# Patient Record
Sex: Male | Born: 1935 | Race: White | Hispanic: No | Smoking: Former smoker
Health system: Southern US, Community
[De-identification: ages and names within clinical notes are randomized; demographics above are authoritative.]

## PROBLEM LIST (undated history)

## (undated) DIAGNOSIS — K259 Gastric ulcer, unspecified as acute or chronic, without hemorrhage or perforation: Secondary | ICD-10-CM

## (undated) DIAGNOSIS — I219 Acute myocardial infarction, unspecified: Secondary | ICD-10-CM

## (undated) DIAGNOSIS — I10 Essential (primary) hypertension: Secondary | ICD-10-CM

## (undated) DIAGNOSIS — I714 Abdominal aortic aneurysm, without rupture, unspecified: Secondary | ICD-10-CM

## (undated) DIAGNOSIS — I77810 Thoracic aortic ectasia: Secondary | ICD-10-CM

## (undated) DIAGNOSIS — Z720 Tobacco use: Secondary | ICD-10-CM

## (undated) DIAGNOSIS — J449 Chronic obstructive pulmonary disease, unspecified: Secondary | ICD-10-CM

## (undated) DIAGNOSIS — K631 Perforation of intestine (nontraumatic): Secondary | ICD-10-CM

## (undated) DIAGNOSIS — Z86718 Personal history of other venous thrombosis and embolism: Secondary | ICD-10-CM

## (undated) DIAGNOSIS — D45 Polycythemia vera: Secondary | ICD-10-CM

## (undated) DIAGNOSIS — I255 Ischemic cardiomyopathy: Secondary | ICD-10-CM

## (undated) DIAGNOSIS — K219 Gastro-esophageal reflux disease without esophagitis: Secondary | ICD-10-CM

## (undated) DIAGNOSIS — K659 Peritonitis, unspecified: Secondary | ICD-10-CM

## (undated) DIAGNOSIS — I2510 Atherosclerotic heart disease of native coronary artery without angina pectoris: Secondary | ICD-10-CM

## (undated) DIAGNOSIS — I724 Aneurysm of artery of lower extremity: Secondary | ICD-10-CM

## (undated) DIAGNOSIS — I5022 Chronic systolic (congestive) heart failure: Secondary | ICD-10-CM

## (undated) DIAGNOSIS — J189 Pneumonia, unspecified organism: Secondary | ICD-10-CM

## (undated) DIAGNOSIS — I2699 Other pulmonary embolism without acute cor pulmonale: Secondary | ICD-10-CM

## (undated) DIAGNOSIS — J9610 Chronic respiratory failure, unspecified whether with hypoxia or hypercapnia: Secondary | ICD-10-CM

## (undated) HISTORY — DX: Aneurysm of artery of lower extremity: I72.4

## (undated) HISTORY — DX: Thoracic aortic ectasia: I77.810

## (undated) HISTORY — DX: Other pulmonary embolism without acute cor pulmonale: I26.99

## (undated) HISTORY — DX: Atherosclerotic heart disease of native coronary artery without angina pectoris: I25.10

## (undated) HISTORY — PX: SHOULDER ARTHROSCOPY: SHX128

## (undated) HISTORY — PX: COLON SURGERY: SHX602

## (undated) HISTORY — DX: Perforation of intestine (nontraumatic): K63.1

## (undated) HISTORY — DX: Abdominal aortic aneurysm, without rupture: I71.4

## (undated) HISTORY — DX: Gastric ulcer, unspecified as acute or chronic, without hemorrhage or perforation: K25.9

## (undated) HISTORY — PX: CARDIAC DEFIBRILLATOR PLACEMENT: SHX171

## (undated) HISTORY — DX: Abdominal aortic aneurysm, without rupture, unspecified: I71.40

## (undated) HISTORY — DX: Peritonitis, unspecified: K65.9

## (undated) HISTORY — DX: Chronic respiratory failure, unspecified whether with hypoxia or hypercapnia: J96.10

## (undated) HISTORY — DX: Tobacco use: Z72.0

## (undated) HISTORY — DX: Pneumonia, unspecified organism: J18.9

## (undated) HISTORY — DX: Polycythemia vera: D45

## (undated) HISTORY — DX: Chronic systolic (congestive) heart failure: I50.22

---

## 1998-09-17 ENCOUNTER — Ambulatory Visit (HOSPITAL_COMMUNITY): Admission: RE | Admit: 1998-09-17 | Discharge: 1998-09-17 | Payer: Self-pay | Admitting: *Deleted

## 1999-06-29 ENCOUNTER — Encounter: Payer: Self-pay | Admitting: Critical Care Medicine

## 1999-06-29 ENCOUNTER — Ambulatory Visit (HOSPITAL_COMMUNITY): Admission: RE | Admit: 1999-06-29 | Discharge: 1999-06-29 | Payer: Self-pay | Admitting: Critical Care Medicine

## 1999-08-11 ENCOUNTER — Ambulatory Visit (HOSPITAL_COMMUNITY): Admission: RE | Admit: 1999-08-11 | Discharge: 1999-08-11 | Payer: Self-pay | Admitting: Oncology

## 2000-04-02 ENCOUNTER — Emergency Department (HOSPITAL_COMMUNITY): Admission: EM | Admit: 2000-04-02 | Discharge: 2000-04-02 | Payer: Self-pay | Admitting: Emergency Medicine

## 2000-07-08 ENCOUNTER — Encounter: Payer: Self-pay | Admitting: Vascular Surgery

## 2000-07-08 ENCOUNTER — Emergency Department (HOSPITAL_COMMUNITY): Admission: EM | Admit: 2000-07-08 | Discharge: 2000-07-08 | Payer: Self-pay | Admitting: Emergency Medicine

## 2000-07-08 ENCOUNTER — Ambulatory Visit (HOSPITAL_COMMUNITY): Admission: RE | Admit: 2000-07-08 | Discharge: 2000-07-08 | Payer: Self-pay

## 2003-10-13 ENCOUNTER — Encounter (INDEPENDENT_AMBULATORY_CARE_PROVIDER_SITE_OTHER): Payer: Self-pay | Admitting: Specialist

## 2003-10-13 ENCOUNTER — Ambulatory Visit (HOSPITAL_COMMUNITY): Admission: RE | Admit: 2003-10-13 | Discharge: 2003-10-13 | Payer: Self-pay | Admitting: Gastroenterology

## 2004-05-20 ENCOUNTER — Ambulatory Visit (HOSPITAL_COMMUNITY): Admission: RE | Admit: 2004-05-20 | Discharge: 2004-05-20 | Payer: Self-pay | Admitting: Oncology

## 2004-06-02 ENCOUNTER — Ambulatory Visit (HOSPITAL_COMMUNITY): Admission: RE | Admit: 2004-06-02 | Discharge: 2004-06-02 | Payer: Self-pay | Admitting: Oncology

## 2004-10-13 ENCOUNTER — Ambulatory Visit: Payer: Self-pay | Admitting: Oncology

## 2004-12-08 ENCOUNTER — Ambulatory Visit: Payer: Self-pay | Admitting: Oncology

## 2005-02-01 ENCOUNTER — Ambulatory Visit: Payer: Self-pay | Admitting: Oncology

## 2005-03-21 ENCOUNTER — Ambulatory Visit: Payer: Self-pay | Admitting: Oncology

## 2005-05-06 ENCOUNTER — Ambulatory Visit: Payer: Self-pay | Admitting: Oncology

## 2005-07-12 ENCOUNTER — Ambulatory Visit: Payer: Self-pay | Admitting: Oncology

## 2005-08-29 ENCOUNTER — Ambulatory Visit: Payer: Self-pay | Admitting: Oncology

## 2005-10-27 ENCOUNTER — Ambulatory Visit: Payer: Self-pay | Admitting: Oncology

## 2005-12-14 ENCOUNTER — Ambulatory Visit: Payer: Self-pay | Admitting: Oncology

## 2006-02-09 ENCOUNTER — Ambulatory Visit: Payer: Self-pay | Admitting: Oncology

## 2006-02-10 LAB — CBC WITH DIFFERENTIAL/PLATELET
Basophils Absolute: 0.1 10*3/uL (ref 0.0–0.1)
Eosinophils Absolute: 0.1 10*3/uL (ref 0.0–0.5)
HGB: 15.3 g/dL (ref 13.0–17.1)
LYMPH%: 11.8 % — ABNORMAL LOW (ref 14.0–48.0)
MCV: 71 fL — ABNORMAL LOW (ref 81.6–98.0)
MONO#: 0.8 10*3/uL (ref 0.1–0.9)
MONO%: 6.6 % (ref 0.0–13.0)
NEUT#: 10 10*3/uL — ABNORMAL HIGH (ref 1.5–6.5)
Platelets: 226 10*3/uL (ref 145–400)
RBC: 6.73 10*6/uL — ABNORMAL HIGH (ref 4.20–5.71)
RDW: 16.6 % — ABNORMAL HIGH (ref 11.2–14.6)
WBC: 12.5 10*3/uL — ABNORMAL HIGH (ref 4.0–10.0)

## 2006-02-10 LAB — PROTIME-INR
INR: 1.2 — ABNORMAL LOW (ref 2.00–3.50)
Protime: 13.4 Seconds (ref 10.6–13.4)

## 2006-02-17 LAB — CBC WITH DIFFERENTIAL/PLATELET
BASO%: 0.7 % (ref 0.0–2.0)
Basophils Absolute: 0.1 10*3/uL (ref 0.0–0.1)
EOS%: 1.3 % (ref 0.0–7.0)
HCT: 51 % — ABNORMAL HIGH (ref 38.7–49.9)
HGB: 16.5 g/dL (ref 13.0–17.1)
LYMPH%: 16.7 % (ref 14.0–48.0)
MCH: 22.8 pg — ABNORMAL LOW (ref 28.0–33.4)
MCHC: 32.3 g/dL (ref 32.0–35.9)
MONO#: 0.8 10*3/uL (ref 0.1–0.9)
NEUT%: 73.7 % (ref 40.0–75.0)
Platelets: 222 10*3/uL (ref 145–400)
lymph#: 1.7 10*3/uL (ref 0.9–3.3)

## 2006-02-24 LAB — CBC WITH DIFFERENTIAL/PLATELET
BASO%: 0.4 % (ref 0.0–2.0)
Basophils Absolute: 0.1 10*3/uL (ref 0.0–0.1)
EOS%: 1.3 % (ref 0.0–7.0)
HCT: 50.9 % — ABNORMAL HIGH (ref 38.7–49.9)
HGB: 16.4 g/dL (ref 13.0–17.1)
MCH: 22.7 pg — ABNORMAL LOW (ref 28.0–33.4)
MCHC: 32.3 g/dL (ref 32.0–35.9)
MCV: 70.2 fL — ABNORMAL LOW (ref 81.6–98.0)
MONO%: 6.8 % (ref 0.0–13.0)
NEUT%: 76.8 % — ABNORMAL HIGH (ref 40.0–75.0)
lymph#: 2 10*3/uL (ref 0.9–3.3)

## 2006-03-03 LAB — CBC WITH DIFFERENTIAL/PLATELET
Basophils Absolute: 0.1 10*3/uL (ref 0.0–0.1)
Eosinophils Absolute: 0.1 10*3/uL (ref 0.0–0.5)
HGB: 16.1 g/dL (ref 13.0–17.1)
MONO#: 0.9 10*3/uL (ref 0.1–0.9)
NEUT#: 4.9 10*3/uL (ref 1.5–6.5)
RBC: 7.03 10*6/uL — ABNORMAL HIGH (ref 4.20–5.71)
RDW: 16.7 % — ABNORMAL HIGH (ref 11.2–14.6)
WBC: 7.3 10*3/uL (ref 4.0–10.0)

## 2006-03-03 LAB — PROTIME-INR
INR: 1.4 — ABNORMAL LOW (ref 2.00–3.50)
Protime: 14.8 Seconds — ABNORMAL HIGH (ref 10.6–13.4)

## 2006-03-10 LAB — CBC WITH DIFFERENTIAL/PLATELET
Basophils Absolute: 0.3 10*3/uL — ABNORMAL HIGH (ref 0.0–0.1)
EOS%: 1.8 % (ref 0.0–7.0)
Eosinophils Absolute: 0.2 10*3/uL (ref 0.0–0.5)
HGB: 16.3 g/dL (ref 13.0–17.1)
LYMPH%: 11.8 % — ABNORMAL LOW (ref 14.0–48.0)
MCH: 22.3 pg — ABNORMAL LOW (ref 28.0–33.4)
MCV: 71.3 fL — ABNORMAL LOW (ref 81.6–98.0)
MONO%: 11 % (ref 0.0–13.0)
NEUT#: 6.8 10*3/uL — ABNORMAL HIGH (ref 1.5–6.5)
Platelets: 197 10*3/uL (ref 145–400)
RBC: 7.28 10*6/uL — ABNORMAL HIGH (ref 4.20–5.71)

## 2006-03-17 LAB — CBC WITH DIFFERENTIAL/PLATELET
Basophils Absolute: 0.1 10*3/uL (ref 0.0–0.1)
Eosinophils Absolute: 0.1 10*3/uL (ref 0.0–0.5)
HGB: 16.1 g/dL (ref 13.0–17.1)
MONO#: 0.9 10*3/uL (ref 0.1–0.9)
NEUT#: 6.8 10*3/uL — ABNORMAL HIGH (ref 1.5–6.5)
RDW: 17.3 % — ABNORMAL HIGH (ref 11.2–14.6)
lymph#: 1.8 10*3/uL (ref 0.9–3.3)

## 2006-03-17 LAB — PROTIME-INR: INR: 3.1 (ref 2.00–3.50)

## 2006-03-24 LAB — CBC WITH DIFFERENTIAL/PLATELET
BASO%: 0.7 % (ref 0.0–2.0)
MCHC: 31.1 g/dL — ABNORMAL LOW (ref 32.0–35.9)
MONO#: 0.8 10*3/uL (ref 0.1–0.9)
RBC: 7.29 10*6/uL — ABNORMAL HIGH (ref 4.20–5.71)
WBC: 9.7 10*3/uL (ref 4.0–10.0)
lymph#: 2 10*3/uL (ref 0.9–3.3)

## 2006-03-24 LAB — PROTIME-INR
INR: 3.4 (ref 2.00–3.50)
Protime: 22.3 Seconds — ABNORMAL HIGH (ref 10.6–13.4)

## 2006-03-29 ENCOUNTER — Ambulatory Visit: Payer: Self-pay | Admitting: Oncology

## 2006-03-31 LAB — PROTIME-INR: Protime: 21.3 Seconds — ABNORMAL HIGH (ref 10.6–13.4)

## 2006-03-31 LAB — CBC WITH DIFFERENTIAL/PLATELET
BASO%: 0.4 % (ref 0.0–2.0)
LYMPH%: 21.1 % (ref 14.0–48.0)
MCHC: 31.9 g/dL — ABNORMAL LOW (ref 32.0–35.9)
MONO#: 0.8 10*3/uL (ref 0.1–0.9)
RBC: 7.33 10*6/uL — ABNORMAL HIGH (ref 4.20–5.71)
WBC: 8.7 10*3/uL (ref 4.0–10.0)
lymph#: 1.8 10*3/uL (ref 0.9–3.3)

## 2006-04-05 LAB — CBC WITH DIFFERENTIAL/PLATELET
BASO%: 0.7 % (ref 0.0–2.0)
EOS%: 0.8 % (ref 0.0–7.0)
LYMPH%: 11.9 % — ABNORMAL LOW (ref 14.0–48.0)
MCHC: 31 g/dL — ABNORMAL LOW (ref 32.0–35.9)
MONO#: 0.7 10*3/uL (ref 0.1–0.9)
Platelets: 205 10*3/uL (ref 145–400)
RBC: 7.32 10*6/uL — ABNORMAL HIGH (ref 4.20–5.71)
WBC: 10.2 10*3/uL — ABNORMAL HIGH (ref 4.0–10.0)

## 2006-04-05 LAB — PROTIME-INR

## 2006-04-07 LAB — PROTIME-INR
INR: 2.5 (ref 2.00–3.50)
Protime: 19.2 Seconds — ABNORMAL HIGH (ref 10.6–13.4)

## 2006-04-07 LAB — CBC WITH DIFFERENTIAL/PLATELET
BASO%: 0.7 % (ref 0.0–2.0)
EOS%: 2.3 % (ref 0.0–7.0)
HCT: 50.9 % — ABNORMAL HIGH (ref 38.7–49.9)
LYMPH%: 21.5 % (ref 14.0–48.0)
MCH: 22.8 pg — ABNORMAL LOW (ref 28.0–33.4)
MCHC: 31.3 g/dL — ABNORMAL LOW (ref 32.0–35.9)
NEUT%: 66 % (ref 40.0–75.0)
Platelets: 204 10*3/uL (ref 145–400)

## 2006-04-12 LAB — CBC WITH DIFFERENTIAL/PLATELET
BASO%: 0.8 % (ref 0.0–2.0)
EOS%: 1.3 % (ref 0.0–7.0)
HCT: 51 % — ABNORMAL HIGH (ref 38.7–49.9)
LYMPH%: 12.5 % — ABNORMAL LOW (ref 14.0–48.0)
MCH: 22.8 pg — ABNORMAL LOW (ref 28.0–33.4)
MCHC: 31.5 g/dL — ABNORMAL LOW (ref 32.0–35.9)
MONO#: 0.7 10*3/uL (ref 0.1–0.9)
NEUT%: 78.9 % — ABNORMAL HIGH (ref 40.0–75.0)
RBC: 7.04 10*6/uL — ABNORMAL HIGH (ref 4.20–5.71)
WBC: 10 10*3/uL (ref 4.0–10.0)
lymph#: 1.2 10*3/uL (ref 0.9–3.3)

## 2006-04-12 LAB — PROTIME-INR: Protime: 19.8 Seconds — ABNORMAL HIGH (ref 10.6–13.4)

## 2006-04-19 LAB — CBC WITH DIFFERENTIAL/PLATELET
BASO%: 0.9 % (ref 0.0–2.0)
EOS%: 1.6 % (ref 0.0–7.0)
HCT: 46.6 % (ref 38.7–49.9)
HGB: 14.9 g/dL (ref 13.0–17.1)
MCH: 23.7 pg — ABNORMAL LOW (ref 28.0–33.4)
MCHC: 32 g/dL (ref 32.0–35.9)
MONO#: 0.8 10*3/uL (ref 0.1–0.9)
RDW: 18.3 % — ABNORMAL HIGH (ref 11.2–14.6)
WBC: 10.3 10*3/uL — ABNORMAL HIGH (ref 4.0–10.0)
lymph#: 1.6 10*3/uL (ref 0.9–3.3)

## 2006-04-19 LAB — PROTIME-INR
INR: 1.1 — ABNORMAL LOW (ref 2.00–3.50)
Protime: 12.9 Seconds (ref 10.6–13.4)

## 2006-04-26 LAB — CBC WITH DIFFERENTIAL/PLATELET
Basophils Absolute: 0 10*3/uL (ref 0.0–0.1)
Eosinophils Absolute: 0.1 10*3/uL (ref 0.0–0.5)
HCT: 45.9 % (ref 38.7–49.9)
HGB: 14.6 g/dL (ref 13.0–17.1)
MCH: 23.7 pg — ABNORMAL LOW (ref 28.0–33.4)
MCV: 74.5 fL — ABNORMAL LOW (ref 81.6–98.0)
MONO%: 6 % (ref 0.0–13.0)
NEUT#: 6.2 10*3/uL (ref 1.5–6.5)
NEUT%: 76.4 % — ABNORMAL HIGH (ref 40.0–75.0)
RDW: 17.1 % — ABNORMAL HIGH (ref 11.2–14.6)
WBC: 8.1 10*3/uL (ref 4.0–10.0)

## 2006-04-26 LAB — PROTIME-INR: Protime: 19.8 Seconds — ABNORMAL HIGH (ref 10.6–13.4)

## 2006-06-27 ENCOUNTER — Ambulatory Visit: Payer: Self-pay | Admitting: Oncology

## 2006-06-29 LAB — CBC WITH DIFFERENTIAL/PLATELET
BASO%: 0.6 % (ref 0.0–2.0)
EOS%: 1.3 % (ref 0.0–7.0)
HGB: 15.7 g/dL (ref 13.0–17.1)
MCH: 23 pg — ABNORMAL LOW (ref 28.0–33.4)
MCHC: 30.8 g/dL — ABNORMAL LOW (ref 32.0–35.9)
RDW: 15.8 % — ABNORMAL HIGH (ref 11.2–14.6)
lymph#: 2.2 10*3/uL (ref 0.9–3.3)

## 2006-06-29 LAB — PROTIME-INR: INR: 1.5 — ABNORMAL LOW (ref 2.00–3.50)

## 2006-08-16 ENCOUNTER — Ambulatory Visit: Payer: Self-pay | Admitting: Oncology

## 2006-09-07 ENCOUNTER — Encounter: Payer: Self-pay | Admitting: Vascular Surgery

## 2006-09-07 ENCOUNTER — Ambulatory Visit (HOSPITAL_COMMUNITY): Admission: RE | Admit: 2006-09-07 | Discharge: 2006-09-07 | Payer: Self-pay | Admitting: Internal Medicine

## 2006-09-08 LAB — PROTIME-INR: INR: 1.1 — ABNORMAL LOW (ref 2.00–3.50)

## 2006-09-08 LAB — CBC WITH DIFFERENTIAL/PLATELET
BASO%: 0.7 % (ref 0.0–2.0)
EOS%: 2.5 % (ref 0.0–7.0)
MCH: 23.6 pg — ABNORMAL LOW (ref 28.0–33.4)
MCHC: 31.7 g/dL — ABNORMAL LOW (ref 32.0–35.9)
MCV: 74.4 fL — ABNORMAL LOW (ref 81.6–98.0)
MONO%: 10 % (ref 0.0–13.0)
NEUT%: 71.7 % (ref 40.0–75.0)
RDW: 18 % — ABNORMAL HIGH (ref 11.2–14.6)
lymph#: 1.9 10*3/uL (ref 0.9–3.3)

## 2006-09-12 LAB — CBC WITH DIFFERENTIAL/PLATELET
BASO%: 0.8 % (ref 0.0–2.0)
Basophils Absolute: 0.1 10*3/uL (ref 0.0–0.1)
EOS%: 1.7 % (ref 0.0–7.0)
HCT: 54.4 % — ABNORMAL HIGH (ref 38.7–49.9)
LYMPH%: 15.8 % (ref 14.0–48.0)
MCH: 24 pg — ABNORMAL LOW (ref 28.0–33.4)
MCHC: 32 g/dL (ref 32.0–35.9)
MCV: 74.9 fL — ABNORMAL LOW (ref 81.6–98.0)
MONO%: 7.6 % (ref 0.0–13.0)
NEUT%: 74.1 % (ref 40.0–75.0)
lymph#: 1.7 10*3/uL (ref 0.9–3.3)

## 2006-09-12 LAB — PROTIME-INR

## 2006-09-14 LAB — PROTIME-INR

## 2006-09-14 LAB — CBC WITH DIFFERENTIAL/PLATELET
BASO%: 0.8 % (ref 0.0–2.0)
Basophils Absolute: 0.1 10*3/uL (ref 0.0–0.1)
EOS%: 1.5 % (ref 0.0–7.0)
HCT: 53.6 % — ABNORMAL HIGH (ref 38.7–49.9)
HGB: 17.1 g/dL (ref 13.0–17.1)
LYMPH%: 17.8 % (ref 14.0–48.0)
MCH: 23.8 pg — ABNORMAL LOW (ref 28.0–33.4)
MCHC: 31.8 g/dL — ABNORMAL LOW (ref 32.0–35.9)
MCV: 74.8 fL — ABNORMAL LOW (ref 81.6–98.0)
MONO%: 6.5 % (ref 0.0–13.0)
NEUT%: 73.4 % (ref 40.0–75.0)
Platelets: 256 10*3/uL (ref 145–400)

## 2006-09-15 LAB — PROTIME-INR

## 2006-09-22 LAB — CBC WITH DIFFERENTIAL/PLATELET
BASO%: 0.3 % (ref 0.0–2.0)
Eosinophils Absolute: 0.2 10*3/uL (ref 0.0–0.5)
HCT: 51.4 % — ABNORMAL HIGH (ref 38.7–49.9)
LYMPH%: 12 % — ABNORMAL LOW (ref 14.0–48.0)
MCHC: 31.4 g/dL — ABNORMAL LOW (ref 32.0–35.9)
MONO#: 0.8 10*3/uL (ref 0.1–0.9)
NEUT#: 11.3 10*3/uL — ABNORMAL HIGH (ref 1.5–6.5)
NEUT%: 80.6 % — ABNORMAL HIGH (ref 40.0–75.0)
Platelets: 281 10*3/uL (ref 145–400)
WBC: 14 10*3/uL — ABNORMAL HIGH (ref 4.0–10.0)
lymph#: 1.7 10*3/uL (ref 0.9–3.3)

## 2006-09-22 LAB — PROTIME-INR: Protime: 33.6 Seconds — ABNORMAL HIGH (ref 10.6–13.4)

## 2006-09-27 LAB — CBC WITH DIFFERENTIAL/PLATELET
Basophils Absolute: 0.2 10*3/uL — ABNORMAL HIGH (ref 0.0–0.1)
EOS%: 2.6 % (ref 0.0–7.0)
HCT: 48.6 % (ref 38.7–49.9)
LYMPH%: 16.1 % (ref 14.0–48.0)
MCH: 24.6 pg — ABNORMAL LOW (ref 28.0–33.4)
MONO%: 11 % (ref 0.0–13.0)
NEUT#: 6.1 10*3/uL (ref 1.5–6.5)
NEUT%: 68.4 % (ref 40.0–75.0)
RDW: 17.2 % — ABNORMAL HIGH (ref 11.2–14.6)
WBC: 9 10*3/uL (ref 4.0–10.0)

## 2006-09-27 LAB — PROTIME-INR: INR: 3.2 (ref 2.00–3.50)

## 2006-10-02 ENCOUNTER — Ambulatory Visit: Payer: Self-pay | Admitting: Oncology

## 2006-10-02 LAB — CBC WITH DIFFERENTIAL/PLATELET
Basophils Absolute: 0.1 10*3/uL (ref 0.0–0.1)
EOS%: 2.3 % (ref 0.0–7.0)
Eosinophils Absolute: 0.2 10*3/uL (ref 0.0–0.5)
HCT: 50.8 % — ABNORMAL HIGH (ref 38.7–49.9)
HGB: 15.9 g/dL (ref 13.0–17.1)
MCH: 23.9 pg — ABNORMAL LOW (ref 28.0–33.4)
MCV: 76.5 fL — ABNORMAL LOW (ref 81.6–98.0)
MONO%: 8.8 % (ref 0.0–13.0)
NEUT%: 68.3 % (ref 40.0–75.0)
Platelets: 245 10*3/uL (ref 145–400)

## 2006-10-06 LAB — PROTIME-INR: INR: 2.4 (ref 2.00–3.50)

## 2006-10-06 LAB — CBC WITH DIFFERENTIAL/PLATELET
Basophils Absolute: 0 10*3/uL (ref 0.0–0.1)
EOS%: 1.4 % (ref 0.0–7.0)
Eosinophils Absolute: 0.1 10*3/uL (ref 0.0–0.5)
HCT: 50.3 % — ABNORMAL HIGH (ref 38.7–49.9)
HGB: 16.1 g/dL (ref 13.0–17.1)
MCH: 24.6 pg — ABNORMAL LOW (ref 28.0–33.4)
MCV: 77 fL — ABNORMAL LOW (ref 81.6–98.0)
MONO%: 6 % (ref 0.0–13.0)
NEUT#: 6.8 10*3/uL — ABNORMAL HIGH (ref 1.5–6.5)
NEUT%: 74.3 % (ref 40.0–75.0)
lymph#: 1.6 10*3/uL (ref 0.9–3.3)

## 2006-10-13 LAB — CBC WITH DIFFERENTIAL/PLATELET
BASO%: 0.8 % (ref 0.0–2.0)
EOS%: 1.5 % (ref 0.0–7.0)
MCH: 24 pg — ABNORMAL LOW (ref 28.0–33.4)
MCHC: 31.2 g/dL — ABNORMAL LOW (ref 32.0–35.9)
MONO%: 6.8 % (ref 0.0–13.0)
RBC: 6.06 10*6/uL — ABNORMAL HIGH (ref 4.20–5.71)
RDW: 16.1 % — ABNORMAL HIGH (ref 11.2–14.6)
lymph#: 1.8 10*3/uL (ref 0.9–3.3)

## 2006-10-13 LAB — PROTIME-INR: INR: 1.2 — ABNORMAL LOW (ref 2.00–3.50)

## 2006-11-10 LAB — CBC WITH DIFFERENTIAL/PLATELET
BASO%: 0.6 % (ref 0.0–2.0)
Basophils Absolute: 0.1 10*3/uL (ref 0.0–0.1)
EOS%: 1.2 % (ref 0.0–7.0)
HCT: 47.9 % (ref 38.7–49.9)
HGB: 15 g/dL (ref 13.0–17.1)
LYMPH%: 20.7 % (ref 14.0–48.0)
MCH: 23.5 pg — ABNORMAL LOW (ref 28.0–33.4)
MCHC: 31.3 g/dL — ABNORMAL LOW (ref 32.0–35.9)
MCV: 75.1 fL — ABNORMAL LOW (ref 81.6–98.0)
MONO%: 8.3 % (ref 0.0–13.0)
NEUT%: 69.2 % (ref 40.0–75.0)
Platelets: 239 10*3/uL (ref 145–400)
lymph#: 1.8 10*3/uL (ref 0.9–3.3)

## 2006-11-10 LAB — PROTIME-INR

## 2006-11-17 ENCOUNTER — Ambulatory Visit: Payer: Self-pay | Admitting: Oncology

## 2006-11-17 LAB — CBC WITH DIFFERENTIAL/PLATELET
Basophils Absolute: 0.1 10*3/uL (ref 0.0–0.1)
EOS%: 1.6 % (ref 0.0–7.0)
Eosinophils Absolute: 0.2 10*3/uL (ref 0.0–0.5)
HGB: 15.1 g/dL (ref 13.0–17.1)
LYMPH%: 18.2 % (ref 14.0–48.0)
MCH: 23.5 pg — ABNORMAL LOW (ref 28.0–33.4)
MCV: 74.5 fL — ABNORMAL LOW (ref 81.6–98.0)
MONO%: 7.3 % (ref 0.0–13.0)
NEUT#: 7 10*3/uL — ABNORMAL HIGH (ref 1.5–6.5)
NEUT%: 71.9 % (ref 40.0–75.0)
Platelets: 221 10*3/uL (ref 145–400)
RDW: 14.1 % (ref 11.2–14.6)

## 2006-12-12 LAB — CBC WITH DIFFERENTIAL/PLATELET
Basophils Absolute: 0.1 10*3/uL (ref 0.0–0.1)
EOS%: 1.9 % (ref 0.0–7.0)
Eosinophils Absolute: 0.2 10*3/uL (ref 0.0–0.5)
HCT: 50.7 % — ABNORMAL HIGH (ref 38.7–49.9)
HGB: 15.7 g/dL (ref 13.0–17.1)
LYMPH%: 23.2 % (ref 14.0–48.0)
MCH: 23.3 pg — ABNORMAL LOW (ref 28.0–33.4)
MCV: 75.3 fL — ABNORMAL LOW (ref 81.6–98.0)
MONO%: 7.4 % (ref 0.0–13.0)
NEUT#: 6.5 10*3/uL (ref 1.5–6.5)
NEUT%: 66.8 % (ref 40.0–75.0)
Platelets: 253 10*3/uL (ref 145–400)
RDW: 13.9 % (ref 11.2–14.6)

## 2006-12-12 LAB — PROTIME-INR: INR: 3.2 (ref 2.00–3.50)

## 2007-01-02 ENCOUNTER — Ambulatory Visit: Payer: Self-pay | Admitting: Oncology

## 2007-03-14 ENCOUNTER — Ambulatory Visit: Payer: Self-pay | Admitting: Oncology

## 2007-03-14 LAB — CBC WITH DIFFERENTIAL/PLATELET
Basophils Absolute: 0.1 10*3/uL (ref 0.0–0.1)
Eosinophils Absolute: 0.1 10*3/uL (ref 0.0–0.5)
HCT: 56.7 % — ABNORMAL HIGH (ref 38.7–49.9)
HGB: 17.8 g/dL — ABNORMAL HIGH (ref 13.0–17.1)
MCV: 77.1 fL — ABNORMAL LOW (ref 81.6–98.0)
MONO%: 8.8 % (ref 0.0–13.0)
NEUT#: 6.1 10*3/uL (ref 1.5–6.5)
NEUT%: 73.2 % (ref 40.0–75.0)
RDW: 16.4 % — ABNORMAL HIGH (ref 11.2–14.6)
lymph#: 1.3 10*3/uL (ref 0.9–3.3)

## 2007-03-14 LAB — PROTIME-INR: INR: 1 — ABNORMAL LOW (ref 2.00–3.50)

## 2007-04-06 LAB — CBC WITH DIFFERENTIAL/PLATELET
Basophils Absolute: 0 10*3/uL (ref 0.0–0.1)
Eosinophils Absolute: 0.1 10*3/uL (ref 0.0–0.5)
HGB: 17.1 g/dL (ref 13.0–17.1)
NEUT#: 6.6 10*3/uL — ABNORMAL HIGH (ref 1.5–6.5)
RBC: 6.75 10*6/uL — ABNORMAL HIGH (ref 4.20–5.71)
RDW: 18.9 % — ABNORMAL HIGH (ref 11.2–14.6)
WBC: 8.5 10*3/uL (ref 4.0–10.0)
lymph#: 1.3 10*3/uL (ref 0.9–3.3)

## 2007-04-06 LAB — PROTIME-INR
INR: 1.7 — ABNORMAL LOW (ref 2.00–3.50)
Protime: 20.4 Seconds — ABNORMAL HIGH (ref 10.6–13.4)

## 2007-04-25 LAB — CBC WITH DIFFERENTIAL/PLATELET
Basophils Absolute: 0.1 10*3/uL (ref 0.0–0.1)
EOS%: 1 % (ref 0.0–7.0)
Eosinophils Absolute: 0.1 10*3/uL (ref 0.0–0.5)
HGB: 18.4 g/dL — ABNORMAL HIGH (ref 13.0–17.1)
LYMPH%: 13.9 % — ABNORMAL LOW (ref 14.0–48.0)
MCH: 25 pg — ABNORMAL LOW (ref 28.0–33.4)
MCV: 77 fL — ABNORMAL LOW (ref 81.6–98.0)
MONO%: 5.7 % (ref 0.0–13.0)
Platelets: 202 10*3/uL (ref 145–400)
RDW: 14.9 % — ABNORMAL HIGH (ref 11.2–14.6)

## 2007-04-25 LAB — PROTIME-INR
INR: 4.2 — ABNORMAL HIGH (ref 2.00–3.50)
Protime: 50.4 Seconds — ABNORMAL HIGH (ref 10.6–13.4)

## 2007-04-26 ENCOUNTER — Ambulatory Visit: Payer: Self-pay | Admitting: Oncology

## 2007-05-01 LAB — CBC WITH DIFFERENTIAL/PLATELET
BASO%: 0.7 % (ref 0.0–2.0)
Basophils Absolute: 0.1 10*3/uL (ref 0.0–0.1)
EOS%: 1.5 % (ref 0.0–7.0)
HCT: 50.9 % — ABNORMAL HIGH (ref 38.7–49.9)
HGB: 16.3 g/dL (ref 13.0–17.1)
LYMPH%: 15.5 % (ref 14.0–48.0)
MCH: 24.3 pg — ABNORMAL LOW (ref 28.0–33.4)
MCHC: 31.9 g/dL — ABNORMAL LOW (ref 32.0–35.9)
NEUT%: 75.7 % — ABNORMAL HIGH (ref 40.0–75.0)
Platelets: 178 10*3/uL (ref 145–400)
lymph#: 1.4 10*3/uL (ref 0.9–3.3)

## 2007-05-01 LAB — PROTIME-INR

## 2007-05-02 LAB — CBC WITH DIFFERENTIAL/PLATELET
Basophils Absolute: 0 10*3/uL (ref 0.0–0.1)
HCT: 46.9 % (ref 38.7–49.9)
HGB: 15.3 g/dL (ref 13.0–17.1)
LYMPH%: 16.7 % (ref 14.0–48.0)
MCH: 24.8 pg — ABNORMAL LOW (ref 28.0–33.4)
MCHC: 32.6 g/dL (ref 32.0–35.9)
MONO#: 0.5 10*3/uL (ref 0.1–0.9)
NEUT%: 75.4 % — ABNORMAL HIGH (ref 40.0–75.0)
Platelets: 209 10*3/uL (ref 145–400)
WBC: 8.7 10*3/uL (ref 4.0–10.0)
lymph#: 1.5 10*3/uL (ref 0.9–3.3)

## 2007-05-02 LAB — PROTIME-INR

## 2007-05-08 LAB — CBC WITH DIFFERENTIAL/PLATELET
BASO%: 0.6 % (ref 0.0–2.0)
Basophils Absolute: 0.1 10*3/uL (ref 0.0–0.1)
Eosinophils Absolute: 0.1 10*3/uL (ref 0.0–0.5)
HCT: 41.2 % (ref 38.7–49.9)
HGB: 13.9 g/dL (ref 13.0–17.1)
LYMPH%: 12.1 % — ABNORMAL LOW (ref 14.0–48.0)
MONO#: 0.6 10*3/uL (ref 0.1–0.9)
NEUT#: 6.5 10*3/uL (ref 1.5–6.5)
NEUT%: 78.8 % — ABNORMAL HIGH (ref 40.0–75.0)
Platelets: 225 10*3/uL (ref 145–400)
WBC: 8.3 10*3/uL (ref 4.0–10.0)
lymph#: 1 10*3/uL (ref 0.9–3.3)

## 2007-05-08 LAB — PROTIME-INR

## 2007-05-23 LAB — PROTIME-INR
INR: 1.8 — ABNORMAL LOW (ref 2.00–3.50)
Protime: 21.6 Seconds — ABNORMAL HIGH (ref 10.6–13.4)

## 2007-05-23 LAB — CBC WITH DIFFERENTIAL/PLATELET
BASO%: 0.5 % (ref 0.0–2.0)
MCHC: 31.5 g/dL — ABNORMAL LOW (ref 32.0–35.9)
MONO#: 0.6 10*3/uL (ref 0.1–0.9)
RBC: 5.92 10*6/uL — ABNORMAL HIGH (ref 4.20–5.71)
WBC: 9.1 10*3/uL (ref 4.0–10.0)
lymph#: 1.4 10*3/uL (ref 0.9–3.3)

## 2007-06-04 ENCOUNTER — Ambulatory Visit: Payer: Self-pay | Admitting: Oncology

## 2007-06-05 LAB — CBC WITH DIFFERENTIAL/PLATELET
BASO%: 0.7 % (ref 0.0–2.0)
Basophils Absolute: 0.1 10*3/uL (ref 0.0–0.1)
HCT: 46.4 % (ref 38.7–49.9)
HGB: 14.8 g/dL (ref 13.0–17.1)
MCHC: 32 g/dL (ref 32.0–35.9)
MONO#: 0.5 10*3/uL (ref 0.1–0.9)
NEUT%: 76.2 % — ABNORMAL HIGH (ref 40.0–75.0)
WBC: 9 10*3/uL (ref 4.0–10.0)
lymph#: 1.4 10*3/uL (ref 0.9–3.3)

## 2007-06-05 LAB — PROTIME-INR: INR: 2.8 (ref 2.00–3.50)

## 2007-06-12 LAB — CBC WITH DIFFERENTIAL/PLATELET
BASO%: 1 % (ref 0.0–2.0)
HCT: 45.7 % (ref 38.7–49.9)
MCHC: 31.3 g/dL — ABNORMAL LOW (ref 32.0–35.9)
MONO#: 0.7 10*3/uL (ref 0.1–0.9)
NEUT%: 73.2 % (ref 40.0–75.0)
RDW: 13.3 % (ref 11.2–14.6)
WBC: 10 10*3/uL (ref 4.0–10.0)
lymph#: 1.8 10*3/uL (ref 0.9–3.3)

## 2007-06-12 LAB — PROTIME-INR

## 2007-06-19 LAB — CBC WITH DIFFERENTIAL/PLATELET
Basophils Absolute: 0.1 10*3/uL (ref 0.0–0.1)
EOS%: 1.7 % (ref 0.0–7.0)
Eosinophils Absolute: 0.2 10*3/uL (ref 0.0–0.5)
HCT: 43 % (ref 38.7–49.9)
HGB: 13.6 g/dL (ref 13.0–17.1)
MCH: 23.7 pg — ABNORMAL LOW (ref 28.0–33.4)
MONO#: 0.7 10*3/uL (ref 0.1–0.9)
NEUT#: 7.2 10*3/uL — ABNORMAL HIGH (ref 1.5–6.5)
NEUT%: 75.2 % — ABNORMAL HIGH (ref 40.0–75.0)
lymph#: 1.5 10*3/uL (ref 0.9–3.3)

## 2007-06-19 LAB — PROTIME-INR: INR: 3.9 — ABNORMAL HIGH (ref 2.00–3.50)

## 2007-06-26 LAB — CBC WITH DIFFERENTIAL/PLATELET
Basophils Absolute: 0.1 10*3/uL (ref 0.0–0.1)
HCT: 43.3 % (ref 38.7–49.9)
HGB: 13.5 g/dL (ref 13.0–17.1)
MONO#: 0.7 10*3/uL (ref 0.1–0.9)
NEUT#: 6.8 10*3/uL — ABNORMAL HIGH (ref 1.5–6.5)
NEUT%: 74.8 % (ref 40.0–75.0)
RDW: 12.9 % (ref 11.2–14.6)
WBC: 9 10*3/uL (ref 4.0–10.0)
lymph#: 1.4 10*3/uL (ref 0.9–3.3)

## 2007-06-26 LAB — PROTIME-INR: INR: 2.7 (ref 2.00–3.50)

## 2007-07-24 ENCOUNTER — Ambulatory Visit: Payer: Self-pay | Admitting: Oncology

## 2007-08-31 ENCOUNTER — Ambulatory Visit: Payer: Self-pay | Admitting: Oncology

## 2007-10-12 ENCOUNTER — Ambulatory Visit: Payer: Self-pay | Admitting: Oncology

## 2011-02-06 DIAGNOSIS — J189 Pneumonia, unspecified organism: Secondary | ICD-10-CM

## 2011-02-06 HISTORY — DX: Pneumonia, unspecified organism: J18.9

## 2011-03-04 ENCOUNTER — Emergency Department (HOSPITAL_COMMUNITY): Payer: Medicare Other

## 2011-03-04 ENCOUNTER — Inpatient Hospital Stay (HOSPITAL_COMMUNITY)
Admission: EM | Admit: 2011-03-04 | Discharge: 2011-03-07 | DRG: 194 | Disposition: A | Discharge status: Home / Self Care | Payer: Medicare Other | Attending: Infectious Diseases | Admitting: Infectious Diseases

## 2011-03-04 DIAGNOSIS — J189 Pneumonia, unspecified organism: Principal | ICD-10-CM | POA: Diagnosis present

## 2011-03-04 DIAGNOSIS — R0902 Hypoxemia: Secondary | ICD-10-CM | POA: Diagnosis present

## 2011-03-04 DIAGNOSIS — J42 Unspecified chronic bronchitis: Secondary | ICD-10-CM | POA: Diagnosis present

## 2011-03-04 DIAGNOSIS — E873 Alkalosis: Secondary | ICD-10-CM | POA: Diagnosis present

## 2011-03-04 DIAGNOSIS — Z8673 Personal history of transient ischemic attack (TIA), and cerebral infarction without residual deficits: Secondary | ICD-10-CM

## 2011-03-04 DIAGNOSIS — D751 Secondary polycythemia: Secondary | ICD-10-CM | POA: Diagnosis present

## 2011-03-04 DIAGNOSIS — F17200 Nicotine dependence, unspecified, uncomplicated: Secondary | ICD-10-CM | POA: Diagnosis present

## 2011-03-04 LAB — DIFFERENTIAL
Basophils Absolute: 0 10*3/uL (ref 0.0–0.1)
Basophils Relative: 0 % (ref 0–1)
Eosinophils Absolute: 0 10*3/uL (ref 0.0–0.7)
Eosinophils Relative: 0 % (ref 0–5)
Lymphocytes Relative: 9 % — ABNORMAL LOW (ref 12–46)
Lymphs Abs: 1.5 10*3/uL (ref 0.7–4.0)
Monocytes Absolute: 1.5 10*3/uL — ABNORMAL HIGH (ref 0.1–1.0)
Monocytes Relative: 9 % (ref 3–12)
Neutro Abs: 13.9 10*3/uL — ABNORMAL HIGH (ref 1.7–7.7)
Neutrophils Relative %: 82 % — ABNORMAL HIGH (ref 43–77)

## 2011-03-04 LAB — COMPREHENSIVE METABOLIC PANEL
ALT: 32 U/L (ref 0–53)
AST: 38 U/L — ABNORMAL HIGH (ref 0–37)
CO2: 24 mEq/L (ref 19–32)
Calcium: 9.3 mg/dL (ref 8.4–10.5)
Creatinine, Ser: 1.3 mg/dL (ref 0.4–1.5)
GFR calc Af Amer: >60 mL/min (ref >60)
GFR calc non Af Amer: 54 mL/min — ABNORMAL LOW (ref >60)
Sodium: 132 mEq/L — ABNORMAL LOW (ref 135–145)
Total Protein: 6.7 g/dL (ref 6.0–8.3)

## 2011-03-04 LAB — POCT I-STAT 3, ART BLOOD GAS (G3+)
Bicarbonate: 23.5 mEq/L (ref 20.0–24.0)
TCO2: 24 mmol/L (ref 0–100)
pH, Arterial: 7.453 — ABNORMAL HIGH (ref 7.350–7.450)
pO2, Arterial: 64 mmHg — ABNORMAL LOW (ref 80.0–100.0)

## 2011-03-04 LAB — PROTIME-INR
INR: 1.13 (ref 0.00–1.49)
Prothrombin Time: 14.7 seconds (ref 11.6–15.2)

## 2011-03-04 LAB — GRAM STAIN

## 2011-03-04 LAB — CBC
MCH: 29.7 pg (ref 26.0–34.0)
MCHC: 34.1 g/dL (ref 30.0–36.0)
RDW: 14 % (ref 11.5–15.5)

## 2011-03-04 LAB — BRAIN NATRIURETIC PEPTIDE: Pro B Natriuretic peptide (BNP): 78 pg/mL (ref 0.0–100.0)

## 2011-03-05 DIAGNOSIS — J189 Pneumonia, unspecified organism: Secondary | ICD-10-CM

## 2011-03-05 LAB — CBC
HCT: 55 % — ABNORMAL HIGH (ref 39.0–52.0)
Hemoglobin: 18.9 g/dL — ABNORMAL HIGH (ref 13.0–17.0)
MCV: 88.1 fL (ref 78.0–100.0)
RBC: 6.24 MIL/uL — ABNORMAL HIGH (ref 4.22–5.81)
WBC: 20.6 10*3/uL — ABNORMAL HIGH (ref 4.0–10.5)

## 2011-03-05 LAB — BASIC METABOLIC PANEL
BUN: 29 mg/dL — ABNORMAL HIGH (ref 6–23)
CO2: 26 mEq/L (ref 19–32)
Chloride: 98 mEq/L (ref 96–112)
GFR calc non Af Amer: >60 mL/min (ref >60)
Glucose, Bld: 121 mg/dL — ABNORMAL HIGH (ref 70–99)
Potassium: 4.7 mEq/L (ref 3.5–5.1)
Sodium: 132 mEq/L — ABNORMAL LOW (ref 135–145)

## 2011-03-05 LAB — APTT: aPTT: 30 seconds (ref 24–37)

## 2011-03-06 LAB — CBC
HCT: 52.2 % — ABNORMAL HIGH (ref 39.0–52.0)
Hemoglobin: 17.9 g/dL — ABNORMAL HIGH (ref 13.0–17.0)
MCH: 30.3 pg (ref 26.0–34.0)
MCHC: 34.3 g/dL (ref 30.0–36.0)
MCV: 88.5 fL (ref 78.0–100.0)
RDW: 14.1 % (ref 11.5–15.5)

## 2011-03-06 LAB — BASIC METABOLIC PANEL
Chloride: 93 mEq/L — ABNORMAL LOW (ref 96–112)
Creatinine, Ser: 1.13 mg/dL (ref 0.4–1.5)
GFR calc non Af Amer: >60 mL/min (ref >60)
Potassium: 3.9 mEq/L (ref 3.5–5.1)

## 2011-03-07 LAB — CBC
Hemoglobin: 17 g/dL (ref 13.0–17.0)
MCH: 29.6 pg (ref 26.0–34.0)
RBC: 5.74 MIL/uL (ref 4.22–5.81)
WBC: 15.9 10*3/uL — ABNORMAL HIGH (ref 4.0–10.5)

## 2011-03-07 LAB — CULTURE, RESPIRATORY W GRAM STAIN

## 2011-03-08 HISTORY — PX: CHOLECYSTECTOMY: SHX55

## 2011-03-11 ENCOUNTER — Ambulatory Visit (INDEPENDENT_AMBULATORY_CARE_PROVIDER_SITE_OTHER): Payer: Medicare Other | Admitting: Internal Medicine

## 2011-03-11 ENCOUNTER — Encounter: Payer: Self-pay | Admitting: Internal Medicine

## 2011-03-11 DIAGNOSIS — J42 Unspecified chronic bronchitis: Secondary | ICD-10-CM

## 2011-03-11 DIAGNOSIS — J189 Pneumonia, unspecified organism: Secondary | ICD-10-CM

## 2011-03-11 DIAGNOSIS — D45 Polycythemia vera: Secondary | ICD-10-CM

## 2011-03-11 LAB — CULTURE, BLOOD (ROUTINE X 2)
Culture: NO GROWTH
Culture: NO GROWTH

## 2011-03-11 NOTE — Assessment & Plan Note (Signed)
Completed total 7 days of antibiotic treatment and excellent clinical improvement. We'll followup chest x-ray after 4 weeks for radiology resolution.

## 2011-03-11 NOTE — Patient Instructions (Signed)
Please make follow up appointment in 4-6 weeks with Dr. Allena Katz. You have to have a chest x-ray done one or 2 days before you come to the clinic here. Please use the oxygen as needed at home, if you're walking around and feel short of breath feel free to use it. Use the inhaler if you feel severely short of breath . Please get the pulmonary function test done.

## 2011-03-11 NOTE — Assessment & Plan Note (Signed)
Never diagnosed with COPD, also had no wheezing during the last hospital admission for community acquired pneumonia. But was hypoxic in hospital and  has dyspnea on exertion. Was sent home on home oxygen which he used all the time. Checked his O2 sats being off oxygen after 5 minutes which was 94% and his ambulatory O2 sats 91%. So advised him to use oxygen only if he feels needed when he walks around more than usual or gets short of breath( of using albuterol inhaler at that point). - We'll set an appointment for outpatient PFTs- spirometry before and after bronchodilators and lung volumes. - Will followup in 4 weeks with results along with the chest x-ray result for pneumonia resolution.

## 2011-03-11 NOTE — Assessment & Plan Note (Signed)
He was started on aspirin the time of recent hospital discharge in April 2012. [facility], to reevaluate his PCV and consider starting phlebotomy if needed.

## 2011-03-11 NOTE — Progress Notes (Signed)
  Subjective:    Patient ID: Jesus Sanders, male    DOB: [birth year], 75 y.o.   MRN: [id number]  HPI Mr. Tinkey is a pleasant 75 year old gentleman with a history of polycythemia vera and chronic bronchitic changes on chest x-ray to the clinic to follow up appointment after recent hospital discharge due to right lower lobe community acquired pneumonia. He feels much better and completed the course of antibiotics with the last dose of Avelox this morning. His cough has decreased significantly and he brings minimal sputum every now and then compared to copious sputum production during the hospital admission. He was sent home on home oxygen due to his O2 sats dropping down below 88% on room air at rest. He gets a little short of breath if he exerts more. But is not short of breath at rest. Denies any fever, chills, chest pain, headache, abdominal pain, urinary abnormalities.   Review of Systems    as per history of present illness Objective:   Physical Exam    Constitutional: Vital signs reviewed.  Patient is a well-developed and well-nourished  in no acute distress and cooperative with exam.  Head: Normocephalic and atraumatic Ear: TM normal bilaterally Mouth: no erythema or exudates, MMM Eyes: PERRL, EOMI, conjunctivae normal, No scleral icterus.  Neck: Supple, Trachea midline normal ROM, No JVD, mass, thyromegaly, or carotid bruit present.  Cardiovascular: RRR, S1 normal, S2 normal, no MRG, pulses symmetric and intact bilaterally Pulmonary/Chest: CTAB, no wheezes, rales, or rhonchi, decreased breath sounds bilateral bases. Abdominal: Soft. Non-tender, non-distended, bowel sounds are normal, no masses, organomegaly, or guarding present.  GU: no CVA tenderness Musculoskeletal: No joint deformities, erythema, or stiffness, ROM full and no nontender Hematology: no cervical, inginal, or axillary adenopathy.  Neurological: A&O x3, Strenght is normal and symmetric bilaterally, cranial nerve II-XII  are grossly intact, no focal motor deficit, sensory intact to light touch bilaterally.  Skin: Warm, dry and intact. No rash, cyanosis, or clubbing.  prominent capillaries on the face hands and legs.  Psychiatric: Normal mood and affect. speech and behavior is normal. Judgment and thought content normal. Cognition and memory are normal.       Assessment & Plan:

## 2011-03-14 ENCOUNTER — Encounter (HOSPITAL_BASED_OUTPATIENT_CLINIC_OR_DEPARTMENT_OTHER): Payer: Medicare Other | Admitting: Oncology

## 2011-03-14 ENCOUNTER — Other Ambulatory Visit: Payer: Self-pay | Admitting: Physician Assistant

## 2011-03-14 DIAGNOSIS — D45 Polycythemia vera: Secondary | ICD-10-CM

## 2011-03-14 DIAGNOSIS — Z86718 Personal history of other venous thrombosis and embolism: Secondary | ICD-10-CM

## 2011-03-14 DIAGNOSIS — Z7901 Long term (current) use of anticoagulants: Secondary | ICD-10-CM

## 2011-03-14 LAB — CBC WITH DIFFERENTIAL/PLATELET
Basophils Absolute: 0.1 10*3/uL (ref 0.0–0.1)
Eosinophils Absolute: 0.3 10*3/uL (ref 0.0–0.5)
HCT: 52.7 % — ABNORMAL HIGH (ref 38.4–49.9)
HGB: 17.8 g/dL — ABNORMAL HIGH (ref 13.0–17.1)
LYMPH%: 5.9 % — ABNORMAL LOW (ref 14.0–49.0)
MCHC: 33.8 g/dL (ref 32.0–36.0)
MONO#: 1.7 10*3/uL — ABNORMAL HIGH (ref 0.1–0.9)
NEUT%: 82.7 % — ABNORMAL HIGH (ref 39.0–75.0)
Platelets: 299 10*3/uL (ref 140–400)
WBC: 17.9 10*3/uL — ABNORMAL HIGH (ref 4.0–10.3)
lymph#: 1.1 10*3/uL (ref 0.9–3.3)

## 2011-03-15 ENCOUNTER — Telehealth: Payer: Self-pay | Admitting: *Deleted

## 2011-03-15 NOTE — Telephone Encounter (Signed)
Ms cox calls to ask if "lung dr" appt has been made yet, states at last appt dr patel told pt he was going to send him to a lung dr.  Bjorn Loser cox [phone]

## 2011-03-18 ENCOUNTER — Ambulatory Visit (HOSPITAL_COMMUNITY)
Admission: RE | Admit: 2011-03-18 | Discharge: 2011-03-18 | Disposition: A | Discharge status: Home / Self Care | Payer: Medicare Other | Source: Ambulatory Visit | Attending: Internal Medicine | Admitting: Internal Medicine

## 2011-03-18 DIAGNOSIS — J40 Bronchitis, not specified as acute or chronic: Secondary | ICD-10-CM | POA: Insufficient documentation

## 2011-03-21 ENCOUNTER — Other Ambulatory Visit: Payer: Self-pay | Admitting: Oncology

## 2011-03-21 ENCOUNTER — Encounter (HOSPITAL_BASED_OUTPATIENT_CLINIC_OR_DEPARTMENT_OTHER): Payer: Medicare Other | Admitting: Oncology

## 2011-03-21 DIAGNOSIS — D45 Polycythemia vera: Secondary | ICD-10-CM

## 2011-03-21 LAB — CBC WITH DIFFERENTIAL/PLATELET
Basophils Absolute: 0.1 10*3/uL (ref 0.0–0.1)
EOS%: 1.6 % (ref 0.0–7.0)
Eosinophils Absolute: 0.2 10*3/uL (ref 0.0–0.5)
HCT: 46.4 % (ref 38.4–49.9)
HGB: 15.1 g/dL (ref 13.0–17.1)
MCH: 28.5 pg (ref 27.2–33.4)
MCV: 87.7 fL (ref 79.3–98.0)
MONO%: 7.7 % (ref 0.0–14.0)
NEUT#: 9 10*3/uL — ABNORMAL HIGH (ref 1.5–6.5)
NEUT%: 77.8 % — ABNORMAL HIGH (ref 39.0–75.0)
Platelets: 254 10*3/uL (ref 140–400)

## 2011-03-22 ENCOUNTER — Other Ambulatory Visit: Payer: Self-pay | Admitting: Internal Medicine

## 2011-03-22 DIAGNOSIS — J439 Emphysema, unspecified: Secondary | ICD-10-CM

## 2011-03-22 DIAGNOSIS — J984 Other disorders of lung: Secondary | ICD-10-CM

## 2011-03-22 MED ORDER — IPRATROPIUM BROMIDE HFA 17 MCG/ACT IN AERS: 2.0000 | INHALATION_SPRAY | Freq: Four times a day (QID) | RESPIRATORY_TRACT | Status: DC

## 2011-03-22 NOTE — Assessment & Plan Note (Signed)
Reviewed the pulmonary function tests with Dr. Josem Kaufmann. He has moderate obstructive lung disease with decreased diffusion capacity and is likely emphysema. Called patient's daughter, Ms. Rhonda Cox, and discussed with her about the need for Atrovent inhaler 4 times daily 2 puffs, and that I already sent a prescription to [facility].  also explained  getting the chest x-ray done before the next visit and we'll review it together and see if he needs any further workup for his hypoxia and COPD. She was agreeable to the plan to avoid pulmonology appointment for now.

## 2011-03-22 NOTE — Progress Notes (Deleted)
  Subjective:    Patient ID: Jesus Sanders, male    DOB: Nov 01, 1936, 75 y.o.   MRN: [id number]  HPI    Review of Systems     Objective:   Physical Exam        Assessment & Plan:

## 2011-03-22 NOTE — Telephone Encounter (Signed)
Called Ms. Jesus Sanders and discussed about the previous plan of having pulmonary function tests first and then deciding about the pulmonology appointment. But Jesus Sanders seems to be the same in terms of oxygen needs and baseline shortness of breath after talking on the phone with Ms. Sanders and so will check to pulmonary function tests and if needed will do the outpatient pulmonology appointment for Jesus Sanders. They are agreeable with the plan.

## 2011-03-25 NOTE — Op Note (Signed)
NAME:  Jesus Sanders, ROULSTON                         ACCOUNT NO.:  [id number]   MEDICAL RECORD NO.:  [id number]                   PATIENT TYPE:  AMB   LOCATION:  ENDO                                 FACILITY:  [facility]   PHYSICIAN:  John C. Madilyn Fireman, M.D.                 DATE OF BIRTH:  November 06, 1936   DATE OF PROCEDURE:  10/13/2003  DATE OF DISCHARGE:                                 OPERATIVE REPORT   PROCEDURE:  Colonoscopy with polypectomy.   INDICATION FOR PROCEDURE:  Colon cancer screening in a 75 year old patient  with no prior screening.   DESCRIPTION OF PROCEDURE:  The patient was placed in the left lateral  decubitus position and placed on the pulse monitor with continuous low-flow  oxygen delivered by nasal cannula.  He was sedated with 50 mcg IV fentanyl  and 4 mg IV Versed.  The Olympus video colonoscope was inserted into the  rectum and advanced to the cecum, confirmed by transillumination at  McBurney's point and visualization of the ileocecal valve and appendiceal  orifice.  The prep was suboptimal and there was a lot of spasm throughout  the procedure, and I could not rule out small lesions less than 1-1.5 cm in  all areas.  The cecum otherwise appeared normal.  In the ascending colon  there was a 1.2 cm sessile polyp that was removed with hot snare.  It was  very difficult to line up on this polyp.  I was not completely confident  that it was 100% removed, but it appeared to be.  This was sent for  histology.  The remainder of the ascending, transverse, descending, and  sigmoid colon appeared normal with no further polyps, masses, diverticula,  or other mucosal abnormalities.  Within the rectum there was seen an 8 mm  sessile polyp that was removed by a snare and a second 8 mm polyp at about  10 cm also removed by snare.  There were two or three diminutive polyps in  the rectum that I did not remove.  The scope was then withdrawn.  The  patient returned to the recovery room in  stable condition.  He tolerated the  procedure well, and there were no immediate complications.   IMPRESSION:  Ascending and rectal colon polyps.   PLAN:  Await histology, and will probably repeat colonoscopy in possibly two  years due to suboptimal prep.                                               John C. Madilyn Fireman, M.D.    [facility]/MEDQ  D:  10/13/2003  T:  10/13/2003  Job:  [id number]   cc:   Larina Earthly, M.D.  [address]  [place]  [place] [address]  Fax: [phone]

## 2011-03-25 NOTE — Consult Note (Signed)
[facility]  Patient:    Jesus Sanders, Jesus Sanders                      MRN: [id number] Proc. Date: 07/08/00 Adm. Date:  [id number] Attending:  Molpus, John L CC:         Jesus Sanders, M.D.   Consultation Report  CHIEF COMPLAINT: Subclavian vein thrombus.  HISTORY OF PRESENT ILLNESS: Jesus Sanders is a 75 year old white male with a known history of polycythemia with chronic phlebotomy.  He apparently has been phlebotomized for many years and followed by Jesus Sanders.  He relates having had a stroke five years ago and was on Coumadin for at least that many years, recently started on Plavix.  He has had no other problems with thrombus.  He presented with swelling of the left arm and was treated with steroids and antibiotics for what appeared to be thrombophlebitis.  Over the past week he has had now swelling of his right arm.  He was seen in the clinic in [place], [place] and was treated with antibiotics as well and on July 08, 2000 was sent to the hospital for a Doppler study, which showed subclavian thrombus.  He was seen by Jesus Sanders of vascular surgery, who then called me.  PAST MEDICAL HISTORY:  1. History of sinusitis.  2. History of previous stroke, on Plavix and Lipitor.  There are no other chronic health problems.  SOCIAL HISTORY: He has been a smoker for many years.  He is married and [place]  He is currently retired from the [employer].  REVIEW OF SYSTEMS: He denies any shortness of breath, cough, nausea, vomiting, headache, blurred vision.  PHYSICAL EXAMINATION:  GENERAL: Pleasant gentleman in no obvious distress.  VITAL SIGNS: Temperature 98.6 degrees, blood pressure 149/86, heart rate 94, respiratory rate 18.  NECK: No cervical adenopathy.  HEENT: Oropharynx normal.  Trachea midline.  CHEST: Lungs clear to auscultation and percussion.  Evidence of collaterals, right anterior chest  wall.  CARDIAC: Unremarkable.  EXTREMITIES: Left arm normal, right arm swollen from axilla down.  No tenderness.  Some collaterals vessels seen.  Some ecchymotic areas on the forearm.  Pulses intact.  Range of motion within normal limits.  ABDOMEN: No organomegaly or masses.  No inguinal adenopathy.  LABORATORY DATA: CBC showed a WBC of 5.7, hemoglobin 11.5, platelet count 246,000.  Creatinine 1.  Potassium 4.1.  PT and PTT both normal.  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  Jesus Sanders does have a number of underlying risk factors for thrombus including history of polycythemia rubra vera.  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  As he is quite stable we will give him Lovenox in the outpatient setting 1.5 mg/kg subQ, given prescription for 160 mg to be taken at home.  I have asked him to follow up with Jesus Sanders early next week to see how he is doing and start him on Coumadin or prophylax Lovenox.  I have asked him to call within the next two days should he not see any improvement in the swelling of his right arm.  The plan was discussed with the patient and family and they are in agreement, and understand the issues. DD:  07/08/00 TD:  07/09/00 Job: [id number]

## 2011-03-28 ENCOUNTER — Other Ambulatory Visit: Payer: Self-pay | Admitting: Oncology

## 2011-03-28 ENCOUNTER — Encounter (HOSPITAL_BASED_OUTPATIENT_CLINIC_OR_DEPARTMENT_OTHER): Payer: Medicare Other | Admitting: Oncology

## 2011-03-28 DIAGNOSIS — D45 Polycythemia vera: Secondary | ICD-10-CM

## 2011-03-28 DIAGNOSIS — Z7901 Long term (current) use of anticoagulants: Secondary | ICD-10-CM

## 2011-03-28 DIAGNOSIS — Z86718 Personal history of other venous thrombosis and embolism: Secondary | ICD-10-CM

## 2011-03-28 LAB — CBC WITH DIFFERENTIAL/PLATELET
BASO%: 0.4 % (ref 0.0–2.0)
Eosinophils Absolute: 0.2 10*3/uL (ref 0.0–0.5)
HCT: 44.1 % (ref 38.4–49.9)
LYMPH%: 16.1 % (ref 14.0–49.0)
MCHC: 33.1 g/dL (ref 32.0–36.0)
MCV: 87.3 fL (ref 79.3–98.0)
MONO#: 0.6 10*3/uL (ref 0.1–0.9)
MONO%: 6.6 % (ref 0.0–14.0)
NEUT%: 74.8 % (ref 39.0–75.0)
Platelets: 233 10*3/uL (ref 140–400)
RBC: 5.05 10*6/uL (ref 4.20–5.82)
WBC: 8.5 10*3/uL (ref 4.0–10.3)
nRBC: 0 % (ref 0–0)

## 2011-03-30 ENCOUNTER — Telehealth: Payer: Self-pay | Admitting: *Deleted

## 2011-03-30 NOTE — Telephone Encounter (Signed)
Call from [place] stating pt has O2 to use as needed.  He has not used for 2 weeks and he wants it removed from his house. [facility] needs a doctor order to remove it from the house.  Will you give the order.

## 2011-03-31 NOTE — Telephone Encounter (Signed)
Please call them stating that I will do that during the next visit when he comes in after checking him.

## 2011-04-01 ENCOUNTER — Telehealth: Payer: Self-pay | Admitting: *Deleted

## 2011-04-01 NOTE — Telephone Encounter (Signed)
Pt informed

## 2011-04-01 NOTE — Telephone Encounter (Signed)
I talked with Dr Allena Katz and he suggested OTC cough med with expectorant.  Daughter called and informed, asked to watch closely and call clinic if any changes, or fever. She voices understanding.

## 2011-04-01 NOTE — Telephone Encounter (Signed)
Daughter called with concerns about Father.  Has appointment June 6th He is getting congested, spitting up mucous.  Returned call for clarification and no answer.  Will try again and get more info.

## 2011-04-01 NOTE — Telephone Encounter (Signed)
Daughter  returned call and states pt has productive cough, onset today. Denies fever. Denies SOB She wants to know if there is something she can give to break up mucous. No other c/o The constant cough will cause Respiratory problems. Pt has Hx of this type cough and it progressed to pneumonia  Pt # [phone]

## 2011-04-04 ENCOUNTER — Emergency Department (HOSPITAL_COMMUNITY): Payer: Medicare Other

## 2011-04-04 ENCOUNTER — Encounter: Payer: Self-pay | Admitting: Internal Medicine

## 2011-04-04 ENCOUNTER — Inpatient Hospital Stay (HOSPITAL_COMMUNITY)
Admission: EM | Admit: 2011-04-04 | Discharge: 2011-04-19 | DRG: 326 | Disposition: A | Discharge status: Home / Self Care | Payer: Medicare Other | Attending: Surgery | Admitting: Surgery

## 2011-04-04 DIAGNOSIS — K653 Choleperitonitis: Secondary | ICD-10-CM | POA: Diagnosis not present

## 2011-04-04 DIAGNOSIS — Z87891 Personal history of nicotine dependence: Secondary | ICD-10-CM

## 2011-04-04 DIAGNOSIS — D45 Polycythemia vera: Secondary | ICD-10-CM | POA: Diagnosis present

## 2011-04-04 DIAGNOSIS — K8010 Calculus of gallbladder with chronic cholecystitis without obstruction: Secondary | ICD-10-CM | POA: Diagnosis present

## 2011-04-04 DIAGNOSIS — K859 Acute pancreatitis without necrosis or infection, unspecified: Secondary | ICD-10-CM | POA: Diagnosis present

## 2011-04-04 DIAGNOSIS — K265 Chronic or unspecified duodenal ulcer with perforation: Principal | ICD-10-CM | POA: Diagnosis present

## 2011-04-04 DIAGNOSIS — Z86718 Personal history of other venous thrombosis and embolism: Secondary | ICD-10-CM

## 2011-04-04 DIAGNOSIS — Z5331 Laparoscopic surgical procedure converted to open procedure: Secondary | ICD-10-CM

## 2011-04-04 DIAGNOSIS — J449 Chronic obstructive pulmonary disease, unspecified: Secondary | ICD-10-CM | POA: Diagnosis present

## 2011-04-04 DIAGNOSIS — J4489 Other specified chronic obstructive pulmonary disease: Secondary | ICD-10-CM | POA: Diagnosis present

## 2011-04-04 DIAGNOSIS — J189 Pneumonia, unspecified organism: Secondary | ICD-10-CM | POA: Diagnosis present

## 2011-04-04 DIAGNOSIS — E46 Unspecified protein-calorie malnutrition: Secondary | ICD-10-CM | POA: Diagnosis present

## 2011-04-04 DIAGNOSIS — I200 Unstable angina: Secondary | ICD-10-CM | POA: Diagnosis not present

## 2011-04-04 DIAGNOSIS — J9600 Acute respiratory failure, unspecified whether with hypoxia or hypercapnia: Secondary | ICD-10-CM | POA: Diagnosis not present

## 2011-04-04 DIAGNOSIS — Z8673 Personal history of transient ischemic attack (TIA), and cerebral infarction without residual deficits: Secondary | ICD-10-CM

## 2011-04-04 DIAGNOSIS — D3500 Benign neoplasm of unspecified adrenal gland: Secondary | ICD-10-CM | POA: Diagnosis present

## 2011-04-04 LAB — TROPONIN I
Troponin I: <0.3 ng/mL (ref <0.30)
Troponin I: <0.3 ng/mL (ref <0.30)

## 2011-04-04 LAB — COMPREHENSIVE METABOLIC PANEL
ALT: 8 U/L (ref 0–53)
AST: 12 U/L (ref 0–37)
Albumin: 2.7 g/dL — ABNORMAL LOW (ref 3.5–5.2)
Calcium: 9.6 mg/dL (ref 8.4–10.5)
Creatinine, Ser: 1.01 mg/dL (ref 0.4–1.5)
GFR calc Af Amer: >60 mL/min (ref >60)
GFR calc non Af Amer: >60 mL/min (ref >60)
Sodium: 136 mEq/L (ref 135–145)
Total Protein: 6.3 g/dL (ref 6.0–8.3)

## 2011-04-04 LAB — URINALYSIS, ROUTINE W REFLEX MICROSCOPIC
Bilirubin Urine: NEGATIVE
Glucose, UA: NEGATIVE mg/dL
Ketones, ur: NEGATIVE mg/dL
Nitrite: NEGATIVE
Specific Gravity, Urine: 1.022 (ref 1.005–1.030)
pH: 5 (ref 5.0–8.0)

## 2011-04-04 LAB — DIFFERENTIAL
Eosinophils Absolute: 0.2 10*3/uL (ref 0.0–0.7)
Eosinophils Relative: 2 % (ref 0–5)
Lymphocytes Relative: 13 % (ref 12–46)
Lymphs Abs: 1.2 10*3/uL (ref 0.7–4.0)
Monocytes Absolute: 0.7 10*3/uL (ref 0.1–1.0)

## 2011-04-04 LAB — CBC
Hemoglobin: 13.4 g/dL (ref 13.0–17.0)
MCH: 28.8 pg (ref 26.0–34.0)
MCHC: 32.8 g/dL (ref 30.0–36.0)
MCV: 88 fL (ref 78.0–100.0)
RBC: 4.65 MIL/uL (ref 4.22–5.81)

## 2011-04-04 LAB — CK TOTAL AND CKMB (NOT AT ARMC)
Relative Index: INVALID (ref 0.0–2.5)
Relative Index: INVALID (ref 0.0–2.5)
Total CK: 31 U/L (ref 7–232)

## 2011-04-04 LAB — URINE MICROSCOPIC-ADD ON

## 2011-04-04 LAB — PROTIME-INR: Prothrombin Time: 13.6 seconds (ref 11.6–15.2)

## 2011-04-04 NOTE — Progress Notes (Unsigned)
Hospital Admission Note Date: 04/04/2011  Patient name: Jesus Sanders Medical record number: [id number] Date of birth: 1936-01-23 Age: 75 y.o. Gender: male PCP: Lyn Hollingshead, MD  Medical Service: Internal Medicine  Attending physician: Dr. Blanch Media    Pager: Resident [phone]): Dr. Baltazar Apo    Pager: [phone] Resident (R1): Dr. Dorthula Rue     Pager: [phone]  Chief Complaint: abdominal pain  History of Present Illness:74 y/o man with PMH significant for tobacco abuse , polycythemia presented to the ER with chief complaint of abdominal pain . He reports that he was having intermittent abdominal pain for 2-3 weeks that  used to last for 3-5 minutes but this morning he had an episode of abdominal pain that  lasted for 3 hours. He describes his pain as sharp, located in the epigastrium and RUQ , rated his pain 7-8/10 with no radiation, no aggravating or relieving factors. The pain was associated with cold sweats but not with SOB, nausea or vomiting. He also denies any fever, cough, palpitations, bladder or bowel complaints. Of note that he was recently discharged from the hospital in 04/12 when he was treated with avelox for CAP for 1 week.  Current Outpatient Prescriptions  Medication Sig Dispense Refill  . albuterol (VENTOLIN HFA) 108 (90 BASE) MCG/ACT inhaler Inhale 2 puffs into the lungs every 4 (four) hours as needed.        Marland Kitchen aspirin 81 MG tablet Take 81 mg by mouth daily.        Marland Kitchen ipratropium (ATROVENT HFA) 17 MCG/ACT inhaler Inhale 2 puffs into the lungs 4 (four) times daily.  1 Inhaler  2    Allergies: NKDA   Past Medical History  Diagnosis Date  . Polycythemia vera     [facility]  will restart his phlebotomies from about Mar 15 2011    PAST SURGICAL HISTORY: None  FAMILY HISTORY: Non-contributory   History   Social History  . Marital Status: Married    Spouse Name: N/A    Number of Children: N/A  .  Years of Education: N/A   Occupational History  . Not on file.   Social History Main Topics  . Smoking status: Former Smoker    Types: Cigarettes    Quit date: 03/04/2011  . Smokeless tobacco: Not on file   Comment: Quit smoking in April 2012. About 60-pack-year history of smoking  . Alcohol Use: Not on file  . Drug Use: Not on file  . Sexually Active: Not on file   Other Topics Concern  . Not on file   Social History Narrative   Lives with his daughter at home. Pretty functional and does ADLs by himself.Wife died about 4 years ago.Has Medicare.    Review of Systems: Pertinent items are noted in HPI.  Physical Exam: VITALS: T-98.1, HR-97>75 , BP-140/83, R-14, O2 sats 96% on RA  General appearance: alert, cooperative and no distress Lungs:crackles at right lung base, no wheezes or rhonchi. Heart: regular rate and rhythm, S1, S2 normal, no murmur, click, rub or gallop Abdomen: soft,tender to palpation in RUQ and epigastrium, murphy's sign posoitive, positive bowel sounds, no organomegaly. Extremities: extremities normal, atraumatic, no cyanosis or edema Pulses: 2+ and symmetric Lymph nodes: Cervical, supraclavicular, and axillary nodes normal. Neurologic: Alert and oriented X 3, normal strength and tone. Normal symmetric reflexes. Normal coordination and gait  Lab results:  WBC  9.2               4.0-10.5         K/uL  RBC                                      4.65              4.22-5.81        MIL/uL  Hemoglobin (HGB)                         13.4              13.0-17.0        g/dL  Hematocrit (HCT)                         40.9              39.0-52.0        %  MCV                                      88.0              78.0-100.0       fL  MCH -                                    28.8              26.0-34.0        pg  MCHC                                     32.8              30.0-36.0        g/dL  RDW                                       15.2              11.5-15.5        %  Platelet Count (PLT)                     195               150-400          K/uL  Neutrophils, %                           77                43-77            %  Lymphocytes, %                           13                12-46            %  Monocytes, %                             7                 3-12             %  Eosinophils, %                           2                 0-5              %  Basophils, %                             0                 0-1              %  Neutrophils, Absolute                    7.1               1.7-7.7          K/uL  Lymphocytes, Absolute                    1.2               0.7-4.0          K/uL  Monocytes, Absolute                      0.7               0.1-1.0          K/uL  Eosinophils, Absolute                    0.2               0.0-0.7          K/uL  Basophils, Absolute                      0.0               0.0-0.1          K/uL   Sodium (NA)                              136               135-145          mEq/L  Potassium (K)                            4.2               3.5-5.1          mEq/L  Chloride                                 102               96-112  mEq/L  CO2                                      28                19-32            mEq/L  Glucose                                  90                70-99            mg/dL  BUN                                      18                6-23             mg/dL  Creatinine                               1.01              0.4-1.5          mg/dL  GFR, Est Non African American            >60               >60              mL/min  GFR, Est African American                >60               >60              mL/min    Oversized comment, see footnote  1  Bilirubin, Total                         0.3               0.3-1.2          mg/dL  Alkaline Phosphatase                     69                39-117           U/L  SGOT (AST)                               12                 0-37             U/L  SGPT (ALT)                               8                 0-53             U/L  Total  Protein                           6.3               6.0-8.3          g/dL  Albumin-Blood                            2.7        l      3.5-5.2          g/dL  Calcium                                  9.6               8.4-10.5         Mg/dL   Color, Urine                             YELLOW            YELLOW  Appearance                               CLEAR             CLEAR  Specific Gravity                         1.022             1.005-1.030  pH                                       5.0               5.0-8.0  Urine Glucose                            NEGATIVE          NEG              mg/dL  Bilirubin                                NEGATIVE          NEG  Ketones                                  NEGATIVE          NEG              mg/dL  Blood                                    TRACE      a      NEG  Protein                                  NEGATIVE  NEG              mg/dL  Urobilinogen                             0.2               0.0-1.0          mg/dL  Nitrite                                  NEGATIVE          NEG  Leukocytes                               TRACE      a      NEG   Squamous Epithelial / LPF                FEW        a      RARE  Casts / HPF                              SEE NOTE.  a      NEG    HYALINE CASTS  WBC / HPF                                3-6               <3               WBC/hpf  RBC / HPF                                0-2               <3               RBC/hpf  Bacteria / HPF                           RARE              RARE  Urine-Other                              SEE NOTE.    MUCOUS PRESENT  Creatine Kinase, Total                   31                7-232            U/L  CK, MB                                   2.3               0.3-4.0          Ng/mL   Troponin I                               <  0.30             <0.30             ng/mL    Imaging results: CXR: Persistent ill-defined density at the right lung base, perhaps slightly less extensive but more dense. This could represent persistent pneumonia or a mass.  CT chest: Right lower lobe airspace density is most likely related to  inflammatory process such as pneumonia and scarring.  There is extensive adjacent pleural calcification.  Underlying neoplasm is   possible and continued follow-up is suggested.    Gallbladder wall thickening and pericholecystic edema suggestingcacute cholecystitis.    Left adrenal nodule, most likely adenoma.    Assessment & Plan by Problem:  1.Abdominal pain: Given the location of his pain i e epigastrium and RUQ, DD include acute cholecystitis vs pancreatitis vs PNA. His CT scan shows some gall bladder thickening and pericholecystic fluid suggestive of acute cholecystitis. Will admit to regular floor. Will check CBC, CMP in AM and get stat lipase levels. Will treat his pain and nausea with morphine and zofran. Will consult surgery.  2. COPD: stable. Continue on home inhalers.  3. RLL opacity : Given his smoking history and the persistent nature of this opacity with his previous imaging in 04/12 there was a concern for lung mass and CT- scan was ordered  CT scan ruled out the suspicion. Will F/u with repeat imaging in 4 weeks.( Sometimes PNA can take 6-8 weeks to resolve)  4. DVT proph: Lovenox

## 2011-04-05 ENCOUNTER — Inpatient Hospital Stay (HOSPITAL_COMMUNITY): Payer: Medicare Other

## 2011-04-05 DIAGNOSIS — R109 Unspecified abdominal pain: Secondary | ICD-10-CM

## 2011-04-05 DIAGNOSIS — J189 Pneumonia, unspecified organism: Secondary | ICD-10-CM

## 2011-04-05 LAB — URINE CULTURE
Culture  Setup Time: 201205281551
Culture: NO GROWTH

## 2011-04-05 LAB — CBC
HCT: 38.8 % — ABNORMAL LOW (ref 39.0–52.0)
Platelets: 185 10*3/uL (ref 150–400)
RBC: 4.39 MIL/uL (ref 4.22–5.81)
RDW: 15.3 % (ref 11.5–15.5)
WBC: 6.9 10*3/uL (ref 4.0–10.5)

## 2011-04-05 LAB — HEPATIC FUNCTION PANEL
ALT: 8 U/L (ref 0–53)
AST: 11 U/L (ref 0–37)
Albumin: 2.7 g/dL — ABNORMAL LOW (ref 3.5–5.2)
Total Bilirubin: 0.3 mg/dL (ref 0.3–1.2)

## 2011-04-05 LAB — BASIC METABOLIC PANEL
BUN: 16 mg/dL (ref 6–23)
CO2: 28 mEq/L (ref 19–32)
Glucose, Bld: 83 mg/dL (ref 70–99)
Potassium: 5.1 mEq/L (ref 3.5–5.1)
Sodium: 140 mEq/L (ref 135–145)

## 2011-04-05 LAB — STREP PNEUMONIAE URINARY ANTIGEN: Strep Pneumo Urinary Antigen: NEGATIVE

## 2011-04-05 LAB — LEGIONELLA ANTIGEN, URINE: Legionella Antigen, Urine: NEGATIVE

## 2011-04-06 ENCOUNTER — Other Ambulatory Visit (INDEPENDENT_AMBULATORY_CARE_PROVIDER_SITE_OTHER): Payer: Self-pay | Admitting: General Surgery

## 2011-04-06 DIAGNOSIS — R109 Unspecified abdominal pain: Secondary | ICD-10-CM

## 2011-04-06 LAB — COMPREHENSIVE METABOLIC PANEL
ALT: 124 U/L — ABNORMAL HIGH (ref 0–53)
AST: 138 U/L — ABNORMAL HIGH (ref 0–37)
CO2: 27 mEq/L (ref 19–32)
Calcium: 8.9 mg/dL (ref 8.4–10.5)
Chloride: 106 mEq/L (ref 96–112)
GFR calc Af Amer: >60 mL/min (ref >60)
GFR calc non Af Amer: >60 mL/min (ref >60)
Potassium: 4 mEq/L (ref 3.5–5.1)
Sodium: 139 mEq/L (ref 135–145)

## 2011-04-06 LAB — LIPASE, BLOOD: Lipase: 22 U/L (ref 11–59)

## 2011-04-06 LAB — CBC
Hemoglobin: 12.8 g/dL — ABNORMAL LOW (ref 13.0–17.0)
MCHC: 32.2 g/dL (ref 30.0–36.0)
RBC: 4.5 MIL/uL (ref 4.22–5.81)
WBC: 7.4 10*3/uL (ref 4.0–10.5)

## 2011-04-06 LAB — SURGICAL PCR SCREEN: MRSA, PCR: NEGATIVE

## 2011-04-07 LAB — DIFFERENTIAL
Basophils Absolute: 0 10*3/uL (ref 0.0–0.1)
Lymphocytes Relative: 7 % — ABNORMAL LOW (ref 12–46)
Lymphs Abs: 0.8 10*3/uL (ref 0.7–4.0)
Monocytes Relative: 7 % (ref 3–12)
Neutro Abs: 9.8 10*3/uL — ABNORMAL HIGH (ref 1.7–7.7)
Neutrophils Relative %: 86 % — ABNORMAL HIGH (ref 43–77)

## 2011-04-07 LAB — COMPREHENSIVE METABOLIC PANEL
Albumin: 2.3 g/dL — ABNORMAL LOW (ref 3.5–5.2)
Alkaline Phosphatase: 101 U/L (ref 39–117)
BUN: 12 mg/dL (ref 6–23)
CO2: 25 mEq/L (ref 19–32)
Chloride: 104 mEq/L (ref 96–112)
Creatinine, Ser: 0.8 mg/dL (ref 0.4–1.5)
GFR calc non Af Amer: >60 mL/min (ref >60)
Potassium: 3.8 mEq/L (ref 3.5–5.1)
Sodium: 138 mEq/L (ref 135–145)
Total Bilirubin: 3 mg/dL — ABNORMAL HIGH (ref 0.3–1.2)

## 2011-04-07 LAB — CBC
HCT: 39.4 % (ref 39.0–52.0)
Hemoglobin: 12.9 g/dL — ABNORMAL LOW (ref 13.0–17.0)
MCH: 28.7 pg (ref 26.0–34.0)
MCV: 87.6 fL (ref 78.0–100.0)
Platelets: 190 10*3/uL (ref 150–400)
RBC: 4.5 MIL/uL (ref 4.22–5.81)
WBC: 11.3 10*3/uL — ABNORMAL HIGH (ref 4.0–10.5)

## 2011-04-08 ENCOUNTER — Inpatient Hospital Stay (HOSPITAL_COMMUNITY): Payer: Medicare Other

## 2011-04-08 DIAGNOSIS — K631 Perforation of intestine (nontraumatic): Secondary | ICD-10-CM

## 2011-04-08 DIAGNOSIS — K659 Peritonitis, unspecified: Secondary | ICD-10-CM

## 2011-04-08 HISTORY — DX: Peritonitis, unspecified: K65.9

## 2011-04-08 HISTORY — DX: Perforation of intestine (nontraumatic): K63.1

## 2011-04-08 LAB — CBC
Hemoglobin: 14.2 g/dL (ref 13.0–17.0)
MCH: 29.4 pg (ref 26.0–34.0)
MCHC: 32.2 g/dL (ref 30.0–36.0)
Platelets: 204 10*3/uL (ref 150–400)
RBC: 4.66 MIL/uL (ref 4.22–5.81)
RBC: 4.9 MIL/uL (ref 4.22–5.81)

## 2011-04-08 LAB — CARDIAC PANEL(CRET KIN+CKTOT+MB+TROPI)
CK, MB: 2.7 ng/mL (ref 0.3–4.0)
CK, MB: 2.4 ng/mL (ref 0.3–4.0)
Relative Index: 1.2 (ref 0.0–2.5)
Relative Index: 1.8 (ref 0.0–2.5)
Total CK: 133 U/L (ref 7–232)
Troponin I: <0.3 ng/mL (ref <0.30)

## 2011-04-08 LAB — GLUCOSE, CAPILLARY: Glucose-Capillary: 112 mg/dL — ABNORMAL HIGH (ref 70–99)

## 2011-04-08 LAB — BASIC METABOLIC PANEL
CO2: 29 mEq/L (ref 19–32)
Chloride: 108 mEq/L (ref 96–112)
GFR calc Af Amer: >60 mL/min (ref >60)
Potassium: 4.2 mEq/L (ref 3.5–5.1)
Sodium: 140 mEq/L (ref 135–145)

## 2011-04-08 LAB — POCT I-STAT 3, ART BLOOD GAS (G3+)
O2 Saturation: 95 %
Patient temperature: 98.6
pO2, Arterial: 78 mmHg — ABNORMAL LOW (ref 80.0–100.0)

## 2011-04-08 LAB — COMPREHENSIVE METABOLIC PANEL
ALT: 102 U/L — ABNORMAL HIGH (ref 0–53)
AST: 73 U/L — ABNORMAL HIGH (ref 0–37)
Albumin: 2.4 g/dL — ABNORMAL LOW (ref 3.5–5.2)
Calcium: 9 mg/dL (ref 8.4–10.5)
Chloride: 104 mEq/L (ref 96–112)
Creatinine, Ser: 0.99 mg/dL (ref 0.4–1.5)
GFR calc Af Amer: >60 mL/min (ref >60)
Sodium: 140 mEq/L (ref 135–145)

## 2011-04-09 ENCOUNTER — Inpatient Hospital Stay (HOSPITAL_COMMUNITY): Payer: Medicare Other

## 2011-04-09 DIAGNOSIS — J9600 Acute respiratory failure, unspecified whether with hypoxia or hypercapnia: Secondary | ICD-10-CM

## 2011-04-09 DIAGNOSIS — K659 Peritonitis, unspecified: Secondary | ICD-10-CM

## 2011-04-09 LAB — CARDIAC PANEL(CRET KIN+CKTOT+MB+TROPI)
CK, MB: 2.2 ng/mL (ref 0.3–4.0)
Relative Index: INVALID (ref 0.0–2.5)
Troponin I: 0.51 ng/mL (ref <0.30)
Troponin I: <0.3 ng/mL (ref <0.30)
Troponin I: <0.3 ng/mL (ref <0.30)

## 2011-04-09 LAB — CBC
Hemoglobin: 12.2 g/dL — ABNORMAL LOW (ref 13.0–17.0)
MCH: 28.7 pg (ref 26.0–34.0)
MCHC: 31.6 g/dL (ref 30.0–36.0)

## 2011-04-09 LAB — COMPREHENSIVE METABOLIC PANEL
ALT: 59 U/L — ABNORMAL HIGH (ref 0–53)
AST: 30 U/L (ref 0–37)
Alkaline Phosphatase: 62 U/L (ref 39–117)
CO2: 29 mEq/L (ref 19–32)
Chloride: 110 mEq/L (ref 96–112)
Creatinine, Ser: 1.29 mg/dL (ref 0.4–1.5)
GFR calc Af Amer: >60 mL/min (ref >60)
GFR calc non Af Amer: 55 mL/min — ABNORMAL LOW (ref >60)
Sodium: 144 mEq/L (ref 135–145)
Total Bilirubin: 2.1 mg/dL — ABNORMAL HIGH (ref 0.3–1.2)

## 2011-04-09 LAB — CREATININE, URINE, RANDOM: Creatinine, Urine: 183.51 mg/dL

## 2011-04-09 LAB — SODIUM, URINE, RANDOM: Sodium, Ur: 50 mEq/L

## 2011-04-10 ENCOUNTER — Inpatient Hospital Stay (HOSPITAL_COMMUNITY): Payer: Medicare Other

## 2011-04-10 DIAGNOSIS — K659 Peritonitis, unspecified: Secondary | ICD-10-CM

## 2011-04-10 DIAGNOSIS — J189 Pneumonia, unspecified organism: Secondary | ICD-10-CM

## 2011-04-10 DIAGNOSIS — J9600 Acute respiratory failure, unspecified whether with hypoxia or hypercapnia: Secondary | ICD-10-CM

## 2011-04-10 LAB — BASIC METABOLIC PANEL
BUN: 24 mg/dL — ABNORMAL HIGH (ref 6–23)
Chloride: 115 mEq/L — ABNORMAL HIGH (ref 96–112)
Potassium: 4.2 mEq/L (ref 3.5–5.1)

## 2011-04-10 LAB — CBC
MCH: 29.2 pg (ref 26.0–34.0)
MCV: 91.2 fL (ref 78.0–100.0)
Platelets: 233 10*3/uL (ref 150–400)
RBC: 4.65 MIL/uL (ref 4.22–5.81)

## 2011-04-10 LAB — GLUCOSE, CAPILLARY: Glucose-Capillary: 125 mg/dL — ABNORMAL HIGH (ref 70–99)

## 2011-04-10 LAB — PHOSPHORUS: Phosphorus: 2.8 mg/dL (ref 2.3–4.6)

## 2011-04-10 LAB — CARDIAC PANEL(CRET KIN+CKTOT+MB+TROPI): CK, MB: 2.1 ng/mL (ref 0.3–4.0)

## 2011-04-10 LAB — MAGNESIUM: Magnesium: 2.3 mg/dL (ref 1.5–2.5)

## 2011-04-11 LAB — CBC
MCH: 29.3 pg (ref 26.0–34.0)
MCHC: 32.3 g/dL (ref 30.0–36.0)
MCV: 90.9 fL (ref 78.0–100.0)
Platelets: 224 10*3/uL (ref 150–400)
RDW: 16.3 % — ABNORMAL HIGH (ref 11.5–15.5)
WBC: 13.1 10*3/uL — ABNORMAL HIGH (ref 4.0–10.5)

## 2011-04-11 LAB — BASIC METABOLIC PANEL
BUN: 25 mg/dL — ABNORMAL HIGH (ref 6–23)
Calcium: 9.2 mg/dL (ref 8.4–10.5)
Creatinine, Ser: 0.94 mg/dL (ref 0.4–1.5)
GFR calc non Af Amer: >60 mL/min (ref >60)

## 2011-04-11 LAB — GLUCOSE, CAPILLARY: Glucose-Capillary: 114 mg/dL — ABNORMAL HIGH (ref 70–99)

## 2011-04-12 LAB — GLUCOSE, CAPILLARY
Glucose-Capillary: 119 mg/dL — ABNORMAL HIGH (ref 70–99)
Glucose-Capillary: 115 mg/dL — ABNORMAL HIGH (ref 70–99)

## 2011-04-12 LAB — COMPREHENSIVE METABOLIC PANEL
ALT: 24 U/L (ref 0–53)
CO2: 32 mEq/L (ref 19–32)
Calcium: 8.7 mg/dL (ref 8.4–10.5)
Chloride: 107 mEq/L (ref 96–112)
GFR calc non Af Amer: >60 mL/min (ref >60)
Glucose, Bld: 108 mg/dL — ABNORMAL HIGH (ref 70–99)
Sodium: 145 mEq/L (ref 135–145)
Total Bilirubin: 2.2 mg/dL — ABNORMAL HIGH (ref 0.3–1.2)

## 2011-04-12 LAB — DIFFERENTIAL
Basophils Absolute: 0 10*3/uL (ref 0.0–0.1)
Lymphocytes Relative: 6 % — ABNORMAL LOW (ref 12–46)
Monocytes Absolute: 0.7 10*3/uL (ref 0.1–1.0)
Monocytes Relative: 9 % (ref 3–12)
Neutro Abs: 6.6 10*3/uL (ref 1.7–7.7)

## 2011-04-12 LAB — CBC
HCT: 38 % — ABNORMAL LOW (ref 39.0–52.0)
Hemoglobin: 12 g/dL — ABNORMAL LOW (ref 13.0–17.0)
MCH: 28.3 pg (ref 26.0–34.0)
MCHC: 31.6 g/dL (ref 30.0–36.0)

## 2011-04-12 NOTE — Op Note (Signed)
NAME:  Jesus Sanders, Jesus Sanders               ACCOUNT NO.:  [id number]  MEDICAL RECORD NO.:  [id number]           PATIENT TYPE:  LOCATION:                                 FACILITY:  PHYSICIAN:  Ollen Gross. Vernell Morgans, M.D. DATE OF BIRTH:  05/21/1936  DATE OF PROCEDURE:  04/06/2011 DATE OF DISCHARGE:                              OPERATIVE REPORT   PREOPERATIVE DIAGNOSIS:  Cholecystitis with stones.  POSTOPERATIVE DIAGNOSIS:  Cholecystitis with stones with duodenal perforation.  PROCEDURE:  Aborted laparoscopy, open cholecystectomy, repair of duodenal perforation, duodenal exclusion, and gastrojejunostomy antecolic.  SURGEON:  Ollen Gross. Vernell Morgans, MD  ASSISTANT:  Lorne Skeens. Hoxworth, MD  ANESTHESIA:  General endotracheal.  PROCEDURE:  After informed consent was obtained, the patient was brought to the operating room, placed in supine position on the operating room table.  After adequate induction of general anesthesia, the patient's abdomen was prepped with ChloraPrep, allowed to dry, and draped in usual sterile manner.  The area above the umbilicus was infiltrated with 0.25% Marcaine.  The small incision was made a 15-blade knife.  This incision was carried down through the subcutaneous tissue bluntly with hemostat and [employer] retractors until the nail was identified.  The linea alba was incised with a 15-blade knife.  Each side was grasped with Kocher clamps and elevated anteriorly.  The preperitoneal space was then probed bluntly with the hemostat until the peritoneum was opened and access was gained to the abdominal cavity.  A 0 Vicryl pursestring stitch was placed in the fascia surrounding the opening.  A Hasson cannula was placed through the opening and anchored in place with previously placed Vicryl pursestring stitch.  The abdomen was then insufflated with carbon dioxide without difficulty.  The patient was placed in reverse Trendelenburg position, rotated with right side up.  Next,  his epigastric region was infiltrated with 0.25% Marcaine.  A small incision was made with 15-blade knife.  A 10-mm port was placed bluntly through this incision into the abdominal cavity under direct vision.  Sites were then chosen laterally on the right side of the abdomen and placement of 5-mm ports.  Each of these area was infiltrated with 0.25% Marcaine. Small stab incisions were made with a 15-blade knife and 5-mm ports were placed bluntly through these incisions into the abdominal cavity under direct vision.  The blunt grasper was placed through the lateralmost 5- mm port and used to grasp the dome of the gallbladder and elevated anteriorly and superiorly.  Another blunt grasper was placed through the other 5-mm port and used to retract on the body and neck of the gallbladder.  The gallbladder was inflamed.  The base of the gallbladder was unfortunately fused to the duodenum and I could not separate the two safely.  We therefore aborted the laparoscopy and made a right subcostal incision with a 15-blade knife.  The incision was carried down through the skin and subcutaneous tissue sharply with the electrocautery until the fascia of the abdominal wall was encountered.  The fascia muscles were also divided with the electrocautery until the abdominal cavity was entered.  The rest of the  incision was opened under direct vision.  The Bookwalter retractor was deployed.  As we were able to sort of separate the gallbladder and duodenum, it became apparent that what happened was that the patient may have had a perforation of his duodenum that was then walled off the gallbladder since there was no real hole in the gallbladder that would suggest a gallstone ileus type erosion of a stone into the duodenum.  We then removed the gallbladder from the top using the top-down technique, separating it from the liver by electrocautery. Once we got near the base of the gallbladder, it became very thick  and inflamed.  We basically chose a spot where we thought we were at the neck of the gallbladder and divided the gallbladder there.  There was no lumen.  We did place 2-0 silk figure-of-eight stitch across this.  We did not want to go any further because of the risk of injury to the common duct.  There was a small tear in the liver just to the right of the gallbladder and this was controlled with some Surgicel SNoW and was hemostatic by the end of the case.  The duodenum was very inflamed.  The hole in the duodenum was fairly large.  We tried to close it with some 2- 0 silk stitches, but the duodenum was so inflamed it was difficult to hold the stitches.  We close it as much as we could and then decided to do a duodenal exclusion.  At the level of the pylorus, we got into the lesser sac and opened up some of the greater omentum.  We dissected behind the stomach and then on the lesser curve we opened the gastrohepatic ligament there bluntly with a Kelly clamp until we could get around the distal stomach at the level the pylorus.  Once we were able to accomplish this, we placed a TA-60 green load stapler across the pyloric area and fired it thereby excluding the duodenum.  We then chose a loop of small bowel that easily reached up to the stomach and created a gastrojejunostomy.  We held the small bowel in close apposition to the stomach.  We made an enterotomy on each and then placed each limb of a GIA 75 stapler down the appropriate limb of stomach or small bowel and fired it creating a nice widely patent gastrojejunostomy.  The common enterotomy was closed with interrupted 2-0 silk stitches.  The staple line looked healthy.  The efferent limb was to the right.  The afferent limb was to the left.  A 2-0 silk crotch stitch was also placed.  Once this was accomplished, the anastomosis appeared to be healthy and in good position, widely patent.  We then irrigated then with copious amounts of  saline.  We used the previous 5-mm port sites for placement of two JP drains both placed around the duodenum and in the right gutter and the inferior edge of the liver.  The fascia of the anterior abdominal wall was closed with four #1 PDS sutures, so each layer was irrigated and it was closed.  The skin was then closed with staples. The ports had been removed.  The fascial defect at the supraumbilical incision was closed with previously placed Vicryl pursestring stitch and the skin was closed with staples.  Sterile dressings were applied.  The patient tolerated the procedure well.  At the end of the case, all needle, sponge, and instrument counts were correct.  The patient was then awakened and  taken to recovery in stable condition.     Ollen Gross. Vernell Morgans, M.D.     PST/MEDQ  D:  04/07/2011  T:  04/08/2011  Job:  [id number]  Electronically Signed by Chevis Pretty III M.D. on 04/12/2011 04:58:40 PM

## 2011-04-12 NOTE — Op Note (Signed)
NAME:  Jesus Sanders, SANOR NO.:  [id number]  MEDICAL RECORD NO.:  [id number]           PATIENT TYPE:  I  LOCATION:  2114                         FACILITY:  [facility]  PHYSICIAN:  Ollen Gross. Vernell Morgans, M.D. DATE OF BIRTH:  Feb 03, 1936  DATE OF PROCEDURE:  04/08/2011 DATE OF DISCHARGE:                              OPERATIVE REPORT   PREOPERATIVE DIAGNOSIS:  Perforated duodenum from 2 days ago with evidence of sepsis and possible peritonitis.  POSTOPERATIVE DIAGNOSIS:  Perforated duodenum from 2 days ago with evidence of sepsis and possible peritonitis.  PROCEDURE:  Exploratory laparoscopy and drainage of bile peritonitis.  SURGEON:  Ollen Gross. Vernell Morgans, MD  ASSISTANT:  Brayton El, PA-C  ANESTHESIA:  General endotracheal.  PROCEDURE:  After informed consent was obtained, the patient was brought to the operating room, placed in supine position on the operating room table.  After adequate induction of general anesthesia, the patient's abdomen was prepped with Betadine and draped in usual sterile manner. The patient just 2 days ago underwent exploration where he was found to have a perforation of his duodenum that was being patched by his gallbladder.  His gallbladder was removed.  Because of the amount of inflammatory process going on around the duodenal perforation, we were unable to close that effectively.  We therefore excluded them and did a gastrojejunostomy to divert the flow succus away from this area and draining very well.  He was having evidence of tachycardia and signs of sepsis with a tender abdomen so we decided to bring him back for a second look.  We removed the staples from his supraumbilical incision and removed the 0 Vicryl stitch.  We reaccessed his abdominal cavity through this, placed a Hasson cannula through this opening, anchored in place with a new 0 Vicryl pursestring stitch and then re-insufflated his abdomen.  He did have pretty significant  amount of bile leakage throughout the abdomen.  We decided to place two 5-mm ports on the left side of his abdomen so that we could explore.  Each of these areas was infiltrated with 0.25% Marcaine.  Small stab incisions were made with a 15 blade knife and 5-mm ports were placed bluntly through these incisions into the abdominal cavity under direct vision.  Using couple of Glassman graspers, we were able to examine his abdomen.  The area where the perforation of the duodenum was was very densely stuck with omentum and the medial drain was sitting right on top into this area. The lateral drain was up in the right gutter and up over the liver and it was catching the bile that was leaking.  He had a big collection of bile over his spleen on the left side.  This was all irrigated and evacuated with a sucker.  He had minimal bilious drainage down in his pelvis.  We were able to pretty thoroughly evaluate his gastrojejunostomy and it appeared to be intact with no evidence of leak or perforation from this area.  We felt like our 2 drains were in good position.  We decided to place a third drain in the left subdiaphragmatic area  and we placed this through the lateral-most 5-mm port site.  We anchored the drain to the skin with a 3-0 nylon stitch and at this point I do not think there was anything else we could do to better control the bile spillage.  The ports were then removed.  The gas was allowed to escape.  The fascial defect was closed with a previously placed Vicryl pursestring stitch and the incisions were closed with staples.  The drains were placed to bulb suction.  The patient tolerated the procedure well.  At the end of the case, all needle, sponge and instrument counts were correct.  The patient will be awakened and taken to recovery room in stable condition.     Ollen Gross. Vernell Morgans, M.D.     PST/MEDQ  D:  04/08/2011  T:  04/09/2011  Job:  [id number]  Electronically Signed by Chevis Pretty III M.D. on 04/12/2011 04:58:43 PM

## 2011-04-13 ENCOUNTER — Inpatient Hospital Stay (HOSPITAL_COMMUNITY): Payer: Medicare Other

## 2011-04-13 ENCOUNTER — Encounter: Payer: Medicare Other | Admitting: Internal Medicine

## 2011-04-13 LAB — BASIC METABOLIC PANEL
Chloride: 105 mEq/L (ref 96–112)
GFR calc Af Amer: >60 mL/min (ref >60)
GFR calc non Af Amer: >60 mL/min (ref >60)
Potassium: 3 mEq/L — ABNORMAL LOW (ref 3.5–5.1)

## 2011-04-13 LAB — CBC
Platelets: 178 10*3/uL (ref 150–400)
RBC: 4.29 MIL/uL (ref 4.22–5.81)
RDW: 15.6 % — ABNORMAL HIGH (ref 11.5–15.5)
WBC: 7.9 10*3/uL (ref 4.0–10.5)

## 2011-04-13 LAB — PHOSPHORUS: Phosphorus: 3 mg/dL (ref 2.3–4.6)

## 2011-04-13 LAB — MAGNESIUM: Magnesium: 2 mg/dL (ref 1.5–2.5)

## 2011-04-13 MED ORDER — IOHEXOL 300 MG/ML  SOLN
150.0000 mL | Freq: Once | INTRAMUSCULAR | Status: AC | PRN
  Administered 2011-04-13: 150 mL via ORAL

## 2011-04-14 LAB — CBC
Hemoglobin: 12.2 g/dL — ABNORMAL LOW (ref 13.0–17.0)
MCH: 28.3 pg (ref 26.0–34.0)
MCHC: 31.9 g/dL (ref 30.0–36.0)
Platelets: 175 10*3/uL (ref 150–400)
RBC: 4.31 MIL/uL (ref 4.22–5.81)

## 2011-04-14 LAB — BASIC METABOLIC PANEL
CO2: 29 mEq/L (ref 19–32)
Calcium: 8.6 mg/dL (ref 8.4–10.5)
Creatinine, Ser: 0.76 mg/dL (ref 0.4–1.5)
GFR calc Af Amer: >60 mL/min (ref >60)
Sodium: 140 mEq/L (ref 135–145)

## 2011-04-15 ENCOUNTER — Encounter: Payer: Self-pay | Admitting: Internal Medicine

## 2011-04-18 ENCOUNTER — Institutional Professional Consult (permissible substitution): Payer: Medicare Other | Admitting: Internal Medicine

## 2011-04-18 LAB — COMPREHENSIVE METABOLIC PANEL
ALT: 21 U/L (ref 0–53)
AST: 21 U/L (ref 0–37)
Alkaline Phosphatase: 85 U/L (ref 39–117)
CO2: 29 mEq/L (ref 19–32)
Chloride: 104 mEq/L (ref 96–112)
GFR calc Af Amer: >60 mL/min (ref >60)
GFR calc non Af Amer: >60 mL/min (ref >60)
Glucose, Bld: 119 mg/dL — ABNORMAL HIGH (ref 70–99)
Potassium: 4.5 mEq/L (ref 3.5–5.1)
Sodium: 137 mEq/L (ref 135–145)
Total Bilirubin: 0.7 mg/dL (ref 0.3–1.2)

## 2011-04-18 LAB — CBC
Hemoglobin: 12.6 g/dL — ABNORMAL LOW (ref 13.0–17.0)
MCH: 28.4 pg (ref 26.0–34.0)
Platelets: 301 10*3/uL (ref 150–400)
RBC: 4.44 MIL/uL (ref 4.22–5.81)
WBC: 8.2 10*3/uL (ref 4.0–10.5)

## 2011-04-18 LAB — AMYLASE, BODY FLUID

## 2011-04-19 LAB — CBC
MCV: 87.7 fL (ref 78.0–100.0)
Platelets: 307 10*3/uL (ref 150–400)
RBC: 4.31 MIL/uL (ref 4.22–5.81)
RDW: 15.5 % (ref 11.5–15.5)
WBC: 9.7 10*3/uL (ref 4.0–10.5)

## 2011-04-21 LAB — BODY FLUID CULTURE

## 2011-04-25 ENCOUNTER — Other Ambulatory Visit: Payer: Self-pay | Admitting: Oncology

## 2011-04-25 ENCOUNTER — Encounter (HOSPITAL_BASED_OUTPATIENT_CLINIC_OR_DEPARTMENT_OTHER): Payer: Medicare Other | Admitting: Oncology

## 2011-04-25 DIAGNOSIS — D45 Polycythemia vera: Secondary | ICD-10-CM

## 2011-04-25 LAB — CBC WITH DIFFERENTIAL/PLATELET
BASO%: 0.3 % (ref 0.0–2.0)
Basophils Absolute: 0 10*3/uL (ref 0.0–0.1)
EOS%: 1.1 % (ref 0.0–7.0)
HGB: 12.7 g/dL — ABNORMAL LOW (ref 13.0–17.1)
MCH: 28.2 pg (ref 27.2–33.4)
MONO#: 1.2 10*3/uL — ABNORMAL HIGH (ref 0.1–0.9)
RDW: 15.6 % — ABNORMAL HIGH (ref 11.0–14.6)
WBC: 12.2 10*3/uL — ABNORMAL HIGH (ref 4.0–10.3)
lymph#: 1.3 10*3/uL (ref 0.9–3.3)

## 2011-04-25 NOTE — Consult Note (Signed)
NAME:  Jesus Sanders, Jesus Sanders NO.:  [id number]  MEDICAL RECORD NO.:  [id number]           PATIENT TYPE:  I  LOCATION:  5158                         FACILITY:  [facility]  PHYSICIAN:  Lennie Muckle, MD      DATE OF BIRTH:  04/07/1936  DATE OF CONSULTATION:  04/04/2011 DATE OF DISCHARGE:                                CONSULTATION   REASON FOR CONSULT:  Question cholecystitis.  REQUESTING PHYSICIAN:  Dr. Ignacia Palma in [facility].  Jesus Sanders is a pleasant 75 year old male who was admitted for recurrent pneumonia as well as question of cholecystitis.  Apparently, he states he has had 3 weeks' duration of intermittent epigastric discomfort.  He describes it as a pain across his upper abdomen and lower chest.  He states the pain can last anywhere from 5 minutes to 3 hours.  The longest duration was this morning which was 3 hours.  He states the episodes are not related to eating.  They are very intermittent in nature.  They have awoken him from his sleep.  He does not have any related shortness of breath.  He does have what was described as cold sweats but no documented fevers or chills.  He has no nausea or vomiting and no diarrhea.  He states that it usually resolves on its own.  He has required morphine in the emergency department for the pain recently.  He recently was diagnosed with pneumonia in April and has been treated with antibiotics.  The chest x-ray today reveals continued consolidation of the right lobe.  No chest pain.  No pain in his back.  No jaundice.  No history of reflux.  His WBC is 9.2.  PAST MEDICAL HISTORY:  History of COPD, remote history of cerebrovascular accident with no compromise, pneumonia, polycythemia vera.  SURGICAL HISTORY:  Previous hernia repair.  SOCIAL HISTORY:  He quit smoking in April.  No alcohol use.  Family lives close by.  He is retired.  No drug allergies.  Medications include aspirin.  Review of systems are  negative other than the HPI.  PHYSICAL EXAMINATION:  He is pleasant, lying in a stretcher in no acute distress.  Temperature is 98.1, blood pressure 149/83, pulse 80, respiratory rate is 18.  Head is normocephalic.  The extraocular muscles are intact.  Pupils are equal and round.  Sclerae and conjunctivae are clear.  Neck is supple.  No tenderness.  Trachea is midline. Respiratory exam is normal expansion and excursion.  Diminished lung fields in the right lung base, clear on the left.  Cardiovascular exam reveals regular rate and rhythm.  No murmurs, gallops, or rubs.  Pulses palpated in the upper and lower extremities.  Abdomen is nondistended, nontender.  No organomegaly.  No masses.  Skin is warm to the touch.  No jaundice.  There are telangiectasias on the base.  Chest x-ray, two view, there is blunting in the right lung base and consolidation.  CT also revealed consolidation of the lung base.  There seems to be some thickening of perhaps fluids around the gallbladder. The serum chemistries are within normal limits.  Liver  enzymes are normal.  Albumin is low at 2.7.  ASSESSMENT AND PLAN:  Question acute cholecystitis along with persistent ammonia.  There could be persistent lung disease which confirms the picture; however, he has been documented with pneumonia in the past. Given the fact that he has lung disease, I discussed the risk of doing cholecystectomy at present time would outweigh the benefit.  I think he can be treated empirically with antibiotics until his lung issue is sorted out.  Currently, he is in no acute distress.  No significant abdominal pain.  I will go ahead and get an ultrasound to see whether or not he has some stones and to go ahead and documented perhaps wall thickening.  I will allow him to eat tonight and have the surgical team follow up in the morning and follow up.     Lennie Muckle, MD     ALA/MEDQ  D:  04/04/2011  T:  04/05/2011  Job:   [id number]  cc:   [facility].  Electronically Signed by Bertram Savin MD on 04/25/2011 12:16:59 PM

## 2011-04-26 ENCOUNTER — Other Ambulatory Visit (INDEPENDENT_AMBULATORY_CARE_PROVIDER_SITE_OTHER): Payer: Self-pay | Admitting: General Surgery

## 2011-04-26 DIAGNOSIS — K631 Perforation of intestine (nontraumatic): Secondary | ICD-10-CM

## 2011-04-28 ENCOUNTER — Ambulatory Visit
Admission: RE | Admit: 2011-04-28 | Discharge: 2011-04-28 | Disposition: A | Discharge status: Home / Self Care | Payer: Medicare Other | Source: Ambulatory Visit | Attending: General Surgery | Admitting: General Surgery

## 2011-04-28 DIAGNOSIS — K631 Perforation of intestine (nontraumatic): Secondary | ICD-10-CM

## 2011-04-28 MED ORDER — IOHEXOL 300 MG/ML  SOLN
100.0000 mL | Freq: Once | INTRAMUSCULAR | Status: AC | PRN
  Administered 2011-04-28: 100 mL via INTRAVENOUS

## 2011-05-03 ENCOUNTER — Ambulatory Visit (INDEPENDENT_AMBULATORY_CARE_PROVIDER_SITE_OTHER): Payer: Medicare Other | Admitting: General Surgery

## 2011-05-03 ENCOUNTER — Encounter (INDEPENDENT_AMBULATORY_CARE_PROVIDER_SITE_OTHER): Payer: Self-pay | Admitting: General Surgery

## 2011-05-03 VITALS — BP 134/86 | HR 96 | Temp 96.4°F | Ht 72.0 in | Wt 196.4 lb

## 2011-05-03 DIAGNOSIS — R233 Spontaneous ecchymoses: Secondary | ICD-10-CM | POA: Insufficient documentation

## 2011-05-03 DIAGNOSIS — K631 Perforation of intestine (nontraumatic): Secondary | ICD-10-CM

## 2011-05-03 DIAGNOSIS — R238 Other skin changes: Secondary | ICD-10-CM

## 2011-05-03 DIAGNOSIS — K319 Disease of stomach and duodenum, unspecified: Secondary | ICD-10-CM

## 2011-05-03 NOTE — Patient Instructions (Signed)
No heavy lifting. He may shower.

## 2011-05-03 NOTE — Progress Notes (Signed)
Subjective:     Patient ID: Jesus Sanders, male   DOB: [birth year], 75 y.o.   MRN: [id number]    BP 134/86  Pulse 96  Temp(Src) 96.4 F (35.8 C) (Temporal)  Ht 6' (1.829 m)  Wt 196 lb 6.4 oz (89.086 kg)  BMI 26.64 kg/m2    HPI Jesus Sanders is a 75 year old white male who is now a little over 3 weeks out from a open cholecystectomy. At the time of the surgery was found to have a perforated duodenum. We were unable to close it so we had to do a duodenal exclusion and gastrojejunostomy. He has had 2 drains in and they are both draining minimal amounts of fluid. His appetite is good. He is not complaining of any pain. His bowels are moving regularly.  Review of Systems     Objective:   Physical Exam On physical exam the patient'omen is soft and nontender. His drains had minimal output. We removed his drains today he tolerated this well.    Assessment:     This is a 75 year old white male who is about 3 weeks out from a open cholecystectomy and repair of a duodenal perforation.    Plan:     The plan will be to see him back in about 2-3 weeks.

## 2011-05-05 ENCOUNTER — Telehealth: Payer: Self-pay | Admitting: *Deleted

## 2011-05-05 NOTE — Telephone Encounter (Signed)
Pt daughter called in and states pt wants O2 removed from his house.  They need a MD order to do this. He asked about this 2 months ago and you wanted to see him first.  He does not want to wait until 7/11 because it cost $$ to have this in his house. Please advise. Pt # [phone]

## 2011-05-05 NOTE — Discharge Summary (Signed)
NAME:  Jesus Sanders, Jesus Sanders NO.:  [id number]  MEDICAL RECORD NO.:  [id number]  LOCATION:  5155                         FACILITY:  [facility]  PHYSICIAN:  Ardeth Sportsman, MD     DATE OF BIRTH:  Dec 03, 1935  DATE OF ADMISSION:  04/04/2011 DATE OF DISCHARGE:  04/19/2011                              DISCHARGE SUMMARY   ADMISSION DIAGNOSES: 1. Abdominal pain. 2. Chronic obstructive pulmonary disease with history of tobacco use. 3. Right lower lobe opacity. 4. Polycythemia vera. 5. History of hernia repair.  DISCHARGE DIAGNOSES: 1. Duodenal perforation. 2. Cholecystitis/cholelithiasis. 3. Questionable pneumonia with right lower lobe density, now followed     by Dr. Sherene Sires, Pulmonary. 4. Chronic obstructive pulmonary disease with history of tobacco use. 5. Polycythemia vera. 6. History of hernia repair. 7. Left adrenal nodule.  PROCEDURES: 1. Right lower lobe airspace density related to inflammatory process     such as pneumonia or scarring that is extensive adjacent pleural     calcification, underlying neoplasm was a possible differential.     Gallbladder thickening and pericholecystic edema suggesting acute     cholecystitis.  Left adrenal nodule, most likely adrenal adenoma. 2. Abdominal ultrasound, Apr 05, 2011 shows cholelithiasis, thickened     gallbladder wall but no pericholecystic fluid.  No Murphy sign. 3. Aborted laparoscopy, open cholecystectomy, repair of duodenal     perforation, duodenal exclusion, and gastrojejunostomy antecolic on     Apr 06, 2011, Dr. Carolynne Edouard. 4. Exploratory laparoscopy and drainage of bowel peritonitis, April 09, 2011, Dr. Carolynne Edouard.  BRIEF HISTORY:  The patient is a 75 year old gentleman with a history of tobacco use and polycythemia who presented to the [facility] where he was found to have abdominal pain that lasted for 3 hours, describes the pain as sharp and located in the right upper quadrant. Pain was 7-8/10 with no  radiation or aggravating or relieving factors. It was associated with cold sweats but no shortness of breath, nausea, or vomiting.  He was admitted by Teaching Service at that point.  He was seen on the evening of Apr 04, 2011 by Dr. Bertram Savin.  She noted he had been admitted for recurrent pneumonia as well as a question of cholecystitis.  Again, history of 3 weeks of intermittent epigastric discomfort.  Pain lasts anywhere from 5 minutes to 3 hours.  He noted the episodes were not related to eating.  It was her opinion after evaluation that there was a question of acute cholecystitis with a persistent pneumonia.  The patient has persistent lung disease.  He has a history of pneumonia also.  She recommended that he will have to eat tonight and will be followed in the a.m.  On Apr 05, 2011 in the a.m., he was still having pain.  He was afebrile.  Vital signs were stable. LFTs were normal.  CT of the abdomen showed some apical emphysema, right lower lobe consolidation, and a 7.4 x 6 mm density in the right lower lobe.  At that point, we recommended he be seen by Pulmonary while awaiting results of the abdominal ultrasounds.  He was seen in consultation by Pulmonary CCU,  Dr. Sherene Sires and Mr. Zenia Resides.  It was their opinion that the patient had a right lower lobe cavitary lung mass, question whether it was an abscess versus possible cancer.  He was reviewed by Dr. Sherene Sires and he recommended that he initially be treated with antibiotics for 10 days and then followup with an office visit in 4 weeks.  The patient remained hemodynamically stable and recommendation for cholecystectomy was made and we planned to proceed with surgery on Apr 06, 2011.  As noted above, I attempted to do a laparoscopy but had to open and he was found to have a perforation of his duodenum and underwent the above-noted procedure.  After the duodenum was repaired, he also underwent cholecystectomy.  He was transferred to  the Step-Down Care Unit and did well on the first postoperative day.  Second postoperative day he was found to be tachycardic.  His blood pressure was stable but he was becoming somewhat hypertensive.  We treated him with fluid but with no resolution.  He had a white count 13,000. Bilirubin was elevated but electrolytes were normal.  He had 2 drains in place, the first one was full of green bilious drainage.  The second one only had 30 mL reported.  He lost his NG tube and that was reinserted. Because of his ongoing tachycardia and mild hypotension, he was taken back to the OR for evaluation.  This was done laparoscopically.  The area where the perforation of the duodenum was, was dense and sticky with omentum and the medial drain was sitting right on top of this area. The lateral drain was in the right upper gutter up and over the liver and was catching bile that was leaking.  He had a collection of bile over his spleen on the left side.  This was irrigated and a third drain was placed in this area.  He was closed and returned to the Step-Down Unit.  He did well over the next several days and stabilized.  He had some pulmonary problems with increased congestion.  He also had some problems with abdominal distention and tenderness but overall made pretty good progress.  Chest x-ray obtained on April 13, 2011 showed bilateral atelectasis/pneumonia, pleural effusion, and again the right side was worse than the left.  He was maintained on pulmonary toilet. He also underwent upper GI with KUB.  This showed no evidence for duodenal extravasation or obstruction.  There was prompt emptying of the gastric contents into the duodenum.  The proximal jejunum was positioned to the right of the midline.  From this point, the patient was transferred to the floor, April 15, 2011.  The #2 drain was removed prior to transfer.  On April 16, 2011, he had some increased drainage.  There was significant bilious  output at that time but he remained afebrile and hemodynamically stable.  On April 17, 2011, he had good urine output.  JP #1 had 15 mL out and JP #2 had 30 mL out over in 8 hours.  At this point, he had been advanced to a regular diet and was doing fairly well.  On April 18, 2011, he had 50 mL out through JP #1 and 365 mL out through JP #2.  He also noted that he had not had any bowel movement for 3 days.  The drainage from JP #1 was cloudy and kind of a yellow-greenish color different than what he had previously. This was discussed with Dr. Michaell Cowing who ordered cultures along with  amylase of the serum and of the drainage.  The serum amylase was 77, fluid from the drain showed greater than 10,000 units per liter.  CMP showed the electrolytes to be normal.  Creatinine 0.81, BUN was 10. LFTs were normal with total bilirubin of 0.7, alk phos 85, SGOT of 21, and SGPT of 21.  CBC showed a white count 8.2, hemoglobin of 12.6, and hematocrit of 39.  He was seen on that day by Dr. Andrey Campanile, who recommended a CBC for the a.m.  Following a.m., April 18, 2011, he had 20 mL out through JP #1 and 10 mL from midnight to 8:00 a.m.  JP #3 had a total of 190 mL out, however, 24-hour.  He remained afebrile.  White count was 9.7, hemoglobin 12, hematocrit 37, and platelets 307,000.  He had a bowel movement.  The culture of Gram stain showed squamous cell with Gram-negative rods, Gram-positive cocci, and a few yeast.  He remained nontender.  Drainage was unchanged and it was Dr. Michaell Cowing' opinion that he could be discharged on Augmentin and Diflucan for 5 days.  He is instructed to keep a record of his drainage and instructed how to do that.  He can call if there is any increased drainage, any fever, increased abdominal tenderness, or malaise or fatigue.  He is scheduled to come back and see Dr. Carolynne Edouard on Tuesday, April 26, 2011 at 2:20.  He is to contact Dr. Sherene Sires for followup in 2 weeks at [phone].  DISCHARGE  MEDICATIONS: 1. Protonix 40 mg daily. 2. Diflucan 100 mg daily for 5 days. 3. Augmentin 875/125 one b.i.d. for 5 days. 4. Percocet 1-2 p.o. q.4 h. p.r.n.  CONDITION ON DISCHARGE:  Improving.     Eber Hong, P.A.   ______________________________ Ardeth Sportsman, MD    WDJ/MEDQ  D:  04/19/2011  T:  04/20/2011  Job:  [id number]  cc:   Charlaine Dalton. Sherene Sires, MD, [facility] Elyse Jarvis, MD  Electronically Signed by Sherrie George P.A. on 04/26/2011 07:48:51 PM Electronically Signed by Karie Soda MD on 05/05/2011 07:13:33 AM

## 2011-05-09 ENCOUNTER — Institutional Professional Consult (permissible substitution): Payer: Medicare Other | Admitting: Emergency Medicine

## 2011-05-09 ENCOUNTER — Other Ambulatory Visit: Payer: Self-pay | Admitting: Oncology

## 2011-05-09 ENCOUNTER — Encounter (HOSPITAL_BASED_OUTPATIENT_CLINIC_OR_DEPARTMENT_OTHER): Payer: Medicare Other | Admitting: Oncology

## 2011-05-09 DIAGNOSIS — D45 Polycythemia vera: Secondary | ICD-10-CM

## 2011-05-09 LAB — CBC WITH DIFFERENTIAL/PLATELET
Eosinophils Absolute: 0.1 10*3/uL (ref 0.0–0.5)
HGB: 13.4 g/dL (ref 13.0–17.1)
MONO#: 0.7 10*3/uL (ref 0.1–0.9)
NEUT#: 5.9 10*3/uL (ref 1.5–6.5)
Platelets: 228 10*3/uL (ref 140–400)
RBC: 4.93 10*6/uL (ref 4.20–5.82)
RDW: 15.9 % — ABNORMAL HIGH (ref 11.0–14.6)
WBC: 8.8 10*3/uL (ref 4.0–10.3)

## 2011-05-18 ENCOUNTER — Encounter: Payer: Medicare Other | Admitting: Internal Medicine

## 2011-05-19 ENCOUNTER — Encounter (INDEPENDENT_AMBULATORY_CARE_PROVIDER_SITE_OTHER): Payer: Self-pay | Admitting: General Surgery

## 2011-05-19 ENCOUNTER — Ambulatory Visit (INDEPENDENT_AMBULATORY_CARE_PROVIDER_SITE_OTHER): Payer: Medicare Other | Admitting: General Surgery

## 2011-05-19 DIAGNOSIS — K631 Perforation of intestine (nontraumatic): Secondary | ICD-10-CM

## 2011-05-19 DIAGNOSIS — K319 Disease of stomach and duodenum, unspecified: Secondary | ICD-10-CM

## 2011-05-19 NOTE — Progress Notes (Signed)
Subjective:     Patient ID: Jesus Sanders, male   DOB: 1936/06/17, 75 y.o.   MRN: [id number]    There were no vitals taken for this visit.    HPI The patient is a 75 year old white male who is now a little over a month out from a repair of a duodenal perforation with pyloric exclusion and gastrojejunostomy. We removed his last 2 drains a couple weeks ago. Since then he has done reasonably well. His only complaint is of some discomfort in his left upper quadrant. This has not gotten worse since the drains were removed. He denies any fevers. His appetite has been good. His bowels are working normally.  Review of Systems     Objective:   Physical Exam On exam his abdomen is soft. He has some minor left upper quadrant discomfort. He seems to have a fluid wave on palpation. His abdomen is completely nontender. His incisions have healed well.    Assessment:     Postop from perforated duodenum.    Plan:     Since he does not appear to be sick we will wait and see how he does for the next month. I suspect the fluid is just serous fluid which his body may take care of on its own.

## 2011-05-19 NOTE — Patient Instructions (Signed)
FU in 1 month

## 2011-05-23 NOTE — Discharge Summary (Signed)
NAME:  Jesus Sanders, Jesus Sanders NO.:  [id number]  MEDICAL RECORD NO.:  [id number]           PATIENT TYPE:  E  LOCATION:  [facility]                         FACILITY:  [facility]  PHYSICIAN:  Fransisco Hertz, M.D.  DATE OF BIRTH:  07-Jun-1936  DATE OF ADMISSION:  03/04/2011 DATE OF DISCHARGE:                              DISCHARGE SUMMARY   DISCHARGE DIAGNOSES: 1. Community-acquired pneumonia right lower lobe infiltrate on chest x-     ray with productive cough. 2. Chronic bronchitis on chest x-ray. 3. with history of cerebrovascular accident in 1996 and saphenous vein     thrombosis in 2001.  Was followed by Dr. Darnelle Catalan at the [facility] but is not being followed anymore since last 4 years.     Not on any antiplatelets or anticoagulants.  DISCHARGE MEDICATIONS: 1. Nicotine patch 40 mg daily. 2. Allegra over-the-counter daily for allergy. 3. Tylenol 650 mg p.o. q.8 h. p.r.n. for pain. 4. Avelox 400 mg tablets 1 tablet p.o. daily for total of 4 days, more     to complete a week of therapy. 5. Aspirin 81 mg p.o. daily. 6. albuterol inhaler- 1-2 puffs q4 hr PRN for SOB.  DISPOSITION AND FOLLOWUP:  Jesus Sanders was to be discharged in a relatively stable condition.  He is to be followed up with Dr. Allena Katz at [facility] on Mar 11, 2011, at 10:30 p.m. during the followup visit.  We will reassess for his cough and clinical improvement and also we will assess for oxygen saturation and oxygen requirement and he is to be sent home on home oxygen at 2 L O2.  We also arranged for outpatient pulmonary consult if felt needed at the time of followup.  PROCEDURES PERFORMED: 1. Chest x-ray two-view March 04, 2011, impression, acute right lower     lobe airspace disease, consistent with pneumonia.  Posttreatment     radiographic followup recommended to confirm resolution.  Chronic     COPD and right pleural thickening. 2. Respiratory culture showing normal  oropharyngeal flora and prelim     blood cultures at the time of discharge showing no growth to date,     final pending.  CHIEF COMPLAINT:  Cough and shortness of breath.  HISTORY OF PRESENT ILLNESS:  Jesus Sanders is a 75 year old man with past medical history of polycythemia vera, three pneumonia with history of intubation, who comes to the ED with chief complaint of cough and shortness of breath since 4-5 days.  He complains of having cough which is productive of yellowish-white sputum for above period of  time it was getting worse.  He denies any fever or chills or any chest pain.  No sick contacts or travel history.  No antibiotics used in last few years. Did try some over-the-counter allergy medications at home which did not work.  No other medications of inhalers he used at home.  Continued to smoke while sick.  No known drug allergies.  PHYSICAL EXAMINATION ON ADMISSION:  VITAL SIGNS:  On admission, blood pressure 142/75, pulse rate 103, respiratory rate 26, temperature 99, sats  94% on 4 L of O2 dropping in mid 80s without oxygen. GENERAL:  NAD  diastolic.  Short of breath at times when talking, but talks in full sentences. HEENT:  NCAT, PERRL, EOMI.  No icterus.  No pallor. NECK:  Supple.  No JVD. LUNGS:  Decreased sounds in bilateral bases but otherwise clear to auscultation bilaterally. CVS:  S1 and S2, tachycardic.  No murmurs, rubs, or gallops. ABDOMEN:  Soft, nontender, nondistended.  NG had not BS positive. EXTREMITIES:  No pedal edema,  capillary some skin and legs, face, and back. NEUROLOGIC:  Alert and oriented x3, nonfocal.  No focal motor or sensory deficits.  Strength 5/5 in bilateral lower extremities and 5/5 bilateral upper extremities.  LABORATORY DATA ON ADMISSION:  CBC, WBC 15.9, hemoglobin 18.2, hematocrit 53.3, platelets 238, ANC 13.9.  Sodium 132, potassium 4.4, chloride 97, bicarb 24, BUN 28, creatinine 1.3, glucose 107.  GFR 54. Total bilirubin 0.7,  alk phos 68, AST 38, ALT 32, total protein 6.7, albumin 2.5.  Calcium 9.3.  ABG, pH of 7.453, pCO2 33.5, pO2 64, bicarb 23.5.  Total carbon dioxide 24, oral saturation 93%.  HOSPITAL COURSE BY PROBLEM: 1. Community-acquired pneumonia.  The patient was admitted with     diagnosis of community-acquired pneumonia considering his chest x-     ray findings of new right lower lobe infiltrate along with cough     productive of yellowish to white sputum.  Although he was afebrile,     he was hypoxic on admission and he continued to be hypoxic during     the hospital stay dropping his sats to mid 80s without nasal     cannula.  On the day of discharge, he was sating 88% on room air     and got up to mid 90s with 2 L of nasal cannula, so considering     this he qualified for getting his home oxygen and so arranged for 2     L of home oxygen continuous until the clinic visit.  We will     reevaluate for the need.  For his community-acquired pneumonia, he     was started on IV azithromycin and ceftriaxone and was continued on     that for 3 days until the discharge but he was changed to p.o.     Avelox and sent home with more 4 days, completing total 7 days of     antibiotic treatment.  We will follow up with a chest x-ray in 4-6     weeks for radiographic resolution along with a clinical resolution     of pneumonia.  Of note, he remained afebrile during the whole     hospital stay and slowly got better before the time of discharge. 2. Questionable COPD.  Jesus Sanders does not have a diagnosis of COPD as     of clinical diagnosis but does have chronic COPD changes on chest x-     ray since 2001 as that is the first x-ray I can access in the     system.  He is not wheezing at all during the admission or during     the hospital course, but still was continued on DuoNebs to open up     the airways and clear up the sputum from the airways.  He was sent     home with albuterol inhaler every 4 hours as  needed for shortness     of breath as he gets short  of breath if he takes this himself more     than usual.  We will reevaluate for need for a pulmonology referral     as an outpatient for performing PFTs for diagnosis of COPD are     ruling it out.. 3. Polycythemia vera.  He has a diagnosis of polycythemia vera for     many years and was followed by Dr. Darnelle Catalan at [facility] in [place].  He is to get monthly phlebotomies until up     to 4 years before when he stopped taking the treatment.  At     present, he does not have any signs or symptoms of thrombophlebitis     or any clots anywhere in the body but explained to him the risk of     not being on any antiplatelet agent and plain baby aspirin would     even help to prevent future strokes.  He accepted and was started     on aspirin.  Also concerning for his treatment for PCV, he also     accepted to try to go back to Dr. Darnelle Catalan or other hematologist to     get phlebotomies or any other treatment for his polycythemia vera.     We will arrange for his outpatient appointment with the [facility] during the outpatient clinic visit. 4. Tobacco abuse.  He smokes cigarettes since he was 75 years old, so     has about 58-60 pack-year history.  I explained him about the risk     factors and his likely diagnosis of COPD wants to stop smoking.  He     seems motivated and wanted to get nicotine patch because it helped     him in the hospital, so we will prescribe a nicotine patch once he     goes home.  We will follow up in the clinic and reiterated the     importance of smoking cessation and trying him to quit smoking in     all the possible ways.  DISCHARGE VITALS:  Temperature 98.3, pulse 100, respirations 20, blood pressure 146/93, O2 sat 91% on room air.  DISCHARGE LABORATORY DATA:  CBC, WBC 15.9, hemoglobin 17, hematocrit 50, platelets 248.  BMET, sodium 132, potassium 3.9, chloride 93, bicarb 26, BUN  27, creatinine 1.13, glucose 102.  GFR more than 60.  Calcium 9.2 on March 06, 2011.    ______________________________ Lyn Hollingshead, MD   ______________________________ Fransisco Hertz, M.D.    RP/MEDQ  D:  03/09/2011  T:  03/10/2011  Job:  [id number]  cc:   Fransisco Hertz, M.D.  Electronically Signed by Lyn Hollingshead MD on 03/26/2011 79:89:21 PM Electronically Signed by Lina Sayre M.D. on 05/23/2011 03:24:09 PM

## 2011-06-06 ENCOUNTER — Encounter: Payer: Medicare Other | Admitting: Oncology

## 2011-06-06 ENCOUNTER — Other Ambulatory Visit: Payer: Self-pay | Admitting: Oncology

## 2011-06-06 DIAGNOSIS — D45 Polycythemia vera: Secondary | ICD-10-CM

## 2011-06-06 LAB — CBC WITH DIFFERENTIAL/PLATELET
Basophils Absolute: 0 10*3/uL (ref 0.0–0.1)
Eosinophils Absolute: 0.1 10*3/uL (ref 0.0–0.5)
HCT: 44.4 % (ref 38.4–49.9)
HGB: 14.2 g/dL (ref 13.0–17.1)
LYMPH%: 15 % (ref 14.0–49.0)
MONO#: 0.8 10*3/uL (ref 0.1–0.9)
NEUT#: 6.2 10*3/uL (ref 1.5–6.5)
NEUT%: 73.9 % (ref 39.0–75.0)
Platelets: 223 10*3/uL (ref 140–400)
WBC: 8.3 10*3/uL (ref 4.0–10.3)

## 2011-06-20 ENCOUNTER — Encounter (INDEPENDENT_AMBULATORY_CARE_PROVIDER_SITE_OTHER): Payer: Medicare Other | Admitting: General Surgery

## 2011-06-21 ENCOUNTER — Telehealth (INDEPENDENT_AMBULATORY_CARE_PROVIDER_SITE_OTHER): Payer: Self-pay | Admitting: General Surgery

## 2011-06-21 ENCOUNTER — Emergency Department (HOSPITAL_COMMUNITY)
Admission: EM | Admit: 2011-06-21 | Discharge: 2011-06-22 | Discharge status: Against Medical Advice | Payer: Medicare Other | Attending: Emergency Medicine | Admitting: Emergency Medicine

## 2011-06-21 DIAGNOSIS — R109 Unspecified abdominal pain: Secondary | ICD-10-CM | POA: Insufficient documentation

## 2011-06-21 NOTE — Telephone Encounter (Signed)
Jesus Sanders contacted the office stating he has tremendous pain in the left side where his drain was, and having swelling and tightness at the site. He admits to having to use a lot of pain medication to control the pain. Call placed to Dr Carolynne Edouard for advise. He advised pt to go to the ED for evaluation. Contacted the patient back and advised him of this and he stated he will head to the ED at [facility].

## 2011-06-22 ENCOUNTER — Emergency Department (HOSPITAL_COMMUNITY): Payer: Medicare Other

## 2011-06-22 ENCOUNTER — Telehealth (INDEPENDENT_AMBULATORY_CARE_PROVIDER_SITE_OTHER): Payer: Self-pay

## 2011-06-22 ENCOUNTER — Emergency Department (HOSPITAL_COMMUNITY)
Admission: EM | Admit: 2011-06-22 | Discharge: 2011-06-22 | Disposition: A | Discharge status: Home / Self Care | Payer: Medicare Other | Attending: Emergency Medicine | Admitting: Emergency Medicine

## 2011-06-22 DIAGNOSIS — R109 Unspecified abdominal pain: Secondary | ICD-10-CM | POA: Insufficient documentation

## 2011-06-22 DIAGNOSIS — Z8673 Personal history of transient ischemic attack (TIA), and cerebral infarction without residual deficits: Secondary | ICD-10-CM | POA: Insufficient documentation

## 2011-06-22 DIAGNOSIS — J449 Chronic obstructive pulmonary disease, unspecified: Secondary | ICD-10-CM | POA: Insufficient documentation

## 2011-06-22 DIAGNOSIS — J4489 Other specified chronic obstructive pulmonary disease: Secondary | ICD-10-CM | POA: Insufficient documentation

## 2011-06-22 LAB — URINALYSIS, ROUTINE W REFLEX MICROSCOPIC
Glucose, UA: NEGATIVE mg/dL
Ketones, ur: 15 mg/dL — AB
Leukocytes, UA: NEGATIVE
pH: 5 (ref 5.0–8.0)

## 2011-06-22 LAB — COMPREHENSIVE METABOLIC PANEL
AST: 57 U/L — ABNORMAL HIGH (ref 0–37)
BUN: 20 mg/dL (ref 6–23)
CO2: 23 mEq/L (ref 19–32)
Chloride: 101 mEq/L (ref 96–112)
Creatinine, Ser: 0.81 mg/dL (ref 0.50–1.35)
GFR calc non Af Amer: >60 mL/min (ref >60)
Total Bilirubin: 0.4 mg/dL (ref 0.3–1.2)

## 2011-06-22 LAB — CBC
HCT: 43.4 % (ref 39.0–52.0)
MCV: 80.4 fL (ref 78.0–100.0)
Platelets: 374 10*3/uL (ref 150–400)
RBC: 5.4 MIL/uL (ref 4.22–5.81)
WBC: 10.7 10*3/uL — ABNORMAL HIGH (ref 4.0–10.5)

## 2011-06-22 LAB — LACTIC ACID, PLASMA: Lactic Acid, Venous: 1.3 mmol/L (ref 0.5–2.2)

## 2011-06-22 MED ORDER — IOHEXOL 300 MG/ML  SOLN: 80.0000 mL | Freq: Once | INTRAMUSCULAR | Status: DC | PRN

## 2011-06-22 NOTE — Telephone Encounter (Signed)
Pts daughter Jesus Sanders called stating "my dad went to ER but would not wait to be seen". Pt was directed by Dr Carolynne Edouard yesterday to go to ER.  She states pt still in pain at drain site with some swelling. No fever. Eating well. Drinking well. Voiding well. Has had normal BM with in 24 hours. I paged Dr Luisa Hart  And reviewed pts concerns. Per Dr Luisa Hart pt is directed back to [facility] ER. Pts daughter will advise pt and take him back to [facility] ER.

## 2011-06-23 ENCOUNTER — Encounter (INDEPENDENT_AMBULATORY_CARE_PROVIDER_SITE_OTHER): Payer: Self-pay | Admitting: General Surgery

## 2011-06-23 ENCOUNTER — Ambulatory Visit (INDEPENDENT_AMBULATORY_CARE_PROVIDER_SITE_OTHER): Payer: Medicare Other | Admitting: General Surgery

## 2011-06-23 VITALS — BP 114/80 | HR 96 | Temp 96.6°F

## 2011-06-23 DIAGNOSIS — K319 Disease of stomach and duodenum, unspecified: Secondary | ICD-10-CM

## 2011-06-23 DIAGNOSIS — K631 Perforation of intestine (nontraumatic): Secondary | ICD-10-CM

## 2011-06-23 MED ORDER — OMEPRAZOLE 20 MG PO CPDR: 20.0000 mg | DELAYED_RELEASE_CAPSULE | Freq: Two times a day (BID) | ORAL | Status: DC

## 2011-06-23 NOTE — Patient Instructions (Addendum)
Start prilosec twice a day Refer to Gastroenterology Upper GI with small bowel follow thru F/U in 2 weeks

## 2011-06-23 NOTE — Telephone Encounter (Signed)
I agree

## 2011-06-24 ENCOUNTER — Other Ambulatory Visit: Payer: Self-pay | Admitting: Oncology

## 2011-06-24 ENCOUNTER — Encounter (HOSPITAL_BASED_OUTPATIENT_CLINIC_OR_DEPARTMENT_OTHER): Payer: Medicare Other | Admitting: Oncology

## 2011-06-24 DIAGNOSIS — Z86718 Personal history of other venous thrombosis and embolism: Secondary | ICD-10-CM

## 2011-06-24 DIAGNOSIS — D45 Polycythemia vera: Secondary | ICD-10-CM

## 2011-06-24 DIAGNOSIS — Z7901 Long term (current) use of anticoagulants: Secondary | ICD-10-CM

## 2011-06-24 LAB — CBC WITH DIFFERENTIAL/PLATELET
BASO%: 1 % (ref 0.0–2.0)
EOS%: 0.9 % (ref 0.0–7.0)
HCT: 40.3 % (ref 38.4–49.9)
LYMPH%: 14 % (ref 14.0–49.0)
MCH: 26.4 pg — ABNORMAL LOW (ref 27.2–33.4)
MCHC: 33.1 g/dL (ref 32.0–36.0)
MCV: 79.7 fL (ref 79.3–98.0)
MONO%: 5.2 % (ref 0.0–14.0)
NEUT%: 78.9 % — ABNORMAL HIGH (ref 39.0–75.0)
Platelets: ADEQUATE 10*3/uL (ref 140–400)
lymph#: 1.3 10*3/uL (ref 0.9–3.3)

## 2011-06-27 ENCOUNTER — Encounter (INDEPENDENT_AMBULATORY_CARE_PROVIDER_SITE_OTHER): Payer: Self-pay | Admitting: General Surgery

## 2011-06-27 NOTE — Progress Notes (Signed)
Subjective:     Patient ID: Jesus Sanders, male   DOB: 16-Aug-1936, 75 y.o.   MRN: [id number]  HPI The patient is a 75 yo wm who is 3 months out from a repair of a perforated duodenum, duodenal exclusion, and cholecystectomy. He had an acute increase in his LUQ pain yesterday. He denies any fever or chill. No nausea or vomiting. He had a CT done yesterday that showed an increase in the amount of inflammation around his anastamosis but no free air or extravasation of fluid.  Review of Systems     Objective:   Physical Exam His abdomen is mostly soft and nontender but he is focally very tender in epigastric area. Incision is well healed. Normal bowel sounds    Assessment:     Inflammation of gastrojejunostomy    Plan:     Start PPI twice a day Refer to GI Order gastrograffin ugi to eval anastamosis F/U in 2 weeks

## 2011-06-29 ENCOUNTER — Ambulatory Visit
Admission: RE | Admit: 2011-06-29 | Discharge: 2011-06-29 | Disposition: A | Discharge status: Home / Self Care | Payer: Medicare Other | Source: Ambulatory Visit | Attending: General Surgery | Admitting: General Surgery

## 2011-06-29 DIAGNOSIS — K631 Perforation of intestine (nontraumatic): Secondary | ICD-10-CM

## 2011-07-04 ENCOUNTER — Encounter (HOSPITAL_BASED_OUTPATIENT_CLINIC_OR_DEPARTMENT_OTHER): Payer: Medicare Other | Admitting: Oncology

## 2011-07-04 ENCOUNTER — Other Ambulatory Visit: Payer: Self-pay | Admitting: Oncology

## 2011-07-04 DIAGNOSIS — Z7901 Long term (current) use of anticoagulants: Secondary | ICD-10-CM

## 2011-07-04 DIAGNOSIS — D45 Polycythemia vera: Secondary | ICD-10-CM

## 2011-07-04 DIAGNOSIS — Z86718 Personal history of other venous thrombosis and embolism: Secondary | ICD-10-CM

## 2011-07-04 LAB — CBC WITH DIFFERENTIAL/PLATELET
BASO%: 0.2 % (ref 0.0–2.0)
Basophils Absolute: 0 10*3/uL (ref 0.0–0.1)
EOS%: 2.2 % (ref 0.0–7.0)
Eosinophils Absolute: 0.2 10*3/uL (ref 0.0–0.5)
HCT: 41.3 % (ref 38.4–49.9)
HGB: 13.1 g/dL (ref 13.0–17.1)
LYMPH%: 17.8 % (ref 14.0–49.0)
MCH: 25 pg — ABNORMAL LOW (ref 27.2–33.4)
MCHC: 31.7 g/dL — ABNORMAL LOW (ref 32.0–36.0)
MCV: 79 fL — ABNORMAL LOW (ref 79.3–98.0)
MONO#: 0.7 10*3/uL (ref 0.1–0.9)
MONO%: 7.8 % (ref 0.0–14.0)
NEUT#: 6.2 10*3/uL (ref 1.5–6.5)
NEUT%: 72 % (ref 39.0–75.0)
Platelets: 355 10*3/uL (ref 140–400)
RBC: 5.23 10*6/uL (ref 4.20–5.82)
RDW: 15.5 % — ABNORMAL HIGH (ref 11.0–14.6)
WBC: 8.6 10*3/uL (ref 4.0–10.3)
lymph#: 1.5 10*3/uL (ref 0.9–3.3)
nRBC: 0 % (ref 0–0)

## 2011-07-07 ENCOUNTER — Ambulatory Visit (INDEPENDENT_AMBULATORY_CARE_PROVIDER_SITE_OTHER): Payer: Medicare Other | Admitting: General Surgery

## 2011-07-07 ENCOUNTER — Encounter (INDEPENDENT_AMBULATORY_CARE_PROVIDER_SITE_OTHER): Payer: Self-pay | Admitting: General Surgery

## 2011-07-07 DIAGNOSIS — K253 Acute gastric ulcer without hemorrhage or perforation: Secondary | ICD-10-CM

## 2011-07-07 NOTE — Patient Instructions (Signed)
Continue the acid reducing meds twice a day Avoid ibuprofen

## 2011-07-09 HISTORY — PX: CARDIAC CATHETERIZATION: SHX172

## 2011-07-09 HISTORY — PX: US ECHOCARDIOGRAPHY: HXRAD669

## 2011-07-12 NOTE — Progress Notes (Signed)
Subjective:     Patient ID: Jesus Sanders, male   DOB: 1936/04/01, 75 y.o.   MRN: [id number]  HPI The patient is a 75 her white male who is about 3 months out from a duodenal exclusion  because of a large perforation of his duodenum. He was doing well but couple weeks ago developed worsening pain in his left upper quadrant. A CT scan showed increasing inflammatory change around his gastrojejunostomy. We started him on twice a day proton pump inhibitors and sent her for an upper endoscopy which did show a large ulcer at the anastomosis. There is been no evidence of perforation. Since his last visit he is feeling a bit better. Review of Systems     Objective:   Physical Exam On exam his abdomen is soft definitely softer. He really has no more tenderness in the left upper quadrant at this point. He has some mild right lower quadrant tenderness. Overall he looks much better.    Assessment:     3 months postop from a duodenal exclusion now complicated by a marginal ulcer    Plan:     Continue twice a day proton pump inhibitors. He will followup in several weeks with the gastroenterologist for possible repeat endoscopy.

## 2011-07-15 ENCOUNTER — Inpatient Hospital Stay (HOSPITAL_COMMUNITY)
Admission: EM | Admit: 2011-07-15 | Discharge: 2011-07-19 | DRG: 248 | Disposition: A | Discharge status: Home / Self Care | Payer: Medicare Other | Source: Ambulatory Visit | Attending: Cardiology | Admitting: Cardiology

## 2011-07-15 ENCOUNTER — Emergency Department (HOSPITAL_COMMUNITY): Payer: Medicare Other

## 2011-07-15 DIAGNOSIS — I2510 Atherosclerotic heart disease of native coronary artery without angina pectoris: Secondary | ICD-10-CM

## 2011-07-15 DIAGNOSIS — Z7902 Long term (current) use of antithrombotics/antiplatelets: Secondary | ICD-10-CM

## 2011-07-15 DIAGNOSIS — I2109 ST elevation (STEMI) myocardial infarction involving other coronary artery of anterior wall: Principal | ICD-10-CM | POA: Diagnosis present

## 2011-07-15 DIAGNOSIS — E876 Hypokalemia: Secondary | ICD-10-CM | POA: Diagnosis present

## 2011-07-15 DIAGNOSIS — D45 Polycythemia vera: Secondary | ICD-10-CM | POA: Diagnosis present

## 2011-07-15 DIAGNOSIS — I5023 Acute on chronic systolic (congestive) heart failure: Secondary | ICD-10-CM | POA: Diagnosis present

## 2011-07-15 DIAGNOSIS — I509 Heart failure, unspecified: Secondary | ICD-10-CM | POA: Diagnosis present

## 2011-07-15 DIAGNOSIS — I519 Heart disease, unspecified: Secondary | ICD-10-CM

## 2011-07-15 DIAGNOSIS — Z7982 Long term (current) use of aspirin: Secondary | ICD-10-CM

## 2011-07-15 DIAGNOSIS — E785 Hyperlipidemia, unspecified: Secondary | ICD-10-CM | POA: Diagnosis present

## 2011-07-15 LAB — POCT I-STAT, CHEM 8
Creatinine, Ser: 0.9 mg/dL (ref 0.50–1.35)
Glucose, Bld: 114 mg/dL — ABNORMAL HIGH (ref 70–99)
Hemoglobin: 14.6 g/dL (ref 13.0–17.0)
Potassium: 4.2 mEq/L (ref 3.5–5.1)
TCO2: 22 mmol/L (ref 0–100)

## 2011-07-15 LAB — BASIC METABOLIC PANEL
BUN: 18 mg/dL (ref 6–23)
Calcium: 9.6 mg/dL (ref 8.4–10.5)
Calcium: 10 mg/dL (ref 8.4–10.5)
Chloride: 106 mEq/L (ref 96–112)
Creatinine, Ser: 0.8 mg/dL (ref 0.50–1.35)
Creatinine, Ser: 0.77 mg/dL (ref 0.50–1.35)
GFR calc Af Amer: >60 mL/min (ref >60)
GFR calc Af Amer: >60 mL/min (ref >60)
GFR calc non Af Amer: >60 mL/min (ref >60)
GFR calc non Af Amer: >60 mL/min (ref >60)
Glucose, Bld: 114 mg/dL — ABNORMAL HIGH (ref 70–99)
Glucose, Bld: 78 mg/dL (ref 70–99)
Potassium: 4.2 mEq/L (ref 3.5–5.1)
Potassium: 4.5 mEq/L (ref 3.5–5.1)
Sodium: 140 mEq/L (ref 135–145)
Sodium: 140 mEq/L (ref 135–145)

## 2011-07-15 LAB — CBC
HCT: 41.8 % (ref 39.0–52.0)
Hemoglobin: 13.1 g/dL (ref 13.0–17.0)
MCHC: 31.3 g/dL (ref 30.0–36.0)
MCV: 77.8 fL — ABNORMAL LOW (ref 78.0–100.0)
WBC: 8.8 10*3/uL (ref 4.0–10.5)

## 2011-07-15 LAB — CARDIAC PANEL(CRET KIN+CKTOT+MB+TROPI)
CK, MB: 3.3 ng/mL (ref 0.3–4.0)
CK, MB: 7.2 ng/mL (ref 0.3–4.0)
Relative Index: INVALID (ref 0.0–2.5)
Total CK: 23 U/L (ref 7–232)
Total CK: 30 U/L (ref 7–232)
Total CK: 42 U/L (ref 7–232)
Troponin I: <0.3 ng/mL (ref <0.30)
Troponin I: 1.5 ng/mL (ref <0.30)

## 2011-07-15 LAB — PROTIME-INR: INR: 1.04 (ref 0.00–1.49)

## 2011-07-15 LAB — PRO B NATRIURETIC PEPTIDE: Pro B Natriuretic peptide (BNP): 7289 pg/mL — ABNORMAL HIGH (ref 0–125)

## 2011-07-15 LAB — LIPID PANEL
Cholesterol: 152 mg/dL (ref 0–200)
HDL: 39 mg/dL — ABNORMAL LOW (ref >39)
LDL Cholesterol: 99 mg/dL (ref 0–99)
Triglycerides: 69 mg/dL (ref <150)
VLDL: 14 mg/dL (ref 0–40)

## 2011-07-15 LAB — DIFFERENTIAL
Eosinophils Absolute: 0.3 10*3/uL (ref 0.0–0.7)
Lymphs Abs: 1.4 10*3/uL (ref 0.7–4.0)
Monocytes Relative: 9 % (ref 3–12)
Neutrophils Relative %: 72 % (ref 43–77)

## 2011-07-15 LAB — HEMOGLOBIN A1C
Hgb A1c MFr Bld: 5.8 % — ABNORMAL HIGH (ref <5.7)
Mean Plasma Glucose: 120 mg/dL — ABNORMAL HIGH (ref <117)

## 2011-07-15 LAB — MRSA PCR SCREENING: MRSA by PCR: NEGATIVE

## 2011-07-15 LAB — APTT: aPTT: 28 seconds (ref 24–37)

## 2011-07-16 DIAGNOSIS — I2109 ST elevation (STEMI) myocardial infarction involving other coronary artery of anterior wall: Secondary | ICD-10-CM

## 2011-07-16 LAB — CBC
Hemoglobin: 13 g/dL (ref 13.0–17.0)
MCH: 24.5 pg — ABNORMAL LOW (ref 26.0–34.0)
MCHC: 31.8 g/dL (ref 30.0–36.0)
MCV: 77 fL — ABNORMAL LOW (ref 78.0–100.0)
Platelets: 239 10*3/uL (ref 150–400)
RDW: 15.6 % — ABNORMAL HIGH (ref 11.5–15.5)

## 2011-07-16 LAB — BASIC METABOLIC PANEL
Calcium: 9.8 mg/dL (ref 8.4–10.5)
GFR calc Af Amer: >60 mL/min (ref >60)
GFR calc non Af Amer: >60 mL/min (ref >60)
Glucose, Bld: 99 mg/dL (ref 70–99)
Sodium: 140 mEq/L (ref 135–145)

## 2011-07-17 ENCOUNTER — Inpatient Hospital Stay (HOSPITAL_COMMUNITY): Payer: Medicare Other

## 2011-07-17 LAB — CBC
MCH: 24.9 pg — ABNORMAL LOW (ref 26.0–34.0)
Platelets: 207 10*3/uL (ref 150–400)
RBC: 5.14 MIL/uL (ref 4.22–5.81)

## 2011-07-17 LAB — BASIC METABOLIC PANEL
CO2: 26 mEq/L (ref 19–32)
Calcium: 9.5 mg/dL (ref 8.4–10.5)
GFR calc non Af Amer: >60 mL/min (ref >60)
Potassium: 3.7 mEq/L (ref 3.5–5.1)
Sodium: 138 mEq/L (ref 135–145)

## 2011-07-18 LAB — CBC
MCV: 78.1 fL (ref 78.0–100.0)
Platelets: 206 10*3/uL (ref 150–400)
RBC: 5.08 MIL/uL (ref 4.22–5.81)
WBC: 7.5 10*3/uL (ref 4.0–10.5)

## 2011-07-18 LAB — BASIC METABOLIC PANEL
CO2: 31 mEq/L (ref 19–32)
Chloride: 103 mEq/L (ref 96–112)
Sodium: 137 mEq/L (ref 135–145)

## 2011-07-19 LAB — PRO B NATRIURETIC PEPTIDE: Pro B Natriuretic peptide (BNP): 2531 pg/mL — ABNORMAL HIGH (ref 0–125)

## 2011-07-19 LAB — BASIC METABOLIC PANEL
CO2: 29 mEq/L (ref 19–32)
Chloride: 102 mEq/L (ref 96–112)
Sodium: 135 mEq/L (ref 135–145)

## 2011-07-22 NOTE — Cardiovascular Report (Signed)
NAME:  Jesus Sanders, Jesus Sanders NO.:  [id number]  MEDICAL RECORD NO.:  [id number]  LOCATION:  2906                         FACILITY:  [facility]  PHYSICIAN:  Quantrell Splitt M. [place], M.D.  DATE OF BIRTH:  1935-12-04  DATE OF PROCEDURE: DATE OF DISCHARGE:                           CARDIAC CATHETERIZATION   INDICATIONS FOR PROCEDURE:  A 75 year old white male who presents with acute onset of shortness of breath 2 hours prior to admission.  On arrival, he was found to be in acute pulmonary edema.  ECG showed new ST elevation in the anterior precordial leads with Q waves.  This was consistent with anterior ST elevation myocardial infarction of uncertain duration.  PROCEDURES:  Left heart catheterization, coronary and left ventricular angiography, and intracoronary stenting of the mid LAD accessed via the right femoral artery using the standard Seldinger technique.  EQUIPMENT:  A 6-French 4 cm right Judkins catheter, 6-French 4 cm left Judkins catheter, 6-French pigtail catheter, 6-French XB LAD guide, an intubation wire, 2.5 x 15-mm emerge balloon, a 2.5 x 23-mm Mini-Vision stent, a 3.0 x 12-mm Polk Quantum apex balloon, Angio-Seal closure device.  MEDICATIONS: 1. Local anesthesia with 1% Xylocaine. 2. Nitroglycerin 200 mcg intracoronary x4. 3. Angiomax bolus of 0.75 mg/kg followed by continuous infusion of     1.75 mg/kg per hour. 4. Subsequent ACT was 338. 5. Plavix 600 mg p.o. 6. Lasix 40 mg IV. 7. IV nitroglycerin 10 mcg per minute. 8. Contrast 250 mL of Omnipaque.  HEMODYNAMIC DATA:  Aortic pressure is 124/81 with a mean of 99 mmHg. Left ventricle pressure is 119 with an EDP of 19 mmHg.  ANGIOGRAPHIC DATA:  The right coronary artery arises and distributes normally.  It is a very large dominant vessel and is normal.  There are no collaterals to the LAD.  The left coronary artery arises and distributes normally.  Left main coronary artery is normal.  The left anterior  descending artery is occluded in the proximal to mid vessel.  The left circumflex coronary had a 40% lesion proximally.  At this point, we proceeded with emergent percutaneous intervention of the LAD.  We initially started with a JL-4 guide and a Prowater wire. We were unable to cross the lesion.  We switched to XB LAD guide and intubation wire and with great difficulty, we were able to finally cross the lesion.  We then predilated the lesion using the 2.5 x 15-mm emerge balloon up to 6, then 8, and then 10 atmospheres.  With reperfusion, it was noted that the mid-to-distal LAD was diffusely small in caliber and diseased with poor distal flow.  We did stent the site of the occlusion with a 2.5 x 23-mm Mini-Vision stent deploying this at 12 atmospheres. We postdilated the proximal midportion of the stent with a 3.0-mm noncompliant balloon up to 14 atmospheres in the mid stent and 16 atmospheres proximally.  This yielded an excellent stent result with 0% residual stenosis.  There was still, distal to the stent, diffusely small LAD which appeared diffusely diseased with poor flow to the apex. At best, there was only TIMI-2 flow.  The left ventricular angiography was performed in the RAO view.  This  demonstrated mild left ventricular enlargement with a large area of anterior apical dyskinesis.  Overall ejection fraction was estimated at 30%.  The right femoral artery was closed with an Angio-Seal device with excellent hemostasis.  FINAL INTERPRETATION: 1. Single-vessel occlusive atherosclerotic coronary artery disease. 2. Severe left ventricular dysfunction with anterior apical     dyskinesis. 3. Successful stenting of the mid-LAD with a bare-metal stent, but was     still poor distal flow.  PLAN:  I suspect this patient's infarct is old, and he has already had substantial myocardial damage.  He will be transferred to the intensive care unit where he will continue with therapy with  IV diuresis and IV nitroglycerin.  He will be maintained on aspirin and Plavix.  We will continue Angiomax at reduced dose for 2 hours and then he may need to be considered for anticoagulation with heparin or Lovenox given his large anterior apical infarct.          ______________________________ Blondine Hottel M. [place], M.D.     PMJ/MEDQ  D:  07/15/2011  T:  07/15/2011  Job:  [id number]  cc:   Willis Modena, MD  Electronically Signed by Allona Gondek [place] M.D. on 07/22/2011 05:12:41 PM

## 2011-07-25 ENCOUNTER — Encounter (HOSPITAL_BASED_OUTPATIENT_CLINIC_OR_DEPARTMENT_OTHER): Payer: Medicare Other | Admitting: Oncology

## 2011-07-25 ENCOUNTER — Other Ambulatory Visit: Payer: Self-pay | Admitting: Oncology

## 2011-07-25 DIAGNOSIS — D45 Polycythemia vera: Secondary | ICD-10-CM

## 2011-07-25 LAB — CBC WITH DIFFERENTIAL/PLATELET
BASO%: 0.4 % (ref 0.0–2.0)
Basophils Absolute: 0 10*3/uL (ref 0.0–0.1)
EOS%: 3.9 % (ref 0.0–7.0)
HCT: 44.6 % (ref 38.4–49.9)
HGB: 14.1 g/dL (ref 13.0–17.1)
LYMPH%: 22.1 % (ref 14.0–49.0)
MCH: 24.8 pg — ABNORMAL LOW (ref 27.2–33.4)
MCHC: 31.6 g/dL — ABNORMAL LOW (ref 32.0–36.0)
NEUT%: 65.9 % (ref 39.0–75.0)
Platelets: 208 10*3/uL (ref 140–400)
lymph#: 2 10*3/uL (ref 0.9–3.3)

## 2011-07-26 ENCOUNTER — Encounter: Payer: Self-pay | Admitting: General Surgery

## 2011-07-28 NOTE — Discharge Summary (Signed)
NAME:  Jesus Sanders, FAGIN NO.:  [id number]  MEDICAL RECORD NO.:  [id number]  LOCATION:  3738                         FACILITY:  [facility]  PHYSICIAN:  Peter M. [place], M.D.  DATE OF BIRTH:  Dec 03, 1935  DATE OF ADMISSION:  07/15/2011 DATE OF DISCHARGE:  07/19/2011                              DISCHARGE SUMMARY   PRIMARY CARDIOLOGIST:  Peter M. [place], M.D.  DISCHARGE DIAGNOSIS:  Acute anterior ST-segment elevation myocardial infarction.  SECONDARY DIAGNOSES: 1. Coronary artery disease, status post successful percutaneous     coronary intervention with bare-metal stenting of an occluded left     anterior descending artery on this admission. 2. Acute systolic congestive heart failure, requiring diuresis. 3. Ischemic cardiomyopathy with an EF of 25-30% by echocardiogram on     this admission. 4. Hypokalemia, requiring supplementation. 5. Hyperlipidemia. 6. Tobacco abuse. 7. Polycythemia vera. 8. Status post hernia repair. 9. History of cholelithiasis and cholecystitis, status post open     cholecystectomy. 10.Duodenal perforation, status post repair. 11.Peritonitis and sepsis in June 2012, requiring exploratory lap.  ALLERGIES:  No known drug allergies.  PROCEDURES:  Emergent left heart cardiac catheterization performed on July 15, 2011, revealing left main:  Normal.  LAD 1% occluded proximal and mid.  Left circumflex 4% proximal.  RCA large dominant vessel, which was normal.  EF was 30% with a large area of anterior apical dyskinesis.  The LAD was successfully stented with placement of 2.5 x 23-mm MultiLink Vision bare-metal stent.  PROCEDURE:  A 2-D echocardiogram on July 15, 2011, revealing an EF of 25-30% with akinesis of the mid-to-distal anterior and apical myocardium.  Trivial aortic insufficiency.  No evidence of apical thrombus.  HISTORY OF PRESENT ILLNESS:  This is a 75 year old male without prior cardiac history, who was in his usual state  of health till approximately 2 hours prior to admission when he awoke at 2:00 a.m. with severe shortness of breath which did not immediately improve.  Subsequently called EMS where he was found to be in sinus tachycardia with anterior segment elevation in leads V3-V5.  Code STEMI was called.  The patient was taken to the [facility] ED where he was treated with aspirin and heparin bolus and subsequently taken to the Cardiac Cath Lab for emergent diagnostic catheterization.  HOSPITAL COURSE:  The patient underwent diagnostic catheterization on September 7, revealing an occlusion of the proximal mid LAD and otherwise, nonobstructive disease.  EF was 30% with large area of anterior apical dyskinesis.  The LAD was felt to be infarct vessel. This was subsequently stented with a 2.5 23-mm Multilink Vision bare- metal stent.  The patient was noted to have pulmonary edema on chest x- ray that was performed in the ED and also had elevated LVEDP of 19 mmHg. He was transferred to the Coronary Intensive Care Unit following percutaneous intervention and was placed on IV Lasix.  The patient eventually peaked his CK at 42, MB 7.2, troponin-I at 1.50. On admission, Pro-BNP was elevated at 7289.  With diuresis, this reduced.  The patient had clinical improvement and Lasix dose was eventually converted to p.o.  Maintained on aspirin, Plavix, beta- blocker, ACE inhibitor therapy.  He did have mild hypotension on September 9 resulting in holding both his Lasix and ACE inhibitor; however, this has subsequently been resumed.  In the setting of a large anterior infarct with large area of anterior apical dyskinesis, there was concern for possibility of apical thrombus. A 2-D echocardiogram was carried out on September 7 showing an EF of 25- 30% with akinesis of the mid-to-distal anterior and apical myocardium; however, there was no evidence of apical thrombus.  The patient was seen by both Cardiac Rehab and  Tobacco Cessation and counseling was provided.  He subsequently transferred out to the floor on September 10.  He has been ambulating without recurrent symptoms or limitations.  The patient will be discharged home today in good condition.  We will plan to continue Plavix for 30 days and discontinue at that point, given his recent history of peptic ulcer disease with duodenal perforation in June of this year.  DISCHARGE LABS:  Hemoglobin 12.3, hematocrit 39.7, WBC 7.5, platelets 206.  INR 1.04.  Sodium 135, potassium 4.3, chloride 102, CO2 of 29, BUN 25, creatinine 0.91, glucose 96, calcium 9.5, hemoglobin A1c 5.8, CK 42, MB 7.2, troponin-I 1.50, pro-BNP 2005 and 31.  Total cholesterol 152, triglycerides 69, HDL 39, LDL 99.  MRSA screen was negative.  DISPOSITION:  The patient will be discharged home today in good condition.  FOLLOWUP APPOINTMENTS:  We have arranged for follow-up with Norma Fredrickson, nurse practitioner, in our [facility] Cardiology Office on September 25 at 9:30 a.m.  DISCHARGE MEDICATIONS: 1. Carvedilol 3.125 mg b.i.d. 2. Plavix 75 daily for 30 days. 3. Lasix 20 daily. 4. Lipitor 80 q.h.s. 5. Lisinopril 2.5 mg daily. 6. Nitroglycerin 2.5 sublingual, chest pain. 7. Protonix 4 mg daily. 8. Potassium chloride 10 mEq daily. 9. Aspirin 81 mg daily. 10.Carafate 2 teaspoons q.i.d.  OUTSTANDING LABORATORY DATA AND STUDIES:  The patient should have follow up basic metabolic panel when seen in September 25.  He will require follow-up lipids and LFTs in approximately 6-8 weeks.  He will require a repeat 2-D echocardiogram in approximately 12 weeks to follow up LV function and to determine candidacy for AICD.  We will also consider initiation of spirolactone in the outpatient setting.  DURATION OF DISCHARGE ENCOUNTER:  60 minutes including physician time.     Nicolasa Ducking, ANP   ______________________________ Peter M. [place], M.D.    CB/MEDQ  D:   07/19/2011  T:  07/19/2011  Job:  [id number]  Electronically Signed by Nicolasa Ducking ANP on 07/28/2011 03:49:52 PM Electronically Signed by PETER [place] M.D. on 07/28/2011 05:08:26 PM

## 2011-08-02 ENCOUNTER — Encounter: Payer: Self-pay | Admitting: Nurse Practitioner

## 2011-08-02 ENCOUNTER — Ambulatory Visit (INDEPENDENT_AMBULATORY_CARE_PROVIDER_SITE_OTHER): Payer: Medicare Other | Admitting: Nurse Practitioner

## 2011-08-02 VITALS — BP 168/108 | HR 84 | Ht 72.0 in | Wt 196.0 lb

## 2011-08-02 DIAGNOSIS — I5020 Unspecified systolic (congestive) heart failure: Secondary | ICD-10-CM

## 2011-08-02 DIAGNOSIS — I219 Acute myocardial infarction, unspecified: Secondary | ICD-10-CM

## 2011-08-02 DIAGNOSIS — I2510 Atherosclerotic heart disease of native coronary artery without angina pectoris: Secondary | ICD-10-CM | POA: Insufficient documentation

## 2011-08-02 DIAGNOSIS — I213 ST elevation (STEMI) myocardial infarction of unspecified site: Secondary | ICD-10-CM

## 2011-08-02 DIAGNOSIS — I5022 Chronic systolic (congestive) heart failure: Secondary | ICD-10-CM | POA: Insufficient documentation

## 2011-08-02 LAB — CBC WITH DIFFERENTIAL/PLATELET
Basophils Absolute: 0 10*3/uL (ref 0.0–0.1)
Basophils Relative: 0.4 % (ref 0.0–3.0)
Eosinophils Absolute: 0.3 10*3/uL (ref 0.0–0.7)
Eosinophils Relative: 3.6 % (ref 0.0–5.0)
HCT: 44.6 % (ref 39.0–52.0)
Hemoglobin: 14.1 g/dL (ref 13.0–17.0)
Lymphocytes Relative: 15.9 % (ref 12.0–46.0)
Lymphs Abs: 1.3 10*3/uL (ref 0.7–4.0)
MCHC: 31.6 g/dL (ref 30.0–36.0)
MCV: 78.8 fl (ref 78.0–100.0)
Monocytes Absolute: 0.7 10*3/uL (ref 0.1–1.0)
Monocytes Relative: 7.8 % (ref 3.0–12.0)
Neutro Abs: 6 10*3/uL (ref 1.4–7.7)
Neutrophils Relative %: 72.3 % (ref 43.0–77.0)
Platelets: 225 10*3/uL (ref 150.0–400.0)
RBC: 5.65 Mil/uL (ref 4.22–5.81)
RDW: 18 % — ABNORMAL HIGH (ref 11.5–14.6)
WBC: 8.3 10*3/uL (ref 4.5–10.5)

## 2011-08-02 LAB — BASIC METABOLIC PANEL
BUN: 19 mg/dL (ref 6–23)
CO2: 29 mEq/L (ref 19–32)
Calcium: 10 mg/dL (ref 8.4–10.5)
Chloride: 107 mEq/L (ref 96–112)
Creatinine, Ser: 1 mg/dL (ref 0.4–1.5)
GFR: 75.22 mL/min (ref >60.00)
Glucose, Bld: 103 mg/dL — ABNORMAL HIGH (ref 70–99)
Potassium: 5.9 mEq/L — ABNORMAL HIGH (ref 3.5–5.1)
Sodium: 144 mEq/L (ref 135–145)

## 2011-08-02 MED ORDER — LISINOPRIL 5 MG PO TABS: 5.0000 mg | ORAL_TABLET | Freq: Every day | ORAL | Status: DC

## 2011-08-02 MED ORDER — CARVEDILOL 6.25 MG PO TABS: 6.2500 mg | ORAL_TABLET | Freq: Two times a day (BID) | ORAL | Status: DC

## 2011-08-02 NOTE — Patient Instructions (Addendum)
You have a moderate degree of weakness of your heart muscle. Your pumping function is 25%. Normal is 55 to 60%. We need to keep increasing your medicines to help this get better.  We will repeat your ultrasound in a few months. Minimize your salt You may continue with your walking.  We are going to increase your Lisinopril to 5 mg daily and increase the Carvedilol to 6.25 mg two times a day. I have sent new prescriptions to the drug store.  We are going to check some labs today I will see you in 2 weeks.

## 2011-08-02 NOTE — Assessment & Plan Note (Signed)
No symptoms. He is on Plavix for just 30 days because of his GI issues and recent bleeding. He is to continue with his walking. Medicines are adjusted.

## 2011-08-02 NOTE — Progress Notes (Signed)
[name] Date of Birth: 08/22/1936   History of Present Illness: [name] is seen back today for a post hospital visit. He is seen for Dr. [place]. He has had a recent large anterior apical MI with BMS to the LAD. His EF was 25%. It was felt that he presented late. He did not have chest pain but had acute onset of shortness of breath. He is on low dose ACE and beta blocker. He will need up titration of his medicines and maybe add Aldactone in the future. He will need a repeat echo in a couple of months. He is only on Plavix for 30 days due to recent history of bleeding and GI issues. He is doing well. No real complaints. No dyspnea, edema, PND, orthopnea or cough. No chest pain. Groin has done fine. His grand daughter [name] fixes his medicines and he just takes them. He tries to watch his salt. He is walking twice a day without problems.   Current Outpatient Prescriptions on File Prior to Visit  Medication Sig Dispense Refill  . aspirin 81 MG tablet Take 81 mg by mouth daily.        Marland Kitchen atorvastatin (LIPITOR) 80 MG tablet Take 80 mg by mouth daily.        . clopidogrel (PLAVIX) 75 MG tablet Take 75 mg by mouth daily.        . furosemide (LASIX) 20 MG tablet Take 20 mg by mouth daily.        . pantoprazole (PROTONIX) 40 MG tablet Take 40 mg by mouth daily.        . potassium chloride (K-DUR) 10 MEQ tablet Take 10 mEq by mouth daily.        . sucralfate (CARAFATE) 1 G tablet Take 1 g by mouth 4 (four) times daily.          No Known Allergies  Past Medical History  Diagnosis Date  . Polycythemia vera     [facility]  will restart his phlebotomies from about Mar 15 2011  . Stomach ulcer   . STEMI (ST elevation myocardial infarction) Sept 2012    Larger anterior apical MI with BMS to LAD with EF of 25 to 30%  . Tobacco abuse     stopped in April of 2012  . Duodenal perforation June 2012  . Peritonitis June 2012  . S/P  cardiac cath Sept 2012    BMS to LAD  . Pneumonia April 2012    Past Surgical History  Procedure Date  . Cholecystectomy 03/2011    Dr. Carolynne Edouard  . Colon surgery   . Cardiac catheterization Sept 2012    Normal left main, occluded LAD, 40% LCX and normal RCA. EF is 30%  . US echocardiography Sept 2012    EF 25 to 30% with akinesis of the mid to distal anterior and apical myocardium, trivial AI and no apical thrombus    History  Smoking status  . Former Smoker  . Types: Cigarettes  . Quit date: 03/04/2011  Smokeless tobacco  . Not on file  Comment: Quit smoking in April 2012. About 60-pack-year history of smoking    History  Alcohol Use No    socially    Family History  Problem Relation Age of Onset  . Lung disease Father     Review of Systems: The review of systems is positive for ecchymosis. No active bleeding.  All  other systems were reviewed and are negative.  Physical Exam: BP 168/108  Pulse 84  Ht 6' (1.829 m)  Wt 196 lb (88.905 kg)  BMI 26.58 kg/m2 Patient is very pleasant and in no acute distress. Skin is warm and dry. Arms are ecchymotic. Color is normal.  HEENT is unremarkable. Normocephalic/atraumatic. PERRL. Sclera are nonicteric. Neck is supple. No masses. No JVD. Lungs are clear. Cardiac exam shows a regular rate and rhythm. S3 noted. Abdomen is soft. Extremities are without edema. Gait and ROM are intact. No gross neurologic deficits noted.   LABORATORY DATA: PENDING   Assessment / Plan:

## 2011-08-02 NOTE — Assessment & Plan Note (Addendum)
He has an EF of 25%. We have plenty of blood pressure and heart rate to work with. I have increased his Lisinopril to 5 mg daily and the Coreg to 6.25 mg BID. Hope to add aldactone in the future.  I will see him back in 2 weeks. BMET and CBC are checked today.  I explained that we need to up titrate his medicines and recheck an ultrasound in a few months. May have to consider ICD after optimal therapy.

## 2011-08-03 ENCOUNTER — Telehealth: Payer: Self-pay | Admitting: *Deleted

## 2011-08-03 DIAGNOSIS — I2510 Atherosclerotic heart disease of native coronary artery without angina pectoris: Secondary | ICD-10-CM

## 2011-08-03 NOTE — Telephone Encounter (Signed)
Notified of lab results. Will send to Dr. Patel. 

## 2011-08-03 NOTE — Telephone Encounter (Signed)
Message copied by Lorayne Bender on Wed Aug 03, 2011  2:43 PM ------      Message from: Rosalio Macadamia      Created: Tue Aug 02, 2011  4:26 PM       Please report. Needs to stop potassium. Recheck BMET on return visit.

## 2011-08-15 ENCOUNTER — Encounter (HOSPITAL_BASED_OUTPATIENT_CLINIC_OR_DEPARTMENT_OTHER): Payer: Medicare Other | Admitting: Oncology

## 2011-08-15 ENCOUNTER — Other Ambulatory Visit: Payer: Self-pay | Admitting: Oncology

## 2011-08-15 DIAGNOSIS — D45 Polycythemia vera: Secondary | ICD-10-CM

## 2011-08-15 LAB — CBC WITH DIFFERENTIAL/PLATELET
BASO%: 0.3 % (ref 0.0–2.0)
Eosinophils Absolute: 0.3 10*3/uL (ref 0.0–0.5)
LYMPH%: 24.3 % (ref 14.0–49.0)
MCHC: 31.1 g/dL — ABNORMAL LOW (ref 32.0–36.0)
MONO#: 0.6 10*3/uL (ref 0.1–0.9)
NEUT#: 4.6 10*3/uL (ref 1.5–6.5)
RBC: 5.6 10*6/uL (ref 4.20–5.82)
RDW: 17.5 % — ABNORMAL HIGH (ref 11.0–14.6)
WBC: 7.3 10*3/uL (ref 4.0–10.3)
lymph#: 1.8 10*3/uL (ref 0.9–3.3)
nRBC: 0 % (ref 0–0)

## 2011-08-16 ENCOUNTER — Encounter: Payer: Self-pay | Admitting: Nurse Practitioner

## 2011-08-16 ENCOUNTER — Ambulatory Visit (INDEPENDENT_AMBULATORY_CARE_PROVIDER_SITE_OTHER): Payer: Medicare Other | Admitting: Nurse Practitioner

## 2011-08-16 DIAGNOSIS — I2510 Atherosclerotic heart disease of native coronary artery without angina pectoris: Secondary | ICD-10-CM

## 2011-08-16 DIAGNOSIS — R58 Hemorrhage, not elsewhere classified: Secondary | ICD-10-CM

## 2011-08-16 DIAGNOSIS — T148XXA Other injury of unspecified body region, initial encounter: Secondary | ICD-10-CM

## 2011-08-16 DIAGNOSIS — I5020 Unspecified systolic (congestive) heart failure: Secondary | ICD-10-CM

## 2011-08-16 DIAGNOSIS — I998 Other disorder of circulatory system: Secondary | ICD-10-CM

## 2011-08-16 LAB — CBC WITH DIFFERENTIAL/PLATELET
Basophils Absolute: 0 10*3/uL (ref 0.0–0.1)
Basophils Relative: 0.4 % (ref 0.0–3.0)
Eosinophils Absolute: 0.4 10*3/uL (ref 0.0–0.7)
Eosinophils Relative: 5.1 % — ABNORMAL HIGH (ref 0.0–5.0)
HCT: 44.1 % (ref 39.0–52.0)
Hemoglobin: 13.8 g/dL (ref 13.0–17.0)
Lymphocytes Relative: 18.4 % (ref 12.0–46.0)
Lymphs Abs: 1.4 10*3/uL (ref 0.7–4.0)
MCHC: 31.3 g/dL (ref 30.0–36.0)
MCV: 79.1 fl (ref 78.0–100.0)
Monocytes Absolute: 0.6 10*3/uL (ref 0.1–1.0)
Monocytes Relative: 7.5 % (ref 3.0–12.0)
Neutro Abs: 5.2 10*3/uL (ref 1.4–7.7)
Neutrophils Relative %: 68.6 % (ref 43.0–77.0)
Platelets: 166 10*3/uL (ref 150.0–400.0)
RBC: 5.57 Mil/uL (ref 4.22–5.81)
RDW: 19.6 % — ABNORMAL HIGH (ref 11.5–14.6)
WBC: 7.5 10*3/uL (ref 4.5–10.5)

## 2011-08-16 LAB — BASIC METABOLIC PANEL
BUN: 17 mg/dL (ref 6–23)
CO2: 28 mEq/L (ref 19–32)
Calcium: 9.4 mg/dL (ref 8.4–10.5)
Chloride: 109 mEq/L (ref 96–112)
Creatinine, Ser: 1 mg/dL (ref 0.4–1.5)
GFR: 81.58 mL/min (ref >60.00)
Glucose, Bld: 94 mg/dL (ref 70–99)
Potassium: 4.5 mEq/L (ref 3.5–5.1)
Sodium: 143 mEq/L (ref 135–145)

## 2011-08-16 MED ORDER — LISINOPRIL 5 MG PO TABS: 5.0000 mg | ORAL_TABLET | Freq: Two times a day (BID) | ORAL | Status: DC

## 2011-08-16 MED ORDER — CARVEDILOL 12.5 MG PO TABS: 12.5000 mg | ORAL_TABLET | Freq: Two times a day (BID) | ORAL | Status: DC

## 2011-08-16 NOTE — Progress Notes (Signed)
[name] Date of Birth: [birth year]   History of Present Illness: Nox is seen back today for a 2 week check. He is seen for Dr. [place]. He had a large anterior apical MI with BMS to the LAD on September 7th. His EF is 25%.  We are titrating medicines up for his LV dysfunction. He continues to do well. He is not having chest pain. He is not short of breath. Weights are stable. His grand daughter [name] manages his pills. She works at [employer]. Overall, he is doing well. Walking 2 times a day. His only complaint is of significant bruising. Plavix was just for 30 days due to GI history. No bleeding reported. He does have a history of polycythemia vera and previously had phlebotomies.  Current Outpatient Prescriptions on File Prior to Visit  Medication Sig Dispense Refill  . aspirin 81 MG tablet Take 81 mg by mouth daily.        Marland Kitchen atorvastatin (LIPITOR) 80 MG tablet Take 80 mg by mouth daily.        . furosemide (LASIX) 20 MG tablet Take 20 mg by mouth daily.        . pantoprazole (PROTONIX) 40 MG tablet Take 40 mg by mouth daily.        . sucralfate (CARAFATE) 1 G tablet Take 1 g by mouth 4 (four) times daily.          No Known Allergies  Past Medical History  Diagnosis Date  . Polycythemia vera     [facility]  will restart his phlebotomies from about Mar 15 2011  . Stomach ulcer   . STEMI (ST elevation myocardial infarction) Sept 2012    Larger anterior apical MI with BMS to LAD with EF of 25 to 30%  . Tobacco abuse     stopped in April of 2012  . Duodenal perforation June 2012  . Peritonitis June 2012  . S/P cardiac cath Sept 2012    BMS to LAD  . Pneumonia April 2012    Past Surgical History  Procedure Date  . Cholecystectomy 03/2011    Dr. Carolynne Edouard  . Colon surgery   . Cardiac catheterization Sept 2012    Normal left main, occluded LAD, 40% LCX and normal RCA. EF is 30%  . US echocardiography Sept 2012   EF 25 to 30% with akinesis of the mid to distal anterior and apical myocardium, trivial AI and no apical thrombus    History  Smoking status  . Former Smoker  . Types: Cigarettes  . Quit date: 03/04/2011  Smokeless tobacco  . Not on file  Comment: Quit smoking in April 2012. About 60-pack-year history of smoking    History  Alcohol Use No    socially    Family History  Problem Relation Age of Onset  . Lung disease Father     Review of Systems: The review of systems is positive for excessive ecchymosis.  All other systems were reviewed and are negative.  Physical Exam: BP 162/90  Pulse 80  Ht 6' (1.829 m)  Wt 199 lb 12.8 oz (90.629 kg)  BMI 27.10 kg/m2 Patient is very pleasant and in no acute distress. Skin is warm and dry. Color is normal. His arms are quite ecchymotic.  HEENT is unremarkable. Normocephalic/atraumatic. PERRL. Sclera are nonicteric. Neck is supple. No masses. No JVD. Lungs are clear. Cardiac exam shows a regular rate and rhythm.  Soft S3 noted. Abdomen is soft. Extremities are without edema. Gait and ROM are intact. No gross neurologic deficits noted.   LABORATORY DATA: CBC is pending.    Assessment / Plan:

## 2011-08-16 NOTE — Assessment & Plan Note (Signed)
He is doing well. No symptoms. Walking 2 times a day. Will continue with titration of medicines. I will see him again in 3 weeks. Patient is agreeable to this plan and will call if any problems develop in the interim.

## 2011-08-16 NOTE — Assessment & Plan Note (Signed)
Plavix is stopped. He has completed his 30 days of therapy. He is having significant bruising. We will check a CBC today as well.

## 2011-08-16 NOTE — Patient Instructions (Signed)
We are going to increase the Coreg to 12.5 mg two times a day. He may take 2 of the 6.25 mg tablets two times a day and use those up. We are going to increase the Lisinopril to 5 mg two times a day. I have sent both prescriptions to the drug store. I think you are doing well. Continue to monitor your weight. Stay away from the salt.  I want to see you in 3 weeks.  Call for any problems.

## 2011-08-16 NOTE — Assessment & Plan Note (Signed)
Has EF of 25%. I have increased the Lisinopril to 5 mg BID and increased his Coreg to 12. 5 mg BID. I will see him back in 3 weeks. He is to continue to monitor his weights and avoid salt. He will need an echo in a few months to reassess LV function.

## 2011-08-17 ENCOUNTER — Telehealth: Payer: Self-pay | Admitting: *Deleted

## 2011-08-17 NOTE — Telephone Encounter (Signed)
Message copied by Lorayne Bender on Wed Aug 17, 2011  5:53 PM ------      Message from: Rosalio Macadamia      Created: Tue Aug 16, 2011 11:52 AM       Ok to report. Labs are satisfactory. Platelets were ok.

## 2011-08-17 NOTE — Telephone Encounter (Signed)
Notified of lab results. 

## 2011-09-01 NOTE — H&P (Signed)
NAME:  Jesus Sanders, Jesus Sanders NO.:  [id number]  MEDICAL RECORD NO.:  [id number]  LOCATION:  [facility]                         FACILITY:  [facility]  PHYSICIAN:  Therisa Doyne, MD    DATE OF BIRTH:  09/21/1936  DATE OF ADMISSION:  07/15/2011 DATE OF DISCHARGE:                             HISTORY & PHYSICAL   CHIEF COMPLAINT:  Shortness of breath, the patient presents as EMS transfer for acute anterior ST-segment elevation myocardial infarction.  HISTORY OF PRESENT ILLNESS:  A 75 year old white male with past medical history significant for tobacco abuse who presents as a code STEMI with anterior ST-segment elevation.  The patient was in his usual state of health when he went to bed last night and awoke at approximately 2 a.m. with severe shortness of breath.  He laid in bed for approximately an half and half and then called the EMS.  EMS was activated and initial EKG demonstrates sinus tachycardia with anterior ST-segment elevation in leads V3 through V5.  A code STEMI was initiated and the patient was brought to the emergency department then directly to the cardiac catheterization lab.  In the emergency department, he did receive aspirin and heparin bolus.  Upon arrival to the cath lab, he was still having significant shortness of breath.  Throughout this time, he denied any significant chest pain.  Cardiac catheterization demonstrated 100% mid LAD lesion.  This was treated with a 2.5 x 23 mm Multilink Vision stent.  Angiomax and Plavix bolus was used for procedural anticoagulation and antiplatelet therapy.  The procedure is currently ongoing.  So please see procedural note for remainder of procedural details.  Of note, the patient had a complicated GI history over the past several months.  He did undergo abdominal surgery for a duodenal perforation. This was repaired.  He also reports having an EGD performed in the past few weeks which showed "ulcers." He denies recent  heart failure symptoms, denied PND, orthopnea or lower extremity edema.  He denies any tachy palpitations or syncope.  PAST MEDICAL HISTORY: 1. Duodenal perforation in June 2012. 2. Recent EGD demonstrating gastric ulcers.  SOCIAL HISTORY:  The patient has a history of smoking but quit in April 2012.  He drinks brandy at night approximately 1 glass in the evening. He denies any drug use.  FAMILY HISTORY:  Negative for premature coronary artery disease.  MEDICATIONS:  Aspirin daily.  ALLERGIES:  No known drug allergies.  REVIEW OF SYSTEMS:  All systems are reviewed are negative except as mentioned above in history of present illness.  PHYSICAL EXAMINATION:  VITAL SIGNS:  Temperature afebrile, blood pressure is 120/80, heart rate is 90, respirations 18, oxygen saturation 100% on face mask. GENERAL:  Mild distress. HEENT:  Oropharynx pink and moist without lesions.  Normocephalic, atraumatic.  Pupils are equal, round and reactive. NECK:  Supple.  No lymphadenopathy.  No jugular venous distention. CARDIOVASCULAR:  Tachycardic, regular rhythm.  No murmurs. LUNGS:  Clear to auscultation bilaterally. ABDOMEN:  Positive bowel sounds, soft, nontender, nondistended. EXTREMITIES:  No clubbing, cyanosis or edema.  2+ femoral pulses bilaterally, 2+ dorsalis pedis bilaterally. NEUROLOGIC:  Grossly intact. PSYCH:  Normal affect.  PERTINENT DATA:  EKG from EMS demonstrates sinus tachycardia 119 beats per minute with anterior ST-segment elevation in leads V3-V5 consistent with acute anterior myocardial infarction.  PERTINENT LABORATORY DATA:  Initial labs demonstrate a potassium of 4.2, BUN 20, creatinine 0.9, hemoglobin 14.6, troponin 0.06.  IMPRESSION AND PLAN:  Jesus Sanders is a 75 year old male with a past medical history significant for tobacco use who presents as an acute anterior ST-segment elevation myocardial infarction.  He has received primary percutaneous coronary intervention  with implantation of a 2.5 x 23 mm bare-metal stent. 1. Admit the patient to [facility] under Dr. Elvis Coil care. 2. ST-segment elevation myocardial infarction.  He has undergone     successful revascularization with a bare-metal stent.  The     procedure is still ongoing, so please see procedural note for     details of his left ventriculogram and the final angiographic     results.  We will plan on monitoring him in the cardiac ICU.  He     will need to get dual antiplatelet therapy with aspirin and Plavix,     aspirin 325 mg daily and Plavix 75 mg daily.  We will also given     high-dose statin therapy with Lipitor 80 mg daily.  He is going to     get initiated on beta blocker and ACE inhibitors.  We will cycle     cardiac enzymes.  Check a risk factor profile including A1c and a     fasting lipid profile. 3. Fluids, electrolytes, and nutrition.  Normal saline at 75 mL/h.     Serum electrolytes are stable.  Low-salt, low-fat diet. 4. Deep vein thrombosis prophylaxis.  We will initiate subcutaneous     heparin for deep vein thrombosis prophylaxis after the sheath has     been removed and we had successful hemostasis.     Therisa Doyne, MD     SJT/MEDQ  D:  07/15/2011  T:  07/15/2011  Job:  [id number]  Electronically Signed by Aldona Bar MD on 09/01/2011 06:09:18 AM

## 2011-09-06 ENCOUNTER — Telehealth: Payer: Self-pay | Admitting: *Deleted

## 2011-09-06 ENCOUNTER — Ambulatory Visit (INDEPENDENT_AMBULATORY_CARE_PROVIDER_SITE_OTHER): Payer: Medicare Other | Admitting: Nurse Practitioner

## 2011-09-06 ENCOUNTER — Encounter (INDEPENDENT_AMBULATORY_CARE_PROVIDER_SITE_OTHER): Payer: Medicare Other | Admitting: General Surgery

## 2011-09-06 ENCOUNTER — Encounter: Payer: Self-pay | Admitting: Nurse Practitioner

## 2011-09-06 VITALS — BP 142/78 | HR 66 | Ht 72.0 in | Wt 201.4 lb

## 2011-09-06 DIAGNOSIS — I255 Ischemic cardiomyopathy: Secondary | ICD-10-CM

## 2011-09-06 DIAGNOSIS — I10 Essential (primary) hypertension: Secondary | ICD-10-CM

## 2011-09-06 DIAGNOSIS — R58 Hemorrhage, not elsewhere classified: Secondary | ICD-10-CM

## 2011-09-06 DIAGNOSIS — I2589 Other forms of chronic ischemic heart disease: Secondary | ICD-10-CM

## 2011-09-06 DIAGNOSIS — I2510 Atherosclerotic heart disease of native coronary artery without angina pectoris: Secondary | ICD-10-CM

## 2011-09-06 DIAGNOSIS — I998 Other disorder of circulatory system: Secondary | ICD-10-CM

## 2011-09-06 DIAGNOSIS — I5020 Unspecified systolic (congestive) heart failure: Secondary | ICD-10-CM

## 2011-09-06 LAB — BASIC METABOLIC PANEL
BUN: 18 mg/dL (ref 6–23)
CO2: 29 mEq/L (ref 19–32)
Calcium: 9.4 mg/dL (ref 8.4–10.5)
Chloride: 106 mEq/L (ref 96–112)
Creatinine, Ser: 1 mg/dL (ref 0.4–1.5)
GFR: 80.6 mL/min (ref >60.00)
Glucose, Bld: 94 mg/dL (ref 70–99)
Potassium: 4.2 mEq/L (ref 3.5–5.1)
Sodium: 141 mEq/L (ref 135–145)

## 2011-09-06 MED ORDER — FUROSEMIDE 20 MG PO TABS: 20.0000 mg | ORAL_TABLET | Freq: Every day | ORAL | Status: DC | PRN

## 2011-09-06 MED ORDER — LISINOPRIL 10 MG PO TABS: 10.0000 mg | ORAL_TABLET | Freq: Two times a day (BID) | ORAL | Status: DC

## 2011-09-06 NOTE — Assessment & Plan Note (Signed)
Doing well. No recurrent symptoms.

## 2011-09-06 NOTE — Progress Notes (Signed)
Jesus Sanders Date of Birth: 17-Jun-1936 Medical Record #[id number]  History of Present Illness: Jesus Sanders is seen back today for a 3 week check. He is seen for Dr. Swaziland. He had a large anterior apical MI with BMS to the LAD on September 7th. He was felt to have presented late. EF is 25%. We are uptitrating his medicines. Coreg was increased at his last visit. He feels good. No chest pain, shortness of breath, or dizziness. Does not like taking the diuretic. Says he is voiding too much. His weight has been stable. No swelling. His grad daughter Lyla Sanders manages his pills. He is tolerating his medicines but would like to change the Lasix to as needed.   Current Outpatient Prescriptions on File Prior to Visit  Medication Sig Dispense Refill  . aspirin 81 MG tablet Take 81 mg by mouth daily.        Marland Kitchen atorvastatin (LIPITOR) 80 MG tablet Take 80 mg by mouth daily.        . carvedilol (COREG) 12.5 MG tablet Take 1 tablet (12.5 mg total) by mouth 2 (two) times daily.  60 tablet  6  . pantoprazole (PROTONIX) 40 MG tablet Take 40 mg by mouth daily.        . sucralfate (CARAFATE) 1 G tablet Take 1 g by mouth 4 (four) times daily.        Marland Kitchen DISCONTD: furosemide (LASIX) 20 MG tablet Take 20 mg by mouth daily.        Marland Kitchen DISCONTD: lisinopril (PRINIVIL,ZESTRIL) 5 MG tablet Take 1 tablet (5 mg total) by mouth 2 (two) times daily.  60 tablet  11  . DISCONTD: carvedilol (COREG) 6.25 MG tablet Take 1 tablet (6.25 mg total) by mouth 2 (two) times daily.  60 tablet  11    No Known Allergies  Past Medical History  Diagnosis Date  . Polycythemia vera     [facility]  will restart his phlebotomies from about Mar 15 2011  . Stomach ulcer   . STEMI (ST elevation myocardial infarction) Sept 2012    Larger anterior apical MI with BMS to LAD with EF of 25 to 30%  . Tobacco abuse     stopped in April of 2012  . Duodenal perforation June 2012  .  Peritonitis June 2012  . S/P cardiac cath Sept 2012    BMS to LAD  . Pneumonia April 2012  . LV dysfunction Sept 2012    EF is 25%    Past Surgical History  Procedure Date  . Cholecystectomy 03/2011    Dr. Carolynne Edouard  . Colon surgery   . Cardiac catheterization Sept 2012    Normal left main, occluded LAD, 40% LCX and normal RCA. EF is 30%  . US echocardiography Sept 2012    EF 25 to 30% with akinesis of the mid to distal anterior and apical myocardium, trivial AI and no apical thrombus    History  Smoking status  . Former Smoker  . Types: Cigarettes  . Quit date: 03/04/2011  Smokeless tobacco  . Not on file  Comment: Quit smoking in April 2012. About 60-pack-year history of smoking    History  Alcohol Use No    socially    Family History  Problem Relation Age of Onset  . Lung disease Father     Review of Systems: The review of systems is per the HPI. He is walking daily  without problems.  His bruising has resolved with stopping Plavix. He was only on Plavix for 30 days due to GI history. All other systems were reviewed and are negative.  Physical Exam: BP 142/78  Pulse 66  Ht 6' (1.829 m)  Wt 201 lb 6.4 oz (91.354 kg)  BMI 27.31 kg/m2 Patient is very pleasant and in no acute distress. Skin is warm and dry. Color is normal.  HEENT is unremarkable. Normocephalic/atraumatic. PERRL. Sclera are nonicteric. Neck is supple. No masses. No JVD. Lungs are clear. No S3. Cardiac exam shows a regular rate and rhythm. Abdomen is soft. Extremities are without edema. Gait and ROM are intact. No gross neurologic deficits noted.   LABORATORY DATA: Pending   Assessment / Plan:

## 2011-09-06 NOTE — Assessment & Plan Note (Signed)
Improved with cessation of Plavix.

## 2011-09-06 NOTE — Patient Instructions (Signed)
You may take the Lasix (Furosemide) just as needed for swelling or for weight gain of 2 to 3 pounds overnight.  Increase the Lisinopril to 10 mg two times a day. You may take 2 of your 5 mg tablets two times a day to equal this dose. I have sent a prescription for the 10 mg tablets to the drug store.  I will see you in a month. Stay active. Keep walking and avoid salt.  We are going to check your kidney function today.  Call for any problems.

## 2011-09-06 NOTE — Assessment & Plan Note (Signed)
Blood pressure remains up. Lisinopril is increased today as well. I will see him back in a month.

## 2011-09-06 NOTE — Assessment & Plan Note (Signed)
EF is 25%. He looks compensated. I have changed his Lasix to as needed. Lisinopril is increased to 10 mg BID. I will see him back in 1 month. He is to continue to weigh daily and avoid salt. He will need an echo in a few months to reassess LV function. Patient is agreeable to this plan and will call if any problems develop in the interim.

## 2011-09-07 ENCOUNTER — Telehealth: Payer: Self-pay | Admitting: *Deleted

## 2011-09-07 NOTE — Telephone Encounter (Signed)
Notified of lab results. 

## 2011-09-07 NOTE — Telephone Encounter (Signed)
Message copied by Lorayne Bender on Wed Sep 07, 2011  1:21 PM ------      Message from: Rosalio Macadamia      Created: Tue Sep 06, 2011  2:10 PM       Ok to report. Labs are satisfactory.

## 2011-09-19 ENCOUNTER — Other Ambulatory Visit (HOSPITAL_BASED_OUTPATIENT_CLINIC_OR_DEPARTMENT_OTHER): Payer: Medicare Other | Admitting: Lab

## 2011-09-19 ENCOUNTER — Other Ambulatory Visit: Payer: Self-pay | Admitting: Oncology

## 2011-09-19 DIAGNOSIS — D45 Polycythemia vera: Secondary | ICD-10-CM

## 2011-09-19 LAB — CBC WITH DIFFERENTIAL/PLATELET
BASO%: 0.2 % (ref 0.0–2.0)
EOS%: 5.1 % (ref 0.0–7.0)
HCT: 43.8 % (ref 38.4–49.9)
LYMPH%: 23.3 % (ref 14.0–49.0)
MCH: 25 pg — ABNORMAL LOW (ref 27.2–33.4)
MCHC: 32 g/dL (ref 32.0–36.0)
MONO%: 6.5 % (ref 0.0–14.0)
NEUT%: 64.9 % (ref 39.0–75.0)
Platelets: 151 10*3/uL (ref 140–400)

## 2011-10-03 ENCOUNTER — Ambulatory Visit: Payer: Medicare Other | Admitting: Nurse Practitioner

## 2011-10-03 ENCOUNTER — Ambulatory Visit (INDEPENDENT_AMBULATORY_CARE_PROVIDER_SITE_OTHER): Payer: Medicare Other | Admitting: Nurse Practitioner

## 2011-10-03 ENCOUNTER — Telehealth: Payer: Self-pay | Admitting: *Deleted

## 2011-10-03 ENCOUNTER — Encounter: Payer: Self-pay | Admitting: Nurse Practitioner

## 2011-10-03 VITALS — BP 160/90 | HR 82 | Ht 72.0 in | Wt 202.0 lb

## 2011-10-03 DIAGNOSIS — I10 Essential (primary) hypertension: Secondary | ICD-10-CM

## 2011-10-03 DIAGNOSIS — I519 Heart disease, unspecified: Secondary | ICD-10-CM

## 2011-10-03 DIAGNOSIS — I2510 Atherosclerotic heart disease of native coronary artery without angina pectoris: Secondary | ICD-10-CM

## 2011-10-03 DIAGNOSIS — I5020 Unspecified systolic (congestive) heart failure: Secondary | ICD-10-CM

## 2011-10-03 DIAGNOSIS — I5189 Other ill-defined heart diseases: Secondary | ICD-10-CM

## 2011-10-03 LAB — BASIC METABOLIC PANEL
BUN: 19 mg/dL (ref 6–23)
CO2: 28 mEq/L (ref 19–32)
Calcium: 9.6 mg/dL (ref 8.4–10.5)
Chloride: 107 mEq/L (ref 96–112)
Creatinine, Ser: 1 mg/dL (ref 0.4–1.5)
GFR: 81.55 mL/min (ref >60.00)
Glucose, Bld: 84 mg/dL (ref 70–99)
Potassium: 4.5 mEq/L (ref 3.5–5.1)
Sodium: 143 mEq/L (ref 135–145)

## 2011-10-03 MED ORDER — LISINOPRIL 20 MG PO TABS: 20.0000 mg | ORAL_TABLET | Freq: Two times a day (BID) | ORAL | Status: DC

## 2011-10-03 NOTE — Telephone Encounter (Signed)
ADVISED OF RESULTS, AND TOLD HIM TO STAY ON HIS CURRENT MEDICATIONS

## 2011-10-03 NOTE — Progress Notes (Signed)
[name] Date of Birth: [birth year] Medical Record #[id number]  History of Present Illness: Mr. Chait is seen back today for a one month check. He is seen for Dr. Swaziland. He had a larger anterior apical MI with BMS to the LAD in September. He was felt to have presented late. EF is 25%. We are uptitrating his medicines for his low EF. Blood pressure has also been high. Still elevated at home in the 140-150/90 range. He feels ok. He has a chronic cough that is productive of white sputum. No fever or chills. Does have some itchy, watery eyes. No chest pain. Not short of breath. Not smoking. No swelling. Using Lasix just prn. Weights have been ok at home. Walking some. No dizziness. Tolerating his medicines ok but feels a little sleepy at times. His granddaughter [name] manages his pills for him.   Current Outpatient Prescriptions on File Prior to Visit  Medication Sig Dispense Refill  . aspirin 81 MG tablet Take 81 mg by mouth daily.        Marland Kitchen atorvastatin (LIPITOR) 80 MG tablet Take 80 mg by mouth daily.        . carvedilol (COREG) 12.5 MG tablet Take 1 tablet (12.5 mg total) by mouth 2 (two) times daily.  60 tablet  6  . furosemide (LASIX) 20 MG tablet Take 1 tablet (20 mg total) by mouth daily as needed. Use as needed for swelling or for weight gain of 2 to 3 pounds overnight.      . pantoprazole (PROTONIX) 40 MG tablet Take 40 mg by mouth daily.        . sucralfate (CARAFATE) 1 G tablet Take 1 g by mouth 4 (four) times daily.        Marland Kitchen DISCONTD: carvedilol (COREG) 6.25 MG tablet Take 1 tablet (6.25 mg total) by mouth 2 (two) times daily.  60 tablet  11    No Known Allergies  Past Medical History  Diagnosis Date  . Polycythemia vera     [facility]  will restart his phlebotomies from about Mar 15 2011  . Stomach ulcer   . STEMI (ST elevation myocardial infarction) Sept 2012    Larger anterior apical MI with BMS to LAD with  EF of 25 to 30%  . Tobacco abuse     stopped in April of 2012  . Duodenal perforation June 2012  . Peritonitis June 2012  . S/P cardiac cath Sept 2012    BMS to LAD  . Pneumonia April 2012  . LV dysfunction Sept 2012    EF is 25%    Past Surgical History  Procedure Date  . Cholecystectomy 03/2011    Dr. Carolynne Edouard  . Colon surgery   . Cardiac catheterization Sept 2012    Normal left main, occluded LAD, 40% LCX and normal RCA. EF is 30%  . US echocardiography Sept 2012    EF 25 to 30% with akinesis of the mid to distal anterior and apical myocardium, trivial AI and no apical thrombus    History  Smoking status  . Former Smoker  . Types: Cigarettes  . Quit date: 03/04/2011  Smokeless tobacco  . Not on file  Comment: Quit smoking in April 2012. About 60-pack-year history of smoking    History  Alcohol Use No    socially    Family History  Problem Relation Age of Onset  . Lung disease Father  Review of Systems: The review of systems is per the HPI.  All other systems were reviewed and are negative.  Physical Exam: BP 160/90  Pulse 82  Ht 6' (1.829 m)  Wt 202 lb (91.627 kg)  BMI 27.40 kg/m2 Patient is very pleasant and in no acute distress. Skin is warm and dry. Color is normal.  HEENT is unremarkable. Normocephalic/atraumatic. PERRL. Sclera are nonicteric. Neck is supple. No masses. No JVD. Lungs are coarse with some faint wheezes. Cardiac exam shows a regular rate and rhythm. Abdomen is soft. Extremities are without edema. Gait and ROM are intact. No gross neurologic deficits noted.  LABORATORY DATA:   Assessment / Plan:

## 2011-10-03 NOTE — Patient Instructions (Signed)
I want you to increase the Lisinopril to 20 mg two times a day. You may take two of your 10 mg tablets two times a day to equal this dose. I have sent the prescription for the 20 mg to the drugstore.  I will have Dr. Swaziland see you in one month.  Continue to weigh yourself each morning and record. Take extra dose of diuretic for weight gain of 3 pounds in 24 hours.  Call the office if you have any concerns. Limit sodium intake. Goal is to have less than 2000 mg (2gm) of salt per day.  Call the [facility] office at [phone] if you have any questions, problems or concerns.

## 2011-10-03 NOTE — Telephone Encounter (Signed)
Message copied by Royanne Foots on Mon Oct 03, 2011  4:36 PM ------      Message from: Rosalio Macadamia      Created: Mon Oct 03, 2011  4:34 PM       Ok to report. Labs are all normal. Continue with current medicines.

## 2011-10-03 NOTE — Assessment & Plan Note (Signed)
Doing well. No chest pain.

## 2011-10-03 NOTE — Assessment & Plan Note (Signed)
EF is 25%. He is using Lasix just prn. He continues to weigh daily. I have increased his Lisinopril to 20 mg BID. This is to also help with his blood pressure. I will have him see Dr. Swaziland in a month. He will need a repeat echo to reassess his EF. Patient is agreeable to this plan and will call if any problems develop in the interim.

## 2011-10-03 NOTE — Assessment & Plan Note (Signed)
Blood pressure remains high. I have increased the Lisinopril to 20 mg BID. He is to continue to monitor at home.

## 2011-10-17 ENCOUNTER — Other Ambulatory Visit: Payer: Self-pay | Admitting: Oncology

## 2011-10-17 ENCOUNTER — Other Ambulatory Visit (HOSPITAL_BASED_OUTPATIENT_CLINIC_OR_DEPARTMENT_OTHER): Payer: Medicare Other | Admitting: Lab

## 2011-10-17 DIAGNOSIS — D45 Polycythemia vera: Secondary | ICD-10-CM

## 2011-10-17 LAB — CBC WITH DIFFERENTIAL/PLATELET
BASO%: 0.4 % (ref 0.0–2.0)
Basophils Absolute: 0 10*3/uL (ref 0.0–0.1)
EOS%: 4.2 % (ref 0.0–7.0)
Eosinophils Absolute: 0.4 10*3/uL (ref 0.0–0.5)
HCT: 46.7 % (ref 38.4–49.9)
HGB: 14.7 g/dL (ref 13.0–17.1)
LYMPH%: 24.6 % (ref 14.0–49.0)
MCH: 25 pg — ABNORMAL LOW (ref 27.2–33.4)
MCHC: 31.5 g/dL — ABNORMAL LOW (ref 32.0–36.0)
MCV: 79.6 fL (ref 79.3–98.0)
MONO#: 0.7 10*3/uL (ref 0.1–0.9)
MONO%: 7.9 % (ref 0.0–14.0)
NEUT#: 5.4 10*3/uL (ref 1.5–6.5)
NEUT%: 62.9 % (ref 39.0–75.0)
Platelets: 168 10*3/uL (ref 140–400)
RBC: 5.87 10*6/uL — ABNORMAL HIGH (ref 4.20–5.82)
RDW: 16.7 % — ABNORMAL HIGH (ref 11.0–14.6)
WBC: 8.5 10*3/uL (ref 4.0–10.3)
lymph#: 2.1 10*3/uL (ref 0.9–3.3)
nRBC: 0 % (ref 0–0)

## 2011-11-07 ENCOUNTER — Ambulatory Visit: Payer: Medicare Other | Admitting: Cardiology

## 2011-11-14 ENCOUNTER — Ambulatory Visit (HOSPITAL_BASED_OUTPATIENT_CLINIC_OR_DEPARTMENT_OTHER): Payer: Medicare Other | Admitting: Physician Assistant

## 2011-11-14 ENCOUNTER — Other Ambulatory Visit: Payer: Self-pay | Admitting: Oncology

## 2011-11-14 ENCOUNTER — Encounter: Payer: Self-pay | Admitting: Physician Assistant

## 2011-11-14 ENCOUNTER — Other Ambulatory Visit (HOSPITAL_BASED_OUTPATIENT_CLINIC_OR_DEPARTMENT_OTHER): Payer: Medicare Other | Admitting: Lab

## 2011-11-14 VITALS — BP 164/80 | HR 76 | Temp 98.6°F | Ht 72.0 in | Wt 197.3 lb

## 2011-11-14 DIAGNOSIS — D45 Polycythemia vera: Secondary | ICD-10-CM

## 2011-11-14 DIAGNOSIS — Z87891 Personal history of nicotine dependence: Secondary | ICD-10-CM

## 2011-11-14 DIAGNOSIS — Z8701 Personal history of pneumonia (recurrent): Secondary | ICD-10-CM

## 2011-11-14 DIAGNOSIS — Z86718 Personal history of other venous thrombosis and embolism: Secondary | ICD-10-CM

## 2011-11-14 LAB — CBC WITH DIFFERENTIAL/PLATELET
BASO%: 0.3 % (ref 0.0–2.0)
Basophils Absolute: 0 10*3/uL (ref 0.0–0.1)
EOS%: 2.5 % (ref 0.0–7.0)
HGB: 15.6 g/dL (ref 13.0–17.1)
MCH: 25.5 pg — ABNORMAL LOW (ref 27.2–33.4)
RDW: 16.8 % — ABNORMAL HIGH (ref 11.0–14.6)
lymph#: 1.8 10*3/uL (ref 0.9–3.3)
nRBC: 0 % (ref 0–0)

## 2011-11-14 NOTE — Progress Notes (Signed)
Hematology and Oncology Follow Up Visit  TAKERU BOSE [id number] [birth year] 76 y.o. 11/14/2011    HPI: Mr. Eakins has a remote diagnosis of polycythemia vera and is followed [facility] the [facility] with regularly scheduled labs. He is treated with phlebotomy on an as-needed basis. The goal is to phlebotomize on a every 2 week basis if hematocrit is 50 or greater, discontinuing phlebotomy once hematocrit is 40 or less.  Patient has multiple comorbidities including history of community-acquired pneumonia, history of recurrent DVT, history of tobacco abuse, chronic bronchitis, history of CVA in 1996 in history of ST elevation myocardial infarction in September of 2012.  Interim History:   Mr. Kirn returns today for routine followup of his polycythemia vera. Since his last visit here in August, we have continue to check labs on a regular basis. His hematocrit is very slowly increasing, but is still less than 50 at 48 today. He has not needed a phlebotomy since July 2012.  Overall Mr. Detweiler is feeling well, with no new complaints. He continues to have some fatigue. He has shortness of breath and coughing, with quite a bit of phlegm production. He continues to be followed by his cardiologist regularly. Fortunately he has had no recent chest pain or pressure. He also denies any signs of abnormal bleeding. No signs of abnormal clotting.  A detailed review of systems is otherwise noncontributory as noted below.  Review of Systems: Constitutional:  Fatigue, no weight loss, fever, night sweats Eyes: negative NWG:NFAOZ congestion and nasal discharge Cardiovascular: no chest pain or dyspnea on exertion Respiratory: positive for - cough and shortness of breath negative for - hemoptysis or pleuritic pain Neurological: no TIA or stroke symptoms negative Dermatological: negative Gastrointestinal: no abdominal pain, nausea, change in bowel habits, or black or bloody stools Genito-Urinary: no  dysuria, trouble voiding, or hematuria Hematological and Lymphatic: negative Musculoskeletal: negative Remaining ROS negative.   Medications:   I have reviewed the patient's current medications.  Current Outpatient Prescriptions  Medication Sig Dispense Refill  . aspirin 81 MG tablet Take 81 mg by mouth daily.        Marland Kitchen atorvastatin (LIPITOR) 80 MG tablet Take 80 mg by mouth daily.        . carvedilol (COREG) 12.5 MG tablet Take 1 tablet (12.5 mg total) by mouth 2 (two) times daily.  60 tablet  6  . lisinopril (PRINIVIL,ZESTRIL) 20 MG tablet Take 1 tablet (20 mg total) by mouth 2 (two) times daily.  30 tablet  11  . pantoprazole (PROTONIX) 40 MG tablet Take 40 mg by mouth daily.        . sucralfate (CARAFATE) 1 G tablet Take 1 g by mouth 4 (four) times daily.        . furosemide (LASIX) 20 MG tablet Take 1 tablet (20 mg total) by mouth daily as needed. Use as needed for swelling or for weight gain of 2 to 3 pounds overnight.      Marland Kitchen DISCONTD: carvedilol (COREG) 6.25 MG tablet Take 1 tablet (6.25 mg total) by mouth 2 (two) times daily.  60 tablet  11    Allergies: No Known Allergies  Physical Exam: Filed Vitals:   11/14/11 1334  BP: 164/80  Pulse: 76  Temp: 98.6 F (37 C)   HEENT:  Sclerae anicteric, conjunctivae pink.  Oropharynx clear.  Buccal mucosa pink and moist. No mucositis or candidiasis.   Nodes:  No cervical, supraclavicular, or axillary lymphadenopathy palpated.   Lungs:  Coarse breath sounds bilaterally with diffuse expiratory wheezes Heart:  Regular rate and rhythm.   Abdomen:  Soft, nontender.  Positive bowel sounds.  No organomegaly or masses palpated.   Musculoskeletal:  No focal spinal tenderness to palpation.  Extremities:  Benign.  No peripheral edema or cyanosis.   Skin:  Benign.   Neuro:  Nonfocal.   Lab Results: Lab Results  Component Value Date   WBC 9.3 11/14/2011   HGB 15.6 11/14/2011   HCT 48.0 11/14/2011   MCV 78.4* 11/14/2011   PLT 156 11/14/2011    NEUTROABS 6.2 11/14/2011     Chemistry      Component Value Date/Time   NA 143 10/03/2011 1143   K 4.5 10/03/2011 1143   CL 107 10/03/2011 1143   CO2 28 10/03/2011 1143   BUN 19 10/03/2011 1143   CREATININE 1.0 10/03/2011 1143      Component Value Date/Time   CALCIUM 9.6 10/03/2011 1143   ALKPHOS 167* 06/22/2011 0943   AST 57* 06/22/2011 0943   ALT 104* 06/22/2011 0943   BILITOT 0.4 06/22/2011 0943        Radiological Studies:  No results found.   Assessment:  76 year old [place] male with history of polycythemia vera, treated with phlebotomy as needed on a every 2 week basis. Last phlebotomized in July 2012. The goal is to phlebotomize him a every 2 week basis once hematocrit is 50 or greater. Continue phlebotomy until hematocrit is 40 or less.  Several comorbidities including: History of community-acquired pneumonia in the right lower lobe; history of recurrent DVT; history of tobacco abuse; chronic bronchitis; CVA in 1996; and ST elevation myocardial infarction in September 2012.  Plan:  We will continue with the current regimen, repeating Mr. Duris labs on monthly basis, and seeing him every 2 months for physical exam and followup. Accordingly, we will repeat his labs in February, then Dr. Darnelle Catalan will see him in March of 2013. He'll continue to followup with his cardiologist, Dr. Swaziland, oras well.   This plan was reviewed with the patient, who voices understanding and agreement.  She knows to call with any changes or problems.    Kamila Broda, PA-C 11/14/2011

## 2011-11-30 ENCOUNTER — Encounter: Payer: Self-pay | Admitting: Cardiology

## 2011-11-30 ENCOUNTER — Ambulatory Visit (INDEPENDENT_AMBULATORY_CARE_PROVIDER_SITE_OTHER): Payer: Medicare Other | Admitting: Cardiology

## 2011-11-30 VITALS — BP 138/82 | HR 80 | Ht 72.0 in | Wt 192.2 lb

## 2011-11-30 DIAGNOSIS — I255 Ischemic cardiomyopathy: Secondary | ICD-10-CM

## 2011-11-30 DIAGNOSIS — I10 Essential (primary) hypertension: Secondary | ICD-10-CM

## 2011-11-30 DIAGNOSIS — I2510 Atherosclerotic heart disease of native coronary artery without angina pectoris: Secondary | ICD-10-CM

## 2011-11-30 DIAGNOSIS — E785 Hyperlipidemia, unspecified: Secondary | ICD-10-CM

## 2011-11-30 DIAGNOSIS — I5020 Unspecified systolic (congestive) heart failure: Secondary | ICD-10-CM

## 2011-11-30 DIAGNOSIS — I2589 Other forms of chronic ischemic heart disease: Secondary | ICD-10-CM

## 2011-11-30 DIAGNOSIS — I252 Old myocardial infarction: Secondary | ICD-10-CM

## 2011-11-30 NOTE — Patient Instructions (Addendum)
Continue your current medications.  We will schedule you for an Echocardiogram  I will see you back in 3 weeks with fasting lab work.  Your physician recommends that you return for a FASTING lipid profile: 3 weeks  Your physician recommends that you schedule a follow-up appointment in: 3 months  Your physician has requested that you have an echocardiogram. Echocardiography is a painless test that uses sound waves to create images of your heart. It provides your doctor with information about the size and shape of your heart and how well your heart's chambers and valves are working. This procedure takes approximately one hour. There are no restrictions for this procedure.

## 2011-12-01 NOTE — Progress Notes (Signed)
[name] Date of Birth: 11-23-1935 Medical Record #[id number]  History of Present Illness: Mr. Chriswell is seen back today for a followup visit.  He had a larger anterior apical MI with BMS to the LAD in September. He was felt to have presented late. EF is 25%. We are uptitrating his medicines for his low EF. He reports his recent blood pressures at home have been in the 130 systolic range with diastolics in the 90s. He has had no edema or shortness of breath. He denies any chest pain or palpitations. He is tolerating his medications well.  Current Outpatient Prescriptions on File Prior to Visit  Medication Sig Dispense Refill  . aspirin 81 MG tablet Take 81 mg by mouth daily.        Marland Kitchen atorvastatin (LIPITOR) 80 MG tablet Take 80 mg by mouth daily.        . carvedilol (COREG) 12.5 MG tablet Take 1 tablet (12.5 mg total) by mouth 2 (two) times daily.  60 tablet  6  . furosemide (LASIX) 20 MG tablet Take 1 tablet (20 mg total) by mouth daily as needed. Use as needed for swelling or for weight gain of 2 to 3 pounds overnight.      Marland Kitchen lisinopril (PRINIVIL,ZESTRIL) 20 MG tablet Take 1 tablet (20 mg total) by mouth 2 (two) times daily.  30 tablet  11  . pantoprazole (PROTONIX) 40 MG tablet Take 40 mg by mouth daily.        . sucralfate (CARAFATE) 1 G tablet Take 1 g by mouth 4 (four) times daily.        Marland Kitchen DISCONTD: carvedilol (COREG) 6.25 MG tablet Take 1 tablet (6.25 mg total) by mouth 2 (two) times daily.  60 tablet  11    No Known Allergies  Past Medical History  Diagnosis Date  . Polycythemia vera     [facility]  will restart his phlebotomies from about Mar 15 2011  . Stomach ulcer   . STEMI (ST elevation myocardial infarction) Sept 2012    Larger anterior apical MI with BMS to LAD with EF of 25 to 30%  . Tobacco abuse     stopped in April of 2012  . Duodenal perforation June 2012  . Peritonitis June 2012  . S/P cardiac  cath Sept 2012    BMS to LAD  . Pneumonia April 2012  . LV dysfunction Sept 2012    EF is 25%    Past Surgical History  Procedure Date  . Cholecystectomy 03/2011    Dr. Carolynne Edouard  . Colon surgery   . Cardiac catheterization Sept 2012    Normal left main, occluded LAD, 40% LCX and normal RCA. EF is 30%  . US echocardiography Sept 2012    EF 25 to 30% with akinesis of the mid to distal anterior and apical myocardium, trivial AI and no apical thrombus    History  Smoking status  . Former Smoker  . Types: Cigarettes  . Quit date: 03/04/2011  Smokeless tobacco  . Not on file  Comment: Quit smoking in April 2012. About 60-pack-year history of smoking    History  Alcohol Use No    socially    Family History  Problem Relation Age of Onset  . Lung disease Father     Review of Systems: The review of systems is per the HPI.  All other systems were reviewed and are negative.  Physical Exam: BP 138/82  Pulse 80  Ht 6' (1.829 m)  Wt 192 lb 3.2 oz (87.181 kg)  BMI 26.07 kg/m2 Patient is very pleasant and in no acute distress. Skin is warm and dry. Color is normal.  HEENT is unremarkable. Normocephalic/atraumatic. PERRL. Sclera are nonicteric. Neck is supple. No masses. No JVD. Lungs are coarse with some faint wheezes. Cardiac exam shows a regular rate and rhythm. Abdomen is soft. Extremities are without edema. Gait and ROM are intact. No gross neurologic deficits noted.  LABORATORY DATA:   Assessment / Plan:

## 2011-12-01 NOTE — Assessment & Plan Note (Signed)
Blood pressure is acceptable today. We will continue on his current therapy.

## 2011-12-01 NOTE — Assessment & Plan Note (Signed)
He has had no recurrent anginal symptoms. We'll continue therapy with aspirin, beta blocker, and statins.

## 2011-12-01 NOTE — Assessment & Plan Note (Signed)
He is tolerating his medications well. His blood pressure has improved significantly. We'll reassess his LV function by echocardiogram. If it is still low he will need to be considered for prophylactic ICD placement.

## 2011-12-12 ENCOUNTER — Other Ambulatory Visit (HOSPITAL_BASED_OUTPATIENT_CLINIC_OR_DEPARTMENT_OTHER): Payer: Medicare Other | Admitting: Lab

## 2011-12-12 DIAGNOSIS — D45 Polycythemia vera: Secondary | ICD-10-CM

## 2011-12-12 LAB — CBC WITH DIFFERENTIAL/PLATELET
Eosinophils Absolute: 0.3 10*3/uL (ref 0.0–0.5)
MONO#: 1 10*3/uL — ABNORMAL HIGH (ref 0.1–0.9)
NEUT#: 6 10*3/uL (ref 1.5–6.5)
Platelets: 163 10*3/uL (ref 140–400)
RBC: 6.57 10*6/uL — ABNORMAL HIGH (ref 4.20–5.82)
RDW: 16.5 % — ABNORMAL HIGH (ref 11.0–14.6)
WBC: 9.7 10*3/uL (ref 4.0–10.3)
lymph#: 2.4 10*3/uL (ref 0.9–3.3)

## 2011-12-14 ENCOUNTER — Encounter: Payer: Self-pay | Admitting: *Deleted

## 2011-12-14 ENCOUNTER — Telehealth: Payer: Self-pay | Admitting: *Deleted

## 2011-12-14 NOTE — Progress Notes (Signed)
Per MD, pt is to resume phleb. Weekly. Have notified scheduling

## 2011-12-14 NOTE — Telephone Encounter (Signed)
patient was confused about why he is needing to the phebl appointments will leave the nurse a note to have her call the patient before I make the appointments

## 2011-12-27 ENCOUNTER — Ambulatory Visit (HOSPITAL_COMMUNITY): Payer: Medicare Other | Attending: Cardiology

## 2011-12-27 ENCOUNTER — Other Ambulatory Visit (INDEPENDENT_AMBULATORY_CARE_PROVIDER_SITE_OTHER): Payer: Medicare Other

## 2011-12-27 ENCOUNTER — Other Ambulatory Visit: Payer: Self-pay

## 2011-12-27 DIAGNOSIS — I2510 Atherosclerotic heart disease of native coronary artery without angina pectoris: Secondary | ICD-10-CM | POA: Insufficient documentation

## 2011-12-27 DIAGNOSIS — I255 Ischemic cardiomyopathy: Secondary | ICD-10-CM

## 2011-12-27 DIAGNOSIS — E785 Hyperlipidemia, unspecified: Secondary | ICD-10-CM

## 2011-12-27 DIAGNOSIS — I080 Rheumatic disorders of both mitral and aortic valves: Secondary | ICD-10-CM | POA: Insufficient documentation

## 2011-12-27 DIAGNOSIS — I079 Rheumatic tricuspid valve disease, unspecified: Secondary | ICD-10-CM | POA: Insufficient documentation

## 2011-12-27 LAB — BASIC METABOLIC PANEL
CO2: 29 mEq/L (ref 19–32)
Glucose, Bld: 83 mg/dL (ref 70–99)
Potassium: 4.2 mEq/L (ref 3.5–5.1)
Sodium: 139 mEq/L (ref 135–145)

## 2011-12-27 LAB — LIPID PANEL
HDL: 35.3 mg/dL — ABNORMAL LOW (ref >39.00)
Total CHOL/HDL Ratio: 3
Triglycerides: 85 mg/dL (ref 0.0–149.0)
VLDL: 17 mg/dL (ref 0.0–40.0)

## 2011-12-29 NOTE — Op Note (Deleted)
NAME:  [name], Jesus Sanders NO.:  [id number]  MEDICAL RECORD NO.:  [id number]  LOCATION:  [facility]                         FACILITY:  [facility]  PHYSICIAN:  Marlowe Kays, M.D.  DATE OF BIRTH:  [birth year]  DATE OF PROCEDURE:  12/28/2011 DATE OF DISCHARGE:                              OPERATIVE REPORT   PREOPERATIVE DIAGNOSIS:  Painful left shoulder with: 1. Possible labral tear. 2. Chronic impingement syndrome.  POSTOPERATIVE DIAGNOSIS:  Chronic impingement syndrome with rotator cuff tendinopathy.  OPERATION:  Left shoulder arthroscopy with inspection of the glenohumeral joint (within normal limits) and arthroscopic subacromial decompression.  SURGEON:  Marlowe Kays, M.D.  ASSISTANTDruscilla Brownie. Cherlynn June.  ANESTHESIA:  General preceded by interscalene block.  PATHOLOGY AND JUSTIFICATION FOR PROCEDURE:  Because of painful left shoulder, he had MRI performed, which looked relatively normal with possible labral tear.  Certainly symptomatically, he had rotator cuff pathology with pain and tenderness about the entire rotator cuff as well as loss of function.  PROCEDURE IN DETAIL:  Satisfactory interscalene block by anesthesia, Mr. Angie Fava assistance was necessary to help hold the arm, manipulate the arm, and assist with retraction.  Beach-chair position on the sliding frame, left shoulder girdle was prepped with the DuraPrep, draped in sterile field.  Anatomy of the shoulder joint was marked out and for hemostatic purposes, site for posterior and lateral portals and subacromial space were infiltrated with Marcaine with adrenaline.  Left shoulder was draped in a sterile field.  Time-out performed.  First through a posterior portal, the glenohumeral joint was entered with findings being normal.  Representative pictures were taken.  There was no labral tear.  There was a small indentation, which was superficial over the posterior rim of the glenoid, but this  was superficial and there was no evidence for any inflammatory or pathologic changes in terms of inflammation or injury and attempted repair.  I then redirected the scope in the subacromial space.  There was marked inflammatory reaction there with abrasion of the rotator cuff.  Prominent coracoacromial ligament was also noted.  Through a lateral portal, I introduced a 4.2 shaver and began cleaning up the soft tissue in the bursa and then followed this with the ArthroCare 90-degree vaporizer removing soft tissue from the undersurface of the acromion.  We then brought in a 4- mm oval bur and began burring down the very tight impingement in the subacromial space.  I worked back and forth between the bur and the vaporizer with a 4.2 shaver until we had a nice wide decompression with a clean subacromial space.  All fluid possible was removed from the subacromial space and the 2 portals were closed with 4-0 nylon followed by Betadine, Adaptic, dry sterile dressing, and shoulder immobilizer. He tolerated the procedure well, was taken to recovery room in satisfactory condition with no known complications.          ______________________________ Marlowe Kays, M.D.     JA/MEDQ  D:  12/28/2011  T:  12/29/2011  Job:  [id number]

## 2011-12-30 ENCOUNTER — Other Ambulatory Visit: Payer: Self-pay

## 2011-12-30 DIAGNOSIS — I2589 Other forms of chronic ischemic heart disease: Secondary | ICD-10-CM

## 2012-01-04 ENCOUNTER — Ambulatory Visit (INDEPENDENT_AMBULATORY_CARE_PROVIDER_SITE_OTHER): Payer: Medicare Other | Admitting: Internal Medicine

## 2012-01-04 ENCOUNTER — Encounter: Payer: Self-pay | Admitting: Internal Medicine

## 2012-01-04 ENCOUNTER — Encounter: Payer: Self-pay | Admitting: *Deleted

## 2012-01-04 ENCOUNTER — Encounter (HOSPITAL_COMMUNITY): Payer: Self-pay | Admitting: Pharmacy Technician

## 2012-01-04 DIAGNOSIS — I255 Ischemic cardiomyopathy: Secondary | ICD-10-CM | POA: Insufficient documentation

## 2012-01-04 DIAGNOSIS — I2589 Other forms of chronic ischemic heart disease: Secondary | ICD-10-CM

## 2012-01-04 DIAGNOSIS — I428 Other cardiomyopathies: Secondary | ICD-10-CM

## 2012-01-04 DIAGNOSIS — I5022 Chronic systolic (congestive) heart failure: Secondary | ICD-10-CM

## 2012-01-04 DIAGNOSIS — I10 Essential (primary) hypertension: Secondary | ICD-10-CM

## 2012-01-04 LAB — CBC WITH DIFFERENTIAL/PLATELET
Basophils Absolute: 0 10*3/uL (ref 0.0–0.1)
Eosinophils Relative: 3.8 % (ref 0.0–5.0)
MCV: 78.3 fl (ref 78.0–100.0)
Monocytes Absolute: 0.7 10*3/uL (ref 0.1–1.0)
Monocytes Relative: 7.3 % (ref 3.0–12.0)
Neutrophils Relative %: 67.3 % (ref 43.0–77.0)
Platelets: 153 10*3/uL (ref 150.0–400.0)
RDW: 18.1 % — ABNORMAL HIGH (ref 11.5–14.6)
WBC: 9.6 10*3/uL (ref 4.5–10.5)

## 2012-01-04 LAB — BASIC METABOLIC PANEL
GFR: 84.53 mL/min (ref >60.00)
Glucose, Bld: 83 mg/dL (ref 70–99)
Potassium: 4.2 mEq/L (ref 3.5–5.1)
Sodium: 139 mEq/L (ref 135–145)

## 2012-01-04 LAB — PROTIME-INR: INR: 0.9 ratio (ref 0.8–1.0)

## 2012-01-04 MED ORDER — SPIRONOLACTONE 25 MG PO TABS: 12.5000 mg | ORAL_TABLET | Freq: Every day | ORAL | Status: DC

## 2012-01-04 NOTE — Patient Instructions (Signed)
Your physician recommends that you return for lab work in: today See instruction sheet for defibrillator implant.

## 2012-01-04 NOTE — Assessment & Plan Note (Signed)
As above.

## 2012-01-04 NOTE — Assessment & Plan Note (Signed)
As above He has persistent left ventricular dysfunction. It is appropriate to consider him for an ICD. Benefits are somewhat mitigated by his relative age but he is still young enough that I think it is a reasonable undertaking. Have reviewed the potential benefits and risks of ICD implantation including but not limited to death, perforation of heart or lung, lead dislodgement, infection,  device malfunction and inappropriate shocks.  The patient and family express understanding  and are willing to proceed.

## 2012-01-04 NOTE — Progress Notes (Signed)
 History and Physical  Patient ID: Jesus Sanders MRN: [id number], SOB: 10/16/1936 75 y.o. Date of Encounter: 01/04/2012, 12:01 PM  Primary Physician: No primary provider on file. Primary Cardiologist: PJ Primary Electrophysiologist:  SK  Chief Complaint: icd  History of Present Illness: Jesus Sanders is a 75 y.o. male seen at the request of Dr. PJ for consideration of an ICD. He is status post LAD inferoapical infarction that was treated with bare-metal stenting after relatively late presentation. His ejection fraction is and has remained 20-25% despite guidelines recommend medical therapy.  He has moderate shortness of breath; he is unable to climb a flight of stairs or walk to his daughter's house next door. His occasional peripheral edema and rare nocturnal dyspnea. He's had no syncope or palpitations.   Past Medical History  Diagnosis Date  . Polycythemia vera     [facility]  will restart his phlebotomies from about Mar 15 2011  . Stomach ulcer   . STEMI (ST elevation myocardial infarction) Sept 2012    Larger anterior apical MI with BMS to LAD with EF of 25 to 30%  . Tobacco abuse     stopped in April of 2012  . Duodenal perforation June 2012  . Peritonitis June 2012  . S/P cardiac cath Sept 2012    BMS to LAD  . Pneumonia April 2012  . LV dysfunction Sept 2012    EF is 25%     Past Surgical History  Procedure Date  . Cholecystectomy 03/2011    Dr. Toth  . Colon surgery   . Cardiac catheterization Sept 2012    Normal left main, occluded LAD, 40% LCX and normal RCA. EF is 30%  . Us echocardiography Sept 2012    EF 25 to 30% with akinesis of the mid to distal anterior and apical myocardium, trivial AI and no apical thrombus      Current Outpatient Prescriptions  Medication Sig Dispense Refill  . aspirin 81 MG tablet Take 81 mg by mouth daily.        . atorvastatin (LIPITOR) 80 MG tablet Take 80  mg by mouth daily.        . carvedilol (COREG) 12.5 MG tablet Take 1 tablet (12.5 mg total) by mouth 2 (two) times daily.  60 tablet  6  . Chlorpheniramine Maleate (ALLERGY RELIEF PO) Take by mouth daily.      . furosemide (LASIX) 20 MG tablet Take 1 tablet (20 mg total) by mouth daily as needed. Use as needed for swelling or for weight gain of 2 to 3 pounds overnight.      . lisinopril (PRINIVIL,ZESTRIL) 20 MG tablet Take 1 tablet (20 mg total) by mouth 2 (two) times daily.  30 tablet  11  . pantoprazole (PROTONIX) 40 MG tablet Take 40 mg by mouth daily.        . sucralfate (CARAFATE) 1 G tablet Take 1 g by mouth 4 (four) times daily.        . spironolactone (ALDACTONE) 25 MG tablet Take 0.5 tablets (12.5 mg total) by mouth daily.  15 tablet  6  . DISCONTD: carvedilol (COREG) 6.25 MG tablet Take 1 tablet (6.25 mg total) by mouth 2 (two) times daily.  60 tablet  11     Allergies: No Known Allergies   History  Substance Use Topics  . Smoking status: Former Smoker    Types: Cigarettes    Quit   date: 03/04/2011  . Smokeless tobacco: Not on file   Comment: Quit smoking in April 2012. About 60-pack-year history of smoking  . Alcohol Use: No     socially      Family History  Problem Relation Age of Onset  . Lung disease Father       ROS:  Please see the history of present illness.     All other systems reviewed and negative.   Vital Signs: Blood pressure 158/90, pulse 67, height 6' (1.829 m), weight 196 lb (88.905 kg).  PHYSICAL EXAM: General:  Well nourished, well developed male in no acute distress HEENT: normal Lymph: no adenopathy Neck: no JVD Endocrine:  No thryomegaly Vascular: No carotid bruits; FA pulses 2+ bilaterally without bruits Cardiac:  normal S1, S2; RRR; no murmur Back: without kyphosis/scoliosis, no CVA tenderness Lungs:  Decreased breath sounds throughout Abd: soft, nontender, no hepatomegaly Ext: trace edema Musculoskeletal:  No deformities, BUE and BLE  strength normal and equal Skin: warm and dry Neuro:  CNs 2-12 intact, no focal abnormalities noted Psych:  Normal affect   EKG:  Sinus rhythm at 67 Intervals 0.17/0.10/0.41 Axis is leftward at -58 There are Q waves in V1-V5 with deep T-wave inversions  Labs:   Lab Results  Component Value Date   WBC 9.7 12/12/2011   HGB 16.6 12/12/2011   HCT 50.8* 12/12/2011   MCV 77.3* 12/12/2011   PLT 163 12/12/2011   No results found for this basename: NA,K,CL,CO2,BUN,CREATININE,CALCIUM,LABALBU,PROT,BILITOT,ALKPHOS,ALT,AST,GLUCOSE in the last 168 hours No results found for this basename: CKTOTAL:4,CKMB:4,TROPONINI:4 in the last 72 hours Lab Results  Component Value Date   CHOL 106 12/27/2011   HDL 35.30* 12/27/2011   LDLCALC 54 12/27/2011   TRIG 85.0 12/27/2011   No results found for this basename: DDIMER   BNP Pro B Natriuretic peptide (BNP)  Date/Time Value Range Status  07/19/2011  6:15 AM 2531.0* 0-125 (pg/mL) Final  07/15/2011 11:49 AM 7289.0* 0-125 (pg/mL) Final  03/04/2011  4:41 PM 78.0  0.0-100.0 (pg/mL) Final       ASSESSMENT AND PLAN:         

## 2012-01-04 NOTE — Assessment & Plan Note (Addendum)
The patient has class 2-3 symptoms probably related in part to COPD but also has evidence of volume overload. We will begin him on Aldactone as part of guideline directed therapy and will follow up his potassium levels a couple of weeks  renal function and potassium levels were normal a couple weeks.

## 2012-01-11 ENCOUNTER — Ambulatory Visit (HOSPITAL_COMMUNITY)
Admission: RE | Admit: 2012-01-11 | Discharge: 2012-01-12 | Disposition: A | Discharge status: Home / Self Care | Payer: Medicare Other | Source: Ambulatory Visit | Attending: Internal Medicine | Admitting: Internal Medicine

## 2012-01-11 ENCOUNTER — Encounter (HOSPITAL_COMMUNITY)
Admission: RE | Disposition: A | Discharge status: Home / Self Care | Payer: Self-pay | Source: Ambulatory Visit | Attending: Internal Medicine

## 2012-01-11 DIAGNOSIS — I10 Essential (primary) hypertension: Secondary | ICD-10-CM | POA: Insufficient documentation

## 2012-01-11 DIAGNOSIS — I2589 Other forms of chronic ischemic heart disease: Secondary | ICD-10-CM

## 2012-01-11 DIAGNOSIS — I252 Old myocardial infarction: Secondary | ICD-10-CM | POA: Insufficient documentation

## 2012-01-11 DIAGNOSIS — K219 Gastro-esophageal reflux disease without esophagitis: Secondary | ICD-10-CM | POA: Insufficient documentation

## 2012-01-11 DIAGNOSIS — I255 Ischemic cardiomyopathy: Secondary | ICD-10-CM | POA: Insufficient documentation

## 2012-01-11 DIAGNOSIS — Z9861 Coronary angioplasty status: Secondary | ICD-10-CM | POA: Insufficient documentation

## 2012-01-11 DIAGNOSIS — Z87891 Personal history of nicotine dependence: Secondary | ICD-10-CM | POA: Insufficient documentation

## 2012-01-11 HISTORY — PX: IMPLANTABLE CARDIOVERTER DEFIBRILLATOR IMPLANT: SHX5473

## 2012-01-11 SURGERY — IMPLANTABLE CARDIOVERTER DEFIBRILLATOR IMPLANT
Anesthesia: Moderate Sedation

## 2012-01-11 MED ORDER — ASPIRIN EC 81 MG PO TBEC
81.0000 mg | DELAYED_RELEASE_TABLET | Freq: Every day | ORAL | Status: DC
  Administered 2012-01-12: 81 mg via ORAL
  Filled 2012-01-11: qty 1

## 2012-01-11 MED ORDER — MUPIROCIN 2 % EX OINT
TOPICAL_OINTMENT | Freq: Two times a day (BID) | CUTANEOUS | Status: DC
  Administered 2012-01-11 – 2012-01-12 (×2): via NASAL
  Filled 2012-01-11 (×2): qty 22

## 2012-01-11 MED ORDER — GUAIFENESIN ER 600 MG PO TB12
1200.0000 mg | ORAL_TABLET | Freq: Two times a day (BID) | ORAL | Status: DC
  Administered 2012-01-11 – 2012-01-12 (×3): 1200 mg via ORAL
  Filled 2012-01-11 (×5): qty 2

## 2012-01-11 MED ORDER — MIDAZOLAM HCL 5 MG/5ML IJ SOLN
INTRAMUSCULAR | Status: AC
  Filled 2012-01-11: qty 5

## 2012-01-11 MED ORDER — SODIUM CHLORIDE 0.9 % IR SOLN
80.0000 mg | Status: DC
  Filled 2012-01-11: qty 2

## 2012-01-11 MED ORDER — CEFAZOLIN SODIUM-DEXTROSE 2-3 GM-% IV SOLR: 2.0000 g | INTRAVENOUS | Status: DC

## 2012-01-11 MED ORDER — LIDOCAINE HCL (PF) 1 % IJ SOLN
INTRAMUSCULAR | Status: AC
  Filled 2012-01-11: qty 60

## 2012-01-11 MED ORDER — ATORVASTATIN CALCIUM 80 MG PO TABS
80.0000 mg | ORAL_TABLET | Freq: Every day | ORAL | Status: DC
  Administered 2012-01-11 – 2012-01-12 (×2): 80 mg via ORAL
  Filled 2012-01-11 (×2): qty 1

## 2012-01-11 MED ORDER — MUPIROCIN 2 % EX OINT
TOPICAL_OINTMENT | CUTANEOUS | Status: AC
  Filled 2012-01-11: qty 22

## 2012-01-11 MED ORDER — PANTOPRAZOLE SODIUM 40 MG PO TBEC
40.0000 mg | DELAYED_RELEASE_TABLET | Freq: Every day | ORAL | Status: DC
  Administered 2012-01-11 – 2012-01-12 (×2): 40 mg via ORAL
  Filled 2012-01-11 (×2): qty 1

## 2012-01-11 MED ORDER — SPIRONOLACTONE 12.5 MG HALF TABLET
12.5000 mg | ORAL_TABLET | Freq: Every day | ORAL | Status: DC
  Administered 2012-01-11 – 2012-01-12 (×2): 12.5 mg via ORAL
  Filled 2012-01-11 (×2): qty 1

## 2012-01-11 MED ORDER — FENTANYL CITRATE 0.05 MG/ML IJ SOLN
INTRAMUSCULAR | Status: AC
  Filled 2012-01-11: qty 2

## 2012-01-11 MED ORDER — CARVEDILOL 6.25 MG PO TABS
18.7500 mg | ORAL_TABLET | Freq: Two times a day (BID) | ORAL | Status: DC
  Administered 2012-01-11 – 2012-01-12 (×2): 18.75 mg via ORAL
  Filled 2012-01-11 (×4): qty 1

## 2012-01-11 MED ORDER — SODIUM CHLORIDE 0.9 % IV SOLN: INTRAVENOUS | Status: DC

## 2012-01-11 MED ORDER — ACETAMINOPHEN 325 MG PO TABS: 325.0000 mg | ORAL_TABLET | ORAL | Status: DC | PRN

## 2012-01-11 MED ORDER — HEPARIN (PORCINE) IN NACL 2-0.9 UNIT/ML-% IJ SOLN
INTRAMUSCULAR | Status: AC
  Filled 2012-01-11: qty 1000

## 2012-01-11 MED ORDER — CEFAZOLIN SODIUM-DEXTROSE 2-3 GM-% IV SOLR
INTRAVENOUS | Status: AC
  Filled 2012-01-11: qty 50

## 2012-01-11 MED ORDER — LISINOPRIL 20 MG PO TABS
20.0000 mg | ORAL_TABLET | Freq: Two times a day (BID) | ORAL | Status: DC
  Administered 2012-01-11 – 2012-01-12 (×2): 20 mg via ORAL
  Filled 2012-01-11 (×3): qty 1

## 2012-01-11 MED ORDER — ONDANSETRON HCL 4 MG/2ML IJ SOLN: 4.0000 mg | Freq: Four times a day (QID) | INTRAMUSCULAR | Status: DC | PRN

## 2012-01-11 MED ORDER — CHLORHEXIDINE GLUCONATE 4 % EX LIQD
60.0000 mL | Freq: Once | CUTANEOUS | Status: DC
  Filled 2012-01-11: qty 60

## 2012-01-11 MED ORDER — LIDOCAINE HCL (PF) 1 % IJ SOLN
INTRAMUSCULAR | Status: AC
  Filled 2012-01-11: qty 30

## 2012-01-11 MED ORDER — CEFAZOLIN SODIUM 1-5 GM-% IV SOLN
1.0000 g | Freq: Four times a day (QID) | INTRAVENOUS | Status: AC
  Administered 2012-01-11 – 2012-01-12 (×3): 1 g via INTRAVENOUS
  Filled 2012-01-11 (×5): qty 50

## 2012-01-11 NOTE — H&P (View-Only) (Signed)
History and Physical  Patient ID: Jesus Sanders MRN: [id number], SOB: 04/08/1936 76 y.o. Date of Encounter: 01/04/2012, 12:01 PM  Primary Physician: No primary provider on file. Primary Cardiologist: PJ Primary Electrophysiologist:  SK  Chief Complaint: icd  History of Present Illness: Jesus Sanders is a 76 y.o. male seen at the request of Dr. Garth Bigness for consideration of an ICD. He is status post LAD inferoapical infarction that was treated with bare-metal stenting after relatively late presentation. His ejection fraction is and has remained 20-25% despite guidelines recommend medical therapy.  He has moderate shortness of breath; he is unable to climb a flight of stairs or walk to his daughter's house next door. His occasional peripheral edema and rare nocturnal dyspnea. He's had no syncope or palpitations.   Past Medical History  Diagnosis Date  . Polycythemia vera     [facility]  will restart his phlebotomies from about Mar 15 2011  . Stomach ulcer   . STEMI (ST elevation myocardial infarction) Sept 2012    Larger anterior apical MI with BMS to LAD with EF of 25 to 30%  . Tobacco abuse     stopped in April of 2012  . Duodenal perforation June 2012  . Peritonitis June 2012  . S/P cardiac cath Sept 2012    BMS to LAD  . Pneumonia April 2012  . LV dysfunction Sept 2012    EF is 25%     Past Surgical History  Procedure Date  . Cholecystectomy 03/2011    Dr. Carolynne Edouard  . Colon surgery   . Cardiac catheterization Sept 2012    Normal left main, occluded LAD, 40% LCX and normal RCA. EF is 30%  . US echocardiography Sept 2012    EF 25 to 30% with akinesis of the mid to distal anterior and apical myocardium, trivial AI and no apical thrombus      Current Outpatient Prescriptions  Medication Sig Dispense Refill  . aspirin 81 MG tablet Take 81 mg by mouth daily.        Marland Kitchen atorvastatin (LIPITOR) 80 MG tablet Take 80  mg by mouth daily.        . carvedilol (COREG) 12.5 MG tablet Take 1 tablet (12.5 mg total) by mouth 2 (two) times daily.  60 tablet  6  . Chlorpheniramine Maleate (ALLERGY RELIEF PO) Take by mouth daily.      . furosemide (LASIX) 20 MG tablet Take 1 tablet (20 mg total) by mouth daily as needed. Use as needed for swelling or for weight gain of 2 to 3 pounds overnight.      Marland Kitchen lisinopril (PRINIVIL,ZESTRIL) 20 MG tablet Take 1 tablet (20 mg total) by mouth 2 (two) times daily.  30 tablet  11  . pantoprazole (PROTONIX) 40 MG tablet Take 40 mg by mouth daily.        . sucralfate (CARAFATE) 1 G tablet Take 1 g by mouth 4 (four) times daily.        Marland Kitchen spironolactone (ALDACTONE) 25 MG tablet Take 0.5 tablets (12.5 mg total) by mouth daily.  15 tablet  6  . DISCONTD: carvedilol (COREG) 6.25 MG tablet Take 1 tablet (6.25 mg total) by mouth 2 (two) times daily.  60 tablet  11     Allergies: No Known Allergies   History  Substance Use Topics  . Smoking status: Former Smoker    Types: Cigarettes    Quit  date: 03/04/2011  . Smokeless tobacco: Not on file   Comment: Quit smoking in April 2012. About 60-pack-year history of smoking  . Alcohol Use: No     socially      Family History  Problem Relation Age of Onset  . Lung disease Father       ROS:  Please see the history of present illness.     All other systems reviewed and negative.   Vital Signs: Blood pressure 158/90, pulse 67, height 6' (1.829 m), weight 196 lb (88.905 kg).  PHYSICAL EXAM: General:  Well nourished, well developed male in no acute distress HEENT: normal Lymph: no adenopathy Neck: no JVD Endocrine:  No thryomegaly Vascular: No carotid bruits; FA pulses 2+ bilaterally without bruits Cardiac:  normal S1, S2; RRR; no murmur Back: without kyphosis/scoliosis, no CVA tenderness Lungs:  Decreased breath sounds throughout Abd: soft, nontender, no hepatomegaly Ext: trace edema Musculoskeletal:  No deformities, BUE and BLE  strength normal and equal Skin: warm and dry Neuro:  CNs 2-12 intact, no focal abnormalities noted Psych:  Normal affect   EKG:  Sinus rhythm at 67 Intervals 0.17/0.10/0.41 Axis is leftward at -58 There are Q waves in V1-V5 with deep T-wave inversions  Labs:   Lab Results  Component Value Date   WBC 9.7 12/12/2011   HGB 16.6 12/12/2011   HCT 50.8* 12/12/2011   MCV 77.3* 12/12/2011   PLT 163 12/12/2011   No results found for this basename: NA,K,CL,CO2,BUN,CREATININE,CALCIUM,LABALBU,PROT,BILITOT,ALKPHOS,ALT,AST,GLUCOSE in the last 168 hours No results found for this basename: CKTOTAL:4,CKMB:4,TROPONINI:4 in the last 72 hours Lab Results  Component Value Date   CHOL 106 12/27/2011   HDL 35.30* 12/27/2011   LDLCALC 54 12/27/2011   TRIG 85.0 12/27/2011   No results found for this basename: DDIMER   BNP Pro B Natriuretic peptide (BNP)  Date/Time Value Range Status  07/19/2011  6:15 AM 2531.0* 0-125 (pg/mL) Final  07/15/2011 11:49 AM 7289.0* 0-125 (pg/mL) Final  03/04/2011  4:41 PM 78.0  0.0-100.0 (pg/mL) Final       ASSESSMENT AND PLAN:

## 2012-01-11 NOTE — Interval H&P Note (Signed)
History and Physical Interval Note:  01/11/2012 10:05 AM  Jesus Sanders  has presented today for surgery, with the diagnosis of acm  The various methods of treatment have been discussed with the patient and family. After consideration of risks, benefits and other options for treatment, the patient has consented to  Procedure(s) (LRB): IMPLANTABLE CARDIOVERTER DEFIBRILLATOR IMPLANT (N/A) as a surgical intervention .  The patients' history has been reviewed, patient examined, no change in status, stable for surgery.  I have reviewed the patients' chart and labs.  Questions were answered to the patient's satisfaction.     Sherryl Manges

## 2012-01-11 NOTE — Brief Op Note (Signed)
01/11/2012  11:38 AM  PATIENT:  Jesus Sanders  76 y.o. male  PRE-OPERATIVE DIAGNOSIS: ischemic cardiomyopathy POST-OPERATIVE DIAGNOSIS:  Same   PROCEDURE:  Procedure(s) (LRB): IMPLANTABLE CARDIOVERTER DEFIBRILLATOR IMPLANT (N/A)  SURGEON:  Surgeon(s) and Role:    * Duke Salvia, MD - Primary  PHYSICIAN ASSISTANT:   ASSISTANTS: none   ANESTHESIA:   local and IV sedation   DICTATION: .Other Dictation: Dictation Number [id number]    PATIENT DISPOSITION:  PACU - hemodynamically stable.   Delay start of Pharmacological VTE agent (>24hrs) due to surgical blood loss or risk of bleeding: no

## 2012-01-12 ENCOUNTER — Ambulatory Visit (HOSPITAL_COMMUNITY): Payer: Medicare Other

## 2012-01-12 ENCOUNTER — Other Ambulatory Visit: Payer: Self-pay

## 2012-01-12 DIAGNOSIS — K219 Gastro-esophageal reflux disease without esophagitis: Secondary | ICD-10-CM

## 2012-01-12 DIAGNOSIS — I2589 Other forms of chronic ischemic heart disease: Secondary | ICD-10-CM

## 2012-01-12 MED ORDER — CARVEDILOL 6.25 MG PO TABS: 18.7500 mg | ORAL_TABLET | Freq: Two times a day (BID) | ORAL | Status: DC

## 2012-01-12 NOTE — Discharge Instructions (Signed)
PLEASE REMEMBER TO BRING ALL OF YOUR MEDICATIONS TO EACH OF YOUR FOLLOW-UP OFFICE VISITS.   Supplemental Discharge Instructions for  Defibrillator Patients  Activity No heavy lifting or vigorous activity with your left/right arm for 4 to 6 weeks.  Do not raise your left/right arm above your head for one week.  Gradually raise your affected arm as drawn below.          01/15/12                     01/16/12                 01/17/12                 01/18/12 NO DRIVING for 6 days; you may begin driving on 16/10/96. WOUND CARE   Keep the wound area clean and dry.  Do not get this area wet for one week. No showers for six days; you may shower on       01/18/12.   The tape/steri-strips on your wound will fall off; do not pull them off.  No bandage is needed on the site.  DO  NOT apply any creams, oils, or ointments to the wound area.   If you notice any drainage or discharge from the wound, any swelling or bruising at the site, or you develop a fever > 101? F after you are discharged home, call the office at once.  Special Instructions   You are still able to use cellular telephones; use the ear opposite the side where you have your pacemaker/defibrillator.  Avoid carrying your cellular phone near your device.   When traveling through airports, show security personnel your identification card to avoid being screened in the metal detectors.  Ask the security personnel to use the hand wand.   Avoid arc welding equipment, MRI testing (magnetic resonance imaging), TENS units (transcutaneous nerve stimulators).  Call the office for questions about other devices.   Avoid electrical appliances that are in poor condition or are not properly grounded.   Microwave ovens are safe to be near or to operate.  Additional information for defibrillator patients should your device go off:   If your device goes off ONCE and you feel fine afterward, notify the device clinic nurses.   If your device goes off ONCE and  you do not feel well afterward, call 911.   If your device goes off TWICE, call 911.   If your device goes off THREE times in one day, call 911.  DO NOT DRIVE YOURSELF OR A FAMILY MEMBER WITH A DEFIBRILLATOR TO THE HOSPITAL--CALL 911.

## 2012-01-12 NOTE — Progress Notes (Signed)
  Patient Name: Jesus Sanders      SUBJECTIVE:s/p ICD implantation for 1 prevention in ischemic cardiomyopathy with hypertension  Past Medical History  Diagnosis Date  . Polycythemia vera     [facility]  will restart his phlebotomies from about Mar 15 2011  . Stomach ulcer   . STEMI (ST elevation myocardial infarction) Sept 2012    Larger anterior apical MI with BMS to LAD with EF of 25 to 30%  . Tobacco abuse     stopped in April of 2012  . Duodenal perforation June 2012  . Peritonitis June 2012  . S/P cardiac cath Sept 2012    BMS to LAD  . Pneumonia April 2012  . LV dysfunction Sept 2012    EF is 25%    PHYSICAL EXAM Filed Vitals:   01/11/12 1500 01/11/12 1944 01/11/12 2237 01/12/12 0449  BP: 117/62 132/79 128/92 149/88  Pulse: 66 76 105 76  Temp:  98.1 F (36.7 C) 98 F (36.7 C) 97 F (36.1 C)  TempSrc:  Oral Oral Oral  Resp: 18 18 18 18   Height:      Weight:      SpO2:  93% 98% 90%   Well developed and nourished in no acute distress HENT normal Neck supple with JVP-flat Pocket  Clear Regular rate and rhythm, no murmurs or gallops Abd-soft with active BS No Clubbing cyanosis edema Skin-warm and dry A & Oriented  Grossly normal sensory and motor function  TELEMETRY: Reviewed telemetry pt in [facility]:    Intake/Output Summary (Last 24 hours) at 01/12/12 0802 Last data filed at 01/12/12 0450  Gross per 24 hour  Intake     50 ml  Output    400 ml  Net   -350 ml    LABS:     ASSESSMENT AND PLAN:  Device function normal  HTN-increased  Coreg to 18.75 for discharge Wound check 10-14 days, PJ as scheduled adn SK in 3 months  Signed, Sherryl Manges MD  01/12/2012

## 2012-01-12 NOTE — Op Note (Signed)
NAME:  Jesus, Sanders NO.:  [id number]  MEDICAL RECORD NO.:  [id number]  LOCATION:  2014                         FACILITY:  [facility]  PHYSICIAN:  Duke Salvia, MD, FACCDATE OF BIRTH:  February 19, 1936  DATE OF PROCEDURE:  01/11/2012 DATE OF DISCHARGE:                              OPERATIVE REPORT   PREOPERATIVE DIAGNOSIS:  Ischemic cardiomyopathy.  POSTOPERATIVE DIAGNOSIS:  Ischemic cardiomyopathy.  PROCEDURE:  Single-chamber defibrillator implantation with intraoperative defibrillation threshold testing.  DESCRIPTION:  Following obtaining informed consent, the patient was brought to electrophysiology laboratory and placed on the fluoroscopic table in supine position.  After routine prep and drape of the left upper chest, lidocaine was infiltrated in prepectoral subclavicular region.  Incision was made and carried down to the layer of the prepectoral fascia.  Using electrocautery and sharp dissection, a pocket was formed similarly and hemostasis was obtained.  Thereafter, attention was turned to gain access to the extrathoracic left subclavian vein which was accomplished without difficulty without the aspiration or puncture of the artery.  A single venipuncture was accomplished.  A guidewire was placed and retained, and a 9-French sheath was placed through which was then passed a Medtronic 6935 single coil defibrillator lead, serial #[id number].  Under fluoroscopic guidance, it was noted that the RV apex with a bipolar R-wave was 11.7 with a pacing impedance of 1300 ohms and threshold 0.5 V at 0.5 msec. Current threshold was 0.4 mA.  There was no diaphragmatic pacing at 10 V and the current of injury was brisk.  With these acceptable parameters recorded, the lead was secured to the prepectoral fascia and then attached to a Medtronic D314VRG device.  The bipolar R-wave was 7.1 with a pacing impedance of 570 ohms, a high voltage impedance of 65 ohms, pacing  threshold was 0.5 V at 0.4 msec.  With these acceptable parameters recorded, defibrillation threshold testing was undertaken. Ventricular fibrillation was induced via the T-wave shock.  After a total duration of 7 seconds, a 15 joule shock was delivered through a measured resistance of 64 ohms terminating ventricular fibrillation and restoring sinus rhythm.  The device was implanted.  The pocket was copiously irrigated with antibiotic containing saline solution.  Hemostasis was assured.  Leads and pulse generator were placed in the pocket and secured to the prepectoral fascia, and the wound was closed in 3 layers in normal fashion.  The wound was washed, dried, and benzoin and Steri-Strips dressing was applied.  Needle counts, sponge counts, and instrument counts were correct at the end of the procedure according to the staff.  The patient tolerated the procedure without apparent complication.     Duke Salvia, MD, [facility]     SCK/MEDQ  D:  01/11/2012  T:  01/12/2012  Job:  [id number]

## 2012-01-12 NOTE — Discharge Summary (Signed)
Discharge Summary   Patient ID: Jesus Sanders,  MRN: [id number], DOB/AGE: [birth year] 76 y.o.  Admit date: 01/11/2012 Discharge date: 01/12/2012  Discharge Diagnoses Principal Problem:  *Ischemic cardiomyopathy Active Problems:  HTN (hypertension)  GERD (gastroesophageal reflux disease)   Allergies No Known Allergies  Diagnostic Studies/Procedures  Single-chamber ICD implantation- 01/11/12  OPERATIVE REPORT  PREOPERATIVE DIAGNOSIS: Ischemic cardiomyopathy.  POSTOPERATIVE DIAGNOSIS: Ischemic cardiomyopathy.  PROCEDURE: Single-chamber defibrillator implantation with  intraoperative defibrillation threshold testing.  DESCRIPTION: Following obtaining informed consent, the patient was  brought to electrophysiology laboratory and placed on the fluoroscopic  table in supine position. After routine prep and drape of the left  upper chest, lidocaine was infiltrated in prepectoral subclavicular  region. Incision was made and carried down to the layer of the  prepectoral fascia. Using electrocautery and sharp dissection, a pocket  was formed similarly and hemostasis was obtained.  Thereafter, attention was turned to gain access to the extrathoracic  left subclavian vein which was accomplished without difficulty without  the aspiration or puncture of the artery. A single venipuncture was  accomplished. A guidewire was placed and retained, and a 9-French  sheath was placed through which was then passed a Medtronic 6935 single  coil defibrillator lead, serial #[id number]. Under fluoroscopic  guidance, it was noted that the RV apex with a bipolar R-wave was 11.7  with a pacing impedance of 1300 ohms and threshold 0.5 V at 0.5 msec.  Current threshold was 0.4 mA. There was no diaphragmatic pacing at 10 V  and the current of injury was brisk. With these acceptable parameters  recorded, the lead was secured to the prepectoral fascia and then  attached to a Medtronic D314VRG device. The  bipolar R-wave was 7.1 with  a pacing impedance of 570 ohms, a high voltage impedance of 65 ohms,  pacing threshold was 0.5 V at 0.4 msec. With these acceptable  parameters recorded, defibrillation threshold testing was undertaken.  Ventricular fibrillation was induced via the T-wave shock. After a  total duration of 7 seconds, a 15 joule shock was delivered through a  measured resistance of 64 ohms terminating ventricular fibrillation and  restoring sinus rhythm. The device was implanted.  The pocket was copiously irrigated with antibiotic containing saline  solution. Hemostasis was assured. Leads and pulse generator were  placed in the pocket and secured to the prepectoral fascia, and the  wound was closed in 3  layers in normal fashion. The wound was washed, dried, and benzoin and  Steri-Strips dressing was applied. Needle counts, sponge counts, and  instrument counts were correct at the end of the procedure according to  the staff. The patient tolerated the procedure without apparent  complication.  AP/Lateral CXR- 01/12/12 Comparison: 07/17/2011. CT abdomenpelvis 06/22/2011 and CT chest  04/04/2011.  Findings: Trachea is midline. Heart size normal. Mild left hilar  prominence is unchanged. There is focal airspace opacification in  the right lower lobe, stable. Lungs are emphysematous. Blunting  of the right costophrenic angle appears chronic. Single lead AICD  tip projects over the right ventricle.  IMPRESSION:  1. Right lower lobe air space opacification and volume loss are  unchanged from prior studies and may be due to post infectious  scarring.  2. No acute findings.  History of Present Illness  [name] is a76 yo male with PMHx significant for CAD (STEMI 09/12 s/p BMS-LAD; EF 25-30%) with subsequent ICM, polycythemia vera (s/p chronic phlebotomies in the past), HTN and remote tobacco abuse (quit 04/12)  who was admitted to [facility] hospital for scheduled elective ICD  implantation.   As above, he received a bare-metal stent to his LAD in 07/2011 after experiencing STEMI. EF at that time and on repeat echo was found to be 25-30% despite medical therapy per guidelines. Since that time, he endorsed moderate SOB with inability to climb a flight of start or walk to his daughter's house next door. He reports occasional peripheral edema and trace episodes of PND. He denies syncope or palpitations.   Hospital Course   He was admitted on 03/06. He was informed, consented and agreed to the procedure outlined above. VF was induced via T-wave shock, after 7s, this was successfully terminated after a 15 joule shock was delivered with restoration to NSR. There were no immediate complications with successful placement of a Medtronic D314VRG device. He was transferred to the PACU for post-op recovery and then to telemetry. Device interrogation revealed normal functionality, CXR without evidence of pneumothorax.   Today, he is stable, at baseline and will be discharged home. He was found to be hypertensive this admission, Coreg was increased to 18.75mg  BID and this will be continued outpatient. There were no acute events. He will follow-up with a wound check in 10-14 days, with Dr. Swaziland next month and with Dr. Graciela Husbands in 3 months. These appointments have been scheduled for him. All of this information including activity restrictions has been clearly outlined for him on the AVS.   Discharge Vitals:  Blood pressure 149/88, pulse 76, temperature 97 F (36.1 C), temperature source Oral, resp. rate 18, height 6' (1.829 m), weight 87.091 kg (192 lb), SpO2 90.00%.   Labs:  None  Disposition:  Discharge Orders    Future Appointments: Provider: Department: Dept Phone: Center:   01/16/2012 10:30 AM Gwenith Spitz [facility] [phone] None   01/16/2012 11:00 AM Lowella Dell, MD [facility] [phone] None   01/23/2012 12:00 PM [facility]  [place] [phone] [facility]   02/27/2012 11:15 AM Peter Swaziland, MD [facility] [phone] None   04/17/2012 9:00 AM Duke Salvia, MD [facility] [phone] [facility]     Follow-up Information    Follow up with [facility] on 01/23/2012. (At 12:00 PM for wound check for device. )    Contact information:   [address] [place] [address]       Follow up with Peter Swaziland, MD on 02/27/2012. (At 11:15 AM for regularly scheduled follow-up.)    Contact information:   [address], [address] [place] [address] [phone]       Follow up with Sherryl Manges, MD on 04/17/2012. (At 9:00 AM for EP follow-up. )    Contact information:   [address], [address] [place] [address] [phone]         Discharge Medications:  Medication List  As of 01/12/2012 11:07 AM   CHANGE how you take these medications         carvedilol 6.25 MG tablet   Commonly known as: COREG   Take 3 tablets (18.75 mg total) by mouth 2 (two) times daily with a meal.   What changed: - medication strength - dose - how often to take the med         CONTINUE taking these medications         ALLERGY RELIEF PO      aspirin EC 81 MG tablet  atorvastatin 80 MG tablet   Commonly known as: LIPITOR      furosemide 20 MG tablet   Commonly known as: LASIX      lisinopril 20 MG tablet   Commonly known as: PRINIVIL,ZESTRIL      pantoprazole 40 MG tablet   Commonly known as: PROTONIX      spironolactone 25 MG tablet   Commonly known as: ALDACTONE          Where to get your medications    These are the prescriptions that you need to pick up. We sent them to a specific pharmacy, so you will need to go there to get them.   [id number] [place], [place] - [address]    [address] [place] [address]    Phone: [phone]        carvedilol 6.25 MG tablet             Outstanding Labs/Studies: None  Duration of Discharge Encounter: 40 minutes including physician time.  Signed, R. Hurman Horn, PA-C 01/12/2012, 11:07 AM

## 2012-01-16 ENCOUNTER — Ambulatory Visit (HOSPITAL_BASED_OUTPATIENT_CLINIC_OR_DEPARTMENT_OTHER): Payer: Medicare Other | Admitting: Oncology

## 2012-01-16 ENCOUNTER — Other Ambulatory Visit (HOSPITAL_BASED_OUTPATIENT_CLINIC_OR_DEPARTMENT_OTHER): Payer: Medicare Other | Admitting: Lab

## 2012-01-16 ENCOUNTER — Telehealth: Payer: Self-pay | Admitting: Oncology

## 2012-01-16 VITALS — BP 170/98 | HR 72 | Temp 98.2°F | Ht 72.0 in | Wt 194.3 lb

## 2012-01-16 DIAGNOSIS — D45 Polycythemia vera: Secondary | ICD-10-CM

## 2012-01-16 DIAGNOSIS — F17200 Nicotine dependence, unspecified, uncomplicated: Secondary | ICD-10-CM

## 2012-01-16 LAB — CBC WITH DIFFERENTIAL/PLATELET
BASO%: 0.3 % (ref 0.0–2.0)
HCT: 50 % — ABNORMAL HIGH (ref 38.4–49.9)
LYMPH%: 23.3 % (ref 14.0–49.0)
MCH: 25.2 pg — ABNORMAL LOW (ref 27.2–33.4)
MCHC: 32.4 g/dL (ref 32.0–36.0)
MCV: 77.6 fL — ABNORMAL LOW (ref 79.3–98.0)
MONO#: 0.9 10*3/uL (ref 0.1–0.9)
MONO%: 9.7 % (ref 0.0–14.0)
NEUT%: 63.6 % (ref 39.0–75.0)
Platelets: 140 10*3/uL (ref 140–400)
RBC: 6.44 10*6/uL — ABNORMAL HIGH (ref 4.20–5.82)
nRBC: 0 % (ref 0–0)

## 2012-01-16 NOTE — Progress Notes (Signed)
ID: Jesus Sanders   DOB: [birth year]  MR#: [id number]  OZD#:[id number]  HISTORY OF PRESENT ILLNESS:  INTERVAL HISTORY: Jesus Sanders returns today with his daughter Jesus Sanders for followup of his polycythemia vera. The interval history has been dominated by cardiac problems which are separately documented in the p.m. R. Most recently he had a defibrillator placed for his significant congestive heart failure.  REVIEW OF SYSTEMS: Unfortunately he continues to smoke. We discussed this at length today. He is go down to a half a pack a day". He knows that this is contributing to his CAD and that it also raises his hemoglobin level. He knows that he would feel considerably better if he managed to quit. This is not a prospect for him however. He just doesn't see how it could be done. He continues to have shortness of breath, some of orthopnea, difficulty walking up stairs, but no significant cough, phlegm production, or chest pain or pressure. A detailed review of systems was otherwise stable.  PAST MEDICAL HISTORY: Past Medical History  Diagnosis Date  . Polycythemia vera     [facility]  will restart his phlebotomies from about Mar 15 2011  . Stomach ulcer   . STEMI (ST elevation myocardial infarction) Sept 2012    Larger anterior apical MI with BMS to LAD with EF of 25 to 30%  . Tobacco abuse     stopped in April of 2012  . Duodenal perforation June 2012  . Peritonitis June 2012  . S/P cardiac cath Sept 2012    BMS to LAD  . Pneumonia April 2012  . LV dysfunction Sept 2012    EF is 25%    PAST SURGICAL HISTORY: Past Surgical History  Procedure Date  . Cholecystectomy 03/2011    Dr. Carolynne Edouard  . Colon surgery   . Cardiac catheterization Sept 2012    Normal left main, occluded LAD, 40% LCX and normal RCA. EF is 30%  . US echocardiography Sept 2012    EF 25 to 30% with akinesis of the mid to distal anterior and apical myocardium, trivial AI  and no apical thrombus    FAMILY HISTORY Family History  Problem Relation Age of Onset  . Lung disease Father     SOCIAL HISTORY:    ADVANCED DIRECTIVES: Living will in place  HEALTH MAINTENANCE: History  Substance Use Topics  . Smoking status: Former Smoker    Types: Cigarettes    Quit date: 03/04/2011  . Smokeless tobacco: Not on file   Comment: Quit smoking in April 2012. About 60-pack-year history of smoking  . Alcohol Use: No     socially   No Known Allergies  Current Outpatient Prescriptions  Medication Sig Dispense Refill  . aspirin EC 81 MG tablet Take 81 mg by mouth daily.      Marland Kitchen atorvastatin (LIPITOR) 80 MG tablet Take 80 mg by mouth daily.        . carvedilol (COREG) 6.25 MG tablet Take 3 tablets (18.75 mg total) by mouth 2 (two) times daily with a meal.  180 tablet  3  . Chlorpheniramine Maleate (ALLERGY RELIEF PO) Take 1 tablet by mouth daily.       . furosemide (LASIX) 20 MG tablet Take 20 mg by mouth daily as needed. Use as needed for swelling or for weight gain of 2 to 3 pounds overnight.      Marland Kitchen lisinopril (PRINIVIL,ZESTRIL) 20 MG tablet Take 20  mg by mouth 2 (two) times daily.      . pantoprazole (PROTONIX) 40 MG tablet Take 40 mg by mouth daily.        Marland Kitchen spironolactone (ALDACTONE) 25 MG tablet Take 12.5 mg by mouth daily.      Marland Kitchen DISCONTD: carvedilol (COREG) 6.25 MG tablet Take 1 tablet (6.25 mg total) by mouth 2 (two) times daily.  60 tablet  11    OBJECTIVE: Elderly white male who smells of tobacco. Filed Vitals:   01/16/12 1035  BP: 170/98  Pulse: 72  Temp: 98.2 F (36.8 Sanders)     Body mass index is 26.35 kg/(m^2).    ECOG FS: 2  Sclerae unicteric Oropharynx clear No peripheral adenopathy Lungs show bilateral wheezes, no crackles Heart regular rate and rhythm, no murmur appreciated Abd benign MSK no focal spinal tenderness, no peripheral edema Neuro: nonfocal  LAB RESULTS: Lab Results  Component Value Date   WBC 9.6 01/04/2012   NEUTROABS  6.4 01/04/2012   HGB 15.6 01/04/2012   HCT 48.8 01/04/2012   MCV 78.3 01/04/2012   PLT 153.0 01/04/2012      Chemistry      Component Value Date/Time   NA 139 01/04/2012 1213   K 4.2 01/04/2012 1213   CL 105 01/04/2012 1213   CO2 29 01/04/2012 1213   BUN 17 01/04/2012 1213   CREATININE 0.9 01/04/2012 1213      Component Value Date/Time   CALCIUM 9.4 01/04/2012 1213   ALKPHOS 167* 06/22/2011 0943   AST 57* 06/22/2011 0943   ALT 104* 06/22/2011 0943   BILITOT 0.4 06/22/2011 0943       No results found for this basename: LABCA2    No results found for this basename: INR:1;PROTIME:1 in the last 168 hours  No results found for this basename: UACOL:1,UAPR:1,USPG:1,UPH:1,UTP:1,UGL:1,UKET:1,UBIL:1,UHGB:1,UNIT:1,UROB:1,ULEU:1,UEPI:1,UWBC:1,URBC:1,UBAC:1,CAST:1,CRYS:1,UCOM:1,BILUA:1 in the last 72 hours   STUDIES: Dg Chest 2 View  01/12/2012  *RADIOLOGY REPORT*  Clinical Data: Shortness of breath.  CHEST - 2 VIEW  Comparison: 07/17/2011.  CT abdomenpelvis 06/22/2011 and CT chest 04/04/2011.  Findings: Trachea is midline.  Heart size normal.  Mild left hilar prominence is unchanged.  There is focal airspace opacification in the right lower lobe, stable.  Lungs are emphysematous.  Blunting of the right costophrenic angle appears chronic.  Single lead AICD tip projects over the right ventricle.  IMPRESSION:  1.  Right lower lobe air space opacification and volume loss are unchanged from prior studies and may be due to post infectious scarring. 2.  No acute findings.  Original Report Authenticated By: Jesus Sanders, M.D.    ASSESSMENT: 76 year old [place] man with a history of polycythemia vera treated with phlebotomy on a q.2 week basis, last phlebotomized in July 2012.  The goal is to phlebotomize on a q.2 week basis once Jesus Sanders hematocrit is 50 or greater.  At that point, we phlebotomize until the hematocrit is at 40 or less.  Several comorbidities including community-acquired pneumonia  involving the right lower lobe, history of recurrent deep vein thrombosis, history of tobacco abuse with chronic bronchitis, history of CVA and more recently history of STEMI and CHF, s/p defibrillator placement   PLAN: We will continue to follow his CBC on a monthly basis if and when the HCT goes over 50 we will proceed to phlebotomy; we would then phlebotomize him weekly to a hematocrit around 40. Otherwise he will see me again in one year  I have again discussed with him the advisability  of quitting smoking. It is unfortunate that he is not able to accomplish this. He certainly understands its importance. He does have a living will in place. Jesus Sanders    01/16/2012

## 2012-01-16 NOTE — Telephone Encounter (Signed)
S/w the pt regarding his April lab appt and to stop by the scheduling desk to pick up the rest of his appt calendars at that time

## 2012-01-23 ENCOUNTER — Ambulatory Visit (INDEPENDENT_AMBULATORY_CARE_PROVIDER_SITE_OTHER): Payer: Medicare Other | Admitting: *Deleted

## 2012-01-23 ENCOUNTER — Encounter: Payer: Self-pay | Admitting: Internal Medicine

## 2012-01-23 DIAGNOSIS — I428 Other cardiomyopathies: Secondary | ICD-10-CM

## 2012-01-23 LAB — ICD DEVICE OBSERVATION
CHARGE TIME: 2.602 s
HV IMPEDENCE: 60 Ohm
RV LEAD AMPLITUDE: 7.625 mv
RV LEAD IMPEDENCE ICD: 513 Ohm
RV LEAD THRESHOLD: 0.5 V
TOT-0001: 1
TOT-0002: 0
TZAT-0001FASTVT: 1
TZAT-0002FASTVT: NEGATIVE
TZAT-0020SLOWVT: 1.5 ms
TZON-0003SLOWVT: 360 ms
TZON-0003VSLOWVT: 360 ms
TZON-0004SLOWVT: 16
TZON-0005SLOWVT: 12
TZST-0001FASTVT: 2
TZST-0001FASTVT: 4
TZST-0001SLOWVT: 2
TZST-0001SLOWVT: 3
TZST-0002FASTVT: NEGATIVE
TZST-0002FASTVT: NEGATIVE
TZST-0002SLOWVT: NEGATIVE
TZST-0002SLOWVT: NEGATIVE
TZST-0002SLOWVT: NEGATIVE
VF: 0

## 2012-01-23 NOTE — Progress Notes (Signed)
Wound check defib in clinic  

## 2012-02-13 ENCOUNTER — Other Ambulatory Visit (HOSPITAL_BASED_OUTPATIENT_CLINIC_OR_DEPARTMENT_OTHER): Payer: Medicare Other | Admitting: Lab

## 2012-02-13 DIAGNOSIS — D45 Polycythemia vera: Secondary | ICD-10-CM

## 2012-02-13 LAB — CBC WITH DIFFERENTIAL/PLATELET
BASO%: 0.2 % (ref 0.0–2.0)
LYMPH%: 21.3 % (ref 14.0–49.0)
MCHC: 32.6 g/dL (ref 32.0–36.0)
MONO#: 0.9 10*3/uL (ref 0.1–0.9)
MONO%: 9.7 % (ref 0.0–14.0)
Platelets: 139 10*3/uL — ABNORMAL LOW (ref 140–400)
RBC: 6.46 10*6/uL — ABNORMAL HIGH (ref 4.20–5.82)
WBC: 9.2 10*3/uL (ref 4.0–10.3)
nRBC: 0 % (ref 0–0)

## 2012-02-21 ENCOUNTER — Other Ambulatory Visit: Payer: Self-pay | Admitting: Cardiovascular Disease

## 2012-02-27 ENCOUNTER — Other Ambulatory Visit: Payer: Medicare Other | Admitting: *Deleted

## 2012-02-27 ENCOUNTER — Ambulatory Visit (INDEPENDENT_AMBULATORY_CARE_PROVIDER_SITE_OTHER): Payer: Medicare Other | Admitting: Cardiology

## 2012-02-27 ENCOUNTER — Encounter: Payer: Self-pay | Admitting: Cardiology

## 2012-02-27 VITALS — BP 124/68 | HR 80 | Ht 72.0 in | Wt 191.0 lb

## 2012-02-27 DIAGNOSIS — D45 Polycythemia vera: Secondary | ICD-10-CM

## 2012-02-27 DIAGNOSIS — I5022 Chronic systolic (congestive) heart failure: Secondary | ICD-10-CM

## 2012-02-27 DIAGNOSIS — Z4502 Encounter for adjustment and management of automatic implantable cardiac defibrillator: Secondary | ICD-10-CM

## 2012-02-27 DIAGNOSIS — I2510 Atherosclerotic heart disease of native coronary artery without angina pectoris: Secondary | ICD-10-CM

## 2012-02-27 DIAGNOSIS — D751 Secondary polycythemia: Secondary | ICD-10-CM

## 2012-02-27 DIAGNOSIS — I509 Heart failure, unspecified: Secondary | ICD-10-CM

## 2012-02-27 DIAGNOSIS — E785 Hyperlipidemia, unspecified: Secondary | ICD-10-CM | POA: Insufficient documentation

## 2012-02-27 MED ORDER — CARVEDILOL 25 MG PO TABS: 25.0000 mg | ORAL_TABLET | Freq: Two times a day (BID) | ORAL | Status: DC

## 2012-02-27 NOTE — Progress Notes (Signed)
Jesus Sanders Date of Birth: 07-Mar-1936 Medical Record #[id number]  History of Present Illness: Jesus Sanders is seen back today for a followup visit.  He had a larger anterior apical MI with BMS to the LAD in September. He was felt to have presented late. EF is 25%. He underwent placement of a prophylactic ICD in early March. He denies any chest pain. He does get short of breath with exertion. He is very sedentary. He complains of he coughs a lot of phlegm up. He denies any current cigarette use. He states he quit smoking over a year ago. He does use a lot of smokeless tobacco.  Current Outpatient Prescriptions on File Prior to Visit  Medication Sig Dispense Refill  . aspirin EC 81 MG tablet Take 81 mg by mouth daily.      Marland Kitchen atorvastatin (LIPITOR) 80 MG tablet TAKE 1 TABLET AT BEDTIME  30 tablet  6  . Chlorpheniramine Maleate (ALLERGY RELIEF PO) Take 1 tablet by mouth daily.       . furosemide (LASIX) 20 MG tablet Take 20 mg by mouth daily as needed. Use as needed for swelling or for weight gain of 2 to 3 pounds overnight.      Marland Kitchen lisinopril (PRINIVIL,ZESTRIL) 20 MG tablet Take 20 mg by mouth 2 (two) times daily.      . pantoprazole (PROTONIX) 40 MG tablet Take 40 mg by mouth daily.        Marland Kitchen spironolactone (ALDACTONE) 25 MG tablet Take 12.5 mg by mouth daily.      Marland Kitchen DISCONTD: lisinopril (PRINIVIL,ZESTRIL) 10 MG tablet Take 1 tablet (10 mg total) by mouth 2 (two) times daily.  60 tablet  11    No Known Allergies  Past Medical History  Diagnosis Date  . Polycythemia vera     [facility]  will restart his phlebotomies from about Mar 15 2011  . Stomach ulcer   . STEMI (ST elevation myocardial infarction) Sept 2012    Larger anterior apical MI with BMS to LAD with EF of 25 to 30%  . Tobacco abuse     stopped in April of 2012  . Duodenal perforation June 2012  . Peritonitis June 2012  . S/P cardiac cath Sept 2012    BMS to LAD    . Pneumonia April 2012  . LV dysfunction Sept 2012    EF is 25%  . Systolic CHF, chronic     Past Surgical History  Procedure Date  . Cholecystectomy 03/2011    Dr. Carolynne Edouard  . Colon surgery   . Cardiac catheterization Sept 2012    Normal left main, occluded LAD, 40% LCX and normal RCA. EF is 30%  . US echocardiography Sept 2012    EF 25 to 30% with akinesis of the mid to distal anterior and apical myocardium, trivial AI and no apical thrombus    History  Smoking status  . Former Smoker  . Types: Cigarettes  . Quit date: 03/04/2011  Smokeless tobacco  . Not on file  Comment: Quit smoking in April 2012. About 60-pack-year history of smoking    History  Alcohol Use No    socially    Family History  Problem Relation Age of Onset  . Lung disease Father     Review of Systems: The review of systems is positive for periodic phlebotomy for polycythemia. His last hematocrit was 50.3. Goal is to keep his hematocrit a  low 50..  All other systems were reviewed and are negative.  Physical Exam: BP 124/68  Pulse 80  Ht 6' (1.829 m)  Wt 191 lb (86.637 kg)  BMI 25.90 kg/m2 Patient is  pleasant and in no acute distress. Skin is warm and dry. Color is normal.  HEENT is unremarkable. Normocephalic/atraumatic. PERRL. Sclera are nonicteric. Neck is supple. No masses. No JVD. Lungs are coarse with some faint wheezes. Cardiac exam shows a regular rate and rhythm. Abdomen is soft. Extremities are without edema. Gait and ROM are intact. No gross neurologic deficits noted.  LABORATORY DATA: Echocardiogram from February was reviewed. This demonstrated an ejection fraction of 25-30% with akinesis of the apex, mid to distal anterior septum, and mid to distal inferior wall. There was mild mitral insufficiency. Lab Results  Component Value Date   CHOL 106 12/27/2011   HDL 35.30* 12/27/2011   LDLCALC 54 12/27/2011   TRIG 85.0 12/27/2011   CHOLHDL 3 12/27/2011     Assessment / Plan:

## 2012-02-27 NOTE — Assessment & Plan Note (Signed)
He denies any recurrent anginal symptoms. We will continue with aspirin and beta blocker therapy. I've encouraged complete tobacco cessation.

## 2012-02-27 NOTE — Patient Instructions (Addendum)
We will change carvedilol (Coreg) to 25 mg twice a day.  Continue your other therapy.  I will see you again in 4 months.  You need to walk daily for exercise.

## 2012-02-27 NOTE — Assessment & Plan Note (Signed)
He appears to be euvolemic today. We will increase his carvedilol to 25 mg twice daily. He is also on lisinopril and Aldactone. He takes Lasix as needed. I recommended sodium restriction. I have encouraged him to begin a regular walking program. I do not feel that his dyspnea will improve until he becomes more active than builds up his conditioning. I will followup again in 4 months.

## 2012-02-27 NOTE — Assessment & Plan Note (Signed)
Lipid levels are at goal except for low HDL. We will continue with Lipitor.

## 2012-03-12 ENCOUNTER — Other Ambulatory Visit (HOSPITAL_BASED_OUTPATIENT_CLINIC_OR_DEPARTMENT_OTHER): Payer: Medicare Other | Admitting: Lab

## 2012-03-12 DIAGNOSIS — D45 Polycythemia vera: Secondary | ICD-10-CM

## 2012-03-12 LAB — CBC WITH DIFFERENTIAL/PLATELET
BASO%: 0.2 % (ref 0.0–2.0)
EOS%: 3 % (ref 0.0–7.0)
HGB: 16 g/dL (ref 13.0–17.1)
MCH: 25 pg — ABNORMAL LOW (ref 27.2–33.4)
MCHC: 32.9 g/dL (ref 32.0–36.0)
RDW: 18.3 % — ABNORMAL HIGH (ref 11.0–14.6)
lymph#: 1.8 10*3/uL (ref 0.9–3.3)
nRBC: 0 % (ref 0–0)

## 2012-03-26 ENCOUNTER — Encounter (HOSPITAL_COMMUNITY): Payer: Self-pay | Admitting: *Deleted

## 2012-03-26 ENCOUNTER — Emergency Department (HOSPITAL_COMMUNITY): Payer: Medicare Other

## 2012-03-26 ENCOUNTER — Inpatient Hospital Stay (HOSPITAL_COMMUNITY)
Admission: EM | Admit: 2012-03-26 | Discharge: 2012-03-29 | DRG: 193 | Disposition: A | Discharge status: Home / Self Care | Payer: Medicare Other | Source: Ambulatory Visit | Attending: Internal Medicine | Admitting: Internal Medicine

## 2012-03-26 DIAGNOSIS — E785 Hyperlipidemia, unspecified: Secondary | ICD-10-CM | POA: Diagnosis present

## 2012-03-26 DIAGNOSIS — I252 Old myocardial infarction: Secondary | ICD-10-CM

## 2012-03-26 DIAGNOSIS — J189 Pneumonia, unspecified organism: Principal | ICD-10-CM | POA: Diagnosis present

## 2012-03-26 DIAGNOSIS — I255 Ischemic cardiomyopathy: Secondary | ICD-10-CM | POA: Diagnosis present

## 2012-03-26 DIAGNOSIS — J441 Chronic obstructive pulmonary disease with (acute) exacerbation: Secondary | ICD-10-CM | POA: Diagnosis present

## 2012-03-26 DIAGNOSIS — R238 Other skin changes: Secondary | ICD-10-CM | POA: Diagnosis present

## 2012-03-26 DIAGNOSIS — I2510 Atherosclerotic heart disease of native coronary artery without angina pectoris: Secondary | ICD-10-CM | POA: Diagnosis present

## 2012-03-26 DIAGNOSIS — J9620 Acute and chronic respiratory failure, unspecified whether with hypoxia or hypercapnia: Secondary | ICD-10-CM | POA: Diagnosis present

## 2012-03-26 DIAGNOSIS — D45 Polycythemia vera: Secondary | ICD-10-CM | POA: Diagnosis present

## 2012-03-26 DIAGNOSIS — R233 Spontaneous ecchymoses: Secondary | ICD-10-CM | POA: Diagnosis present

## 2012-03-26 DIAGNOSIS — D72829 Elevated white blood cell count, unspecified: Secondary | ICD-10-CM | POA: Diagnosis present

## 2012-03-26 DIAGNOSIS — Z95810 Presence of automatic (implantable) cardiac defibrillator: Secondary | ICD-10-CM

## 2012-03-26 DIAGNOSIS — I5022 Chronic systolic (congestive) heart failure: Secondary | ICD-10-CM | POA: Diagnosis present

## 2012-03-26 DIAGNOSIS — Z9861 Coronary angioplasty status: Secondary | ICD-10-CM

## 2012-03-26 DIAGNOSIS — N179 Acute kidney failure, unspecified: Secondary | ICD-10-CM | POA: Diagnosis present

## 2012-03-26 DIAGNOSIS — F17200 Nicotine dependence, unspecified, uncomplicated: Secondary | ICD-10-CM | POA: Diagnosis present

## 2012-03-26 DIAGNOSIS — I10 Essential (primary) hypertension: Secondary | ICD-10-CM | POA: Diagnosis present

## 2012-03-26 DIAGNOSIS — R5381 Other malaise: Secondary | ICD-10-CM | POA: Diagnosis present

## 2012-03-26 DIAGNOSIS — K219 Gastro-esophageal reflux disease without esophagitis: Secondary | ICD-10-CM | POA: Diagnosis present

## 2012-03-26 DIAGNOSIS — J42 Unspecified chronic bronchitis: Secondary | ICD-10-CM | POA: Diagnosis present

## 2012-03-26 DIAGNOSIS — I509 Heart failure, unspecified: Secondary | ICD-10-CM | POA: Diagnosis present

## 2012-03-26 HISTORY — DX: Ischemic cardiomyopathy: I25.5

## 2012-03-26 HISTORY — DX: Gastro-esophageal reflux disease without esophagitis: K21.9

## 2012-03-26 HISTORY — DX: Personal history of other venous thrombosis and embolism: Z86.718

## 2012-03-26 HISTORY — DX: Essential (primary) hypertension: I10

## 2012-03-26 LAB — DIFFERENTIAL
Lymphocytes Relative: 8 % — ABNORMAL LOW (ref 12–46)
Lymphs Abs: 1 10*3/uL (ref 0.7–4.0)
Neutro Abs: 10 10*3/uL — ABNORMAL HIGH (ref 1.7–7.7)
Neutrophils Relative %: 79 % — ABNORMAL HIGH (ref 43–77)

## 2012-03-26 LAB — CBC
Hemoglobin: 15.6 g/dL (ref 13.0–17.0)
Platelets: 126 10*3/uL — ABNORMAL LOW (ref 150–400)
RBC: 6.18 MIL/uL — ABNORMAL HIGH (ref 4.22–5.81)
WBC: 12.6 10*3/uL — ABNORMAL HIGH (ref 4.0–10.5)

## 2012-03-26 LAB — POCT I-STAT TROPONIN I: Troponin i, poc: 0 ng/mL (ref 0.00–0.08)

## 2012-03-26 LAB — POCT I-STAT, CHEM 8
BUN: 24 mg/dL — ABNORMAL HIGH (ref 6–23)
Chloride: 104 mEq/L (ref 96–112)
Potassium: 4.5 mEq/L (ref 3.5–5.1)
Sodium: 140 mEq/L (ref 135–145)
TCO2: 29 mmol/L (ref 0–100)

## 2012-03-26 LAB — PRO B NATRIURETIC PEPTIDE: Pro B Natriuretic peptide (BNP): 924.4 pg/mL — ABNORMAL HIGH (ref 0–125)

## 2012-03-26 MED ORDER — DEXTROSE 5 % IV SOLN
500.0000 mg | Freq: Once | INTRAVENOUS | Status: DC
  Administered 2012-03-27: 500 mg via INTRAVENOUS
  Filled 2012-03-26: qty 500

## 2012-03-26 MED ORDER — ASPIRIN 81 MG PO CHEW
324.0000 mg | CHEWABLE_TABLET | Freq: Once | ORAL | Status: AC
  Administered 2012-03-26: 324 mg via ORAL
  Filled 2012-03-26: qty 4

## 2012-03-26 MED ORDER — DEXTROSE 5 % IV SOLN
1.0000 g | INTRAVENOUS | Status: DC
  Administered 2012-03-26 – 2012-03-28 (×3): 1 g via INTRAVENOUS
  Filled 2012-03-26 (×3): qty 10

## 2012-03-26 MED ORDER — ALBUTEROL (5 MG/ML) CONTINUOUS INHALATION SOLN: 15.0000 mg | INHALATION_SOLUTION | RESPIRATORY_TRACT | Status: DC

## 2012-03-26 MED ORDER — ALBUTEROL SULFATE (5 MG/ML) 0.5% IN NEBU
5.0000 mg | INHALATION_SOLUTION | Freq: Once | RESPIRATORY_TRACT | Status: AC
  Administered 2012-03-26: 5 mg via RESPIRATORY_TRACT
  Filled 2012-03-26: qty 1

## 2012-03-26 MED ORDER — ALBUTEROL (5 MG/ML) CONTINUOUS INHALATION SOLN
INHALATION_SOLUTION | RESPIRATORY_TRACT | Status: AC
  Filled 2012-03-26: qty 20

## 2012-03-26 NOTE — ED Provider Notes (Signed)
History     CSN: [id number]  Arrival date & time 03/26/12  2110   First MD Initiated Contact with Patient 03/26/12 2123      Chief Complaint  Patient presents with  . Shortness of Breath    (Consider location/radiation/quality/duration/timing/severity/associated sxs/prior treatment) HPI Pt has had 2-3 days of increasing SOB, cough productive of white sputum. Location is chest. Symptoms have been more persistent than typical. Denies any associated chest pain or fever. He has not had any peripheral edema or weight gain. Has history of MI and CHF with EF 25%, s/p AICD. He is a smoker (cigars), has history of pneumonia, but no documented history of COPD. Given albuterol enroute with some improvement.   Past Medical History  Diagnosis Date  . Polycythemia vera     [facility]  will restart his phlebotomies from about Mar 15 2011  . Stomach ulcer   . STEMI (ST elevation myocardial infarction) Sept 2012    Larger anterior apical MI with BMS to LAD with EF of 25 to 30%  . Tobacco abuse     stopped in April of 2012  . Duodenal perforation June 2012  . Peritonitis June 2012  . S/P cardiac cath Sept 2012    BMS to LAD  . Pneumonia April 2012  . LV dysfunction Sept 2012    EF is 25%  . Systolic CHF, chronic     Past Surgical History  Procedure Date  . Cholecystectomy 03/2011    Dr. Carolynne Edouard  . Colon surgery   . Cardiac catheterization Sept 2012    Normal left main, occluded LAD, 40% LCX and normal RCA. EF is 30%  . US echocardiography Sept 2012    EF 25 to 30% with akinesis of the mid to distal anterior and apical myocardium, trivial AI and no apical thrombus  . Cardiac defibrillator placement     Family History  Problem Relation Age of Onset  . Lung disease Father     History  Substance Use Topics  . Smoking status: Passive Smoker    Types: Cigarettes    Last Attempt to Quit: 03/04/2011  . Smokeless tobacco:  Not on file   Comment: Quit smoking in April 2012. About 60-pack-year history of smoking  . Alcohol Use: No     socially      Review of Systems All other systems reviewed and are negative except as noted in HPI.   Allergies  Review of patient's allergies indicates no known allergies.  Home Medications   Current Outpatient Rx  Name Route Sig Dispense Refill  . ASPIRIN EC 81 MG PO TBEC Oral Take 81 mg by mouth daily.    . ATORVASTATIN CALCIUM 80 MG PO TABS  TAKE 1 TABLET AT BEDTIME 30 tablet 6  . CARVEDILOL 25 MG PO TABS Oral Take 1 tablet (25 mg total) by mouth 2 (two) times daily. 60 tablet 11  . ALLERGY RELIEF PO Oral Take 1 tablet by mouth daily.     . FUROSEMIDE 20 MG PO TABS Oral Take 20 mg by mouth daily as needed. Use as needed for swelling or for weight gain of 2 to 3 pounds overnight.    Marland Kitchen LISINOPRIL 20 MG PO TABS Oral Take 20 mg by mouth 2 (two) times daily.    Marland Kitchen PANTOPRAZOLE SODIUM 40 MG PO TBEC Oral Take 40 mg by mouth daily.      Marland Kitchen SPIRONOLACTONE 25 MG PO  TABS Oral Take 12.5 mg by mouth daily.      BP 123/100  Pulse 74  Temp(Src) 98.6 F (37 C) (Oral)  Resp 28  SpO2 92%  Physical Exam  Nursing note and vitals reviewed. Constitutional: He is oriented to person, place, and time. He appears well-developed and well-nourished.  HENT:  Head: Normocephalic and atraumatic.  Eyes: EOM are normal. Pupils are equal, round, and reactive to light.  Neck: Normal range of motion. Neck supple.  Cardiovascular: Normal rate, normal heart sounds and intact distal pulses.   Pulmonary/Chest: Effort normal. He has wheezes. He has rales.       Decreased air movement bilaterally  Abdominal: Bowel sounds are normal. He exhibits no distension. There is no tenderness.  Musculoskeletal: Normal range of motion. He exhibits no edema and no tenderness.  Neurological: He is alert and oriented to person, place, and time. He has normal strength. No cranial nerve deficit or sensory deficit.    Skin: Skin is warm and dry. No rash noted.  Psychiatric: He has a normal mood and affect.    ED Course  Procedures (including critical care time)  Labs Reviewed  CBC - Abnormal; Notable for the following:    WBC 12.6 (*)    RBC 6.18 (*)    MCH 25.2 (*)    RDW 18.5 (*)    Platelets 126 (*)    All other components within normal limits  DIFFERENTIAL - Abnormal; Notable for the following:    Neutrophils Relative 79 (*)    Neutro Abs 10.0 (*)    Lymphocytes Relative 8 (*)    Monocytes Absolute 1.5 (*)    All other components within normal limits  PRO B NATRIURETIC PEPTIDE - Abnormal; Notable for the following:    Pro B Natriuretic peptide (BNP) 924.4 (*)    All other components within normal limits  POCT I-STAT, CHEM 8 - Abnormal; Notable for the following:    BUN 24 (*)    Hemoglobin 17.3 (*)    All other components within normal limits  POCT I-STAT TROPONIN I   No results found.   No diagnosis found.    MDM   Date: 03/26/2012  Rate: 78  Rhythm: normal sinus rhythm  QRS Axis: left  Intervals: normal  ST/T Wave abnormalities: nonspecific ST/T changes  Conduction Disutrbances:left anterior fascicular block  Narrative Interpretation: Abnormal ST segments in V3-4 improved since prior EKG in Mar 2013  Old EKG Reviewed: changes noted  Abnormal EKG concerning for MI, however he is not having chest pain and his symptoms have been ongoing for more than 24hrs so not a candidate for Code STEMI. Will start continuous neb for decreased movement.      10:27 PM Troponin and BNP unremarkable. Awaiting CXR.   11:15 PM CXR shows RLL infiltrate by my interpretation, awaiting official radiologist read. Will admit for CAP, ABx ordered, do not anticipate ICU admission.     Landyn Buckalew B. Bernette Mayers, MD 03/26/12 2315

## 2012-03-26 NOTE — ED Notes (Signed)
Patient has been experiencing SOB all day and today it became worse.  Patient has been coughing white phlegm.  Patient has diminished expiratory and was given 5mg  of albuterol pta.  Patient is alert and oriented x 3.  No other complaints

## 2012-03-27 ENCOUNTER — Encounter (HOSPITAL_COMMUNITY): Payer: Self-pay | Admitting: Internal Medicine

## 2012-03-27 DIAGNOSIS — J189 Pneumonia, unspecified organism: Principal | ICD-10-CM

## 2012-03-27 LAB — CBC
Hemoglobin: 14.8 g/dL (ref 13.0–17.0)
MCH: 25.2 pg — ABNORMAL LOW (ref 26.0–34.0)
MCHC: 32 g/dL (ref 30.0–36.0)
MCV: 78.9 fL (ref 78.0–100.0)

## 2012-03-27 LAB — BASIC METABOLIC PANEL
BUN: 21 mg/dL (ref 6–23)
CO2: 26 mEq/L (ref 19–32)
Calcium: 9.2 mg/dL (ref 8.4–10.5)
GFR calc non Af Amer: 85 mL/min — ABNORMAL LOW (ref >90)
Glucose, Bld: 99 mg/dL (ref 70–99)
Potassium: 4.6 mEq/L (ref 3.5–5.1)

## 2012-03-27 LAB — COMPREHENSIVE METABOLIC PANEL
CO2: 30 mEq/L (ref 19–32)
Calcium: 9.5 mg/dL (ref 8.4–10.5)
Creatinine, Ser: 0.99 mg/dL (ref 0.50–1.35)
GFR calc Af Amer: >90 mL/min (ref >90)
GFR calc non Af Amer: 79 mL/min — ABNORMAL LOW (ref >90)
Glucose, Bld: 97 mg/dL (ref 70–99)
Total Bilirubin: 0.5 mg/dL (ref 0.3–1.2)

## 2012-03-27 MED ORDER — ENOXAPARIN SODIUM 40 MG/0.4ML ~~LOC~~ SOLN
40.0000 mg | SUBCUTANEOUS | Status: DC
  Administered 2012-03-27 – 2012-03-29 (×3): 40 mg via SUBCUTANEOUS
  Filled 2012-03-27 (×3): qty 0.4

## 2012-03-27 MED ORDER — IPRATROPIUM BROMIDE 0.02 % IN SOLN
0.5000 mg | Freq: Four times a day (QID) | RESPIRATORY_TRACT | Status: DC
  Administered 2012-03-28 (×2): 0.5 mg via RESPIRATORY_TRACT
  Filled 2012-03-27 (×2): qty 2.5

## 2012-03-27 MED ORDER — DEXTROSE 5 % IV SOLN
500.0000 mg | Freq: Every day | INTRAVENOUS | Status: DC
  Administered 2012-03-27 – 2012-03-28 (×2): 500 mg via INTRAVENOUS
  Filled 2012-03-27 (×3): qty 500

## 2012-03-27 MED ORDER — SODIUM CHLORIDE 0.9 % IJ SOLN: 3.0000 mL | INTRAMUSCULAR | Status: DC | PRN

## 2012-03-27 MED ORDER — ALBUTEROL SULFATE (5 MG/ML) 0.5% IN NEBU
2.5000 mg | INHALATION_SOLUTION | RESPIRATORY_TRACT | Status: DC
  Administered 2012-03-27: 2.5 mg via RESPIRATORY_TRACT
  Filled 2012-03-27: qty 0.5

## 2012-03-27 MED ORDER — SODIUM CHLORIDE 0.9 % IJ SOLN
3.0000 mL | Freq: Two times a day (BID) | INTRAMUSCULAR | Status: DC
  Administered 2012-03-27 – 2012-03-28 (×4): 3 mL via INTRAVENOUS

## 2012-03-27 MED ORDER — ASPIRIN EC 81 MG PO TBEC
81.0000 mg | DELAYED_RELEASE_TABLET | Freq: Every day | ORAL | Status: DC
Start: 2012-03-27 — End: 2012-03-29
  Administered 2012-03-27 – 2012-03-29 (×3): 81 mg via ORAL
  Filled 2012-03-27 (×3): qty 1

## 2012-03-27 MED ORDER — PANTOPRAZOLE SODIUM 40 MG PO TBEC
40.0000 mg | DELAYED_RELEASE_TABLET | Freq: Every day | ORAL | Status: DC
  Administered 2012-03-27 – 2012-03-29 (×3): 40 mg via ORAL
  Filled 2012-03-27 (×2): qty 1

## 2012-03-27 MED ORDER — ONDANSETRON HCL 4 MG PO TABS: 4.0000 mg | ORAL_TABLET | Freq: Four times a day (QID) | ORAL | Status: DC | PRN

## 2012-03-27 MED ORDER — METHYLPREDNISOLONE SODIUM SUCC 125 MG IJ SOLR
60.0000 mg | Freq: Two times a day (BID) | INTRAMUSCULAR | Status: DC
  Administered 2012-03-27 – 2012-03-29 (×5): 60 mg via INTRAVENOUS
  Filled 2012-03-27: qty 2
  Filled 2012-03-27 (×6): qty 0.96

## 2012-03-27 MED ORDER — ATORVASTATIN CALCIUM 80 MG PO TABS
80.0000 mg | ORAL_TABLET | Freq: Every day | ORAL | Status: DC
  Administered 2012-03-27 – 2012-03-28 (×2): 80 mg via ORAL
  Filled 2012-03-27 (×3): qty 1

## 2012-03-27 MED ORDER — ALBUTEROL SULFATE (5 MG/ML) 0.5% IN NEBU: 2.5000 mg | INHALATION_SOLUTION | RESPIRATORY_TRACT | Status: DC | PRN

## 2012-03-27 MED ORDER — ASPIRIN EC 81 MG PO TBEC: 81.0000 mg | DELAYED_RELEASE_TABLET | Freq: Every day | ORAL | Status: DC

## 2012-03-27 MED ORDER — LISINOPRIL 20 MG PO TABS
20.0000 mg | ORAL_TABLET | Freq: Two times a day (BID) | ORAL | Status: DC
  Administered 2012-03-27 – 2012-03-29 (×4): 20 mg via ORAL
  Filled 2012-03-27 (×6): qty 1

## 2012-03-27 MED ORDER — SODIUM CHLORIDE 0.9 % IV SOLN: 250.0000 mL | INTRAVENOUS | Status: DC | PRN

## 2012-03-27 MED ORDER — ALBUTEROL SULFATE (5 MG/ML) 0.5% IN NEBU
2.5000 mg | INHALATION_SOLUTION | Freq: Four times a day (QID) | RESPIRATORY_TRACT | Status: DC
  Administered 2012-03-27 (×3): 2.5 mg via RESPIRATORY_TRACT
  Filled 2012-03-27 (×3): qty 0.5

## 2012-03-27 MED ORDER — ONDANSETRON HCL 4 MG/2ML IJ SOLN
4.0000 mg | Freq: Four times a day (QID) | INTRAMUSCULAR | Status: DC | PRN
  Administered 2012-03-27: 4 mg via INTRAVENOUS
  Filled 2012-03-27: qty 2

## 2012-03-27 MED ORDER — ALBUTEROL SULFATE (5 MG/ML) 0.5% IN NEBU
2.5000 mg | INHALATION_SOLUTION | Freq: Four times a day (QID) | RESPIRATORY_TRACT | Status: DC
  Administered 2012-03-28 (×2): 2.5 mg via RESPIRATORY_TRACT
  Filled 2012-03-27 (×2): qty 0.5

## 2012-03-27 MED ORDER — CARVEDILOL 25 MG PO TABS
25.0000 mg | ORAL_TABLET | Freq: Two times a day (BID) | ORAL | Status: DC
  Administered 2012-03-27 – 2012-03-29 (×5): 25 mg via ORAL
  Filled 2012-03-27 (×7): qty 1

## 2012-03-27 MED ORDER — IPRATROPIUM BROMIDE 0.02 % IN SOLN
0.5000 mg | RESPIRATORY_TRACT | Status: DC
  Administered 2012-03-27: 0.5 mg via RESPIRATORY_TRACT
  Filled 2012-03-27: qty 2.5

## 2012-03-27 MED ORDER — ACETAMINOPHEN 325 MG PO TABS: 650.0000 mg | ORAL_TABLET | Freq: Four times a day (QID) | ORAL | Status: DC | PRN

## 2012-03-27 MED ORDER — GUAIFENESIN-DM 100-10 MG/5ML PO SYRP
5.0000 mL | ORAL_SOLUTION | ORAL | Status: DC | PRN
  Administered 2012-03-28 (×3): 5 mL via ORAL
  Filled 2012-03-27 (×3): qty 5

## 2012-03-27 MED ORDER — IPRATROPIUM BROMIDE 0.02 % IN SOLN
0.5000 mg | Freq: Four times a day (QID) | RESPIRATORY_TRACT | Status: DC
  Administered 2012-03-27 (×3): 0.5 mg via RESPIRATORY_TRACT
  Filled 2012-03-27 (×3): qty 2.5

## 2012-03-27 MED ORDER — ACETAMINOPHEN 650 MG RE SUPP: 650.0000 mg | Freq: Four times a day (QID) | RECTAL | Status: DC | PRN

## 2012-03-27 NOTE — H&P (Signed)
Internal Medicine Teaching Service Attending Note Date: 03/27/2012  Patient name: Jesus Sanders  Medical record number: [id number]  Date of birth: 1936/08/05   I have seen and evaluated Jesus Sanders and discussed their care with the Residency Team. Please see Jesus Sanders H&P for full details. I agree with the formulated Assessment and Plan with the following changes:  1. Acute on Chronic respiratory failure -   Jesus Sanders has no formal documentation of chronic resp disease but he has a long h/o tobacco use, recurrent RLL PNA, and an abnl CXR. He most likely has undiagnosed and untreated COPD. PFT's were ordered last year as an outpt but were never completed. Jesus Sanders has noted DOE and rec a walking program but pt denies sig DOE and states can do all ADL's without dyspnea and able to walk 3 times weekly 0.25 miles prior to dyspnea.   Jesus Sanders presented with a couple days of dyspnea above baseline along with an increased productive cough. He was afebrile and without a leukocytosis. His exam was pertinent for an O2 of 90% of 5 L and a prolonged exp phase with scattered wheezing. He improved symptomatic with a neb and continues to feel better this AM.  Most likely dx is COPD exac 2/2 CAP. Would continue ABX, O2, steroids, and nebs. Once improved, and likely that will be in 1-2 days, he can be D/C'd and would rec a Combivent MDI and outpt PFT's. Due to the abnl in the RLL, he will need a repeat CXR in 6 weeks. Since this abnl has been seen on prior CXR's and pt is a tobacco user, low threshold for CT is CXR doesn't show resolution.  2. CAD -   Jesus Sanders has no CP, EKG was unchanged, and Trop I was negative times 1. Low likelihood that he has active ischemia. Cont home meds.  3. Polycythemia vera -   Pt sees Jesus Sanders and has recently resumed phlebotomy if crit > 50. His goal is crit of 40. He gets CBC monthly to see if he needs phlebotomy. He is on an ASA and Jesus Sanders has extensively  counseled need for complete tobacco cessation. Pt has h/o one VTE (pt recall) and is 76 yrs old but is not on hydroxyurea. This is Jesus Sanders's specialty will defer to his medical decisions.  4. Acute kidney injury -   Jesus Sanders has not used an prn lasix and is just on his stable dose of aldactone. Will watch creatinine.  5. Dispo -   Pt lives alone. His daughter lives next door and another daughter one door down. His grand kids and daughters help clean the house. He is able to do light cooking. Independent in all ADL's. No falls. He continues to drive. He states he walks three times a week. PT has eval pt and he needs nothing further. Likely D/C one or two days if hypoxia clears. Will need outpt PFT's and repeat CXR.

## 2012-03-27 NOTE — H&P (Signed)
Hospital Admission Note Date: 03/27/2012  Patient name: Jesus Sanders Medical record number: [id number] Date of birth: [birth year] Age: 76 y.o. Gender: male PCP: No primary provider on file.  Medical Service: Internal Medicine Teaching Service - [facility]  Attending physician:  Blanch Media, MD    1st Contact:   Dede Query  Pager: [phone] 2nd Contact:   Lyn Hollingshead  Pager:[phone] After 5 pm or weekends: 1st Contact:      Pager: [phone] 2nd Contact:      Pager: [phone]  Chief Complaint: SOB  History of Present Illness: Jesus Sanders is a 76 yo with PMHx significant for ischemic cardiomyopathy with EF 25-30% status post prophylactic ICD placement 01/2012, CAD with h/o anterior apical MI s/p bare metal stent to LAD (07/2011), hypertension, polycythemia vera with h/o recurrent DVT, chronic bronchitis and recurrent pneumonia (RLL) who presents to the ED with c/o worsening SOB above his baseline for the past couple of days associated with increased clear/whitish sputum.  He denies sick contacts, fever, chills hemoptysis, chest pain, sore throat or cold-like symptoms. Denies vomiting, hematuria, dysuria or change in bowel habits including blood in stools.  Has not increased usage of his Albuterol inhaler. He reports compliance with his outpt CHF regimen including Lisinopril, Aldactone, and Carvedilol but has not felt the need to use his prn Lasix. He continues to smoke 2-3 cigars daily and quit cigarettes 02/2011 (prior 60 pack years). He is followed by Dr. [place] of [facility] last evaluated or 02/27/2012 for continued shortness of breath and sputum production whereby his carvedilol was increased to 25 mg twice a day and sodium restriction was recommended. It was noted that his shortness of breath was likely not improving secondary to deconditioning and Jesus Sanders was encouraged to begin a walking program.    Of note, pt received Azithromycin and Rocephin in ED along with nebulized Albuterol after which  the pt reported feeling back at his baseline.  Meds:  (Not in a hospital admission) No current facility-administered medications on file prior to encounter.   Current Outpatient Prescriptions on File Prior to Encounter  Medication Sig Dispense Refill  . aspirin EC 81 MG tablet Take 81 mg by mouth daily.      Marland Kitchen atorvastatin (LIPITOR) 80 MG tablet TAKE 1 TABLET AT BEDTIME  30 tablet  6  . carvedilol (COREG) 25 MG tablet Take 1 tablet (25 mg total) by mouth 2 (two) times daily.  60 tablet  11  . Chlorpheniramine Maleate (ALLERGY RELIEF PO) Take 1 tablet by mouth daily.       . furosemide (LASIX) 20 MG tablet Take 20 mg by mouth daily as needed. Use as needed for swelling or for weight gain of 2 to 3 pounds overnight.      Marland Kitchen lisinopril (PRINIVIL,ZESTRIL) 20 MG tablet Take 20 mg by mouth 2 (two) times daily.      . pantoprazole (PROTONIX) 40 MG tablet Take 40 mg by mouth daily.        Marland Kitchen spironolactone (ALDACTONE) 25 MG tablet Take 12.5 mg by mouth daily.      Marland Kitchen DISCONTD: lisinopril (PRINIVIL,ZESTRIL) 10 MG tablet Take 1 tablet (10 mg total) by mouth 2 (two) times daily.  60 tablet  11    Allergies: Allergies as of 03/26/2012  . (No Known Allergies)   Past Medical History  Diagnosis Date  . Polycythemia vera     [facility]  will restart his  phlebotomies from about Mar 15 2011  . Stomach ulcer   . STEMI (ST elevation myocardial infarction) Sept 2012    Larger anterior apical MI with BMS to LAD with EF of 25 to 30%  . Tobacco abuse     stopped in April of 2012  . Duodenal perforation June 2012  . Peritonitis June 2012  . S/P cardiac cath Sept 2012    BMS to LAD  . Pneumonia April 2012  . LV dysfunction Sept 2012    EF is 25%  . Systolic CHF, chronic    Past Surgical History  Procedure Date  . Cholecystectomy 03/2011    Dr. Carolynne Edouard  . Colon surgery   . Cardiac catheterization Sept 2012    Normal left main, occluded  LAD, 40% LCX and normal RCA. EF is 30%  . US echocardiography Sept 2012    EF 25 to 30% with akinesis of the mid to distal anterior and apical myocardium, trivial AI and no apical thrombus  . Cardiac defibrillator placement    Family History  Problem Relation Age of Onset  . Lung disease Father    History   Social History  . Marital Status: Married    Spouse Name: N/A    Number of Children: N/A  . Years of Education: N/A   Occupational History  . Not on file.   Social History Main Topics  . Smoking status: Passive Smoker    Types: Cigarettes    Last Attempt to Quit: 03/04/2011  . Smokeless tobacco: Not on file   Comment: Quit smoking in April 2012. About 60-pack-year history of smoking  . Alcohol Use: No     socially  . Drug Use: No  . Sexually Active: Not on file   Other Topics Concern  . Not on file   Social History Narrative   Lives with his daughter at home. Pretty functional and does ADLs by himself.Wife died about 4 years ago.Has Medicare.     Review of Systems: Constitutional:  Denies fever, chills, diaphoresis, appetite change and fatigue.  HEENT: Denies congestion, sore throat, rhinorrhea  Respiratory: Endorses SOB, DOE, cough, and wheezing.  Cardiovascular: Denies chest pain, palpitations and leg swelling.  Gastrointestinal: Denies nausea, vomiting, abdominal pain, diarrhea, constipation, blood in stool and abdominal distention.  Genitourinary: Denies dysuria, hematuria  Musculoskeletal: Denies myalgias or joint swelling  Skin: Denies pallor, rash and wound.  Neurological: Denies dizziness, seizures, syncope, weakness, light-headedness, numbness and headaches.   Hematological: Endorses bruising  Psychiatric/ Behavioral: Denies suicidal ideation, mood changes, confusion, nervousness, sleep disturbance and agitation.     Physical Exam: Blood pressure 96/53, pulse 70, temperature 98.6 F (37 C), temperature source Oral, resp. rate 28, SpO2  90.00%. General: Well-developed, well-nourished, White male, lying flat on left side, in no acute distress; Head: Normocephalic, atraumatic, ruddy complexion Eyes: PERRLA, EOMI, No signs of anemia or jaundice. Throat: Oropharynx nonerythematous, no exudate appreciated, black tinged color to palate surface of tongue (chews tobacco).  Neck: supple, no masses, no carotid Bruits, no JVD appreciated. Lungs: Normal respiratory effort. Few scattered wheezes throughout with prolonged expiratory phase, decreased air movement bilateral bases, no crackles appreciated Heart: normal rate, regular rhythm, normal S1 and S2, no gallop, murmur, or rubs appreciated. Abdomen: BS normoactive. Soft, Nondistended, non-tender. No masses or organomegaly appreciated. Extremities: No pretibial edema, distal pulses intact Neurologic: grossly non-focal, alert and oriented x3, appropriate and cooperative throughout examination.   Lab results: Basic Metabolic Panel:  Basename 03/26/12 2147  NA  140  K 4.5  CL 104  CO2 --  GLUCOSE 87  BUN 24*  CREATININE 1.20  CALCIUM --  MG --  PHOS --    CBC:  Basename 03/26/12 2147 03/26/12 2133  WBC -- 12.6*  NEUTROABS -- 10.0*  HGB 17.3* 15.6  HCT 51.0 48.8  MCV -- 79.0  PLT -- 126*   BNP:  Basename 03/26/12 2133  PROBNP 924.4*    Imaging results:  Dg Chest Port 1 View  03/26/2012  *RADIOLOGY REPORT*  Clinical Data: Shortness of breath, cough.  PORTABLE CHEST - 1 VIEW  Comparison: 01/12/2012  Findings: Cardiomegaly.  Perihilar and bibasilar opacities. Increased interstitial markings.  Small right greater than left pleural effusions.  No pneumothorax.  Left chest wall battery pack with lead tip projecting over the right ventricle, incompletely imaged.  No acute osseous finding.  IMPRESSION: Cardiomegaly with  bilateral opacities; edema versus infiltrate.  Small right greater than left pleural effusions.  Original Report Authenticated By: Waneta Martins, M.D.     Other results: EKG: 78 bpm,  increased upward slope of ST Leads II-III from prior, LAFB, T-wave inversions antero-lateral leads unchanged from prior  Assessment & Plan by Problem: 76 year old man with past medical history of recurrent pneumonias in the right lower lobe, coronary artery disease s/p PCI with stent, ischemic cardiomyopathy s/p ICD placement, and probable COPD admitted with worsening sob, wheezing and cough with CXR findings worrisome for recurrent pneumonia.  #1: Shortness of breath: With history of similar admissions in past, prior chest CT and Xray suggestive of emphysema and diffuse wheezing, we will treat this as COPD exacerbation precipitated by community-acquired pneumonia. After discussing with the radiologist, chest x-ray findings do not support atypical or PCP pneumonia. He does not have history or physical exam findings concerning for PE although he has a risk for PE given his polycythemia vera. Geneva score is intermediate (age, prior DVT, PCV). He does have congestive heart failure but his chest x-ray does not support pulmonary edema. Cardiac causes for shortness of breath will be given strong consideration given his multiple risk factors although EKG is unchanged, troponin is negative and he denies chest pain.  He is without signs of volume overload although his BNP is elevated. PLAN - Sputum culture and sensitivity - Rocephin and Zithromax parenterally for community-acquired pneumonia in the right lower lobe - Albuterol and ipratropium nebulizers every 6 hours as needed for shortness of breath and wheezing -Solu-Medrol 60 mg IV twice a day -Physiotherapy for deconditioning and chest rehabilitation -Oxygen supplementation as needed -Consider outpt PFTs which were ordered previously in 2012 but not performed    #2: Coronary artery disease and CHF: he denies any symptoms suggestive of ACS. EKG is unchanged. Initial cardiac enzymes negative.  No firing of  ICD. PLAN -Continue aspirin, beta blocker, statin, ACE inhibitor.  -Would hold his diuretics (Lasix and Aldactone) tonight for possible volume depletion given his creatinine is up from his baseline and he does not have any physical exam findings of volume overload. May restart tomorrow    #3 Polycythemia Vera:  Hematocrit less than 50, does not require phlebotomy. Followed by Dr. Russ Halo outpatient PLAN -continue aspirin 81 mg daily -per Hematologist plan to resume phlegbotomy q2 week if hematocrit greater than or equal 50    #4 peptic ulcer disease, history of duodenal perforation: stable PLAN -continue Protonix at home dose  #5 acute kidney injury: elevation of creatinine on iStat from 0.9 2 months ago to 1.2 on  admission today.  Likely prerenal as pt is on diuretics. 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 No changes over period of time on chest x-ray. Outpatient pulmonology follow up was recommended which can be pursued at the time of discharge.  #7 Deconditioning PLAN - physical therapy eval and treat  #8 tobacco abuse: continues cigars and smokeless tobacco PLAN -smoking cessation counseling in the hospital  #9 DVT prophylaxis-Lovenox  Signed: Kristie Cowman 03/27/2012, 12:04 AM

## 2012-03-27 NOTE — Progress Notes (Signed)
Subjective: Pt looks and feels better. Had good sleep, though was little sleepy when I saw him. His SOB improved since admission. Has no N/V .  Objective: Vital signs in last 24 hours: Filed Vitals:   03/27/12 0250 03/27/12 0530 03/27/12 0835 03/27/12 0839  BP:  106/68  107/64  Pulse:  70  69  Temp:  98.9 F (37.2 C)    TempSrc:  Oral    Resp:  20  18  Height:      Weight:      SpO2: 93% 92% 92% 98%   Weight change:   Intake/Output Summary (Last 24 hours) at 03/27/12 1226 Last data filed at 03/27/12 1100  Gross per 24 hour  Intake 1070.5 ml  Output    575 ml  Net  495.5 ml   Physical Exam: General: resting in bed HEENT: PERRL, EOMI, no scleral icterus Cardiac: RRR, no rubs, murmurs or gallops Pulm: good air movement. Minimal diffuse expiratory wheezes B/L Abd: soft, nontender, nondistended, BS present Ext: warm and well perfused, no pedal edema Neuro: alert and oriented X3, cranial nerves II-XII grossly intact  Lab Results: Basic Metabolic Panel:  Lab 03/27/12 4098 03/27/12 0249  NA 138 141  K 4.6 4.8  CL 105 104  CO2 26 30  GLUCOSE 99 97  BUN 21 21  CREATININE 0.83 0.99  CALCIUM 9.2 9.5  MG -- 2.4  PHOS -- --   Liver Function Tests:  Lab 03/27/12 0249  AST 11  ALT 8  ALKPHOS 75  BILITOT 0.5  PROT 6.1  ALBUMIN 2.9*   CBC:  Lab 03/27/12 0500 03/26/12 2147 03/26/12 2133  WBC 10.1 -- 12.6*  NEUTROABS -- -- 10.0*  HGB 14.8 17.3* --  HCT 46.3 51.0 --  MCV 78.9 -- 79.0  PLT 109* -- 126*   BNP:  Lab 03/26/12 2133  PROBNP 924.4*   Studies/Results: Dg Chest Port 1 View  03/26/2012  *RADIOLOGY REPORT*  Clinical Data: Shortness of breath, cough.  PORTABLE CHEST - 1 VIEW  Comparison: 01/12/2012  Findings: Cardiomegaly.  Perihilar and bibasilar opacities. Increased interstitial markings.  Small right greater than left pleural effusions.  No pneumothorax.  Left chest wall battery pack with lead tip projecting over the right ventricle, incompletely  imaged.  No acute osseous finding.  IMPRESSION: Cardiomegaly with  bilateral opacities; edema versus infiltrate.  Small right greater than left pleural effusions.  Original Report Authenticated By: Waneta Martins, M.D.   Medications: I have reviewed the patient's current medications. Scheduled Meds:   . albuterol  2.5 mg Nebulization Q6H  . albuterol  5 mg Nebulization Once  . albuterol      . aspirin  324 mg Oral Once  . aspirin EC  81 mg Oral Daily  . atorvastatin  80 mg Oral q1800  . azithromycin (ZITHROMAX) 500 MG IVPB  500 mg Intravenous QHS  . carvedilol  25 mg Oral BID WC  . cefTRIAXone (ROCEPHIN)  IV  1 g Intravenous Q24H  . enoxaparin  40 mg Subcutaneous Q24H  . ipratropium  0.5 mg Nebulization Q6H  . lisinopril  20 mg Oral BID  . methylPREDNISolone (SOLU-MEDROL) injection  60 mg Intravenous Q12H  . pantoprazole  40 mg Oral Q1200  . sodium chloride  3 mL Intravenous Q12H  . DISCONTD: aspirin EC  81 mg Oral Daily  . DISCONTD: azithromycin (ZITHROMAX) 500 MG IVPB  500 mg Intravenous Once   Continuous Infusions:   . albuterol  PRN Meds:.sodium chloride, acetaminophen, acetaminophen, guaiFENesin-dextromethorphan, ondansetron (ZOFRAN) IV, ondansetron, sodium chloride Assessment/Plan:  #1: Acute on Chronic Respiratory Failure: Most likely COPD exacerbation precipitated by community-acquired pneumonia. Pt never had PFT's.  CXR- RLL PNA. Pt clinically getting better.  - Continue Rocephin and Zithromax IV for RLL PNA. - DuoNebs and solumedrol. Transition to po prednisone tomorrow. -Physiotherapy - no new recs for PT - Will arrange outpt PFTs which were ordered previously in 2012 but not performed   #2: Coronary artery disease and CHF: stable at present.  -Continue aspirin, beta blocker, statin, ACE inhibitor.  -continue Aldactone. No need for lasix or diuresis at present as patient clinically improved with COPD exac Marliss Czar Rx  #3 Polycythemia Vera: . Followed by Dr.  Russ Halo outpatient   -continue aspirin 81 mg daily  -will leave phlebotomy cut offs to Dr. Russ Halo as he is followed by him closely.  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 No changes over period of time on chest x-ray. Outpatient pulmonology follow up was recommended which can be pursued at the time of discharge.   #7 Deconditioning  - physical therapy eval and treat-  No recs for continued PT  #8 tobacco abuse: continues cigars and smokeless tobacco  PLAN  -smoking cessation counseling in the hospital   #9 DVT prophylaxis-Lovenox     LOS: 1 day   Takela Varden 03/27/2012, 12:26 PM

## 2012-03-27 NOTE — Evaluation (Signed)
Physical Therapy Evaluation Patient Details Name: Jesus Sanders MRN: [id number] DOB: 1936/04/25 Today's Date: 03/27/2012 Time: 0811-0828 PT Time Calculation (min): 17 min  PT Assessment / Plan / Recommendation Clinical Impression  [name] is 76 y/o male admitted with COPD exacerbation and CAP. Presents to PT today at his baseline mobility. Ambulated independently but became SOB on RA, was unable to get a good sats reading, RN notified and nursing tech to ambulate with pt later to determine his need for O2. Pt definitely with decreased activity tolerance and encouraged to ambulate while here in the hospital as well as beginning a progressive ambulation program at home. Pt was offerred information concerning ambulation program and community resources but he declined these reporting that he knows what to do he just doesn't do it. No further acute PT needs at this time. Recommend pt ambulate frequent short walks throughout the day while here in the hospital with nursing.     PT Assessment  Patent does not need any further PT services    Follow Up Recommendations  No PT follow up    Barriers to Discharge        lEquipment Recommendations  None recommended by PT    Recommendations for Other Services     Frequency      Precautions / Restrictions     Pertinent Vitals/Pain Pt was 94% on 2 liters originally in his room. Reported that he doesn't need oxygen so ambulated on RA. Pt became short of breath during ambulation but I was unable to get an adequate reading of her O2 saturation so RN notified and O2 replaced when pt back in his room. Educated pt on pursed lip breathing and pt able to demonstrate understanding. Nursing tech to ambulate with pt later today to determine need for O2 during ambulation.       Mobility  Bed Mobility Bed Mobility: Sit to Supine Sit to Supine: 7: Independent Details for Bed Mobility Assistance: pt sitting EOB on presentation Transfers Transfers: Sit to  Stand;Stand to Sit Sit to Stand: 6: Modified independent (Device/Increase time);With upper extremity assist;From bed Stand to Sit: To bed;With upper extremity assist Ambulation/Gait Ambulation/Gait Assistance: 7: Independent Ambulation Distance (Feet): 200 Feet Assistive device: None Gait Pattern: Within Functional Limits    Exercises     PT Diagnosis:    PT Problem List:   PT Treatment Interventions:     PT Goals    Visit Information  Last PT Received On: 03/27/12 Assistance Needed: +1    Subjective Data  Subjective: Im independent at home.  Patient Stated Goal: home   Prior Functioning  Home Living Lives With: Alone Available Help at Discharge: Family (daughters get meals together for him, clean and do laundry) Type of Home: House Home Access: Stairs to enter Secretary/administrator of Steps: 3 Entrance Stairs-Rails: Right Home Layout: One level Bathroom Shower/Tub: Engineer, manufacturing systems: Standard Home Adaptive Equipment: None Prior Function Level of Independence: Needs assistance Needs Assistance: Meal Prep;Light Housekeeping Meal Prep: Moderate Light Housekeeping: Moderate Able to Take Stairs?: Reciprically Driving: Yes Vocation: Retired Musician: No difficulties    Cognition  Overall Cognitive Status: Appears within functional limits for tasks assessed/performed Arousal/Alertness: Awake/alert Orientation Level: Appears intact for tasks assessed Behavior During Session: [facility] for tasks performed    Extremity/Trunk Assessment Right Upper Extremity Assessment RUE ROM/Strength/Tone: Within functional levels RUE Sensation: WFL - Light Touch;WFL - Proprioception RUE Coordination: WFL - gross/fine motor Left Upper Extremity Assessment LUE ROM/Strength/Tone: Within functional levels  LUE Sensation: WFL - Light Touch;WFL - Proprioception LUE Coordination: WFL - gross/fine motor Right Lower Extremity Assessment RLE ROM/Strength/Tone:  Within functional levels RLE Sensation: WFL - Light Touch;WFL - Proprioception RLE Coordination: WFL - gross/fine motor Left Lower Extremity Assessment LLE ROM/Strength/Tone: Within functional levels LLE Sensation: WFL - Light Touch;WFL - Proprioception LLE Coordination: WFL - gross/fine motor Trunk Assessment Trunk Assessment: Normal   Balance High Level Balance High Level Balance Comments: pt able to make sudden stops, scan the environment appropriately, avoid obstacles, and perform turns with no difficulty  End of Session PT - End of Session Equipment Utilized During Treatment: Gait belt Activity Tolerance: Patient tolerated treatment well Patient left: in bed;with call bell/phone within reach Nurse Communication: Mobility status   [facility] HELEN 03/27/2012, 9:48 AM

## 2012-03-27 NOTE — ED Notes (Signed)
RN unable to take report at this time.  Gave extension # for [phone] RN to call back

## 2012-03-28 LAB — BASIC METABOLIC PANEL
CO2: 24 mEq/L (ref 19–32)
Calcium: 9.5 mg/dL (ref 8.4–10.5)
Creatinine, Ser: 0.93 mg/dL (ref 0.50–1.35)
GFR calc non Af Amer: 81 mL/min — ABNORMAL LOW (ref >90)
Glucose, Bld: 172 mg/dL — ABNORMAL HIGH (ref 70–99)
Sodium: 136 mEq/L (ref 135–145)

## 2012-03-28 LAB — GLUCOSE, CAPILLARY
Glucose-Capillary: 169 mg/dL — ABNORMAL HIGH (ref 70–99)
Glucose-Capillary: 114 mg/dL — ABNORMAL HIGH (ref 70–99)

## 2012-03-28 LAB — EXPECTORATED SPUTUM ASSESSMENT W GRAM STAIN, RFLX TO RESP C

## 2012-03-28 MED ORDER — TIOTROPIUM BROMIDE MONOHYDRATE 18 MCG IN CAPS
18.0000 ug | ORAL_CAPSULE | Freq: Every day | RESPIRATORY_TRACT | Status: DC
Start: 2012-03-29 — End: 2012-03-29
  Administered 2012-03-29: 18 ug via RESPIRATORY_TRACT
  Filled 2012-03-28: qty 5

## 2012-03-28 MED ORDER — ALBUTEROL SULFATE HFA 108 (90 BASE) MCG/ACT IN AERS
2.0000 | INHALATION_SPRAY | Freq: Four times a day (QID) | RESPIRATORY_TRACT | Status: DC
  Administered 2012-03-28 – 2012-03-29 (×4): 2 via RESPIRATORY_TRACT
  Filled 2012-03-28: qty 6.7

## 2012-03-28 MED ORDER — ALBUTEROL SULFATE HFA 108 (90 BASE) MCG/ACT IN AERS
2.0000 | INHALATION_SPRAY | RESPIRATORY_TRACT | Status: DC | PRN
  Filled 2012-03-28: qty 6.7

## 2012-03-28 NOTE — Progress Notes (Signed)
Internal Medicine Teaching Service Attending Note Date: 03/28/2012  Patient name: Jesus Sanders  Medical record number: [id number]  Date of birth: 27-Apr-1936    This patient has been seen and discussed with the house staff. Please see their note for complete details. I concur with their findings with the following additions/corrections: [name] was walking the hallways (can walk very fast and independent) to assess O2 and desat to 81% on RA. Pt does not use O2 at home but was D/C'd prior hospital stay with temp O2 2/2 hypoxia. Pt didn't feel like he needed it after a while so sent it back. Will likely need O2 at D/C (reassess in AM) and can be reassessed as oupt for ongoing need. It is possible to he always desats to low 80's with exertion 2/2 chronic lung disease and it simply has never been checked in the clinic setting when he is at baseline.  Hyland Mollenkopf 03/28/2012, 11:16 AM

## 2012-03-28 NOTE — Progress Notes (Signed)
Pt. On R/A.  Sat. Was 91%.  Pt. Ambulated approx. 100 ft. On R/A.  Sat was 81%.  Placed back on O2. When back in room.  Pt. Denied symptoms, but appeared moderately dyspneic after ambulation with some pursed lip breathing.

## 2012-03-28 NOTE — Progress Notes (Signed)
Subjective: Patient feels much better.  He still has moderate productive cough with whitish sputum, but his shortness of breath is much better. Oxygen saturation is 92-98% on 2.5L [place] which has significantly improved since admission. No acute events overnight.   Objective: Vital signs in last 24 hours: Filed Vitals:   03/28/12 0512 03/28/12 0907 03/28/12 0915 03/28/12 0920  BP: 118/75     Pulse: 67 70    Temp: 98.7 F (37.1 C)     TempSrc: Oral     Resp: 20 18    Height:      Weight: 187 lb 8 oz (85.049 kg)     SpO2: 92% 91% 81% 94%   Weight change: 2 lb 8.5 oz (1.149 kg)  Intake/Output Summary (Last 24 hours) at 03/28/12 1445 Last data filed at 03/28/12 1000  Gross per 24 hour  Intake   1223 ml  Output   1550 ml  Net   -327 ml   General: resting in bed  HEENT: PERRL, EOMI, no scleral icterus  Cardiac: RRR, no rubs, murmurs or gallops  Pulm: Bibasilar lungs sounds diminished with a few rales on RLL. Occasional expiratory wheezing posteriorly.  No rhonchi is noted Overall better air movement then yesterday. Abd: soft, nontender, nondistended, BS present  Ext: warm and well perfused, no pedal edema  Neuro: alert and oriented X3, cranial nerves II-XII grossly intact     Lab Results: Basic Metabolic Panel:  Lab 03/28/12 1610 03/27/12 0500 03/27/12 0249  Jesus Sanders 136 138 --  K 5.1 4.6 --  CL 101 105 --  CO2 24 26 --  GLUCOSE 172* 99 --  BUN 32* 21 --  CREATININE 0.93 0.83 --  CALCIUM 9.5 9.2 --  MG -- -- 2.4  PHOS -- -- --   Liver Function Tests:  Lab 03/27/12 0249  AST 11  ALT 8  ALKPHOS 75  BILITOT 0.5  PROT 6.1  ALBUMIN 2.9*   CBC:  Lab 03/27/12 0500 03/26/12 2147 03/26/12 2133  WBC 10.1 -- 12.6*  NEUTROABS -- -- 10.0*  HGB 14.8 17.3* --  HCT 46.3 51.0 --  MCV 78.9 -- 79.0  PLT 109* -- 126*   BNP:  Lab 03/26/12 2133  PROBNP 924.4*   Micro Results: Recent Results (from the past 240 hour(s))  CULTURE, EXPECTORATED SPUTUM-ASSESSMENT     Status:  Normal   Collection Time   03/28/12 10:19 AM      Component Value Range Status Comment   Specimen Description SPUTUM   Final    Special Requests NONE   Final    Sputum evaluation     Final    Value: THIS SPECIMEN IS ACCEPTABLE. RESPIRATORY CULTURE REPORT TO FOLLOW.   Report Status 03/28/2012 FINAL   Final    Studies/Results: Dg Chest Port 1 View  03/26/2012  *RADIOLOGY REPORT*  Clinical Data: Shortness of breath, cough.  PORTABLE CHEST - 1 VIEW  Comparison: 01/12/2012  Findings: Cardiomegaly.  Perihilar and bibasilar opacities. Increased interstitial markings.  Small right greater than left pleural effusions.  No pneumothorax.  Left chest wall battery pack with lead tip projecting over the right ventricle, incompletely imaged.  No acute osseous finding.  IMPRESSION: Cardiomegaly with  bilateral opacities; edema versus infiltrate.  Small right greater than left pleural effusions.  Original Report Authenticated By: Waneta Martins, M.D.   Medications: I have reviewed the patient's current medications. Scheduled Meds:   . albuterol  2 puff Inhalation Q6H  . albuterol      .  aspirin EC  81 mg Oral Daily  . atorvastatin  80 mg Oral q1800  . azithromycin (ZITHROMAX) 500 MG IVPB  500 mg Intravenous QHS  . carvedilol  25 mg Oral BID WC  . cefTRIAXone (ROCEPHIN)  IV  1 g Intravenous Q24H  . enoxaparin  40 mg Subcutaneous Q24H  . lisinopril  20 mg Oral BID  . methylPREDNISolone (SOLU-MEDROL) injection  60 mg Intravenous Q12H  . pantoprazole  40 mg Oral Q1200  . sodium chloride  3 mL Intravenous Q12H  . tiotropium  18 mcg Inhalation Daily  . DISCONTD: albuterol  2.5 mg Nebulization Q6H  . DISCONTD: albuterol  2.5 mg Nebulization Q4H  . DISCONTD: albuterol  2.5 mg Nebulization Q6H  . DISCONTD: ipratropium  0.5 mg Nebulization Q6H  . DISCONTD: ipratropium  0.5 mg Nebulization Q4H  . DISCONTD: ipratropium  0.5 mg Nebulization Q6H   Continuous Infusions:   . DISCONTD: albuterol     PRN  Meds:.sodium chloride, acetaminophen, acetaminophen, albuterol, guaiFENesin-dextromethorphan, ondansetron (ZOFRAN) IV, ondansetron, sodium chloride, DISCONTD: albuterol Assessment/Plan:  #1: Acute COPD exacerbation 2/2 CAP: Clinically getting better.  Pt never had PFT's.   - Oxygen therapy>>> not on home O2 and not properly followed up by either pulmonologist for PCP.                              >>>able to tolerate 2.5L [place] well. However, desat to 81% on RA>>>will arrange HH O2 - Continue Rocephin and Zithromax IV for RLL PNA.  - DuoNebs and solumedrol. Transition to po prednisone tomorrow.  - Will arrange outpt PFTs which were ordered previously in 2012 but not performed  #2: Coronary artery disease and CHF: stable at present.  -Continue aspirin, beta blocker, statin, ACE inhibitor.  -continue Aldactone. No need for lasix or diuresis at present as patient clinically improved with COPD exac Jesus Sanders Rx  #3 Polycythemia Vera: . Followed by Dr. Russ Halo outpatient  -continue aspirin 81 mg daily  -will leave phlebotomy cut offs to Dr. Russ Halo as he is followed by him closely.  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 No changes over period of time on chest x-ray. Outpatient pulmonology follow up was recommended which can be pursued at the time of discharge.  #7 Deconditioning  - physical therapy eval and treat- No recs for continued PT  #8 tobacco abuse: continues cigars and smokeless tobacco  PLAN  -smoking cessation counseling in the hospital  #9 DVT prophylaxis-Lovenox   LOS: 2 days   Jesus Sanders 03/28/2012, 2:45 PM

## 2012-03-28 NOTE — Progress Notes (Signed)
Pt. Had been given Robitussin DM for cough.  States not helpful.  C & R mod. Amts. Of tan sputum.  Specimen obtained for culture as ordered.

## 2012-03-28 NOTE — Progress Notes (Signed)
Aforementioned note was reported to MD as ordered

## 2012-03-29 LAB — BASIC METABOLIC PANEL
BUN: 34 mg/dL — ABNORMAL HIGH (ref 6–23)
CO2: 28 mEq/L (ref 19–32)
Chloride: 101 mEq/L (ref 96–112)
Creatinine, Ser: 0.9 mg/dL (ref 0.50–1.35)
GFR calc Af Amer: >90 mL/min (ref >90)
Glucose, Bld: 130 mg/dL — ABNORMAL HIGH (ref 70–99)
Potassium: 4.9 mEq/L (ref 3.5–5.1)

## 2012-03-29 LAB — CBC
HCT: 47.1 % (ref 39.0–52.0)
Hemoglobin: 15.3 g/dL (ref 13.0–17.0)
MCV: 78.5 fL (ref 78.0–100.0)
RBC: 6 MIL/uL — ABNORMAL HIGH (ref 4.22–5.81)
RDW: 18.1 % — ABNORMAL HIGH (ref 11.5–15.5)
WBC: 16.1 10*3/uL — ABNORMAL HIGH (ref 4.0–10.5)

## 2012-03-29 LAB — GLUCOSE, CAPILLARY: Glucose-Capillary: 154 mg/dL — ABNORMAL HIGH (ref 70–99)

## 2012-03-29 MED ORDER — FLUTICASONE-SALMETEROL 250-50 MCG/DOSE IN AEPB: 1.0000 | INHALATION_SPRAY | Freq: Two times a day (BID) | RESPIRATORY_TRACT | Status: DC

## 2012-03-29 MED ORDER — GUAIFENESIN-DM 100-10 MG/5ML PO SYRP: 5.0000 mL | ORAL_SOLUTION | ORAL | Status: AC | PRN

## 2012-03-29 MED ORDER — PREDNISONE 20 MG PO TABS: ORAL_TABLET | ORAL | Status: AC

## 2012-03-29 MED ORDER — ALBUTEROL SULFATE HFA 108 (90 BASE) MCG/ACT IN AERS: 2.0000 | INHALATION_SPRAY | RESPIRATORY_TRACT | Status: DC | PRN

## 2012-03-29 MED ORDER — DOXYCYCLINE HYCLATE 100 MG PO TABS: 100.0000 mg | ORAL_TABLET | Freq: Two times a day (BID) | ORAL | Status: AC

## 2012-03-29 MED ORDER — TIOTROPIUM BROMIDE MONOHYDRATE 18 MCG IN CAPS: 18.0000 ug | ORAL_CAPSULE | Freq: Every day | RESPIRATORY_TRACT | Status: DC

## 2012-03-29 NOTE — Discharge Instructions (Signed)
1. You will nee home Oxygen 2 L [place], will set up for you. 2. Continue your home medications with the following new changes     Doxycycline 100 mg po twice daily     Prednisone tapering dose     Albuterol inhaler 2 puffs every 4 hours as needed for SOB     Advair 1 puff twice daily starting after you finish prednisone tapering dose.    Robitussin DM as needed for your cough. 3. Follow up with your choice of Primary care physician in 2 weeks.     You will need to repeat chest X ray in 4- 6 weeks.    You will also need to have outpatient pulmonary function test done as an outpatient.

## 2012-03-29 NOTE — Progress Notes (Signed)
0900 PULSE  OX  2L/M  RESTING =96%           Pulse ox on ambulation  2b l/m =92%           puse ox recovery  On 2l/m       =93%   Dr. Nedra Hai notified

## 2012-03-29 NOTE — Progress Notes (Addendum)
Internal Medicine Teaching Service Attending Note Date: 03/29/2012  Patient name: Jesus Sanders  Medical record number: [id number]  Date of birth: 10/22/1936    This patient has been seen and discussed with the house staff. Please see their note for complete details. I concur with their findings with the following additions/corrections: [name] continues to look and feel great. He has breath sounds throughout entire lung fields but with poor air movement L>R. He was able to ambulate in hallways without desat - but that was on O2. He liked the MDI's & DPI. He is willing to F/U with primary care located within Dr [facility] practice. Will need continued assessment of oxygen status, 6 week CXR, PFT's, and assessment to MDI / DPI.  Kenneth Cuaresma 03/29/2012, 10:36 AM

## 2012-03-29 NOTE — Care Management Note (Signed)
    Page 1 of 1   03/29/2012     10:31:12 AM   CARE MANAGEMENT NOTE 03/29/2012  Patient:  Jesus Sanders, Jesus Sanders   Account Number:  [id number]  Date Initiated:  03/29/2012  Documentation initiated by:  Tera Mater  Subjective/Objective Assessment:   75yo male admitted Acute Respiratory Failure.     Action/Plan:   Discharge planning for home oxygen   Anticipated DC Date:  03/29/2012   Anticipated DC Plan:  HOME/SELF CARE      DC Planning Services  CM consult      Choice offered to / List presented to:     DME arranged  [facility]      DME agency  [facility]        Status of service:  Completed, signed off Medicare Important Message given?   (If response is "NO", the following Medicare IM given date fields will be blank) Date Medicare IM given:   Date Additional Medicare IM given:    Discharge Disposition:  HOME/SELF CARE  Per UR Regulation:  Reviewed for med. necessity/level of care/duration of stay  If discussed at Long Length of Stay Meetings, dates discussed:    Comments:  03/29/12 1030 TC To [place] with [facility] to give referral for DME home oxygen. Tera Mater, RN, BSN NCM [phone]

## 2012-03-29 NOTE — Progress Notes (Signed)
1400 d/cd home with faMILY MEMBER WITH O2 IN PROGRESS

## 2012-03-29 NOTE — Progress Notes (Signed)
SATURATION QUALIFICATIONS:  Patient Saturations on Room Air at Rest = 90%  Patient Saturations on Room Air while Ambulating = 86%  Patient Saturations on 2.5 Liters of oxygen while Ambulating = 95%

## 2012-03-29 NOTE — Progress Notes (Signed)
S: Patient states that he feels much better. Less productive cough. No c/o SOB.      No acute event overnight. O2 Sat 95% at rest and 92% with ambulation on 2 L oxygen. O: General: NAD      Lungs: better air movement. Right LLL some rales noted. Occasional wheezing posteriorly B/L. No rhonchi noted.      Heart: RRR      ABD: Soft, BS x 4       EXT: no edema A/P - patient is stable, remain afebrile since admission. Ready to go home today. - will set up home Oxygen 2 L [place]. - respiratory to teach on use of spacer. - will continue home medications with the following new changes.     Doxycycline 100 mg po twice daily     Prednisone tapering dose     Albuterol inhaler 2 puffs every 4 hours as needed for SOB     Advair 1 puff twice daily starting after you finish prednisone tapering dose.     Robitussin DM as needed for your cough. 3. Follow up with his choice of Primary care physician in 2 weeks.     He will need to repeat chest X ray in 4- 6 weeks.    He will also need to have outpatient pulmonary function test done as an outpatient.

## 2012-03-31 LAB — CULTURE, RESPIRATORY W GRAM STAIN

## 2012-03-31 LAB — CULTURE, RESPIRATORY

## 2012-04-01 NOTE — Discharge Summary (Signed)
Internal Medicine Teaching [facility] Discharge Note  Name: Jesus Sanders MRN: [id number] DOB: 02-01-1936 76 y.o.  Date of Admission: 03/26/2012  9:10 PM Date of Discharge: 03/29/2012 Attending Physician: Dr. Blanch Media  Discharge Diagnosis:   1. Community acquired pneumonia  2. Acute COPD exacerbation  3. 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 Polycythemia vera  5. Acute kidney injury  6. CAD (coronary artery disease)  7. Chronic systolic heart failure  8. GERD (gastroesophageal reflux disease  9. Tobacco abuse  Discharge Medications: Medication List  As of 04/01/2012  6:56 PM   TAKE these medications         albuterol 108 (90 BASE) MCG/ACT inhaler   Commonly known as: PROVENTIL HFA;VENTOLIN HFA   Inhale 2 puffs into the lungs every 4 (four) hours as needed for wheezing or shortness of breath.      ALLERGY RELIEF PO   Take 1 tablet by mouth daily.      aspirin EC 81 MG tablet   Take 81 mg by mouth daily.      atorvastatin 80 MG tablet   Commonly known as: LIPITOR   TAKE 1 TABLET AT BEDTIME      carvedilol 25 MG tablet   Commonly known as: COREG   Take 1 tablet (25 mg total) by mouth 2 (two) times daily.      doxycycline 100 MG tablet   Commonly known as: VIBRA-TABS   Take 1 tablet (100 mg total) by mouth 2 (two) times daily.      Fluticasone-Salmeterol 250-50 MCG/DOSE Aepb   Commonly known as: ADVAIR   Inhale 1 puff into the lungs 2 (two) times daily.      furosemide 20 MG tablet   Commonly known as: LASIX   Take 20 mg by mouth daily as needed. Use as needed for swelling or for weight gain of 2 to 3 pounds overnight.      guaiFENesin-dextromethorphan 100-10 MG/5ML syrup   Commonly known as: ROBITUSSIN DM   Take 5 mLs by mouth every 4 (four) hours as needed for cough.      lisinopril 20 MG tablet   Commonly known as: PRINIVIL,ZESTRIL   Take 20 mg by mouth 2 (two) times daily.      pantoprazole 40 MG tablet   Commonly known as: PROTONIX   Take 40 mg by mouth daily.      predniSONE 20 MG tablet   Commonly known as: DELTASONE   3 Tabs PO Days 1-3, then 2 tabs PO Days 4-6, then 1 tab PO Day 7-9, then Half Tab PO Day 10-12      spironolactone 25 MG tablet   Commonly known as: ALDACTONE   Take 12.5 mg by mouth daily.            Disposition and follow-up:   Jesus Sanders was discharged from [facility] in Stable condition.   Follow-up Appointments:  Follow up with [facility] primary care team. Will call [facility] primary care team for an appointment. Patient will be notified once the appt is set up. 1. Please repeat chest Xray in 6 weeks, low threshold for chest CT if his RLL CAP is not completely resolved. 2. Please check his CBC and BMP 3. Please obtain outpatient PFT 4. Please continue reinforcement of Tobacco cessation.  Discharge Orders    Future Appointments: Provider: Department: Dept Phone: Center:   04/09/2012 1:00 PM Krista Blue [facility] [phone] None  04/17/2012 9:00 AM Duke Salvia, MD [facility] [phone] [facility]   05/07/2012 1:00 PM Krista Blue [facility] [phone] None   06/04/2012 1:00 PM Radene Gunning [facility] [phone] None   06/28/2012 1:30 PM Peter M [place], MD [facility] [phone] None   07/02/2012 1:00 PM Delcie Roch [facility] [phone] None   08/06/2012 1:00 PM Delcie Roch [facility] [phone] None   09/03/2012 1:00 PM Delcie Roch [facility] [phone] None   10/01/2012 1:00 PM Delcie Roch [facility] [phone] None   11/05/2012 1:00 PM Delcie Roch [facility] [phone] None   12/03/2012 1:00 PM Delcie Roch [facility] [phone] None   12/31/2012 1:00 PM Delcie Roch [facility] [phone] None   01/17/2013 11:30 AM Lowella Dell, MD [facility] [phone] None   02/04/2013 1:00 PM Delcie Roch [facility] [phone] None    03/04/2013 1:00 PM Delcie Roch [facility] [phone] None      Consultations:  none  Procedures Performed:  Dg Chest Port 1 View  03/26/2012  *RADIOLOGY REPORT*  Clinical Data: Shortness of breath, cough.  PORTABLE CHEST - 1 VIEW  Comparison: 01/12/2012  Findings: Cardiomegaly.  Perihilar and bibasilar opacities. Increased interstitial markings.  Small right greater than left pleural effusions.  No pneumothorax.  Left chest wall battery pack with lead tip projecting over the right ventricle, incompletely imaged.  No acute osseous finding.  IMPRESSION: Cardiomegaly with  bilateral opacities; edema versus infiltrate.  Small right greater than left pleural effusions.  Original Report Authenticated By: Waneta Martins, M.D.    Admission HPI:  Jesus Sanders is a 76 yo with PMHx significant for ischemic cardiomyopathy with EF 25-30% status post prophylactic ICD placement 01/2012, CAD with h/o anterior apical MI s/p bare metal stent to LAD (07/2011), hypertension, polycythemia vera with h/o recurrent DVT, chronic bronchitis and recurrent pneumonia (RLL) who presents to the ED with c/o worsening SOB above his baseline for the past couple of days associated with increased clear/whitish sputum. He denies sick contacts, fever, chills hemoptysis, chest pain, sore throat or cold-like symptoms. Denies vomiting, hematuria, dysuria or change in bowel habits including blood in stools. Has not increased usage of his Albuterol inhaler. He reports compliance with his outpt CHF regimen including Lisinopril, Aldactone, and Carvedilol but has not felt the need to use his prn Lasix. He continues to smoke 2-3 cigars daily and quit cigarettes 02/2011 (prior 60 pack years). He is followed by Dr. [place] of [facility] last evaluated or 02/27/2012 for continued shortness of breath and sputum production whereby his carvedilol was increased to 25 mg twice a day and sodium restriction was recommended. It was noted that  his shortness of breath was likely not improving secondary to deconditioning and Jesus Sanders was encouraged to begin a walking program.  Of note, pt received Azithromycin and Rocephin in ED along with nebulized Albuterol after which the pt reported feeling back at his baseline.   Hospital Course by problem list:  # Acute COPD exacerbation     Patient most likely has undiagnosed and untreated COPD given his long history of Tobacco abuse, recurrent RLL PNA and abnormal chest X ray  though it is not documented. PFT was ordered last year but it was never completed.   His clinical manifestation was consistent with COPD exacerbation secondary to CAP on admission. He was treated with Oxygen therapy, bronchodilators Nebs, steroids and antibiotics, and his symptoms  were largely improved upon discharge. He is stable and will be discharged home with home O2, prednisone 12-day tapering dose, Doxycycline 10-day course. He is also given albuterol inhaler ( with a spacer) as needed and instructed to start Advair inhaler once his prednisone is finished.    He will need a repeat chest X ray in 6 weeks to evaluated complete resolution of his CAP. He will have a low threshold for chest CT if his CXR does not show resolution given his history of recurrent RLL PNA and long term Tobacco abuse. He will also need outpatient PFT.  I will contact [facility] primary care team to see if he can establish care with them since he has no PCP and would like to go to [facility] for his medical needs.   Of note,  He was noted to desat to 80's on RA and 90's on 2L [place] with ambulation. We have consulted case manager who setup home O2 for him.    # Community acquired pneumonia and Acute COPD exacerbation     Patient presented with two-day course of productive cough and worsening of SOB above his baseline on admission. He was afebrile and without a leukocytosis. He was noted to have an O2sat of 90% on 5L O2 and scattered  wheezing with RLL rales on exam.  Though his chest X ray suggested bilateral opacities, R>L, it supported the diagnosis of RLL recurrent CAP rather than pulmonary edema or atypical pneumonia after thorough reviewing his old chest x rays/chest CT and discussing with the Radiologist by admitting residents. See above treatment discussion.  # leukocytosis    Patient was without leukocytosis on admission and was noted to have WBC of 16.1 upon discharge. The etiology is likely de margination secondary to steroid treatment. He is clinically improved and will follow up as an outpatient for a repeat CBC.   # Acute kidney injury, baseline 0.9  he was noted to have mild elevated Cr level of 1.20 on admission. The etiology is likely prerenal. His diuretic was on hold during this admission,  and his AKI quickly resolved with Cr back to 0.9.    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 Darnelle Catalan as an outpatient with monthly CBC count. His HH is 15/47 upon discharge. Will defer the management to Dr. Jaynie Bream.  (patient has recently resumed phlebotomy if crit > 50. His goal is crit of 40).    # Tobacco abuse    Patient quit cigarettes on 02/17/12 but continues to smoke 2-3 cigars daily. He was educated extensively on smoking cessation in the past and again during this admission.    #CAD (coronary artery disease), stable  #Chronic systolic heart failure, stable  #GERD (gastroesophageal reflux disease, stable       Discharge Vitals:  BP 133/70  Pulse 66  Temp(Src) 97.6 F (36.4 C) (Oral)  Resp 18  Ht 6' (1.829 m)  Wt 187 lb (84.823 kg)  BMI 25.36 kg/m2  SpO2 96%  Discharge Labs: No results found for this or any previous visit (from the past 24 hour(s)).  Signed: Dominique Ressel 04/01/2012, 6:56 PM   Time Spent on Discharge: 45 minutes

## 2012-04-03 ENCOUNTER — Telehealth: Payer: Self-pay | Admitting: *Deleted

## 2012-04-03 ENCOUNTER — Other Ambulatory Visit: Payer: Self-pay | Admitting: Oncology

## 2012-04-03 NOTE — Telephone Encounter (Signed)
patient confirmed over the phone on 04-03-2012 arrival time 12:45pm for lab and then Jesus Sanders

## 2012-04-03 NOTE — Telephone Encounter (Signed)
per orders from 04-03-2012 added on amy berry after lab on 05-07-2012

## 2012-04-05 ENCOUNTER — Telehealth: Payer: Self-pay | Admitting: *Deleted

## 2012-04-05 ENCOUNTER — Telehealth: Payer: Self-pay | Admitting: Internal Medicine

## 2012-04-05 NOTE — Telephone Encounter (Signed)
Message copied by Deatra James on Thu Apr 05, 2012 12:02 PM ------      Message from: COUSIN, SHARON T      Created: Thu Apr 05, 2012 10:54 AM      Regarding: PT WAS DISCHARGED FROM HOSP 03/29/12       Dr Felicity Coyer,  Dr Dede Query is requesting Heywood Bene to be seen by you as a new pt before your next availability.  Pt was discharged from hosp  03/29/12---Can I work pt in?   Thank you for your reply.

## 2012-04-05 NOTE — Telephone Encounter (Signed)
I have called [facility] for an new patient referral.  The only physician who takes pure medicare is Dr. Precious Haws her first available appointment will in December. I left my pager to the [facility] office staff for them to call me if Dr. Felicity Coyer could see him sooner. I called our [facility] clinic and setup an hospital follow up at 215 pm on 04/13/12 I have notified patient of his appointment.

## 2012-04-05 NOTE — Telephone Encounter (Signed)
Per EMR, pt has a hosp OV set up with [facility] 04/13/12 at 215 - i cannot see him sooner than that scheduled visit because i am not here next week -   However if pt wishes to est here, you can work him in sometime in late June or July - THIS work in is in addition to the [facility] hosp visit on 04/13/12 - pt sould keep OV there as scheduled!

## 2012-04-09 ENCOUNTER — Other Ambulatory Visit: Payer: Medicare Other | Admitting: Lab

## 2012-04-11 ENCOUNTER — Encounter: Payer: Self-pay | Admitting: *Deleted

## 2012-04-11 DIAGNOSIS — Z95810 Presence of automatic (implantable) cardiac defibrillator: Secondary | ICD-10-CM | POA: Insufficient documentation

## 2012-04-13 ENCOUNTER — Ambulatory Visit (INDEPENDENT_AMBULATORY_CARE_PROVIDER_SITE_OTHER): Payer: Medicare Other | Admitting: Internal Medicine

## 2012-04-13 ENCOUNTER — Encounter: Payer: Self-pay | Admitting: Internal Medicine

## 2012-04-13 VITALS — BP 145/84 | HR 77 | Temp 97.6°F | Ht 72.0 in | Wt 188.0 lb

## 2012-04-13 DIAGNOSIS — J189 Pneumonia, unspecified organism: Secondary | ICD-10-CM

## 2012-04-13 DIAGNOSIS — J441 Chronic obstructive pulmonary disease with (acute) exacerbation: Secondary | ICD-10-CM

## 2012-04-13 DIAGNOSIS — J449 Chronic obstructive pulmonary disease, unspecified: Secondary | ICD-10-CM

## 2012-04-13 DIAGNOSIS — D45 Polycythemia vera: Secondary | ICD-10-CM

## 2012-04-13 DIAGNOSIS — Z9109 Other allergy status, other than to drugs and biological substances: Secondary | ICD-10-CM

## 2012-04-13 DIAGNOSIS — J309 Allergic rhinitis, unspecified: Secondary | ICD-10-CM

## 2012-04-13 NOTE — Assessment & Plan Note (Addendum)
Recently hospitalized with presumed COPD exacerbation, treated with steroids, antibiotics, and nebulizers. Has been on home O2, but in ambulatory sat here in clinic is 95%. Does complain of some sputum production that is clear. I think this is likely post nasal drip and allergies given his description of rhinorrhea, pruritus. Will need PFTs to formally diagnosed COPD, and will need chest x-ray followup pneumonia.  - Chest x-ray at next visit - PFTs scheduled today  - continue Advair and albuterol

## 2012-04-13 NOTE — Assessment & Plan Note (Signed)
Recently discharged for CAD and COPD exacerbation. Doing well overall, still with some sputum production that I think is related to postnasal drip and allergies. Ambulatory O2 sat is 95%, thus can stop home O2. Will need a followup chest x-ray in 4 weeks' time. Because patient will be establishing care at [facility], can wait until his appointment on 7/29. - Followup chest x-ray - DC home O2 - Continue COPD treatment

## 2012-04-13 NOTE — Assessment & Plan Note (Signed)
Followed by the cancer [facility] and Dr.Magrinat. Has followup on July 1 - Followup as scheduled

## 2012-04-13 NOTE — Progress Notes (Signed)
Subjective:     Patient ID: Jesus Sanders, male   DOB: [birth year], 76 y.o.   MRN: [id number]  HPI Patient is a very pleasant 76 year old in with history of polycythemia vera, ischemic cardiomyopathy, COPD, and recent hospitalization for CAD with a COPD exacerbation.  Since his discharge 2 weeks ago the patient reports feeling well overall. He has finished his course of antibiotics and prednisone. He continues to take Advair twice daily. He takes albuterol only as needed, and has only needed it once or twice. He does continue to have clear sputum production throughout the day. He also has rhinorrhea and pruritus of the eyes and nose. He did report one episode of hemoptysis, that has since cleared up. He continues to use oxygen at home, particularly at night.   Review of Systems No chest pain, shortness of breath, palpitations     Objective:   Physical Exam Filed Vitals:   04/13/12 1416  BP: 145/84  Pulse: 77  Temp: 97.6 F (36.4 C)  O2 sat at rest: 96% O2 sat with ambulation: 95% GEN: NAD.  Alert and oriented x 3.  Pleasant, conversant, and cooperative to exam. RESP:  CTAB, poor air movement CARDIOVASCULAR: RRR EXT: warm and dry. Trace pitting edema in b/l LE but R>L     Assessment:         Plan:

## 2012-04-13 NOTE — Assessment & Plan Note (Signed)
Patient complains of rhinorrhea, pruritus in the eyes and nose, and continued clear sputum production. Despite his recent CAP and COPD exacerbation, I think his sputum is likely related to postnasal drip and allergies. We discussed starting Flonase and Claritin. He is currently taking an allergy pill on a when necessary basis, but he is not sure what it is. We discussed the importance of avoiding triggers for possible COPD exacerbations, and this includes properly controlling his allergies. This should be addressed by his new primary doctor at his appointment on 7/29. - Flonase - Claritin - Continue to follow

## 2012-04-16 MED ORDER — FLUTICASONE PROPIONATE 50 MCG/ACT NA SUSP: 2.0000 | Freq: Every day | NASAL | Status: DC

## 2012-04-16 NOTE — Progress Notes (Signed)
Addended by: Daryel Gerald on: 04/16/2012 03:53 PM   Modules accepted: Orders

## 2012-04-17 ENCOUNTER — Encounter: Payer: Self-pay | Admitting: Internal Medicine

## 2012-04-17 ENCOUNTER — Ambulatory Visit (INDEPENDENT_AMBULATORY_CARE_PROVIDER_SITE_OTHER): Payer: Medicare Other | Admitting: Internal Medicine

## 2012-04-17 ENCOUNTER — Ambulatory Visit (HOSPITAL_COMMUNITY)
Admission: RE | Admit: 2012-04-17 | Discharge: 2012-04-17 | Disposition: A | Discharge status: Home / Self Care | Payer: Medicare Other | Source: Ambulatory Visit | Attending: Internal Medicine | Admitting: Internal Medicine

## 2012-04-17 VITALS — BP 152/89 | HR 68 | Ht 72.0 in | Wt 187.8 lb

## 2012-04-17 DIAGNOSIS — I10 Essential (primary) hypertension: Secondary | ICD-10-CM

## 2012-04-17 DIAGNOSIS — I2589 Other forms of chronic ischemic heart disease: Secondary | ICD-10-CM

## 2012-04-17 DIAGNOSIS — J449 Chronic obstructive pulmonary disease, unspecified: Secondary | ICD-10-CM

## 2012-04-17 DIAGNOSIS — Z95810 Presence of automatic (implantable) cardiac defibrillator: Secondary | ICD-10-CM

## 2012-04-17 DIAGNOSIS — I5022 Chronic systolic (congestive) heart failure: Secondary | ICD-10-CM

## 2012-04-17 DIAGNOSIS — J4489 Other specified chronic obstructive pulmonary disease: Secondary | ICD-10-CM | POA: Insufficient documentation

## 2012-04-17 DIAGNOSIS — I255 Ischemic cardiomyopathy: Secondary | ICD-10-CM

## 2012-04-17 LAB — ICD DEVICE OBSERVATION
BATTERY VOLTAGE: 3.2007 V
BRDY-0002RV: 40 {beats}/min
CHARGE TIME: 2.602 s
DEV-0020ICD: NEGATIVE
PACEART VT: 0
RV LEAD AMPLITUDE: 7.75 mv
TOT-0001: 1
TOT-0006: 20130306000000
TZAT-0002SLOWVT: NEGATIVE
TZAT-0012FASTVT: 200 ms
TZAT-0012SLOWVT: 200 ms
TZAT-0018SLOWVT: NEGATIVE
TZAT-0019FASTVT: 8 V
TZAT-0019SLOWVT: 8 V
TZAT-0020FASTVT: 1.5 ms
TZAT-0020SLOWVT: 1.5 ms
TZON-0003SLOWVT: 360 ms
TZST-0001FASTVT: 2
TZST-0001FASTVT: 5
TZST-0001SLOWVT: 5
TZST-0001SLOWVT: 6
TZST-0002FASTVT: NEGATIVE
TZST-0002FASTVT: NEGATIVE
TZST-0002SLOWVT: NEGATIVE
TZST-0002SLOWVT: NEGATIVE
TZST-0002SLOWVT: NEGATIVE
VENTRICULAR PACING ICD: 0.01 pct

## 2012-04-17 MED ORDER — ALBUTEROL SULFATE (5 MG/ML) 0.5% IN NEBU
2.5000 mg | INHALATION_SOLUTION | Freq: Once | RESPIRATORY_TRACT | Status: AC
  Administered 2012-04-17: 2.5 mg via RESPIRATORY_TRACT

## 2012-04-17 NOTE — Progress Notes (Signed)
HPI  Jesus Sanders is a 76 y.o. male . He is seen in followup for an ICD implanted spring 2013 for ischemic cardiac myopathy prior MI and congestive failure.  The patient denies chest pain, shortness of breath, nocturnal dyspnea, orthopnea or peripheral edema.  There have been no palpitations, lightheadedness or syncope.   no breast tenderness  Past Medical History  Diagnosis Date  . Polycythemia vera     [facility]  will restart his phlebotomies from about Mar 15 2011  . Stomach ulcer   . STEMI (ST elevation myocardial infarction) Sept 2012    Larger anterior apical MI with BMS to LAD with EF of 25 to 30%  . Tobacco abuse     stopped in April of 2012  . Duodenal perforation June 2012  . Peritonitis June 2012  . S/P cardiac cath Sept 2012    BMS to LAD  . Pneumonia April 2012  . LV dysfunction Sept 2012    EF is 25%  . Systolic CHF, chronic   . GERD (gastroesophageal reflux disease)   . Ischemic cardiomyopathy   . Hypertension   . History of DVT (deep vein thrombosis)   . Chronic bronchitis     Past Surgical History  Procedure Date  . Cholecystectomy 03/2011    Dr. Carolynne Edouard  . Colon surgery   . Cardiac catheterization Sept 2012    Normal left main, occluded LAD, 40% LCX and normal RCA. EF is 30%  . US echocardiography Sept 2012    EF 25 to 30% with akinesis of the mid to distal anterior and apical myocardium, trivial AI and no apical thrombus  . Cardiac defibrillator placement     single chamber  . Shoulder arthroscopy     left, rotatotor cuff tendinopathy    Current Outpatient Prescriptions  Medication Sig Dispense Refill  . albuterol (PROVENTIL HFA;VENTOLIN HFA) 108 (90 BASE) MCG/ACT inhaler Inhale 2 puffs into the lungs every 4 (four) hours as needed for wheezing or shortness of breath.  1 Inhaler  4  . aspirin EC 81 MG tablet Take 81 mg by mouth daily.      Marland Kitchen atorvastatin (LIPITOR) 80 MG tablet TAKE  1 TABLET AT BEDTIME  30 tablet  6  . carvedilol (COREG) 25 MG tablet Take 1 tablet (25 mg total) by mouth 2 (two) times daily.  60 tablet  11  . Chlorpheniramine Maleate (ALLERGY RELIEF PO) Take 1 tablet by mouth daily.       . fluticasone (FLONASE) 50 MCG/ACT nasal spray Place 2 sprays into the nose daily. 2 sprays into each nostril daily.  16 g  2  . Fluticasone-Salmeterol (ADVAIR DISKUS) 250-50 MCG/DOSE AEPB Inhale 1 puff into the lungs 2 (two) times daily.  60 each  1  . furosemide (LASIX) 20 MG tablet Take 20 mg by mouth daily as needed. Use as needed for swelling or for weight gain of 2 to 3 pounds overnight.      Marland Kitchen lisinopril (PRINIVIL,ZESTRIL) 20 MG tablet Take 20 mg by mouth 2 (two) times daily.      Marland Kitchen loratadine (CLARITIN) 10 MG tablet Take 10 mg by mouth daily.      . pantoprazole (PROTONIX) 40 MG tablet Take 40 mg by mouth daily.        Marland Kitchen spironolactone (ALDACTONE) 25 MG tablet Take 12.5 mg by mouth daily.      Marland Kitchen DISCONTD: lisinopril (PRINIVIL,ZESTRIL) 10 MG  tablet Take 1 tablet (10 mg total) by mouth 2 (two) times daily.  60 tablet  11    No Known Allergies  Review of Systems negative except from HPI and PMH  Physical Exam BP 152/89  Pulse 68  Ht 6' (1.829 m)  Wt 187 lb 12.8 oz (85.186 kg)  BMI 25.47 kg/m2 Well developed and well nourished in no acute distress HENT normal E scleral and icterus clear Neck Supple JVP flat; carotids brisk and full Clear to ausculation poocekt well healed regular rate and rhythm, no murmurs gallops or rub Soft with active bowel sounds No clubbing cyanosis none Edema Alert and oriented, grossly normal motor and sensory function Skin Warm and Dry    Assessment and  Plan

## 2012-04-17 NOTE — Assessment & Plan Note (Signed)
Stable  optovol flat  euvolemic

## 2012-04-17 NOTE — Assessment & Plan Note (Signed)
Blood pressure is elevated. We will increase his Aldactone today from 12.5-25 check a metabolic profile in 2 weeks

## 2012-04-17 NOTE — Patient Instructions (Addendum)
Remote monitoring is used to monitor your Pacemaker of ICD from home. This monitoring reduces the number of office visits required to check your device to one time per year. It allows [name] to keep an eye on the functioning of your device to ensure it is working properly. You are scheduled for a device check from home on 07/23/12. You may send your transmission at any time that day. If you have a wireless device, the transmission will be sent automatically. After your physician reviews your transmission, you will receive a postcard with your next transmission date.  Your physician wants you to follow-up in: 9 months with Dr. Graciela Husbands (March 2014). You will receive a reminder letter in the mail two months in advance. If you don't receive a letter, please call our office to schedule the follow-up appointment.  Your physician has recommended you make the following change in your medication:  1) Increase aldactone (spironolactone) to 25 mg one whole tablet by mouth once daily.  Your physician recommends that you return for lab work in: 2 weeks- bmp

## 2012-04-17 NOTE — Assessment & Plan Note (Signed)
contniue current meds  

## 2012-04-17 NOTE — Assessment & Plan Note (Signed)
The patient's device was interrogated.  The information was reviewed. No changes were made in the programming.    

## 2012-05-01 ENCOUNTER — Other Ambulatory Visit (INDEPENDENT_AMBULATORY_CARE_PROVIDER_SITE_OTHER): Payer: Medicare Other

## 2012-05-01 DIAGNOSIS — I5022 Chronic systolic (congestive) heart failure: Secondary | ICD-10-CM

## 2012-05-01 DIAGNOSIS — I255 Ischemic cardiomyopathy: Secondary | ICD-10-CM

## 2012-05-01 DIAGNOSIS — I10 Essential (primary) hypertension: Secondary | ICD-10-CM

## 2012-05-01 DIAGNOSIS — I2589 Other forms of chronic ischemic heart disease: Secondary | ICD-10-CM

## 2012-05-01 DIAGNOSIS — Z95810 Presence of automatic (implantable) cardiac defibrillator: Secondary | ICD-10-CM

## 2012-05-01 LAB — BASIC METABOLIC PANEL
BUN: 17 mg/dL (ref 6–23)
CO2: 29 mEq/L (ref 19–32)
Chloride: 104 mEq/L (ref 96–112)
Creatinine, Ser: 1.1 mg/dL (ref 0.4–1.5)
Glucose, Bld: 87 mg/dL (ref 70–99)

## 2012-05-07 ENCOUNTER — Ambulatory Visit (HOSPITAL_BASED_OUTPATIENT_CLINIC_OR_DEPARTMENT_OTHER): Payer: Medicare Other | Admitting: Physician Assistant

## 2012-05-07 ENCOUNTER — Other Ambulatory Visit: Payer: Self-pay | Admitting: Physician Assistant

## 2012-05-07 ENCOUNTER — Telehealth: Payer: Self-pay | Admitting: *Deleted

## 2012-05-07 ENCOUNTER — Encounter: Payer: Self-pay | Admitting: Physician Assistant

## 2012-05-07 ENCOUNTER — Ambulatory Visit (HOSPITAL_BASED_OUTPATIENT_CLINIC_OR_DEPARTMENT_OTHER): Payer: Medicare Other

## 2012-05-07 ENCOUNTER — Other Ambulatory Visit: Payer: Medicare Other | Admitting: Lab

## 2012-05-07 VITALS — BP 158/89 | HR 73 | Temp 96.8°F

## 2012-05-07 VITALS — BP 163/83 | HR 75 | Temp 98.7°F | Ht 72.0 in | Wt 187.6 lb

## 2012-05-07 DIAGNOSIS — Z86718 Personal history of other venous thrombosis and embolism: Secondary | ICD-10-CM

## 2012-05-07 DIAGNOSIS — D45 Polycythemia vera: Secondary | ICD-10-CM

## 2012-05-07 DIAGNOSIS — Z8673 Personal history of transient ischemic attack (TIA), and cerebral infarction without residual deficits: Secondary | ICD-10-CM

## 2012-05-07 DIAGNOSIS — F17200 Nicotine dependence, unspecified, uncomplicated: Secondary | ICD-10-CM

## 2012-05-07 LAB — CBC WITH DIFFERENTIAL/PLATELET
EOS%: 1.3 % (ref 0.0–7.0)
Eosinophils Absolute: 0.1 10*3/uL (ref 0.0–0.5)
LYMPH%: 26.5 % (ref 14.0–49.0)
MCH: 26.2 pg — ABNORMAL LOW (ref 27.2–33.4)
MCV: 77.9 fL — ABNORMAL LOW (ref 79.3–98.0)
MONO%: 8.7 % (ref 0.0–14.0)
NEUT#: 5.8 10*3/uL (ref 1.5–6.5)
Platelets: 130 10*3/uL — ABNORMAL LOW (ref 140–400)
RBC: 6.64 10*6/uL — ABNORMAL HIGH (ref 4.20–5.82)
nRBC: 0 % (ref 0–0)

## 2012-05-07 NOTE — Progress Notes (Signed)
One unit phlebotomy taken from right antecubital area without any difficulty.  End bag weight was 550 grams. Patient tolerated well and was given cheese and crackers.  Observed for 30 minutes post procedure, then discharged.

## 2012-05-07 NOTE — Telephone Encounter (Signed)
Per orders sent patient to the treatment area for phelb started to the patient I will call him with his weekly labs and phelb apppointments emailed michelle to set up patient's treatment

## 2012-05-07 NOTE — Progress Notes (Signed)
ID: [name]   DOB: Jan 14, 1936  MR#: [id number]  VHQ#:[id number]  HISTORY OF PRESENT ILLNESS:    INTERVAL HISTORY: Mr. 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 The interval history is remarkable for recent hospitalization in May of this year for pneumonia. He continues to be followed closely for COPD, as well as multiple cardiac issues.   REVIEW OF SYSTEMS: Unfortunately he continues to smoke, but is no longer smoking cigarettes. He is smoking, instead, 8-10 cigars a day. Of course he understands that this is still smoking, and at that cigars are harmful as well.  He denies any recent fevers or chills. No nausea or change in bowel habits. No abnormal bleeding. He has shortness of breath, especially with exertion, but denies any significant cough or phlegm production. No chest pain or pressure. No abnormal headaches or pain elsewhere. No peripheral swelling.  A detailed review of systems is otherwise stable.  PAST MEDICAL HISTORY: Past Medical History  Diagnosis Date  . Polycythemia vera     [facility]  will restart his phlebotomies from about Mar 15 2011  . Stomach ulcer   . STEMI (ST elevation myocardial infarction) Sept 2012    Larger anterior apical MI with BMS to LAD with EF of 25 to 30%  . Tobacco abuse     stopped in April of 2012  . Duodenal perforation June 2012  . Peritonitis June 2012  . S/P cardiac cath Sept 2012    BMS to LAD  . Pneumonia April 2012  . LV dysfunction Sept 2012    EF is 25%  . Systolic CHF, chronic   . GERD (gastroesophageal reflux disease)   . Ischemic cardiomyopathy   . Hypertension   . History of DVT (deep vein thrombosis)   . Chronic bronchitis     PAST SURGICAL HISTORY: Past Surgical History  Procedure Date  . Cholecystectomy 03/2011    Dr. Carolynne Edouard  . Colon surgery   . Cardiac catheterization Sept 2012    Normal left main, occluded LAD, 40% LCX and normal  RCA. EF is 30%  . US echocardiography Sept 2012    EF 25 to 30% with akinesis of the mid to distal anterior and apical myocardium, trivial AI and no apical thrombus  . Cardiac defibrillator placement     single chamber  . Shoulder arthroscopy     left, rotatotor cuff tendinopathy    FAMILY HISTORY Family History  Problem Relation Age of Onset  . Lung disease Father     SOCIAL HISTORY:    ADVANCED DIRECTIVES: Living will in place  HEALTH MAINTENANCE: History  Substance Use Topics  . Smoking status: Current Everyday Smoker    Types: Cigars    Last Attempt to Quit: 03/04/2011  . Smokeless tobacco: Not on file   Comment: Quit smoking in April 2012. About 60-pack-year history of smoking  . Alcohol Use: No     socially   No Known Allergies  Current Outpatient Prescriptions  Medication Sig Dispense Refill  . albuterol (PROVENTIL HFA;VENTOLIN HFA) 108 (90 BASE) MCG/ACT inhaler Inhale 2 puffs into the lungs every 4 (four) hours as needed for wheezing or shortness of breath.  1 Inhaler  4  . aspirin EC 81 MG tablet Take 81 mg by mouth daily.      Marland Kitchen atorvastatin (LIPITOR) 80 MG tablet TAKE 1 TABLET AT BEDTIME  30 tablet  6  . carvedilol (COREG)  25 MG tablet Take 1 tablet (25 mg total) by mouth 2 (two) times daily.  60 tablet  11  . Chlorpheniramine Maleate (ALLERGY RELIEF PO) Take 1 tablet by mouth daily.       . fluticasone (FLONASE) 50 MCG/ACT nasal spray Place 2 sprays into the nose daily. 2 sprays into each nostril daily.  16 g  2  . Fluticasone-Salmeterol (ADVAIR DISKUS) 250-50 MCG/DOSE AEPB Inhale 1 puff into the lungs 2 (two) times daily.  60 each  1  . furosemide (LASIX) 20 MG tablet Take 20 mg by mouth daily as needed. Use as needed for swelling or for weight gain of 2 to 3 pounds overnight.      Marland Kitchen lisinopril (PRINIVIL,ZESTRIL) 20 MG tablet Take 20 mg by mouth 2 (two) times daily.      Marland Kitchen loratadine (CLARITIN) 10 MG tablet Take 10 mg by mouth daily.      . pantoprazole  (PROTONIX) 40 MG tablet Take 40 mg by mouth daily.        Marland Kitchen spironolactone (ALDACTONE) 25 MG tablet Take one tablet by mouth once daily      . DISCONTD: lisinopril (PRINIVIL,ZESTRIL) 10 MG tablet Take 1 tablet (10 mg total) by mouth 2 (two) times daily.  60 tablet  11    OBJECTIVE: Elderly white male who appears comfortable and in no acute distress. Filed Vitals:   05/07/12 1329  BP: 163/83  Pulse: 75  Temp: 98.7 F (37.1 C)     Body mass index is 25.44 kg/(m^2).    ECOG FS: 2  Filed Weights   05/07/12 1329  Weight: 187 lb 9.6 oz (85.095 kg)   Physical Exam: HEENT:  Sclerae anicteric.   Nodes:  No cervical, supraclavicular, or axillary lymphadenopathy palpated.  Lungs: Diffuse expiratory wheezes bilaterally, greater on the left than the right. No Frank crackles or rhonchi.  Heart:  Regular rate and rhythm.   Abdomen:  Soft, nontender.  Positive bowel sounds.  No organomegaly or masses palpated.   Musculoskeletal:  No focal spinal tenderness to palpation.  Extremities:  Benign.  No peripheral edema or cyanosis.   Skin:  Benign.  Scattered ecchymoses noted on the upper extremities Neuro:  Nonfocal. Alert and oriented x3.    LAB RESULTS: Lab Results  Component Value Date   WBC 9.2 05/07/2012   NEUTROABS 5.8 05/07/2012   HGB 17.4* 05/07/2012   HCT 51.7* 05/07/2012   MCV 77.9* 05/07/2012   PLT 130* 05/07/2012      Chemistry      Component Value Date/Time   NA 139 05/01/2012 1016   K 5.0 05/01/2012 1016   CL 104 05/01/2012 1016   CO2 29 05/01/2012 1016   BUN 17 05/01/2012 1016   CREATININE 1.1 05/01/2012 1016      Component Value Date/Time   CALCIUM 9.5 05/01/2012 1016   ALKPHOS 75 03/27/2012 0249   AST 11 03/27/2012 0249   ALT 8 03/27/2012 0249   BILITOT 0.5 03/27/2012 0249        STUDIES: No results found.   ASSESSMENT: 76 year old [place] man with a history of polycythemia vera treated with phlebotomy, last phlebotomized in July 2012.  The goal is to phlebotomize once Mr.  Klemann hematocrit is 50 or greater.  At that point, we phlebotomize until the hematocrit is at 40 or less.  Several comorbidities including community-acquired pneumonia involving the right lower lobe, history of recurrent deep vein thrombosis, history of tobacco abuse with chronic bronchitis, history of  CVA and more recently history of STEMI and CHF, s/p defibrillator placement   PLAN: This case was reviewed with Dr. Darnelle Catalan, and per our previous plans, Mr Delcid will now initiate weekly phlebotomy treatments. Her goal will be to phlebotomize weekly until the hematocrit is 40 or less. I will plan on seeing him for brief followup in 3-4 weeks. Otherwise, he knows to call with any changes or problems.   Dick Hark    05/07/2012

## 2012-05-08 ENCOUNTER — Telehealth: Payer: Self-pay | Admitting: *Deleted

## 2012-05-08 NOTE — Telephone Encounter (Signed)
Per staff message I have scheduled appts. JMW  

## 2012-05-14 ENCOUNTER — Other Ambulatory Visit (HOSPITAL_BASED_OUTPATIENT_CLINIC_OR_DEPARTMENT_OTHER): Payer: Medicare Other | Admitting: Lab

## 2012-05-14 ENCOUNTER — Encounter: Payer: Self-pay | Admitting: *Deleted

## 2012-05-14 DIAGNOSIS — D45 Polycythemia vera: Secondary | ICD-10-CM

## 2012-05-14 LAB — CBC WITH DIFFERENTIAL/PLATELET
BASO%: 0.2 % (ref 0.0–2.0)
EOS%: 3 % (ref 0.0–7.0)
HCT: 48.1 % (ref 38.4–49.9)
LYMPH%: 21.2 % (ref 14.0–49.0)
MCH: 25.8 pg — ABNORMAL LOW (ref 27.2–33.4)
MCHC: 33.3 g/dL (ref 32.0–36.0)
NEUT%: 67.1 % (ref 39.0–75.0)
Platelets: 133 10*3/uL — ABNORMAL LOW (ref 140–400)
lymph#: 2 10*3/uL (ref 0.9–3.3)

## 2012-05-21 ENCOUNTER — Other Ambulatory Visit (HOSPITAL_BASED_OUTPATIENT_CLINIC_OR_DEPARTMENT_OTHER): Payer: Medicare Other | Admitting: Lab

## 2012-05-21 ENCOUNTER — Ambulatory Visit (HOSPITAL_BASED_OUTPATIENT_CLINIC_OR_DEPARTMENT_OTHER): Payer: Medicare Other

## 2012-05-21 ENCOUNTER — Other Ambulatory Visit: Payer: Self-pay | Admitting: *Deleted

## 2012-05-21 DIAGNOSIS — D45 Polycythemia vera: Secondary | ICD-10-CM

## 2012-05-21 LAB — CBC WITH DIFFERENTIAL/PLATELET
EOS%: 4.9 % (ref 0.0–7.0)
MCH: 26.1 pg — ABNORMAL LOW (ref 27.2–33.4)
MCHC: 32.7 g/dL (ref 32.0–36.0)
MCV: 79.7 fL (ref 79.3–98.0)
MONO%: 8.6 % (ref 0.0–14.0)
RBC: 6 10*6/uL — ABNORMAL HIGH (ref 4.20–5.82)
RDW: 19.4 % — ABNORMAL HIGH (ref 11.0–14.6)

## 2012-05-21 NOTE — Progress Notes (Signed)
1453- Therapeutic phlebotomy completed.  539 grams blood removed.  Pt tolerated procedure well.  Nourishments provided afterwards-dhp, rn

## 2012-05-21 NOTE — Patient Instructions (Addendum)
Therapeutic Phlebotomy Therapeutic phlebotomy is the controlled removal of blood from your body for the purpose of treating a medical condition. It is similar to donating blood. Usually, about a pint (470 mL) of blood is removed. The average adult has 9 to 12 pints (4.3 to 5.7 L) of blood. Therapeutic phlebotomy may be used to treat the following medical conditions:  Hemochromatosis. This is a condition in which there is too much iron in the blood.   Polycythemia vera. This is a condition in which there are too many red cells in the blood.   Porphyria cutanea tarda. This is a disease usually passed from one generation to the next (inherited). It is a condition in which an important part of hemoglobin is not made properly. This results in the build up of abnormal amounts of porphyrins in the body.   Sickle cell disease. This is an inherited disease. It is a condition in which the red blood cells form an abnormal crescent shape rather than a round shape.  LET YOUR CAREGIVER KNOW ABOUT:  Allergies.   Medicines taken including herbs, eyedrops, over-the-counter medicines, and creams.   Use of steroids (by mouth or creams).   Previous problems with anesthetics or numbing medicine.   History of blood clots.   History of bleeding or blood problems.   Previous surgery.   Possibility of pregnancy, if this applies.  RISKS AND COMPLICATIONS This is a simple and safe procedure. Problems are unlikely. However, problems can occur and may include:  Nausea or lightheadedness.   Low blood pressure.   Soreness, bleeding, swelling, or bruising at the needle insertion site.   Infection.  BEFORE THE PROCEDURE  This is a procedure that can be done as an outpatient. Confirm the time that you need to arrive for your procedure. Confirm whether there is a need to fast or withhold any medications. It is helpful to wear clothing with sleeves that can be raised above the elbow. A blood sample may be done  to determine the amount of red blood cells or iron in your blood. Plan ahead of time to have someone drive you home after the procedure. PROCEDURE The entire procedure from preparation through recovery takes about 1 hour. The actual collection takes about 10 to 15 minutes.  A needle will be inserted into your vein.   Tubing and a collection bag will be attached to that needle.   Blood will flow through the needle and tubing into the collection bag.   You may be asked to open and close your hand slowly and continuously during the entire collection.   Once the specified amount of blood has been removed from your body, the collection bag and tubing will be clamped.   The needle will be removed.   Pressure will be held on the site of the needle insertion to stop the bleeding. Then a bandage will be placed over the needle insertion site.  AFTER THE PROCEDURE  Your recovery will be assessed and monitored. If there are no problems, as an outpatient, you should be able to go home shortly after the procedure.  Document Released: 03/28/2011 Document Revised: 10/13/2011 Document Reviewed: 03/28/2011 [employer] Patient Information 2012 [employer]. 

## 2012-05-28 ENCOUNTER — Other Ambulatory Visit (HOSPITAL_BASED_OUTPATIENT_CLINIC_OR_DEPARTMENT_OTHER): Payer: Medicare Other | Admitting: Lab

## 2012-05-28 ENCOUNTER — Ambulatory Visit (HOSPITAL_BASED_OUTPATIENT_CLINIC_OR_DEPARTMENT_OTHER): Payer: Medicare Other

## 2012-05-28 ENCOUNTER — Telehealth: Payer: Self-pay | Admitting: *Deleted

## 2012-05-28 ENCOUNTER — Other Ambulatory Visit: Payer: Self-pay | Admitting: *Deleted

## 2012-05-28 ENCOUNTER — Encounter: Payer: Self-pay | Admitting: Physician Assistant

## 2012-05-28 ENCOUNTER — Telehealth: Payer: Self-pay | Admitting: Oncology

## 2012-05-28 ENCOUNTER — Other Ambulatory Visit: Payer: Self-pay | Admitting: Emergency Medicine

## 2012-05-28 ENCOUNTER — Ambulatory Visit (HOSPITAL_BASED_OUTPATIENT_CLINIC_OR_DEPARTMENT_OTHER): Payer: Medicare Other | Admitting: Physician Assistant

## 2012-05-28 VITALS — BP 137/82 | HR 72 | Temp 97.8°F | Ht 72.0 in | Wt 193.1 lb

## 2012-05-28 VITALS — BP 135/78 | HR 72

## 2012-05-28 DIAGNOSIS — D45 Polycythemia vera: Secondary | ICD-10-CM

## 2012-05-28 LAB — CBC WITH DIFFERENTIAL/PLATELET
Eosinophils Absolute: 0.5 10*3/uL (ref 0.0–0.5)
MCV: 78.4 fL — ABNORMAL LOW (ref 79.3–98.0)
MONO#: 0.7 10*3/uL (ref 0.1–0.9)
MONO%: 8.9 % (ref 0.0–14.0)
NEUT#: 4.9 10*3/uL (ref 1.5–6.5)
RBC: 5.65 10*6/uL (ref 4.20–5.82)
RDW: 18.7 % — ABNORMAL HIGH (ref 11.0–14.6)
WBC: 8.4 10*3/uL (ref 4.0–10.3)
nRBC: 0 % (ref 0–0)

## 2012-05-28 NOTE — Patient Instructions (Signed)
Therapeutic Phlebotomy Care After Refer to this sheet in the next few weeks. These instructions provide you with information on caring for yourself after your procedure. Your caregiver may also give you more specific instructions. Your treatment has been planned according to current medical practices, but problems sometimes occur. Call your caregiver if you have any problems or questions after your procedure. HOME CARE INSTRUCTIONS Most people can go back to their normal activities right away. Before you leave, be sure to ask if there is anything you should or should not do. In general, it would be wise to:  Keep the bandage dry. You can remove the bandage after about 5 hours.   Eat well-balanced meals for the next 24 hours.   Drink enough fluids to keep your urine clear or pale yellow.   Avoid drinking alcohol minimally until after eating.   Avoid smoking for at least 30 minutes after the procedure.   Avoid strenous physical activity or heavy lifting or pulling for about 5 hours after the procedure.   Athletes should avoid strenous exercise for 12 hours or more.   Change positions slowly for the remainder of the day to prevent lightheadedness or fainting.   If you feel lightheaded, lie down until the feeling subsides.   If you have bleeding from the needle insertion site, elevate your arm and press firmly on the site until the bleeding stops.   If bruising or bleeding appears under the skin, apply ice to the area for 15 to 20 minutes, 3 to 4 times per day. Put the ice in a plastic bag and place a towel between the bag of ice and your skin. Do this while you are awake for the first 24 hours. The ice packs can be stopped before 24 hours if the swelling goes away. If swelling persists after 24 hours, a warm, moist washcloth can be applied to the area for 15 to 20 minutes, 3 to 4 times per day. The warm, moist treatments can be stopped when the swelling goes away.   It is important to  continue further therapeutic phlebotomy as directed by your caregiver.  SEEK MEDICAL CARE IF:  There is bleeding or fluid leaking from the needle insertion site.   The needle insertion site becomes swollen, red, or sore.   You feel lightheaded, dizzy or nauseated, and the feeling does not go away.   You notice new bruising at the needle insertion site.   You feel more weak or tired than normal.   You develop a fever.  SEEK IMMEDIATE MEDICAL CARE IF:   There is increased bleeding, pain, or swelling from the needle insertion site.   You have severe nausea or vomiting.   You have chest pain.   You have trouble breathing.  MAKE SURE YOU:  Understand these instructions.   Will watch your condition.   Will get help right away if you are not doing well or get worse.  Document Released: 03/28/2011 Document Revised: 10/13/2011 Document Reviewed: 03/28/2011 [facility] Patient Information 2012 [place], [place].  Call [facility] [phone] with further questions or call 911 and go to the nearest emergency facility if you would require immediate assistance.

## 2012-05-28 NOTE — Telephone Encounter (Signed)
Per staff message I have scheduled appts. JMW  

## 2012-05-28 NOTE — Telephone Encounter (Signed)
The pt is aware to pick up a calendar or he will get one from [facility]

## 2012-05-28 NOTE — Progress Notes (Signed)
ID: Jesus Sanders   DOB: 1936-10-21  MR#: [id number]  NWG#:[id number]  HISTORY OF PRESENT ILLNESS:   Patient has a long-standing history of polycythemia vera, periodically requiring weekly therapeutic phlebotomy. This was diagnosed greater than 6 years ago. He also has multiple comorbidities which include COPD, multiple cardiac problems, history of DVT, and history of community-acquired pneumonia.   INTERVAL HISTORY: Jesus Sanders returns today  for followup of his polycythemia vera. The interval history is remarkable for [name] having initiated weekly phlebotomy treatments in early July. He is tolerating the phlebotomy well, with no associated side effects. His energy level is fairly good.  REVIEW OF SYSTEMS:  Unfortunately he continues to smoke, but is no longer smoking cigarettes. He is smoking, instead, 8-10 cigars a day. Of course he understands that this is still smoking, and at that cigars are harmful as well.  Tells me today that his biggest complaint is being "impatient". Mr Bougie He denies any recent fevers or chills. No nausea or change in bowel habits. No skin changes or rashes. No abnormal bleeding. He has shortness of breath, especially with exertion, but denies any significant cough or phlegm production. No chest pain or pressure. No abnormal headaches or pain elsewhere. No peripheral swelling.  A detailed review of systems is otherwise stable.  PAST MEDICAL HISTORY: Past Medical History  Diagnosis Date  . Polycythemia vera     [facility]  will restart his phlebotomies from about Mar 15 2011  . Stomach ulcer   . STEMI (ST elevation myocardial infarction) Sept 2012    Larger anterior apical MI with BMS to LAD with EF of 25 to 30%  . Tobacco abuse     stopped in April of 2012  . Duodenal perforation June 2012  . Peritonitis June 2012  . S/P cardiac cath Sept 2012    BMS to LAD  . Pneumonia April 2012  . LV dysfunction  Sept 2012    EF is 25%  . Systolic CHF, chronic   . GERD (gastroesophageal reflux disease)   . Ischemic cardiomyopathy   . Hypertension   . History of DVT (deep vein thrombosis)   . Chronic bronchitis     PAST SURGICAL HISTORY: Past Surgical History  Procedure Date  . Cholecystectomy 03/2011    Dr. Carolynne Edouard  . Colon surgery   . Cardiac catheterization Sept 2012    Normal left main, occluded LAD, 40% LCX and normal RCA. EF is 30%  . US echocardiography Sept 2012    EF 25 to 30% with akinesis of the mid to distal anterior and apical myocardium, trivial AI and no apical thrombus  . Cardiac defibrillator placement     single chamber  . Shoulder arthroscopy     left, rotatotor cuff tendinopathy    FAMILY HISTORY Family History  Problem Relation Age of Onset  . Lung disease Father     SOCIAL HISTORY:    ADVANCED DIRECTIVES: Living will in place  HEALTH MAINTENANCE: History  Substance Use Topics  . Smoking status: Current Everyday Smoker    Types: Cigars    Last Attempt to Quit: 03/04/2011  . Smokeless tobacco: Not on file   Comment: Quit smoking in April 2012. About 60-pack-year history of smoking  . Alcohol Use: No     socially   No Known Allergies  Current Outpatient Prescriptions  Medication Sig Dispense Refill  . albuterol (PROVENTIL HFA;VENTOLIN HFA) 108 (90 BASE) MCG/ACT inhaler  Inhale 2 puffs into the lungs every 4 (four) hours as needed for wheezing or shortness of breath.  1 Inhaler  4  . aspirin EC 81 MG tablet Take 81 mg by mouth daily.      Marland Kitchen atorvastatin (LIPITOR) 80 MG tablet TAKE 1 TABLET AT BEDTIME  30 tablet  6  . carvedilol (COREG) 25 MG tablet Take 1 tablet (25 mg total) by mouth 2 (two) times daily.  60 tablet  11  . Chlorpheniramine Maleate (ALLERGY RELIEF PO) Take 1 tablet by mouth daily.       . fluticasone (FLONASE) 50 MCG/ACT nasal spray Place 2 sprays into the nose daily. 2 sprays into each nostril daily.  16 g  2  . Fluticasone-Salmeterol  (ADVAIR DISKUS) 250-50 MCG/DOSE AEPB Inhale 1 puff into the lungs 2 (two) times daily.  60 each  1  . furosemide (LASIX) 20 MG tablet Take 20 mg by mouth daily as needed. Use as needed for swelling or for weight gain of 2 to 3 pounds overnight.      Marland Kitchen lisinopril (PRINIVIL,ZESTRIL) 20 MG tablet Take 20 mg by mouth 2 (two) times daily.      Marland Kitchen loratadine (CLARITIN) 10 MG tablet Take 10 mg by mouth daily.      . pantoprazole (PROTONIX) 40 MG tablet Take 40 mg by mouth daily.        Marland Kitchen spironolactone (ALDACTONE) 25 MG tablet Take one tablet by mouth once daily      . DISCONTD: lisinopril (PRINIVIL,ZESTRIL) 10 MG tablet Take 1 tablet (10 mg total) by mouth 2 (two) times daily.  60 tablet  11    OBJECTIVE: Elderly white male who appears comfortable and in no acute distress. Filed Vitals:   05/28/12 1423  BP: 137/82  Pulse: 72  Temp: 97.8 F (36.6 C)     Body mass index is 26.19 kg/(m^2).    ECOG FS: 1  Filed Weights   05/28/12 1423  Weight: 193 lb 1.6 oz (87.59 kg)   Physical Exam: HEENT:  Sclerae anicteric.  Conjunctiva pink. Lungs: Diffuse expiratory wheezes bilaterally, greater on the left than the right. No Frank crackles or rhonchi.  Heart:  Regular rate and rhythm.   Abdomen:  Soft, nontender.  Positive bowel sounds.  No organomegaly or masses palpated.   Musculoskeletal:  No focal spinal tenderness to palpation.  Extremities:  Benign.  No peripheral edema or cyanosis.   Skin:  Benign.  Scattered ecchymoses noted on the upper extremities Neuro:  Nonfocal. Alert and oriented x3.    LAB RESULTS: Lab Results  Component Value Date   WBC 8.4 05/28/2012   NEUTROABS 4.9 05/28/2012   HGB 14.6 05/28/2012   HCT 44.3 05/28/2012   MCV 78.4* 05/28/2012   PLT 139* 05/28/2012      Chemistry      Component Value Date/Time   NA 139 05/01/2012 1016   K 5.0 05/01/2012 1016   CL 104 05/01/2012 1016   CO2 29 05/01/2012 1016   BUN 17 05/01/2012 1016   CREATININE 1.1 05/01/2012 1016      Component  Value Date/Time   CALCIUM 9.5 05/01/2012 1016   ALKPHOS 75 03/27/2012 0249   AST 11 03/27/2012 0249   ALT 8 03/27/2012 0249   BILITOT 0.5 03/27/2012 0249        STUDIES: No results found.   ASSESSMENT: 76 year old [place] man with   (1)  a history of polycythemia vera treated with phlebotomy, last phlebotomized in  July 2012.  The goal is to phlebotomize once Mr. Baer hematocrit is 50 or greater.  At that point, we phlebotomize until the hematocrit is at 40 or less.    (2)  Several comorbidities including community-acquired pneumonia involving the right lower lobe, history of recurrent deep vein thrombosis, history of tobacco abuse with chronic bronchitis, history of CVA and more recently history of STEMI and CHF, s/p defibrillator placement   PLAN: This case was reviewed with Dr. Darnelle Catalan, and per our previous plans, Mr Carandang will continue with weekly phlebotomy treatments which he is tolerating well with positive results thus far. Our goal is to phlebotomize weekly until the hematocrit is 40 or less, and so far it has decreased from 51.7 on 05/07/2012 44.3 today.  He'll return in 4 weeks for followup with Dr. Darnelle Catalan on August 19. He knows to call with any changes or problems prior to that appointment.  Dora Simeone    05/28/2012

## 2012-05-29 ENCOUNTER — Telehealth: Payer: Self-pay | Admitting: Oncology

## 2012-05-29 NOTE — Telephone Encounter (Signed)
lmonvm of the pt advising him to pick up his aug appt schedule when he comes in for his July appts

## 2012-06-04 ENCOUNTER — Ambulatory Visit (INDEPENDENT_AMBULATORY_CARE_PROVIDER_SITE_OTHER): Payer: Medicare Other | Admitting: Internal Medicine

## 2012-06-04 ENCOUNTER — Other Ambulatory Visit: Payer: Self-pay | Admitting: *Deleted

## 2012-06-04 ENCOUNTER — Other Ambulatory Visit (HOSPITAL_BASED_OUTPATIENT_CLINIC_OR_DEPARTMENT_OTHER): Payer: Medicare Other | Admitting: Lab

## 2012-06-04 ENCOUNTER — Encounter: Payer: Self-pay | Admitting: Internal Medicine

## 2012-06-04 VITALS — BP 132/82 | HR 76 | Temp 98.5°F | Ht 72.0 in | Wt 191.8 lb

## 2012-06-04 DIAGNOSIS — D45 Polycythemia vera: Secondary | ICD-10-CM

## 2012-06-04 DIAGNOSIS — J441 Chronic obstructive pulmonary disease with (acute) exacerbation: Secondary | ICD-10-CM

## 2012-06-04 DIAGNOSIS — J189 Pneumonia, unspecified organism: Secondary | ICD-10-CM

## 2012-06-04 DIAGNOSIS — I2510 Atherosclerotic heart disease of native coronary artery without angina pectoris: Secondary | ICD-10-CM

## 2012-06-04 DIAGNOSIS — I5022 Chronic systolic (congestive) heart failure: Secondary | ICD-10-CM

## 2012-06-04 LAB — CBC WITH DIFFERENTIAL/PLATELET
BASO%: 0.4 % (ref 0.0–2.0)
HCT: 41.6 % (ref 38.4–49.9)
MCHC: 32.5 g/dL (ref 32.0–36.0)
MONO#: 0.6 10*3/uL (ref 0.1–0.9)
NEUT%: 66.8 % (ref 39.0–75.0)
RDW: 18 % — ABNORMAL HIGH (ref 11.0–14.6)
WBC: 8.1 10*3/uL (ref 4.0–10.3)
lymph#: 1.6 10*3/uL (ref 0.9–3.3)
nRBC: 0 % (ref 0–0)

## 2012-06-04 NOTE — Progress Notes (Signed)
Pt waited for 30 minutes after scheduled appointment time, then left without being seen  We will not reschedule - his request, and mutually agreed upon by staff due to his behavior while in office today  No charge

## 2012-06-10 ENCOUNTER — Other Ambulatory Visit: Payer: Self-pay | Admitting: *Deleted

## 2012-06-11 ENCOUNTER — Other Ambulatory Visit: Payer: Medicare Other | Admitting: Lab

## 2012-06-18 ENCOUNTER — Other Ambulatory Visit: Payer: Medicare Other | Admitting: Lab

## 2012-06-25 ENCOUNTER — Telehealth: Payer: Self-pay | Admitting: *Deleted

## 2012-06-25 ENCOUNTER — Other Ambulatory Visit (HOSPITAL_BASED_OUTPATIENT_CLINIC_OR_DEPARTMENT_OTHER): Payer: Medicare Other | Admitting: Lab

## 2012-06-25 ENCOUNTER — Other Ambulatory Visit: Payer: Self-pay | Admitting: Internal Medicine

## 2012-06-25 ENCOUNTER — Ambulatory Visit (HOSPITAL_BASED_OUTPATIENT_CLINIC_OR_DEPARTMENT_OTHER): Payer: Medicare Other | Admitting: Oncology

## 2012-06-25 ENCOUNTER — Ambulatory Visit (HOSPITAL_BASED_OUTPATIENT_CLINIC_OR_DEPARTMENT_OTHER): Payer: Medicare Other

## 2012-06-25 VITALS — BP 162/91 | HR 77 | Temp 98.0°F | Resp 20 | Ht 72.0 in | Wt 193.1 lb

## 2012-06-25 VITALS — BP 146/87 | HR 70

## 2012-06-25 DIAGNOSIS — Z86718 Personal history of other venous thrombosis and embolism: Secondary | ICD-10-CM

## 2012-06-25 DIAGNOSIS — D45 Polycythemia vera: Secondary | ICD-10-CM

## 2012-06-25 LAB — CBC WITH DIFFERENTIAL/PLATELET
Basophils Absolute: 0 10*3/uL (ref 0.0–0.1)
Eosinophils Absolute: 0.4 10*3/uL (ref 0.0–0.5)
HGB: 14.5 g/dL (ref 13.0–17.1)
MCV: 79.1 fL — ABNORMAL LOW (ref 79.3–98.0)
MONO#: 0.8 10*3/uL (ref 0.1–0.9)
NEUT#: 6.3 10*3/uL (ref 1.5–6.5)
RDW: 17 % — ABNORMAL HIGH (ref 11.0–14.6)
WBC: 9.6 10*3/uL (ref 4.0–10.3)
lymph#: 2 10*3/uL (ref 0.9–3.3)
nRBC: 0 % (ref 0–0)

## 2012-06-25 MED ORDER — HYDROXYUREA 500 MG PO CAPS
500.0000 mg | ORAL_CAPSULE | Freq: Every day | ORAL | Status: DC
Start: 2012-06-25 — End: 2012-06-28

## 2012-06-25 NOTE — Patient Instructions (Addendum)

## 2012-06-25 NOTE — Telephone Encounter (Signed)
Per POF and staff message I have scheduled appts. JMW  

## 2012-06-25 NOTE — Progress Notes (Signed)
ID: Jesus Sanders   DOB: 15-Dec-1935  MR#: [id number]  JYN#:[id number]  HISTORY OF PRESENT ILLNESS:   Patient has a long-standing history of polycythemia vera, periodically requiring weekly therapeutic phlebotomy. This was diagnosed greater than 6 years ago. He also has multiple comorbidities which include COPD, multiple cardiac problems, history of DVT, and history of community-acquired pneumonia.   INTERVAL HISTORY: Jesus Sanders returns today with his daughter Jesus Sanders for followup of his polycythemia vera. He is tolerating his phlebotomies without any unusual complications.  REVIEW OF SYSTEMS:  He continues to smoke, but he says he only smokes cigars. He feels tired, and when he goes for a walk yesterday. Several times because of shortness of breath. He denies a cough though. He has some morning phlegm every day, but no hemoptysis. A detailed review of systems was otherwise stable.  PAST MEDICAL HISTORY: Past Medical History  Diagnosis Date  . Polycythemia vera     [facility]  will restart his phlebotomies from about Mar 15 2011  . Stomach ulcer   . STEMI (ST elevation myocardial infarction) Sept 2012    Larger anterior apical MI with BMS to LAD with EF of 25 to 30%  . Tobacco abuse     stopped in April of 2012  . Duodenal perforation June 2012  . Peritonitis June 2012  . S/P cardiac cath Sept 2012    BMS to LAD  . Pneumonia April 2012  . LV dysfunction Sept 2012    EF is 25%  . Systolic CHF, chronic   . GERD (gastroesophageal reflux disease)   . Ischemic cardiomyopathy   . Hypertension   . History of DVT (deep vein thrombosis)   . Chronic bronchitis   . Acute stomach ulcer 07/07/2011  . Bruises easily 05/03/2011  . CAD (coronary artery disease) 08/02/2011    Anterior apical STEMI in September of 2012 treated with BMS to the LAD. EF is 25%.    Marland Kitchen HTN (hypertension) 09/06/2011  . ICD-Medtronic 04/11/2012    PAST SURGICAL  HISTORY: Past Surgical History  Procedure Date  . Cholecystectomy 03/2011    Dr. Carolynne Edouard  . Colon surgery   . Cardiac catheterization Sept 2012    Normal left main, occluded LAD, 40% LCX and normal RCA. EF is 30%  . US echocardiography Sept 2012    EF 25 to 30% with akinesis of the mid to distal anterior and apical myocardium, trivial AI and no apical thrombus  . Cardiac defibrillator placement     single chamber  . Shoulder arthroscopy     left, rotatotor cuff tendinopathy    FAMILY HISTORY Family History  Problem Relation Age of Onset  . Lung disease Father     SOCIAL HISTORY:    ADVANCED DIRECTIVES: Living will in place  HEALTH MAINTENANCE: History  Substance Use Topics  . Smoking status: Current Everyday Smoker    Types: Cigars    Last Attempt to Quit: 03/04/2011  . Smokeless tobacco: Not on file   Comment: Quit smoking in April 2012. About 60-pack-year history of smoking  . Alcohol Use: No     socially   No Known Allergies  Current Outpatient Prescriptions  Medication Sig Dispense Refill  . albuterol (PROVENTIL HFA;VENTOLIN HFA) 108 (90 BASE) MCG/ACT inhaler Inhale 2 puffs into the lungs every 4 (four) hours as needed for wheezing or shortness of breath.  1 Inhaler  4  . aspirin EC 81 MG tablet  Take 81 mg by mouth daily.      Marland Kitchen atorvastatin (LIPITOR) 80 MG tablet TAKE 1 TABLET AT BEDTIME  30 tablet  6  . carvedilol (COREG) 25 MG tablet Take 1 tablet (25 mg total) by mouth 2 (two) times daily.  60 tablet  11  . Chlorpheniramine Maleate (ALLERGY RELIEF PO) Take 1 tablet by mouth daily.       . furosemide (LASIX) 20 MG tablet Take 20 mg by mouth daily as needed. Use as needed for swelling or for weight gain of 2 to 3 pounds overnight.      Marland Kitchen lisinopril (PRINIVIL,ZESTRIL) 20 MG tablet Take 20 mg by mouth 2 (two) times daily.      Marland Kitchen loratadine (CLARITIN) 10 MG tablet Take 10 mg by mouth daily.      . pantoprazole (PROTONIX) 40 MG tablet Take 40 mg by mouth daily.          Marland Kitchen spironolactone (ALDACTONE) 25 MG tablet Take one tablet by mouth once daily      . fluticasone (FLONASE) 50 MCG/ACT nasal spray Place 2 sprays into the nose daily. 2 sprays into each nostril daily.  16 g  2  . DISCONTD: lisinopril (PRINIVIL,ZESTRIL) 10 MG tablet Take 1 tablet (10 mg total) by mouth 2 (two) times daily.  60 tablet  11    OBJECTIVE: Elderly white male in no acute distress. Filed Vitals:   06/25/12 0856  BP: 162/91  Pulse: 77  Temp: 98 F (36.7 C)  Resp: 20     Body mass index is 26.19 kg/(m^2).    ECOG FS: 1  Filed Weights   06/25/12 0855 06/25/12 0856  Weight: 193 lb 1.6 oz (87.59 kg) 193 lb 1.6 oz (87.59 kg)   Physical Exam: HEENT:  Sclerae anicteric.  Oropharynx clear Lungs: Diffuse expiratory wheezes bilaterally, Heart:  Regular rate and rhythm.   Abdomen:  Soft, nontender.  Positive bowel sounds.  Musculoskeletal:  No focal spinal tenderness to palpation.  Extremities:  Benign.  No peripheral edema or cyanosis.   Neuro:  Nonfocal. Alert and oriented x3.    LAB RESULTS: Lab Results  Component Value Date   WBC 9.6 06/25/2012   NEUTROABS 6.3 06/25/2012   HGB 14.5 06/25/2012   HCT 45.4 06/25/2012   MCV 79.1* 06/25/2012   PLT 132* 06/25/2012      Chemistry      Component Value Date/Time   NA 139 05/01/2012 1016   K 5.0 05/01/2012 1016   CL 104 05/01/2012 1016   CO2 29 05/01/2012 1016   BUN 17 05/01/2012 1016   CREATININE 1.1 05/01/2012 1016      Component Value Date/Time   CALCIUM 9.5 05/01/2012 1016   ALKPHOS 75 03/27/2012 0249   AST 11 03/27/2012 0249   ALT 8 03/27/2012 0249   BILITOT 0.5 03/27/2012 0249        STUDIES: No results found.   ASSESSMENT: 76 year old [place] man with   (1)  a history of polycythemia vera treated with phlebotomy, last phlebotomized in July 2012.  The goal is to phlebotomize once Jesus Sanders hematocrit is 50 or greater.  At that point, we phlebotomize until the hematocrit is at 40 or less.    (2)  Several  comorbidities including community-acquired pneumonia involving the right lower lobe, history of recurrent deep vein thrombosis, history of tobacco abuse with chronic bronchitis, history of CVA and more recently history of STEMI and CHF, s/p defibrillator placement   PLAN: I  took the opportunity to review the pathophysiology of polycythemia vera with the patient, since his daughter was present today. They understand our goal is to try to get his hematocrit to 40 or so before stopping phlebotomy. Accordingly I have set him up for phlebotomy today and the next 2 Mondays. I'm hoping by then we will be close to goal. We also discussed Hydrea. The patient does not have funds to cover this medication and he does not have insurance to cover for medications. He will explore it because significant afforded he will start Hydrea 500 mg daily. He does have an understanding of the possible toxicities side effects and complications of this medication. He knows to call for any problems that may develop before the next visit  Jayziah Bankhead C    06/25/2012

## 2012-06-25 NOTE — Telephone Encounter (Signed)
Moved appt from 9/2 to 9/3 due to holiday. JMW 

## 2012-06-25 NOTE — Telephone Encounter (Signed)
GAVE PATIENT APPOINTMENT FOR 07-02-2012 AND 07-10-2012 SENT MICHELLE EMAIL TO SET UP PATIENT TREATMENTS

## 2012-06-25 NOTE — Progress Notes (Signed)
1478-295 grams removed from right ac without difficulty and complete at 0950.  Pt tolerated well.  Pressure dressing applied to phlebotomy site.  Pt ate snack and drink, but refused to stay for 30 minutes for observation post phlebotomy.  Pt. Discharged to home at 1010 without complaints.

## 2012-06-28 ENCOUNTER — Encounter: Payer: Self-pay | Admitting: Cardiology

## 2012-06-28 ENCOUNTER — Ambulatory Visit (INDEPENDENT_AMBULATORY_CARE_PROVIDER_SITE_OTHER): Payer: Medicare Other | Admitting: Cardiology

## 2012-06-28 VITALS — BP 140/80 | HR 86 | Ht 72.0 in | Wt 192.0 lb

## 2012-06-28 DIAGNOSIS — I255 Ischemic cardiomyopathy: Secondary | ICD-10-CM

## 2012-06-28 DIAGNOSIS — I2589 Other forms of chronic ischemic heart disease: Secondary | ICD-10-CM

## 2012-06-28 DIAGNOSIS — I509 Heart failure, unspecified: Secondary | ICD-10-CM

## 2012-06-28 DIAGNOSIS — R0602 Shortness of breath: Secondary | ICD-10-CM

## 2012-06-28 DIAGNOSIS — E785 Hyperlipidemia, unspecified: Secondary | ICD-10-CM

## 2012-06-28 DIAGNOSIS — I2510 Atherosclerotic heart disease of native coronary artery without angina pectoris: Secondary | ICD-10-CM

## 2012-06-28 MED ORDER — FUROSEMIDE 20 MG PO TABS: 20.0000 mg | ORAL_TABLET | Freq: Every day | ORAL | Status: DC

## 2012-06-28 NOTE — Patient Instructions (Signed)
Take Lasix 20 mg daily.  Continue your other medication  I will see you again in 4 months.

## 2012-06-28 NOTE — Progress Notes (Signed)
Jesus Sanders Date of Birth: Oct 03, 1936 Medical Record #[id number]  History of Present Illness: [name] is seen back today for a followup visit.  He had a larger anterior apical MI with BMS to the LAD in September 2012. He was felt to have presented late. EF is 25%. He underwent placement of a prophylactic ICD in early March. He denies any chest pain. He complains of shortness of breath with walking. If he walks a quarter of a mile he has to stop 3 times because of shortness of breath. He denies any cough or increased swelling. He does have some orthopnea and PND. He is very sedentary. He complains of he spits a lot of phlegm up. He denies any current cigarette use but does smoke multiple cigars a day.   Current Outpatient Prescriptions on File Prior to Visit  Medication Sig Dispense Refill  . albuterol (PROVENTIL HFA;VENTOLIN HFA) 108 (90 BASE) MCG/ACT inhaler Inhale 2 puffs into the lungs every 4 (four) hours as needed for wheezing or shortness of breath.  1 Inhaler  4  . aspirin EC 81 MG tablet Take 81 mg by mouth daily.      Marland Kitchen atorvastatin (LIPITOR) 80 MG tablet TAKE 1 TABLET AT BEDTIME  30 tablet  6  . carvedilol (COREG) 25 MG tablet Take 1 tablet (25 mg total) by mouth 2 (two) times daily.  60 tablet  11  . Chlorpheniramine Maleate (ALLERGY RELIEF PO) Take 1 tablet by mouth daily.       . fluticasone (FLONASE) 50 MCG/ACT nasal spray Place 2 sprays into the nose daily. 2 sprays into each nostril daily.  16 g  2  . lisinopril (PRINIVIL,ZESTRIL) 20 MG tablet Take 20 mg by mouth 2 (two) times daily.      Marland Kitchen loratadine (CLARITIN) 10 MG tablet Take 10 mg by mouth daily.      . pantoprazole (PROTONIX) 40 MG tablet Take 40 mg by mouth daily.        Marland Kitchen spironolactone (ALDACTONE) 25 MG tablet Take 1 tablet (25 mg total) by mouth daily. Take 1 tablet my mouth daily  30 tablet  5  . DISCONTD: furosemide (LASIX) 20 MG tablet Take 20 mg by mouth daily as needed. Use as needed for swelling or for  weight gain of 2 to 3 pounds overnight.      Marland Kitchen DISCONTD: lisinopril (PRINIVIL,ZESTRIL) 10 MG tablet Take 1 tablet (10 mg total) by mouth 2 (two) times daily.  60 tablet  11  . DISCONTD: spironolactone (ALDACTONE) 25 MG tablet Take one tablet by mouth once daily        No Known Allergies  Past Medical History  Diagnosis Date  . Polycythemia vera     [facility]  will restart his phlebotomies from about Mar 15 2011  . Stomach ulcer   . STEMI (ST elevation myocardial infarction) Sept 2012    Larger anterior apical MI with BMS to LAD with EF of 25 to 30%  . Tobacco abuse     cigars  . Duodenal perforation June 2012  . Peritonitis June 2012  . S/P cardiac cath Sept 2012    BMS to LAD  . Pneumonia April 2012  . LV dysfunction Sept 2012    EF is 25%  . Systolic CHF, chronic   . GERD (gastroesophageal reflux disease)   . Ischemic cardiomyopathy   . Hypertension   . History of DVT (deep  vein thrombosis)   . Chronic bronchitis   . Acute stomach ulcer 07/07/2011  . Bruises easily 05/03/2011  . CAD (coronary artery disease) 08/02/2011    Anterior apical STEMI in September of 2012 treated with BMS to the LAD. EF is 25%.    Marland Kitchen HTN (hypertension) 09/06/2011  . ICD-Medtronic 04/11/2012    Past Surgical History  Procedure Date  . Cholecystectomy 03/2011    Dr. Carolynne Edouard  . Colon surgery   . Cardiac catheterization Sept 2012    Normal left main, occluded LAD, 40% LCX and normal RCA. EF is 30%  . US echocardiography Sept 2012    EF 25 to 30% with akinesis of the mid to distal anterior and apical myocardium, trivial AI and no apical thrombus  . Cardiac defibrillator placement     single chamber  . Shoulder arthroscopy     left, rotatotor cuff tendinopathy    History  Smoking status  . Current Everyday Smoker  . Types: Cigars  . Last Attempt to Quit: 03/04/2011  Smokeless tobacco  . Not on file  Comment: Quit smoking in April  2012. About 60-pack-year history of smoking    History  Alcohol Use No    socially    Family History  Problem Relation Age of Onset  . Lung disease Father     Review of Systems: The review of systems is positive for periodic phlebotomy for polycythemia.   All other systems were reviewed and are negative.  Physical Exam: BP 140/80  Pulse 86  Ht 6' (1.829 m)  Wt 192 lb (87.091 kg)  BMI 26.04 kg/m2  SpO2 91% Patient is  pleasant and in no acute distress. Skin is warm and dry. Color is normal.  HEENT is unremarkable. Normocephalic/atraumatic. PERRL. Sclera are nonicteric. Neck is supple. No masses. No JVD. Lungs are coarse with some faint wheezes. Cardiac exam shows a regular rate and rhythm. Abdomen is soft. Extremities are without edema. Gait and ROM are intact. No gross neurologic deficits noted.  LABORATORY DATA:    Assessment / Plan: 1. Chronic systolic congestive heart failure. He has been taking Lasix on a when necessary basis only. I recommended that he take it daily to see if this will help with his shortness of breath. He is already optimized on his carvedilol, ACE inhibitor, and Aldactone.  2. Status post prophylactic ICD.  3. Hypertension, controlled.  4. Coronary disease status post anterior myocardial infarction in September of 2012. He had a bare-metal stent placed to the LAD but he was felt to presented late in his course. He has a result an ischemic cardiomyopathy. He had no other obstructive coronary disease.  5. Tobacco use. He now smokes cigars only. I've encouraged complete smoking cessation.

## 2012-07-02 ENCOUNTER — Ambulatory Visit (HOSPITAL_BASED_OUTPATIENT_CLINIC_OR_DEPARTMENT_OTHER): Payer: Medicare Other

## 2012-07-02 ENCOUNTER — Other Ambulatory Visit (HOSPITAL_BASED_OUTPATIENT_CLINIC_OR_DEPARTMENT_OTHER): Payer: Medicare Other

## 2012-07-02 VITALS — BP 127/77 | HR 73 | Temp 97.8°F

## 2012-07-02 DIAGNOSIS — D45 Polycythemia vera: Secondary | ICD-10-CM

## 2012-07-02 LAB — CBC WITH DIFFERENTIAL/PLATELET
Eosinophils Absolute: 0.4 10*3/uL (ref 0.0–0.5)
HCT: 42.6 % (ref 38.4–49.9)
LYMPH%: 20.3 % (ref 14.0–49.0)
MONO#: 1.1 10*3/uL — ABNORMAL HIGH (ref 0.1–0.9)
NEUT#: 5.6 10*3/uL (ref 1.5–6.5)
NEUT%: 63.1 % (ref 39.0–75.0)
Platelets: 161 10*3/uL (ref 140–400)
WBC: 9 10*3/uL (ref 4.0–10.3)
lymph#: 1.8 10*3/uL (ref 0.9–3.3)
nRBC: 0 % (ref 0–0)

## 2012-07-02 NOTE — Progress Notes (Signed)
Patient for phlebotomy today. #16 gauge needle inserted in right antecubital at 1335 and blood removed until 1340. 500 ml of blood removed. Patient tolerated procedure well, pressure dressing applied.

## 2012-07-02 NOTE — Patient Instructions (Addendum)
Patient discharged home without complaints.

## 2012-07-10 ENCOUNTER — Other Ambulatory Visit: Payer: Medicare Other | Admitting: Lab

## 2012-07-13 ENCOUNTER — Other Ambulatory Visit: Payer: Self-pay | Admitting: Physician Assistant

## 2012-07-13 ENCOUNTER — Telehealth: Payer: Self-pay | Admitting: *Deleted

## 2012-07-13 DIAGNOSIS — D45 Polycythemia vera: Secondary | ICD-10-CM

## 2012-07-13 NOTE — Telephone Encounter (Signed)
Per staff message and POF I have scheduled appts. JW  

## 2012-07-13 NOTE — Telephone Encounter (Signed)
Lab and phlebotomy 9/12, 9/19, and 9/26  Sent Jesus Sanders email to set up patients phlebotoy

## 2012-07-18 ENCOUNTER — Other Ambulatory Visit: Payer: Self-pay | Admitting: Internal Medicine

## 2012-07-19 ENCOUNTER — Other Ambulatory Visit (HOSPITAL_BASED_OUTPATIENT_CLINIC_OR_DEPARTMENT_OTHER): Payer: Medicare Other | Admitting: Lab

## 2012-07-19 ENCOUNTER — Ambulatory Visit: Payer: Medicare Other

## 2012-07-19 DIAGNOSIS — D45 Polycythemia vera: Secondary | ICD-10-CM

## 2012-07-19 LAB — CBC WITH DIFFERENTIAL/PLATELET
BASO%: 1 % (ref 0.0–2.0)
HCT: 41.5 % (ref 38.4–49.9)
LYMPH%: 19.4 % (ref 14.0–49.0)
MCH: 24.2 pg — ABNORMAL LOW (ref 27.2–33.4)
MCHC: 31.6 g/dL — ABNORMAL LOW (ref 32.0–36.0)
MCV: 76.8 fL — ABNORMAL LOW (ref 79.3–98.0)
MONO#: 1 10*3/uL — ABNORMAL HIGH (ref 0.1–0.9)
MONO%: 9.6 % (ref 0.0–14.0)
NEUT%: 66.1 % (ref 39.0–75.0)
Platelets: 165 10*3/uL (ref 140–400)
WBC: 10 10*3/uL (ref 4.0–10.3)

## 2012-07-19 IMAGING — CR DG CHEST 1V PORT
1 series · 1 of 1 positions shown · non-contrast
Comparison: Portable chest x-ray of 04/08/2011

CLINICAL DATA: Endotracheal tube placement

PORTABLE CHEST - 1 VIEW

[AP]
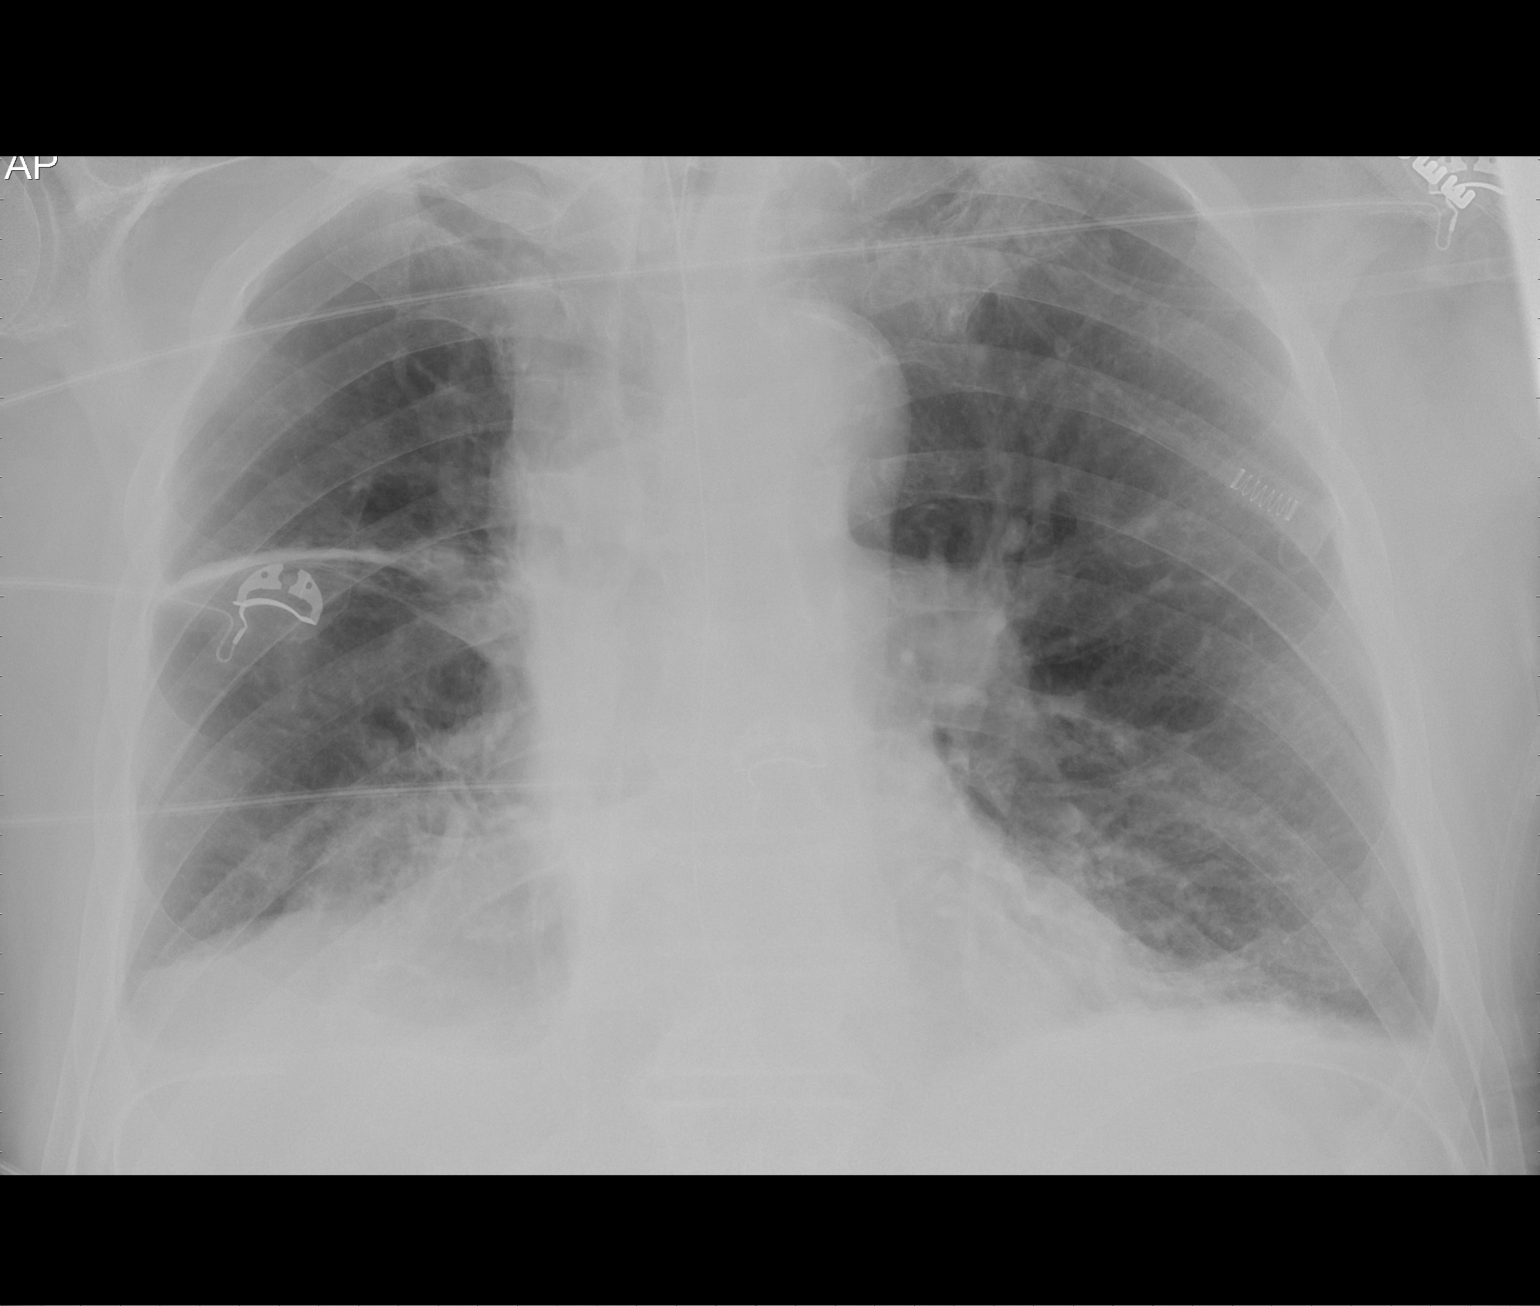

[1 of 1 positions shown; findings below may reference images not displayed]

FINDINGS: The tip of the endotracheal tube is approximately 8.2 cm
above the carina, with the carina difficult to visualize well.
Bibasilar opacities persist consistent with atelectasis or
pneumonia and there may be small effusions present.  Heart size is
stable.
IMPRESSION: Tip of endotracheal tube approximately 8.2 cm above carina.  Little
change in bibasilar opacities.

## 2012-07-19 NOTE — Progress Notes (Signed)
1400--Approx collected, accessed with 16G phlebotomy set. Tolerated well, IV site clotted, unable to manipulate to restart flow. Called and discussed with Zollie Scale, PA, she is ok not to stick patient again. We will recheck labs next week.

## 2012-07-20 IMAGING — CR DG CHEST 1V PORT
1 series · 1 of 1 positions shown · non-contrast
Comparison: 04/08/2011

CLINICAL DATA: Ventilatory support, respiratory failure

PORTABLE CHEST - 1 VIEW

[AP]
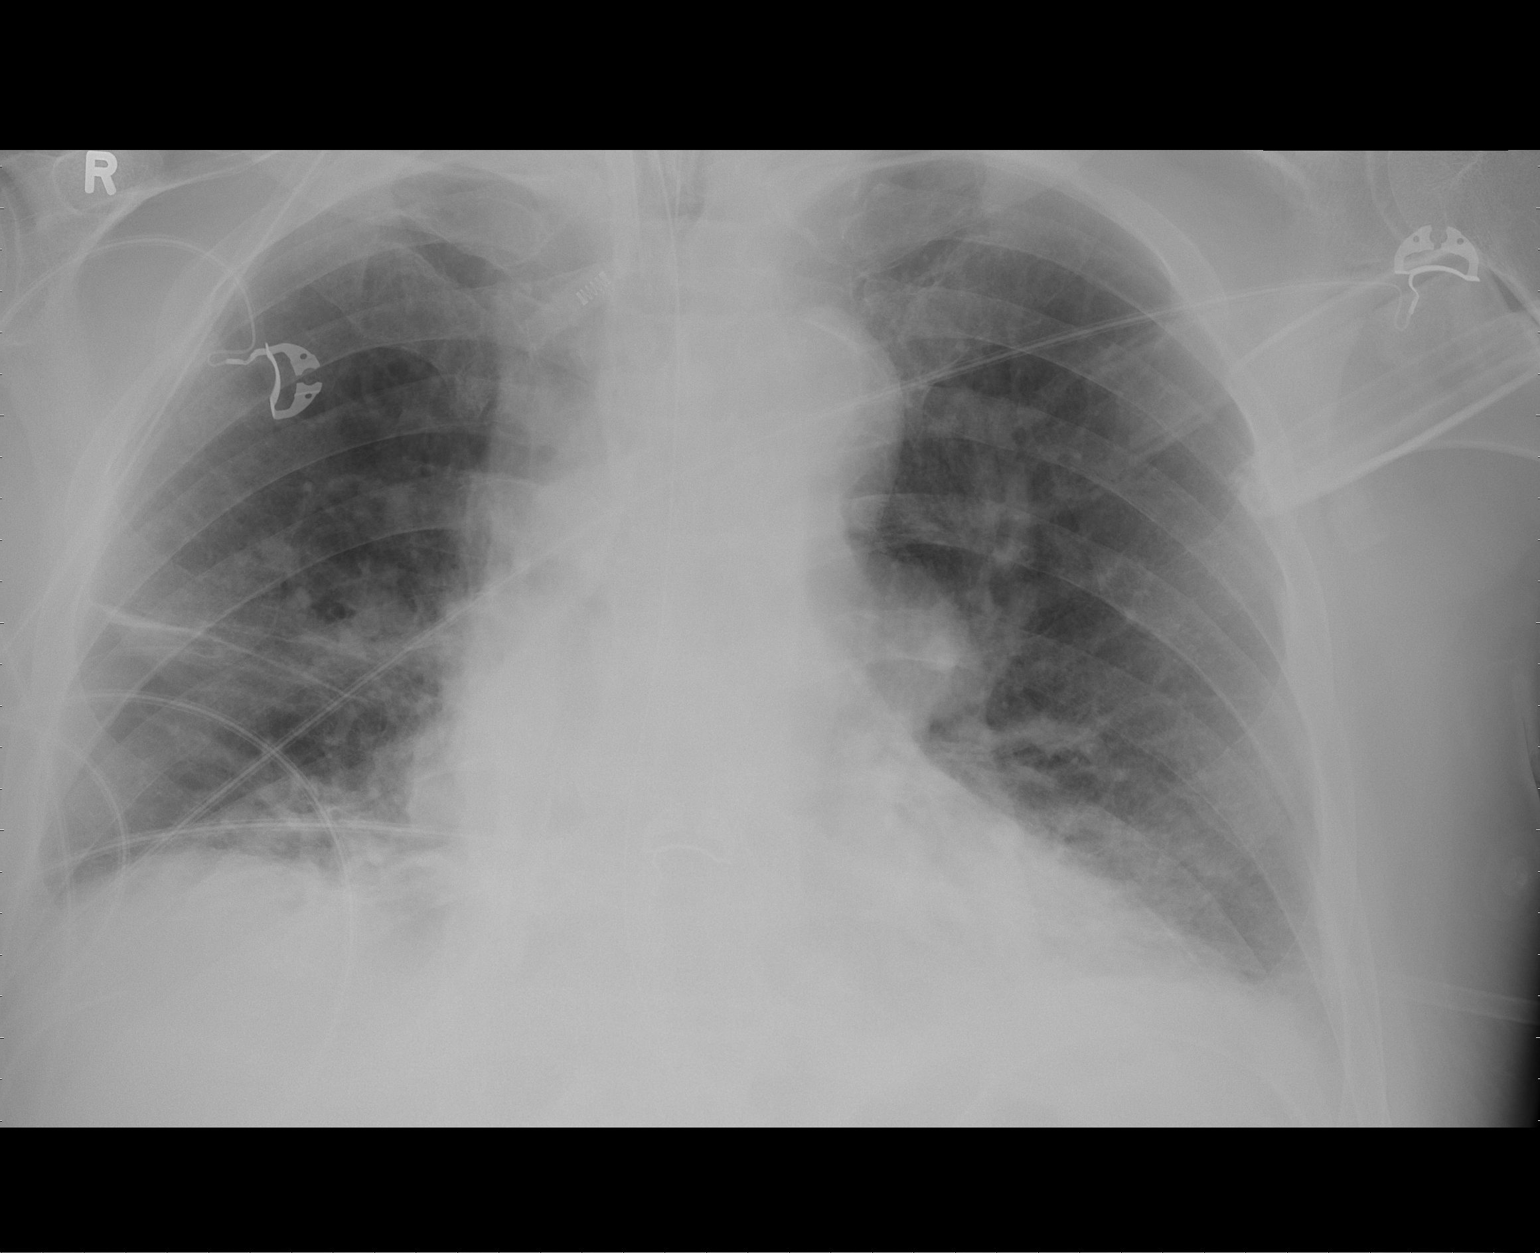

[1 of 1 positions shown; findings below may reference images not displayed]

FINDINGS: Stable support apparatus.  Endotracheal tube at the level
of the clavicular heads.  NG tube coiled in the stomach.
Persistent bibasilar atelectasis and right fissural scarring.  No
enlarging effusion or pneumothorax.
IMPRESSION: Stable portable chest exam.

## 2012-07-23 ENCOUNTER — Encounter: Payer: Medicare Other | Admitting: *Deleted

## 2012-07-26 ENCOUNTER — Other Ambulatory Visit: Payer: Medicare Other | Admitting: Lab

## 2012-07-30 ENCOUNTER — Telehealth: Payer: Self-pay | Admitting: Cardiology

## 2012-07-30 NOTE — Telephone Encounter (Signed)
Spoke to patient's daughter Jesus Sanders she stated this past Fri and Sat night when father layed down to go to bed he was sob.States she went to see father this morning and he rested well last night no sob.States he feels fine today.Jesus Sanders was told Dr.Jordan out of office today,will let Dr.Jordan know tomorrow 07/31/12.Advised to call back if needed.

## 2012-07-30 NOTE — Telephone Encounter (Signed)
plz return call to patient daughter Bjorn Loser [phone] regarding pt oxygen.

## 2012-07-31 NOTE — Telephone Encounter (Signed)
Spoke to patient's daughter Jesus Sanders was told Dr.Jordan advised next time patient has sob when he lies down he can take a extra lasix.Advised to call back if needed.

## 2012-08-02 ENCOUNTER — Other Ambulatory Visit: Payer: Medicare Other | Admitting: Lab

## 2012-08-03 ENCOUNTER — Encounter: Payer: Self-pay | Admitting: *Deleted

## 2012-08-06 ENCOUNTER — Other Ambulatory Visit: Payer: Medicare Other | Admitting: Lab

## 2012-08-08 ENCOUNTER — Encounter: Payer: Self-pay | Admitting: Internal Medicine

## 2012-08-08 ENCOUNTER — Ambulatory Visit (INDEPENDENT_AMBULATORY_CARE_PROVIDER_SITE_OTHER): Payer: Medicare Other | Admitting: *Deleted

## 2012-08-08 DIAGNOSIS — I2589 Other forms of chronic ischemic heart disease: Secondary | ICD-10-CM

## 2012-08-08 DIAGNOSIS — I255 Ischemic cardiomyopathy: Secondary | ICD-10-CM

## 2012-08-08 DIAGNOSIS — Z95810 Presence of automatic (implantable) cardiac defibrillator: Secondary | ICD-10-CM

## 2012-08-08 DIAGNOSIS — I5022 Chronic systolic (congestive) heart failure: Secondary | ICD-10-CM

## 2012-08-10 LAB — REMOTE ICD DEVICE
CHARGE TIME: 8.408 s
DEV-0020ICD: NEGATIVE
RV LEAD AMPLITUDE: 4.5 mv
RV LEAD IMPEDENCE ICD: 570 Ohm
RV LEAD THRESHOLD: 0.375 V
TOT-0001: 1
TOT-0002: 0
TZAT-0001FASTVT: 1
TZAT-0012SLOWVT: 200 ms
TZAT-0020SLOWVT: 1.5 ms
TZON-0003SLOWVT: 360 ms
TZON-0003VSLOWVT: 360 ms
TZON-0004SLOWVT: 16
TZST-0001FASTVT: 2
TZST-0001FASTVT: 4
TZST-0001SLOWVT: 2
TZST-0001SLOWVT: 3
TZST-0002FASTVT: NEGATIVE
TZST-0002FASTVT: NEGATIVE
TZST-0002SLOWVT: NEGATIVE
TZST-0002SLOWVT: NEGATIVE
TZST-0002SLOWVT: NEGATIVE
VF: 0

## 2012-08-21 ENCOUNTER — Encounter: Payer: Self-pay | Admitting: *Deleted

## 2012-09-03 ENCOUNTER — Other Ambulatory Visit: Payer: Medicare Other | Admitting: Lab

## 2012-10-01 ENCOUNTER — Other Ambulatory Visit: Payer: Medicare Other | Admitting: Lab

## 2012-10-09 ENCOUNTER — Other Ambulatory Visit: Payer: Self-pay

## 2012-10-09 ENCOUNTER — Encounter: Payer: Self-pay | Admitting: Cardiology

## 2012-10-09 ENCOUNTER — Other Ambulatory Visit (INDEPENDENT_AMBULATORY_CARE_PROVIDER_SITE_OTHER): Payer: Medicare Other

## 2012-10-09 ENCOUNTER — Ambulatory Visit: Payer: Medicare Other | Admitting: Cardiology

## 2012-10-09 ENCOUNTER — Ambulatory Visit (INDEPENDENT_AMBULATORY_CARE_PROVIDER_SITE_OTHER): Payer: Medicare Other | Admitting: Cardiology

## 2012-10-09 VITALS — BP 138/78 | HR 66 | Ht 72.0 in | Wt 193.4 lb

## 2012-10-09 DIAGNOSIS — I2510 Atherosclerotic heart disease of native coronary artery without angina pectoris: Secondary | ICD-10-CM

## 2012-10-09 DIAGNOSIS — Z95810 Presence of automatic (implantable) cardiac defibrillator: Secondary | ICD-10-CM

## 2012-10-09 DIAGNOSIS — I10 Essential (primary) hypertension: Secondary | ICD-10-CM

## 2012-10-09 DIAGNOSIS — I2589 Other forms of chronic ischemic heart disease: Secondary | ICD-10-CM

## 2012-10-09 DIAGNOSIS — I5022 Chronic systolic (congestive) heart failure: Secondary | ICD-10-CM

## 2012-10-09 DIAGNOSIS — I255 Ischemic cardiomyopathy: Secondary | ICD-10-CM

## 2012-10-09 DIAGNOSIS — R0602 Shortness of breath: Secondary | ICD-10-CM

## 2012-10-09 DIAGNOSIS — E785 Hyperlipidemia, unspecified: Secondary | ICD-10-CM

## 2012-10-09 DIAGNOSIS — I509 Heart failure, unspecified: Secondary | ICD-10-CM

## 2012-10-09 LAB — BASIC METABOLIC PANEL
Calcium: 9.5 mg/dL (ref 8.4–10.5)
Creatinine, Ser: 1.7 mg/dL — ABNORMAL HIGH (ref 0.4–1.5)
GFR: 42.63 mL/min — ABNORMAL LOW (ref >60.00)
Glucose, Bld: 101 mg/dL — ABNORMAL HIGH (ref 70–99)
Sodium: 139 mEq/L (ref 135–145)

## 2012-10-09 NOTE — Progress Notes (Signed)
Jesus Sanders Date of Birth: 07/11/1936 Medical Record #[id number]  History of Present Illness: Jesus Sanders is seen back today for a followup visit.  He had a larger anterior apical MI with BMS to the LAD in September 2012. He was felt to have presented late. EF is 25%. He underwent placement of a prophylactic ICD in early March. He denies any chest pain. He still complains that he is short of breath all the time. He equates this with a lot of sinus congestion. He continues to smoke cigars. He is very sedentary. He only takes Lasix as needed and states that he takes it rarely. He does get periodic phlebotomies for polycythemia but states he is off schedule he needs to go back by today to the oncologist.  Current Outpatient Prescriptions on File Prior to Visit  Medication Sig Dispense Refill  . albuterol (PROVENTIL HFA;VENTOLIN HFA) 108 (90 BASE) MCG/ACT inhaler Inhale 2 puffs into the lungs every 4 (four) hours as needed for wheezing or shortness of breath.  1 Inhaler  4  . aspirin EC 81 MG tablet Take 81 mg by mouth daily.      Marland Kitchen atorvastatin (LIPITOR) 80 MG tablet TAKE 1 TABLET AT BEDTIME  30 tablet  6  . carvedilol (COREG) 25 MG tablet Take 1 tablet (25 mg total) by mouth 2 (two) times daily.  60 tablet  11  . Chlorpheniramine Maleate (ALLERGY RELIEF PO) Take 1 tablet by mouth daily.       . fluticasone (FLONASE) 50 MCG/ACT nasal spray Place 2 sprays into the nose daily. 2 sprays into each nostril daily.  16 g  2  . furosemide (LASIX) 20 MG tablet Take 1 tablet (20 mg total) by mouth daily. Use as needed for swelling or for weight gain of 2 to 3 pounds overnight.  30 tablet  11  . lisinopril (PRINIVIL,ZESTRIL) 20 MG tablet Take 20 mg by mouth 2 (two) times daily.      Marland Kitchen loratadine (CLARITIN) 10 MG tablet Take 10 mg by mouth daily.      . pantoprazole (PROTONIX) 40 MG tablet Take 40 mg by mouth daily.        Marland Kitchen spironolactone (ALDACTONE) 25 MG tablet Take 1 tablet (25 mg total) by mouth daily.  Take 1 tablet my mouth daily  30 tablet  5    No Known Allergies  Past Medical History  Diagnosis Date  . Polycythemia vera     [facility]  will restart his phlebotomies from about Mar 15 2011  . Stomach ulcer   . STEMI (ST elevation myocardial infarction) Sept 2012    Larger anterior apical MI with BMS to LAD with EF of 25 to 30%  . Tobacco abuse     cigars  . Duodenal perforation June 2012  . Peritonitis June 2012  . S/P cardiac cath Sept 2012    BMS to LAD  . Pneumonia April 2012  . LV dysfunction Sept 2012    EF is 25%  . Systolic CHF, chronic   . GERD (gastroesophageal reflux disease)   . Ischemic cardiomyopathy   . Hypertension   . History of DVT (deep vein thrombosis)   . Chronic bronchitis   . Acute stomach ulcer 07/07/2011  . Bruises easily 05/03/2011  . CAD (coronary artery disease) 08/02/2011    Anterior apical STEMI in September of 2012 treated with BMS to the LAD. EF is 25%.    Marland Kitchen  HTN (hypertension) 09/06/2011  . ICD-Medtronic 04/11/2012    Past Surgical History  Procedure Date  . Cholecystectomy 03/2011    Dr. Carolynne Edouard  . Colon surgery   . Cardiac catheterization Sept 2012    Normal left main, occluded LAD, 40% LCX and normal RCA. EF is 30%  . US echocardiography Sept 2012    EF 25 to 30% with akinesis of the mid to distal anterior and apical myocardium, trivial AI and no apical thrombus  . Cardiac defibrillator placement     single chamber  . Shoulder arthroscopy     left, rotatotor cuff tendinopathy    History  Smoking status  . Current Every Day Smoker  . Types: Cigars  . Last Attempt to Quit: 03/04/2011  Smokeless tobacco  . Not on file    Comment: Quit smoking in April 2012. About 60-pack-year history of smoking    History  Alcohol Use No    Comment: socially    Family History  Problem Relation Age of Onset  . Lung disease Father     Review of Systems: The review of systems  is positive for periodic phlebotomy for polycythemia.   All other systems were reviewed and are negative.  Physical Exam: BP 138/78  Pulse 66  Ht 6' (1.829 m)  Wt 193 lb 6.4 oz (87.726 kg)  BMI 26.23 kg/m2  SpO2 98% Patient is  pleasant and in no acute distress. Skin is warm and dry. Color is normal.  HEENT is unremarkable. Normocephalic/atraumatic. PERRL. Sclera are nonicteric. Neck is supple. No masses. No JVD. Lungs are coarse with some scattered wheezes. Cardiac exam shows a regular rate and rhythm. Abdomen is soft. Extremities are without edema. Gait and ROM are intact. No gross neurologic deficits noted.  LABORATORY DATA: ICD assessment on October 2 was normal.   Assessment / Plan: 1. Chronic systolic congestive heart failure. He has been taking Lasix on a when necessary basis only. He is already optimized on his carvedilol, ACE inhibitor, and Aldactone. We'll check a basic metabolic panel and BNP today.  2. Status post prophylactic ICD.  3. Hypertension, controlled.  4. Coronary disease status post anterior myocardial infarction in September of 2012. He had a bare-metal stent placed to the LAD but he was felt to presented late in his course. He has a result an ischemic cardiomyopathy. He had no other obstructive coronary disease.  5. Tobacco use. I have recommended complete smoking cessation.

## 2012-10-09 NOTE — Patient Instructions (Signed)
Continue your current medication  Stop smoking completely  We will call with the results of your lab work today.   I will see you in 6 months.

## 2012-10-10 ENCOUNTER — Other Ambulatory Visit: Payer: Self-pay | Admitting: Cardiovascular Disease

## 2012-10-12 ENCOUNTER — Observation Stay (HOSPITAL_COMMUNITY): Payer: Medicare Other

## 2012-10-12 ENCOUNTER — Observation Stay (HOSPITAL_COMMUNITY)
Admission: EM | Admit: 2012-10-12 | Discharge: 2012-10-13 | Disposition: A | Discharge status: Home / Self Care | Payer: Medicare Other | Source: Ambulatory Visit | Attending: Internal Medicine | Admitting: Internal Medicine

## 2012-10-12 ENCOUNTER — Encounter (HOSPITAL_COMMUNITY): Payer: Self-pay | Admitting: *Deleted

## 2012-10-12 ENCOUNTER — Emergency Department (HOSPITAL_COMMUNITY): Payer: Medicare Other

## 2012-10-12 DIAGNOSIS — I10 Essential (primary) hypertension: Secondary | ICD-10-CM | POA: Diagnosis present

## 2012-10-12 DIAGNOSIS — J449 Chronic obstructive pulmonary disease, unspecified: Secondary | ICD-10-CM

## 2012-10-12 DIAGNOSIS — N179 Acute kidney failure, unspecified: Secondary | ICD-10-CM | POA: Diagnosis present

## 2012-10-12 DIAGNOSIS — I2589 Other forms of chronic ischemic heart disease: Secondary | ICD-10-CM | POA: Insufficient documentation

## 2012-10-12 DIAGNOSIS — J9620 Acute and chronic respiratory failure, unspecified whether with hypoxia or hypercapnia: Principal | ICD-10-CM | POA: Diagnosis present

## 2012-10-12 DIAGNOSIS — I509 Heart failure, unspecified: Secondary | ICD-10-CM | POA: Insufficient documentation

## 2012-10-12 DIAGNOSIS — Z79899 Other long term (current) drug therapy: Secondary | ICD-10-CM | POA: Insufficient documentation

## 2012-10-12 DIAGNOSIS — Z7982 Long term (current) use of aspirin: Secondary | ICD-10-CM | POA: Insufficient documentation

## 2012-10-12 DIAGNOSIS — I252 Old myocardial infarction: Secondary | ICD-10-CM | POA: Insufficient documentation

## 2012-10-12 DIAGNOSIS — I2510 Atherosclerotic heart disease of native coronary artery without angina pectoris: Secondary | ICD-10-CM | POA: Diagnosis present

## 2012-10-12 DIAGNOSIS — Z7902 Long term (current) use of antithrombotics/antiplatelets: Secondary | ICD-10-CM | POA: Insufficient documentation

## 2012-10-12 DIAGNOSIS — D45 Polycythemia vera: Secondary | ICD-10-CM | POA: Diagnosis present

## 2012-10-12 DIAGNOSIS — Z86718 Personal history of other venous thrombosis and embolism: Secondary | ICD-10-CM | POA: Insufficient documentation

## 2012-10-12 DIAGNOSIS — I255 Ischemic cardiomyopathy: Secondary | ICD-10-CM

## 2012-10-12 DIAGNOSIS — F17200 Nicotine dependence, unspecified, uncomplicated: Secondary | ICD-10-CM | POA: Insufficient documentation

## 2012-10-12 DIAGNOSIS — Z9861 Coronary angioplasty status: Secondary | ICD-10-CM | POA: Insufficient documentation

## 2012-10-12 DIAGNOSIS — Z95810 Presence of automatic (implantable) cardiac defibrillator: Secondary | ICD-10-CM | POA: Insufficient documentation

## 2012-10-12 DIAGNOSIS — R0602 Shortness of breath: Secondary | ICD-10-CM | POA: Insufficient documentation

## 2012-10-12 DIAGNOSIS — E785 Hyperlipidemia, unspecified: Secondary | ICD-10-CM | POA: Diagnosis present

## 2012-10-12 DIAGNOSIS — K219 Gastro-esophageal reflux disease without esophagitis: Secondary | ICD-10-CM | POA: Diagnosis present

## 2012-10-12 DIAGNOSIS — I5022 Chronic systolic (congestive) heart failure: Secondary | ICD-10-CM

## 2012-10-12 HISTORY — DX: Acute myocardial infarction, unspecified: I21.9

## 2012-10-12 LAB — TROPONIN I: Troponin I: <0.3 ng/mL (ref <0.30)

## 2012-10-12 LAB — BASIC METABOLIC PANEL
BUN: 32 mg/dL — ABNORMAL HIGH (ref 6–23)
CO2: 26 mEq/L (ref 19–32)
Chloride: 103 mEq/L (ref 96–112)
GFR calc Af Amer: 44 mL/min — ABNORMAL LOW (ref >90)
Potassium: 5.3 mEq/L — ABNORMAL HIGH (ref 3.5–5.1)

## 2012-10-12 LAB — CBC WITH DIFFERENTIAL/PLATELET
Basophils Relative: 0 % (ref 0–1)
Lymphocytes Relative: 20 % (ref 12–46)
Lymphs Abs: 2.1 10*3/uL (ref 0.7–4.0)
MCH: 23.8 pg — ABNORMAL LOW (ref 26.0–34.0)
MCV: 76 fL — ABNORMAL LOW (ref 78.0–100.0)
Monocytes Relative: 8 % (ref 3–12)
Platelets: 144 10*3/uL — ABNORMAL LOW (ref 150–400)
RDW: 20.7 % — ABNORMAL HIGH (ref 11.5–15.5)

## 2012-10-12 LAB — URINALYSIS, ROUTINE W REFLEX MICROSCOPIC
Nitrite: NEGATIVE
Protein, ur: NEGATIVE mg/dL
Specific Gravity, Urine: >1.046 — ABNORMAL HIGH (ref 1.005–1.030)
Urobilinogen, UA: 0.2 mg/dL (ref 0.0–1.0)

## 2012-10-12 LAB — PROTIME-INR
INR: 0.95 (ref 0.00–1.49)
Prothrombin Time: 12.6 seconds (ref 11.6–15.2)

## 2012-10-12 LAB — APTT: aPTT: 28 seconds (ref 24–37)

## 2012-10-12 MED ORDER — ATORVASTATIN CALCIUM 80 MG PO TABS
80.0000 mg | ORAL_TABLET | Freq: Every day | ORAL | Status: DC
  Administered 2012-10-12: 80 mg via ORAL
  Filled 2012-10-12 (×2): qty 1

## 2012-10-12 MED ORDER — ALBUTEROL SULFATE (5 MG/ML) 0.5% IN NEBU
2.5000 mg | INHALATION_SOLUTION | Freq: Four times a day (QID) | RESPIRATORY_TRACT | Status: DC
  Administered 2012-10-12 – 2012-10-13 (×3): 2.5 mg via RESPIRATORY_TRACT
  Filled 2012-10-12 (×3): qty 0.5

## 2012-10-12 MED ORDER — ASPIRIN EC 81 MG PO TBEC
81.0000 mg | DELAYED_RELEASE_TABLET | Freq: Every day | ORAL | Status: DC
  Administered 2012-10-12 – 2012-10-13 (×2): 81 mg via ORAL
  Filled 2012-10-12 (×3): qty 1

## 2012-10-12 MED ORDER — METHYLPREDNISOLONE SODIUM SUCC 125 MG IJ SOLR
125.0000 mg | Freq: Once | INTRAMUSCULAR | Status: AC
  Administered 2012-10-12: 125 mg via INTRAVENOUS
  Filled 2012-10-12: qty 2

## 2012-10-12 MED ORDER — HEPARIN SODIUM (PORCINE) 5000 UNIT/ML IJ SOLN
5000.0000 [IU] | Freq: Three times a day (TID) | INTRAMUSCULAR | Status: DC
  Filled 2012-10-12 (×3): qty 1

## 2012-10-12 MED ORDER — ENOXAPARIN SODIUM 30 MG/0.3ML ~~LOC~~ SOLN
30.0000 mg | SUBCUTANEOUS | Status: DC
  Administered 2012-10-12: 30 mg via SUBCUTANEOUS
  Filled 2012-10-12 (×2): qty 0.3

## 2012-10-12 MED ORDER — PANTOPRAZOLE SODIUM 40 MG PO TBEC
40.0000 mg | DELAYED_RELEASE_TABLET | Freq: Every day | ORAL | Status: DC
  Administered 2012-10-12 – 2012-10-13 (×2): 40 mg via ORAL
  Filled 2012-10-12: qty 1

## 2012-10-12 MED ORDER — IPRATROPIUM BROMIDE 0.02 % IN SOLN
0.5000 mg | Freq: Four times a day (QID) | RESPIRATORY_TRACT | Status: DC
  Administered 2012-10-12 – 2012-10-13 (×3): 0.5 mg via RESPIRATORY_TRACT
  Filled 2012-10-12 (×4): qty 2.5

## 2012-10-12 MED ORDER — ALBUTEROL SULFATE (5 MG/ML) 0.5% IN NEBU
2.5000 mg | INHALATION_SOLUTION | RESPIRATORY_TRACT | Status: AC
  Administered 2012-10-12: 2.5 mg via RESPIRATORY_TRACT
  Filled 2012-10-12: qty 0.5

## 2012-10-12 MED ORDER — FUROSEMIDE 20 MG PO TABS
20.0000 mg | ORAL_TABLET | Freq: Every day | ORAL | Status: DC
  Administered 2012-10-12: 20 mg via ORAL
  Filled 2012-10-12 (×2): qty 1

## 2012-10-12 MED ORDER — ALBUTEROL SULFATE (5 MG/ML) 0.5% IN NEBU: 2.5000 mg | INHALATION_SOLUTION | RESPIRATORY_TRACT | Status: AC | PRN

## 2012-10-12 MED ORDER — IOHEXOL 350 MG/ML SOLN
80.0000 mL | Freq: Once | INTRAVENOUS | Status: AC | PRN
  Administered 2012-10-12: 80 mL via INTRAVENOUS

## 2012-10-12 MED ORDER — DOXYCYCLINE HYCLATE 100 MG PO TABS
100.0000 mg | ORAL_TABLET | Freq: Two times a day (BID) | ORAL | Status: DC
  Administered 2012-10-12 – 2012-10-13 (×2): 100 mg via ORAL
  Filled 2012-10-12 (×3): qty 1

## 2012-10-12 MED ORDER — CARVEDILOL 25 MG PO TABS
25.0000 mg | ORAL_TABLET | Freq: Two times a day (BID) | ORAL | Status: DC
  Administered 2012-10-12: 25 mg via ORAL
  Filled 2012-10-12 (×4): qty 1

## 2012-10-12 MED ORDER — CARVEDILOL 25 MG PO TABS
25.0000 mg | ORAL_TABLET | Freq: Two times a day (BID) | ORAL | Status: DC
  Filled 2012-10-12: qty 1

## 2012-10-12 NOTE — ED Notes (Addendum)
Per EMS pt from home with c/o severe shortness of breath starting around 0200. On EMS arrival, noted to have ST elevation. No chest pain, no nausea/vomiting. Hx of defibrillator and stent placement. CBG 115. IV L 20G hand and LAC 20G. Pt received nitro SL x 1. HR 70's. Pt became diaphoretic in route. Pt given aspirin in route.

## 2012-10-12 NOTE — Consult Note (Signed)
Patient ID: Jesus Sanders MRN: [id number], DOB/AGE: 1936/01/15   Admit date: 10/12/2012  Primary Physician: Willey Blade, MD Primary Cardiologist: P. Swaziland, MD  Pt. Profile:  76 y/o male with h/o CAD and ICM s/p single lead ICD earlier this year who presented to the ED this AM with worsening dyspnea.  Problem List  Past Medical History  Diagnosis Date  . Polycythemia vera     a. [facility]  will restart his phlebotomies from about Mar 15 2011  . Stomach ulcer   . Tobacco abuse     a. cigars  . Duodenal perforation June 2012  . Peritonitis June 2012  . Pneumonia April 2012  . Systolic CHF, chronic     a. 12/2011 Echo: EF 25-30%, mid-dist antsept/inf, apical AK, Gr 1 DD, Triv AI, Mild MR.  . Ischemic cardiomyopathy     a. 01/2012 S/P MDT Protecta single lead ICD, ser # [id number]  . Hypertension   . History of DVT (deep vein thrombosis)   . Chronic bronchitis   . CAD (coronary artery disease)     a. 07/2011 Anterior apical STEMI/Cath/PCI: LM nl, LAD 100p/m (2.5 x 23mm Mini-Vision BMS), LCX 40p, RCA dominant, nl, EF 30%.  Marland Kitchen HTN (hypertension)   . GERD (gastroesophageal reflux disease)     Past Surgical History  Procedure Date  . Cholecystectomy 03/2011    Dr. Carolynne Edouard  . Colon surgery   . Cardiac catheterization Sept 2012    Normal left main, occluded LAD, 40% LCX and normal RCA. EF is 30%  . US echocardiography Sept 2012    EF 25 to 30% with akinesis of the mid to distal anterior and apical myocardium, trivial AI and no apical thrombus  . Cardiac defibrillator placement     single chamber  . Shoulder arthroscopy     left, rotatotor cuff tendinopathy    Allergies  No Known Allergies  HPI  76 y/o male with the above problem list.  He has some degree of chronic dyspnea and over the past few months, this has worsened and become associated with a productive cough of thick, white, sputum.  He denies fevers or  chills.  He weighs himself daily and his weight has been very stable at roughly 190 lbs w/o clothes on.  He does experience orthopnea and often sleeps sitting up, though it has been this way for months.  He denies orthopnea, early satiety, lower extremity edema, presyncope or syncope.  He has had no chest pain.  He saw Dr. Swaziland in the office on 12/3 with similar complaints and lab work was sent off including a bmet and pBNP.  His pBNP was only 125.  K was elevated @ 5.6 and Creat was elevated @ 1.7.  He was contacted and advised to discontinue his spironolactone.  Unfortunately, his dyspnea has persisted and last night, he was having trouble sleeping due to dyspnea and orthopnea.  He called EMS and after an albuterol treatment, he felt better.  He stayed home and tried to get some rest but within a few hrs, his dyspnea worsened again.  He called his dtr around 8 AM, and EMS was again called.  This time, he was taken to the [facility] ED.  Here, he remains dyspneic at rest and has to sit up at the side of the bed.  His O2 saturation is nl on 3 lpm.    Home Medications  Prior to Admission  medications   Medication Sig Start Date End Date Taking? Authorizing Provider  albuterol (PROVENTIL HFA;VENTOLIN HFA) 108 (90 BASE) MCG/ACT inhaler Inhale 2 puffs into the lungs every 4 (four) hours as needed for wheezing or shortness of breath. 03/29/12 03/29/13 Yes Na Li, MD  aspirin EC 81 MG tablet Take 81 mg by mouth daily.   Yes Historical Provider, MD  atorvastatin (LIPITOR) 80 MG tablet TAKE 1 TABLET AT BEDTIME 10/10/12  Yes Tonny Bollman, MD  carvedilol (COREG) 25 MG tablet Take 1 tablet (25 mg total) by mouth 2 (two) times daily. 02/27/12 02/26/13 Yes Peter M Swaziland, MD  Chlorpheniramine Maleate (ALLERGY RELIEF PO) Take 1 tablet by mouth daily.    Yes Historical Provider, MD  fluticasone (FLONASE) 50 MCG/ACT nasal spray Place 2 sprays into the nose daily. 2 sprays into each nostril daily. 04/16/12 04/16/13 Yes Daryel Gerald, MD  furosemide (LASIX) 20 MG tablet Take 1 tablet (20 mg total) by mouth daily. Use as needed for swelling or for weight gain of 2 to 3 pounds overnight. 06/28/12  Yes Peter M Swaziland, MD  lisinopril (PRINIVIL,ZESTRIL) 20 MG tablet Take 20 mg by mouth 2 (two) times daily. 10/03/11  Yes Rosalio Macadamia, NP  loratadine (CLARITIN) 10 MG tablet Take 10 mg by mouth daily.   Yes Historical Provider, MD  pantoprazole (PROTONIX) 40 MG tablet Take 40 mg by mouth daily.     Yes Historical Provider, MD  spironolactone (ALDACTONE) 25 MG tablet Take 1 tablet (25 mg total) by mouth daily. Take 1 tablet my mouth daily 06/25/12  Yes Duke Salvia, MD   Family History  Family History  Problem Relation Age of Onset  . Lung disease Father    Social History  History   Social History  . Marital Status: Married    Spouse Name: N/A    Number of Children: N/A  . Years of Education: N/A   Occupational History  . Not on file.   Social History Main Topics  . Smoking status: Current Every Day Smoker -- 0.5 packs/day for 60 years    Types: Cigars    Last Attempt to Quit: 03/04/2011  . Smokeless tobacco: Not on file     Comment: Smoked .5-1ppd of cigarettes from age 15 to a few yrs ago and has been smoking 10 small cigars daily since then.  . Alcohol Use: No     Comment: socially  . Drug Use: No  . Sexually Active: No   Other Topics Concern  . Not on file   Social History Narrative   Lives with his granddaughter at home. Experiences dyspnea with minimal activity - sedentary.  Wife died about 4 years ago.   Retired IT sales professional.    Review of Systems General:  No chills, fever, night sweats or weight changes.  Cardiovascular:  +++ DOE.  Denies chest pain, edema, orthopnea, palpitations, paroxysmal nocturnal dyspnea. Dermatological: No rash, lesions/masses Respiratory: +++ productive cough, dyspnea Urologic: No hematuria, dysuria Abdominal:   No nausea, vomiting, diarrhea, bright red  blood per rectum, melena, or hematemesis Neurologic:  No visual changes, wkns, changes in mental status. All other systems reviewed and are otherwise negative except as noted above.  Physical Exam  Afebrile, 79, 24, 100/63, SpO2 97%, 3lpm.  General: Pleasant, NAD.  Breathing labored with speech. Psych: Normal affect. Neuro: Alert and oriented X 3. Moves all extremities spontaneously. HEENT: Normal  Neck: Supple without bruits or JVD. Lungs:  Resp regular with diminished breath  sounds bilaterally, L>R with exp wheezing throughout. Heart: RRR no s3, s4, or murmurs. Abdomen: Soft, non-tender, non-distended, BS + x 4.  Extremities: No clubbing, cyanosis.  Trace bilat LE edema. DP/PT/Radials 2+ and equal bilaterally.  Labs   Lab 10/09/12 0925  NA 139  K 5.6*  CL 104  CO2 28  BUN 31*  CREATININE 1.7*  CALCIUM 9.5  PROT --  BILITOT --  ALKPHOS --  ALT --  AST --  GLUCOSE 101*   pBNP 125 (10/09/2012)  Radiology/Studies  cxr pending  ECG  RSR, 81, poor R prog with deep lateral TWI - no acute st/t changes  ASSESSMENT AND PLAN  1.  Acute on chronic respiratory failure/COPD:  Pt presents with progressively worsening dyspnea culminating in severe dyspnea overnight and this AM.  Lab work and cxr are pending at this time however exam suggests AECOPD.  Pro-BNP 3 days ago was nl, in setting of similar Ss.  Rec IM evaluation as he will require aggressive mgmt of pulmonary dzs with inhalers and possibly steroids/abx.    2.  Chronic systolic CHF/ICM:  As above, suspect current Ss are pulmonary in nature.  He has mild LEE but otw appears euvolemic.  He reports steady weights at home and has not had to take prn lasix.  He is on coreg and lisinopril at home and spiro was recently held 2/2 acute renal failure and hyperkalemia.  BMET currently pending today.  If K/Creat remain elevated, will have to hold ACEI also.  Will also need to consider switching coreg to either toprol XL or bisoprolol  and ACEI to ARB given #1 with chronic cough.  3.  CAD:  No recent chest pain.  ECG is abnl but not acutely changed from prior ECG's.  Cont asa, statin, bb.  4.  Tobacco Abuse:  Cessation advised.  5. Polycythemia Vera:  Followed by Dr. Darnelle Catalan.  Previous phlebotomy goals were to phlebotomize once HCT hit 50+ with goal HCT of 40.  He has not been on Hydrea b/c he doesn't have the funds to afford it.  CBC pending.  H/H 13.1/41.5 07/19/2012.  Signed, Nicolasa Ducking, NP 10/12/2012, 10:20 AM The patient was seen in the emergency room.  I reviewed the history with the patient.  He has had a chronic cough but worsening recently.  He denies any chest pain.  I reviewed his chest x-ray which shows an air space density in the right lower lung field which is concerning for possible malignancy although pneumonia or atelectasis or pulmonary embolus are other considerations.  Physical examination is remarkable for lack of orthopnea and he has expiratory wheezes on examination.  Agree with assessment and plan as noted above.

## 2012-10-12 NOTE — ED Provider Notes (Signed)
History     CSN: [id number]  Arrival date & time 10/12/12  4098   First MD Initiated Contact with Patient 10/12/12 [phone]      Chief Complaint  Patient presents with  . Shortness of Breath    (Consider location/radiation/quality/duration/timing/severity/associated sxs/prior treatment) HPI Level 5 caveat due to urgent need for intervention Pt with history of STEMI and cardiomyopathy brought to the ED via EMS with increasing SOB during the night without CP, cough or fever. He had stent and AICD placed about a year ago. Given ASA and NTG by EMS without change. Initially activated Cath Lab for ST changes on EMS EKG.   Past Medical History  Diagnosis Date  . Polycythemia vera     [facility]  will restart his phlebotomies from about Mar 15 2011  . Stomach ulcer   . STEMI (ST elevation myocardial infarction) Sept 2012    Larger anterior apical MI with BMS to LAD with EF of 25 to 30%  . Tobacco abuse     cigars  . Duodenal perforation June 2012  . Peritonitis June 2012  . S/P cardiac cath Sept 2012    BMS to LAD  . Pneumonia April 2012  . LV dysfunction Sept 2012    EF is 25%  . Systolic CHF, chronic   . GERD (gastroesophageal reflux disease)   . Ischemic cardiomyopathy   . Hypertension   . History of DVT (deep vein thrombosis)   . Chronic bronchitis   . Acute stomach ulcer 07/07/2011  . Bruises easily 05/03/2011  . CAD (coronary artery disease) 08/02/2011    Anterior apical STEMI in September of 2012 treated with BMS to the LAD. EF is 25%.    Marland Kitchen HTN (hypertension) 09/06/2011  . ICD-Medtronic 04/11/2012    Past Surgical History  Procedure Date  . Cholecystectomy 03/2011    Dr. Carolynne Edouard  . Colon surgery   . Cardiac catheterization Sept 2012    Normal left main, occluded LAD, 40% LCX and normal RCA. EF is 30%  . US echocardiography Sept 2012    EF 25 to 30% with akinesis of the mid to distal anterior and apical  myocardium, trivial AI and no apical thrombus  . Cardiac defibrillator placement     single chamber  . Shoulder arthroscopy     left, rotatotor cuff tendinopathy    Family History  Problem Relation Age of Onset  . Lung disease Father     History  Substance Use Topics  . Smoking status: Current Every Day Smoker    Types: Cigars    Last Attempt to Quit: 03/04/2011  . Smokeless tobacco: Not on file     Comment: Quit smoking in April 2012. About 60-pack-year history of smoking  . Alcohol Use: No     Comment: socially      Review of Systems All other systems reviewed and are negative except as noted in HPI.   Allergies  Review of patient's allergies indicates no known allergies.  Home Medications   Current Outpatient Rx  Name  Route  Sig  Dispense  Refill  . ALBUTEROL SULFATE HFA 108 (90 BASE) MCG/ACT IN AERS   Inhalation   Inhale 2 puffs into the lungs every 4 (four) hours as needed for wheezing or shortness of breath.   1 Inhaler   4   . ASPIRIN EC 81 MG PO TBEC   Oral   Take 81 mg by mouth  daily.         . ATORVASTATIN CALCIUM 80 MG PO TABS      TAKE 1 TABLET AT BEDTIME   30 tablet   6   . CARVEDILOL 25 MG PO TABS   Oral   Take 1 tablet (25 mg total) by mouth 2 (two) times daily.   60 tablet   11   . ALLERGY RELIEF PO   Oral   Take 1 tablet by mouth daily.          Marland Kitchen FLUTICASONE PROPIONATE 50 MCG/ACT NA SUSP   Nasal   Place 2 sprays into the nose daily. 2 sprays into each nostril daily.   16 g   2   . FUROSEMIDE 20 MG PO TABS   Oral   Take 1 tablet (20 mg total) by mouth daily. Use as needed for swelling or for weight gain of 2 to 3 pounds overnight.   30 tablet   11   . LISINOPRIL 20 MG PO TABS   Oral   Take 20 mg by mouth 2 (two) times daily.         Marland Kitchen LORATADINE 10 MG PO TABS   Oral   Take 10 mg by mouth daily.         Marland Kitchen PANTOPRAZOLE SODIUM 40 MG PO TBEC   Oral   Take 40 mg by mouth daily.           Marland Kitchen SPIRONOLACTONE 25 MG  PO TABS   Oral   Take 1 tablet (25 mg total) by mouth daily. Take 1 tablet my mouth daily   30 tablet   5     There were no vitals taken for this visit.  Physical Exam  Nursing note and vitals reviewed. Constitutional: He is oriented to person, place, and time. He appears well-developed and well-nourished.  HENT:  Head: Normocephalic and atraumatic.  Eyes: EOM are normal. Pupils are equal, round, and reactive to light.  Neck: Normal range of motion. Neck supple.  Cardiovascular: Normal rate, normal heart sounds and intact distal pulses.   Pulmonary/Chest: Effort normal. He has wheezes.  Abdominal: Bowel sounds are normal. He exhibits no distension. There is no tenderness.  Musculoskeletal: Normal range of motion. He exhibits no edema and no tenderness.  Neurological: He is alert and oriented to person, place, and time. He has normal strength. No cranial nerve deficit or sensory deficit.  Skin: Skin is warm and dry. No rash noted.  Psychiatric: He has a normal mood and affect.    ED Course  Procedures (including critical care time)  Labs Reviewed  CBC WITH DIFFERENTIAL - Abnormal; Notable for the following:    MCV 76.0 (*)     MCH 23.8 (*)     RDW 20.7 (*)     Platelets 144 (*)     All other components within normal limits  BASIC METABOLIC PANEL - Abnormal; Notable for the following:    Potassium 5.3 (*)     Glucose, Bld 106 (*)     BUN 32 (*)     Creatinine, Ser 1.71 (*)     GFR calc non Af Amer 38 (*)     GFR calc Af Amer 44 (*)     All other components within normal limits  PRO B NATRIURETIC PEPTIDE - Abnormal; Notable for the following:    Pro B Natriuretic peptide (BNP) 939.9 (*)     All other components within normal limits  BASIC METABOLIC  PANEL - Abnormal; Notable for the following:    Potassium 5.4 (*)     Glucose, Bld 124 (*)     BUN 39 (*)     Creatinine, Ser 1.64 (*)     GFR calc non Af Amer 40 (*)     GFR calc Af Amer 46 (*)     All other components  within normal limits  CBC - Abnormal; Notable for the following:    WBC 16.0 (*)     Hemoglobin 12.6 (*)     MCV 75.2 (*)     MCH 23.5 (*)     RDW 20.4 (*)     Platelets 134 (*)     All other components within normal limits  URINALYSIS, ROUTINE W REFLEX MICROSCOPIC - Abnormal; Notable for the following:    Specific Gravity, Urine >1.046 (*)     Leukocytes, UA TRACE (*)     All other components within normal limits  TROPONIN I  PROTIME-INR  APTT  TROPONIN I  TROPONIN I  URINE MICROSCOPIC-ADD ON  MICROALBUMIN / CREATININE URINE RATIO  UREA NITROGEN, URINE   Dg Chest 2 View  10/12/2012  *RADIOLOGY REPORT*  Clinical Data: Pt c/o ongoing sob that has worsened lately. Productive Cough. Smoker 1/2 pack cigars/day, hx defibrillator and stent. No other chest complaints.  CHEST - 2 VIEW  Comparison: 03/26/2012  Findings: AICD remains in place.  Right pleural effusion with blunting of the right costophrenic angle.  Trace left pleural effusion.  Bilateral interstitial opacity noted. Indistinct airspace opacity at the right lung base.  No cardiomegaly.  Aortic atherosclerosis noted.  Thoracic spondylosis is present.  IMPRESSION:  1.  Small right and trace left pleural effusions. 2.  Abnormal interstitial accentuation noted bilaterally.  No cardiomegaly is observed. 3.  Indistinct airspace opacity, right lung base, potentially atelectasis, pneumonia, or malignancy.  CT chest with contrast could be utilized for further characterization.  If there is concern for pulmonary embolus, CTA pulmonary embolus protocol might be useful.   Original Report Authenticated By: Gaylyn Rong, M.D.    Ct Angio Chest Pe W/cm &/or Wo Cm  10/12/2012  *RADIOLOGY REPORT*  Clinical Data: Worsening shortness of breath.  CT ANGIOGRAPHY CHEST  Technique:  Multidetector CT imaging of the chest using the standard protocol during bolus administration of intravenous contrast. Multiplanar reconstructed images including MIPs were  obtained and reviewed to evaluate the vascular anatomy.  Contrast: 80mL OMNIPAQUE IOHEXOL 350 MG/ML SOLN  Comparison: Chest CT 04/03/2012.  Findings: The chest wall is stable.  Chest wall collaterals are noted on the right side.  This may be due to the subclavian vein stenosis.  No chest wall mass lesions, supraclavicular or axillary adenopathy.  The bony thorax is intact.  No destructive bone lesions or spinal canal compromise.  The heart is normal in size.  No pericardial effusion.  No mediastinal or hilar lymphadenopathy.  The esophagus is grossly normal.  The aorta demonstrates moderate atherosclerotic calcifications.  Mild tortuosity and ectasia but no focal aneurysm or dissection.  Dense coronary artery calcifications are noted.  The pulmonary arterial tree is well opacified.  No filling defects to suggest pulmonary emboli.  Examination of the lung parenchyma demonstrates advanced emphysematous changes.  There is dense pleural calcification on the right side likely due to remote trauma or infection.  The right lower lobe opacity demonstrated on the prior CT scan is much improved.  It had the appearance of rounded atelectasis.  No worrisome mass lesion.  Residual scarring changes.  The left lung is grossly clear.  The upper abdomen is unremarkable.  Stable left adrenal gland mass.  IMPRESSION:  1.  No CT findings for pulmonary embolism. 2.  Stable tortuosity, ectasia and calcification of the thoracic aorta. 3.  Much improved right lower lobe lung process which was likely rounded atelectasis.  Some residual scarring and atelectasis. 4.  Stable dense pleural calcifications in the right hemithorax likely due to previous trauma or infection. 5.  Advanced emphysematous changes.   Original Report Authenticated By: Rudie Meyer, M.D.      No diagnosis found.    MDM   Date: 10/12/2012  Rate: 79  Rhythm: normal sinus rhythm  QRS Axis: left  Intervals: normal  ST/T Wave abnormalities: nonspecific ST/T  changes  Conduction Disutrbances:left anterior fascicular block  Narrative Interpretation:   Old EKG Reviewed: unchanged, ST/T changes anterio-laterally are unchanged from 03/2012  [facility] at bedside on arrival due to Cath Lab activation but given no acute EKG changes and lack of CP, Code STEMI was cancelled. Will begin workup, but [facility] to evaluate as well.   Pt with neg cardiac markers and low concern for CHF exacerbation, appears to COPD as the source of his symptoms. Admit to [facility] for further eval.         Leonette Most B. Bernette Mayers, MD 10/13/12 [phone]

## 2012-10-12 NOTE — ED Notes (Signed)
Pt denies chest pain; pt denies dizziness and lightheadedness; pt mentating appropriately.

## 2012-10-12 NOTE — ED Notes (Signed)
Patient transported to X-ray 

## 2012-10-12 NOTE — ED Notes (Signed)
Pt denies chest pain; pt denies shortness of breath currently. Pt mentating appropriately.

## 2012-10-12 NOTE — H&P (Signed)
Hospital Admission Note Date: 10/12/2012  Patient name: Jesus Sanders Medical record number: [id number] Date of birth: 09-20-1936 Age: 76 y.o. Gender: male PCP: Kathreen Cosier, MD  Medical Service: Internal Medicine Teaching Service--[facility]  Attending physician:  Dr. Eben Burow    1st Contact: Dr. Cicero Duck 2nd Contact: Dr. Everardo Beals    [id number] After 5 pm or weekends: 1st Contact:      Pager: [phone] 2nd Contact:      Pager: [phone]  Chief Complaint: Shortness of breath  History of Present Illness:Jesus Sanders is a 76 year old white male with PMH of Polycythemia vera, CHF (systolic 35-30%), Ischemic cardiomyopathy, history of DVTs, CAD--defibrillator 01/11/12, HTN, and GERD presenting to the ED with complaints of worsening shortness of breath this morning.  He claims starting from midnight he was having difficulty breathing but was able to fall asleep, however, when he woke up this morning, he could not catch his breath and called EMS.  He apparently called EMS the night before and improved after receiving a breathing treatment, however, his symptoms returned again this morning.  [name] also endorses chronic productive cough with white sputum and smokes approximately 6 small cigars a day. Upon arrival to ED, he was noted to have ST elevation on EKG.  He was evaluated by cardiology and thought more likely to have acute on chronic respiratory failure.  He denies any fever, chills, N/V/D, headaches, chest pain, abdominal pain, or any urinary complaints at this time.    Of note, Jesus Sanders sleeps with one pillow at night however has to sleep with head of bed elevated to breath better.  He saw Dr. Swaziland in the office on 12/3 with similar complaints and pBNP was only 125.  His potassium was noted to be elevated to 5.6 at that time with Cr 1.7 and his spironolactone was subsequently held.   He was also supposed to establish primary care with [facility]--Dr. Felicity Coyer, however that was  discontinued.  In the ED today, he received a breathing treatment and IV solumedrol 125mg  x1 and reports significant improvement in breathing.  Oxygen saturation upon ambulation ranged from 89-86% with pulse ~86bpm.  At rest on room air he was saturating 93-95%.    Meds: Medications Prior to Admission  Medication Sig Dispense Refill  . albuterol (PROVENTIL HFA;VENTOLIN HFA) 108 (90 BASE) MCG/ACT inhaler Inhale 2 puffs into the lungs every 4 (four) hours as needed for wheezing or shortness of breath.  1 Inhaler  4  . aspirin EC 81 MG tablet Take 81 mg by mouth daily.      Marland Kitchen atorvastatin (LIPITOR) 80 MG tablet TAKE 1 TABLET AT BEDTIME  30 tablet  6  . carvedilol (COREG) 25 MG tablet Take 1 tablet (25 mg total) by mouth 2 (two) times daily.  60 tablet  11  . Chlorpheniramine Maleate (ALLERGY RELIEF PO) Take 1 tablet by mouth daily.       . fluticasone (FLONASE) 50 MCG/ACT nasal spray Place 2 sprays into the nose daily. 2 sprays into each nostril daily.  16 g  2  . furosemide (LASIX) 20 MG tablet Take 1 tablet (20 mg total) by mouth daily. Use as needed for swelling or for weight gain of 2 to 3 pounds overnight.  30 tablet  11  . lisinopril (PRINIVIL,ZESTRIL) 20 MG tablet Take 20 mg by mouth 2 (two) times daily.      Marland Kitchen loratadine (CLARITIN) 10 MG tablet Take 10 mg by mouth daily.      Marland Kitchen  pantoprazole (PROTONIX) 40 MG tablet Take 40 mg by mouth daily.        Marland Kitchen spironolactone (ALDACTONE) 25 MG tablet Take 1 tablet (25 mg total) by mouth daily. Take 1 tablet my mouth daily  30 tablet  5   Allergies: Allergies as of 10/12/2012  . (No Known Allergies)   Past Medical History  Diagnosis Date  . Polycythemia vera     a. [facility]  will restart his phlebotomies from about Mar 15 2011  . Stomach ulcer   . Tobacco abuse     a. cigars  . Duodenal perforation June 2012  . Peritonitis June 2012  . Pneumonia April 2012  . Systolic CHF,  chronic     a. 11/6107 Echo: EF 25-30%, mid-dist antsept/inf, apical AK, Gr 1 DD, Triv AI, Mild MR.  . Ischemic cardiomyopathy     a. 01/2012 S/P MDT Protecta single lead ICD, ser # [id number] H  . Hypertension   . History of DVT (deep vein thrombosis)   . Chronic bronchitis   . CAD (coronary artery disease)     a. 07/2011 Anterior apical STEMI/Cath/PCI: LM nl, LAD 100p/m (2.5 x 23mm Mini-Vision BMS), LCX 40p, RCA dominant, nl, EF 30%.  Marland Kitchen HTN (hypertension)   . GERD (gastroesophageal reflux disease)    Past Surgical History  Procedure Date  . Cholecystectomy 03/2011    Dr. Carolynne Edouard  . Colon surgery   . Cardiac catheterization Sept 2012    Normal left main, occluded LAD, 40% LCX and normal RCA. EF is 30%  . US echocardiography Sept 2012    EF 25 to 30% with akinesis of the mid to distal anterior and apical myocardium, trivial AI and no apical thrombus  . Cardiac defibrillator placement     single chamber  . Shoulder arthroscopy     left, rotatotor cuff tendinopathy   Family History  Problem Relation Age of Onset  . Lung disease Father    History   Social History  . Marital Status: Married    Spouse Name: N/A    Number of Children: N/A  . Years of Education: N/A   Occupational History  . Not on file.   Social History Main Topics  . Smoking status: Current Every Day Smoker -- 0.5 packs/day for 60 years    Types: Cigars    Last Attempt to Quit: 03/04/2011  . Smokeless tobacco: Not on file     Comment: Smoked .5-1ppd of cigarettes from age 46 to a few yrs ago and has been smoking 10 small cigars daily since then.  . Alcohol Use: No     Comment: socially  . Drug Use: No  . Sexually Active: No   Other Topics Concern  . Not on file   Social History Narrative   Lives with his granddaughter at home. Experiences dyspnea with minimal activity - sedentary.  Wife died about 4 years ago.   Retired IT sales professional.   Review of Systems: Pertinent items are noted in HPI.  Physical  Exam: Blood pressure 118/58, pulse 71, temperature 97.6 F (36.4 C), temperature source Oral, resp. rate 20, height 6' (1.829 m), weight 185 lb 6.5 oz (84.1 kg), SpO2 98.00%. Vitals reviewed. General: resting in bed, NAD, on [place] 2L O2 HEENT: PERRLA, EOMI, no scleral icterus Cardiac: RRR, no rubs, murmurs or gallops Pulm: clear to auscultation bilaterally, no wheezes, rales, or rhonchi Abd: soft, nontender, nondistended, BS present Ext: warm and  well perfused, no pedal edema, +2dp b/l Neuro: alert and oriented X3, cranial nerves II-XII grossly intact, strength and sensation to light touch equal in bilateral upper and lower extremities  Lab results: Basic Metabolic Panel:  Basename 10/12/12 0928  NA 139  K 5.3*  CL 103  CO2 26  GLUCOSE 106*  BUN 32*  CREATININE 1.71*  CALCIUM 9.9  MG --  PHOS --   CBC:  Basename 10/12/12 0928  WBC 10.1  NEUTROABS 7.0  HGB 13.8  HCT 44.0  MCV 76.0*  PLT 144*   Cardiac Enzymes:  Basename 10/12/12 0932  CKTOTAL --  CKMB --  CKMBINDEX --  TROPONINI <0.30   BNP:  Basename 10/12/12 0932  PROBNP 939.9*   Coagulation:  Basename 10/12/12 0928  LABPROT 12.6  INR 0.95   Imaging results:  Dg Chest 2 View  10/12/2012  *RADIOLOGY REPORT*  Clinical Data: Pt c/o ongoing sob that has worsened lately. Productive Cough. Smoker 1/2 pack cigars/day, hx defibrillator and stent. No other chest complaints.  CHEST - 2 VIEW  Comparison: 03/26/2012  Findings: AICD remains in place.  Right pleural effusion with blunting of the right costophrenic angle.  Trace left pleural effusion.  Bilateral interstitial opacity noted. Indistinct airspace opacity at the right lung base.  No cardiomegaly.  Aortic atherosclerosis noted.  Thoracic spondylosis is present.  IMPRESSION:  1.  Small right and trace left pleural effusions. 2.  Abnormal interstitial accentuation noted bilaterally.  No cardiomegaly is observed. 3.  Indistinct airspace opacity, right lung base,  potentially atelectasis, pneumonia, or malignancy.  CT chest with contrast could be utilized for further characterization.  If there is concern for pulmonary embolus, CTA pulmonary embolus protocol might be useful.   Original Report Authenticated By: Gaylyn Rong, M.D.    Other results: EKG: 79bpm, left axis deviation, t wave inversions anterior lateral leads--seen in prior ekg as well  Assessment & Plan by Problem: Mr. Lierman is a 76 year old white male with PMH of Polycythemia vera, extensive cardiac history, and COPD admitted for acute respiratory failure.    Acute-on-chronic respiratory failure--presented with complaints of dyspnea at rest.  Has hx of severe COPD with FEV1/FVC 0.40 and CHF with estimated EF 25-30%.  CXR on admission: small right and trace left pleural effusions, abnormal interstitial accentuation noted b/l, indistinct airspace opacity right lung base.  Symptoms could possibly be secondary to COPD exacerbation given worsening cough and shortness of breath, however, no subjective change in sputum quantity or quality.  Other differentials include possible RLL pneumonia as demonstrated by CXR, however, afebrile, no leukocytosis, prior RLL PNA in 2012 with similar changes seen on follow up imaging.  Consideration should also be given to malignancy due to significant ongoing smoking history and possible mass on imaging.  PE as a cause of worsening dyspnea is certainly possible especially with hx of DVTs and modified geneva score of 10--intermediate probability.  CHF exacerbation is also possible given symptoms of orthopnea, dyspnea on exertion, significant cardiac history, elevated proBNP 939.9 (increased from 125 on 10/09/12).  ACS workup underway given significant hx of CAD--07/2011: anterior apical MI s/p BMS to LAD.  Cath 07/15/2011: occluded LAD, 40% LCX, normal RCA, EF 30%.   Echo 12/2011: 25-30%, mildly dilated LV with concentric hypertrophy, severely reduced systolic function, akinesis  of myocardium.  Troponin x1 negative and evaluated by cardiology in ED (Dr. Patty Sermons) who suspect more pulmonary nature of current complaint.    -admit to tele -cycle CE -CT chest -  duonebs -doxycycline -prednisone taper  -ASA -Statin -BB -ACE I held in setting of AKI -Lasix 20mg  QD -Protonix -continuous pulse oximetry -oxygen PRN  2L, keep o2 sat >92%   Acute renal failure--Cr on admission: 1.71, GFR: 38.  Possibly secondary to hypovolemia/pre-renal vs. Renal causes infectious? vs. Medication induced?  Obstruction seems unlikely given lack of supporting symptoms and producing sufficient urine.     - u/a - microalbumin/cr ratio - Fractional excretion of urea - continue to monitor   Polycythemia vera--Followed by Dr. Darnelle Catalan at [facility]. Undergoing phlebotomy--last session 07/19/2012. Hb 13.8 on admission with platelets 144 on admission. -contact Dr. Darnelle Catalan for schedule of possible next session -continue to monitor   CAD (coronary artery disease)--s/p BMS to LAD and defibrillator in place.  07/2011: anterior apical MI s/p BMS to LAD.  Cath 07/15/2011: occluded LAD, 40% LCX, normal RCA, EF 30%.   Echo 12/2011: 25-30%, mildly dilated LV with concentric hypertrophy, severely reduced systolic function, akinesis of myocardium.  Follows with Dr. Swaziland at [place].  Recently seen on 12/3 for complaints of dyspnea.  On home regimen of:   ASA, Lipitor 80mg , coreg 25mg  BID, lasix 20mg  qd, lisinopril 20mg  BID, and aldactone 25mg  QD.  Spironolactone was held on 10/09/12 given hyperkalemia and AKI.  ProBNP: 939, increased from 10/09/12: 125 -continue asa, bb, lasix, and lipitor -hold ACEI in setting of AKI and spironolactone in setting of hyperkalemia   HTN (hypertension)--BP on admission 118/58.  On home regimen, coreg, lasix, lisinopril, and aldactone. -continue coreg and lasix; consider switching coreg to either toprol XL or bisoprolol and ACEI to ARB given chronic cough as per  cardiology. -hold ACEI in setting of AKI and spironolactone in setting of hyperkalemia -continue to monitor -orthostatic vital signs   GERD (gastroesophageal reflux disease)--on protonix at home 40mg  -continue protonix   Hyperlipidemia--lipid panel 09811: LDL 54, HDL:35.30, CHOL:106, TG: 85.  On lipitor 80mg  QD -continue lipitor  Diet: Heart healthy DVT Ppx: heparin  Dispo: Disposition is deferred at this time, awaiting improvement of current medical problems. Anticipated discharge in approximately 1-2 day(s).   The patient does not have a current PCP , therefore will be requiring OPC follow-up after discharge.   The patient does not have transportation limitations that hinder transportation to clinic appointments.  SignedDarden Palmer 10/12/2012, 4:37 PM

## 2012-10-12 NOTE — ED Notes (Signed)
Pt denies chest pain; pt states shortness of breath esp. When lays down. Pt mentating appropriately.

## 2012-10-12 NOTE — ED Notes (Signed)
Lab came to redraw blood. Dr. Bernette Mayers notified.

## 2012-10-12 NOTE — Progress Notes (Signed)
Patient is currently being transported to radiology. D. Thadeus Gandolfi RN 

## 2012-10-12 NOTE — ED Notes (Signed)
Internal medicine at bedside. Pt denies chest pain.

## 2012-10-12 NOTE — Progress Notes (Signed)
Patient has arrived to unit, report received from nurse Lauren in the ED; will continue to monitor patient.  Lorretta Harp RN

## 2012-10-12 NOTE — ED Notes (Signed)
Pt given cup of coffee.

## 2012-10-12 NOTE — ED Notes (Signed)
RT at bedside.

## 2012-10-12 NOTE — ED Notes (Signed)
Bjorn Loser- [phone] (daughter)- please call with any concerns.

## 2012-10-12 NOTE — Progress Notes (Signed)
Patient has returned to unit from radiology; will continue to monitor patient.  Lorretta Harp RN

## 2012-10-12 NOTE — ED Notes (Signed)
Pt ambulated around Pod A. Pts oxygen saturation ranged from 89-86 but averaged about 88 throughout. Pts pulse maintained at 86. Dr. Bernette Mayers and Internal medicine doctors notified.

## 2012-10-12 NOTE — ED Notes (Signed)
Report given to [facility], Charity fundraiser. Nurse had no further questions upon report. Pt being prepared for transfer to floor.

## 2012-10-12 NOTE — ED Notes (Signed)
Pt denies chest pain. Pt sitting on end of bed eating meal.

## 2012-10-13 DIAGNOSIS — I2510 Atherosclerotic heart disease of native coronary artery without angina pectoris: Secondary | ICD-10-CM

## 2012-10-13 DIAGNOSIS — I2589 Other forms of chronic ischemic heart disease: Secondary | ICD-10-CM

## 2012-10-13 LAB — MICROALBUMIN / CREATININE URINE RATIO
Creatinine, Urine: 103.5 mg/dL
Microalb, Ur: 2.61 mg/dL — ABNORMAL HIGH (ref 0.00–1.89)

## 2012-10-13 LAB — CBC
Hemoglobin: 12.6 g/dL — ABNORMAL LOW (ref 13.0–17.0)
MCH: 23.5 pg — ABNORMAL LOW (ref 26.0–34.0)
Platelets: 134 10*3/uL — ABNORMAL LOW (ref 150–400)
RBC: 5.36 MIL/uL (ref 4.22–5.81)
WBC: 16 10*3/uL — ABNORMAL HIGH (ref 4.0–10.5)

## 2012-10-13 LAB — BASIC METABOLIC PANEL
CO2: 24 mEq/L (ref 19–32)
Calcium: 9.7 mg/dL (ref 8.4–10.5)
GFR calc non Af Amer: 40 mL/min — ABNORMAL LOW (ref >90)
Glucose, Bld: 124 mg/dL — ABNORMAL HIGH (ref 70–99)
Potassium: 5.4 mEq/L — ABNORMAL HIGH (ref 3.5–5.1)
Sodium: 137 mEq/L (ref 135–145)

## 2012-10-13 MED ORDER — AZITHROMYCIN 250 MG PO TABS: 250.0000 mg | ORAL_TABLET | Freq: Every day | ORAL | Status: DC

## 2012-10-13 MED ORDER — AZITHROMYCIN 500 MG PO TABS
500.0000 mg | ORAL_TABLET | Freq: Every day | ORAL | Status: DC
  Filled 2012-10-13: qty 1

## 2012-10-13 MED ORDER — AZITHROMYCIN 250 MG PO TABS: ORAL_TABLET | ORAL | Status: DC

## 2012-10-13 MED ORDER — PREDNISONE 20 MG PO TABS: ORAL_TABLET | ORAL | Status: DC

## 2012-10-13 MED ORDER — PREDNISONE 20 MG PO TABS
40.0000 mg | ORAL_TABLET | Freq: Every day | ORAL | Status: DC
  Administered 2012-10-13: 40 mg via ORAL
  Filled 2012-10-13 (×2): qty 2

## 2012-10-13 NOTE — Discharge Summary (Signed)
Internal Medicine [facility] Discharge Note  Name: Jesus Sanders MRN: [id number] DOB: 22-Apr-1936 76 y.o.  Date of Admission: 10/12/2012  9:23 AM Date of Discharge: 10/13/2012 Attending Physician: Lars Mage, MD  Discharge Diagnosis: Principal Problem:  *Acute-on-chronic respiratory failure Active Problems:  Polycythemia vera  CAD (coronary artery disease)  HTN (hypertension)  GERD (gastroesophageal reflux disease)  Hyperlipidemia  Acute renal failure  Discharge Medications:   Medication List     As of 10/14/2012  9:56 AM    STOP taking these medications         carvedilol 25 MG tablet   Commonly known as: COREG      lisinopril 20 MG tablet   Commonly known as: PRINIVIL,ZESTRIL      spironolactone 25 MG tablet   Commonly known as: ALDACTONE      TAKE these medications         albuterol 108 (90 BASE) MCG/ACT inhaler   Commonly known as: PROVENTIL HFA;VENTOLIN HFA   Inhale 2 puffs into the lungs every 4 (four) hours as needed for wheezing or shortness of breath.      ALLERGY RELIEF PO   Take 1 tablet by mouth daily.      aspirin EC 81 MG tablet   Take 81 mg by mouth daily.      atorvastatin 80 MG tablet   Commonly known as: LIPITOR   TAKE 1 TABLET AT BEDTIME      azithromycin 250 MG tablet   Commonly known as: ZITHROMAX   Please take 500mg  for the first day and then 250mg  for the remainder 4 days.      fluticasone 50 MCG/ACT nasal spray   Commonly known as: FLONASE   Place 2 sprays into the nose daily. 2 sprays into each nostril daily.      furosemide 20 MG tablet   Commonly known as: LASIX   Take 1 tablet (20 mg total) by mouth daily. Use as needed for swelling or for weight gain of 2 to 3 pounds overnight.      loratadine 10 MG tablet   Commonly known as: CLARITIN   Take 10 mg by mouth daily.      pantoprazole 40 MG tablet   Commonly known as: PROTONIX   Take 40 mg by mouth daily.      predniSONE 20 MG tablet   Commonly known as:  DELTASONE   Please take 40mg  (2 tablets) daily 10/14/12 and 10/15/12 and then take 20mg  (1 tablet) daily for the next three days from 10/16/12 to 10/18/12.         Disposition and follow-up:   Mr.Jesus Sanders was discharged from [facility] in stable condition.  At the hospital follow up visit please address: -shortness of breath--discharged on zpack and prednisone taper--likely COPD exacerbation -HTN: was hypotensive during hospital admission, did not restart his HTN meds on discharge but will likely need to be restarted on re-evaluation.  Does not have a pcp at this time, but needs to establish one.  Spironolactone was discontinued by his cardiologist Dr. Swaziland.   -CAD: follow up with Dr. Swaziland cardiology. HTN meds currently on hold secondary to hypotension and bradycardia during hospital admission.  On lasix, lipitor, and asa continued on discharge.  -PCV: follows at cancer center with Dr. Jaynie Bream, undergoing phlebotomy sessions  -Acute renal failure--discharged with Cr 1.64.  Please get follow up BMET.    Follow-up Appointments:     Follow-up Information  Schedule an appointment as soon as possible for a visit with [facility]. (will schedule hospital follow up appointment in 1-2 weeks)    Contact information:   [address] [id number] mc [place] [address] [phone]      Follow up with Peter Swaziland, MD. (As needed)    Contact information:   [address] [place] [address] [phone]         Discharge Orders    Future Appointments: Provider: Department: Dept Phone: Center:   11/09/2012 8:10 AM [facility] [employer] [employer] Main Office [place]) [phone] [facility]   11/19/2012 8:10 AM [facility] Main Office [place]) [phone] [facility]     Future Orders Please Complete By Expires   Diet - low sodium heart healthy      Increase  activity slowly      Call MD for:  temperature >100.4      Call MD for:  difficulty breathing, headache or visual disturbances        Consultations: Treatment Team:  Rounding [facility], MD  Procedures Performed:  Dg Chest 2 View  10/12/2012  *RADIOLOGY REPORT*  Clinical Data: Pt c/o ongoing sob that has worsened lately. Productive Cough. Smoker 1/2 pack cigars/day, hx defibrillator and stent. No other chest complaints.  CHEST - 2 VIEW  Comparison: 03/26/2012  Findings: AICD remains in place.  Right pleural effusion with blunting of the right costophrenic angle.  Trace left pleural effusion.  Bilateral interstitial opacity noted. Indistinct airspace opacity at the right lung base.  No cardiomegaly.  Aortic atherosclerosis noted.  Thoracic spondylosis is present.  IMPRESSION:  1.  Small right and trace left pleural effusions. 2.  Abnormal interstitial accentuation noted bilaterally.  No cardiomegaly is observed. 3.  Indistinct airspace opacity, right lung base, potentially atelectasis, pneumonia, or malignancy.  CT chest with contrast could be utilized for further characterization.  If there is concern for pulmonary embolus, CTA pulmonary embolus protocol might be useful.   Original Report Authenticated By: Gaylyn Rong, M.D.    Ct Angio Chest Pe W/cm &/or Wo Cm  10/12/2012  *RADIOLOGY REPORT*  Clinical Data: Worsening shortness of breath.  CT ANGIOGRAPHY CHEST  Technique:  Multidetector CT imaging of the chest using the standard protocol during bolus administration of intravenous contrast. Multiplanar reconstructed images including MIPs were obtained and reviewed to evaluate the vascular anatomy.  Contrast: 80mL OMNIPAQUE IOHEXOL 350 MG/ML SOLN  Comparison: Chest CT 04/03/2012.  Findings: The chest wall is stable.  Chest wall collaterals are noted on the right side.  This may be due to the subclavian vein stenosis.  No chest wall mass lesions, supraclavicular or axillary adenopathy.  The bony  thorax is intact.  No destructive bone lesions or spinal canal compromise.  The heart is normal in size.  No pericardial effusion.  No mediastinal or hilar lymphadenopathy.  The esophagus is grossly normal.  The aorta demonstrates moderate atherosclerotic calcifications.  Mild tortuosity and ectasia but no focal aneurysm or dissection.  Dense coronary artery calcifications are noted.  The pulmonary arterial tree is well opacified.  No filling defects to suggest pulmonary emboli.  Examination of the lung parenchyma demonstrates advanced emphysematous changes.  There is dense pleural calcification on the right side likely due to remote trauma or infection.  The right lower lobe opacity demonstrated on the prior CT scan is much improved.  It had the appearance of rounded atelectasis.  No worrisome mass lesion.  Residual  scarring changes.  The left lung is grossly clear.  The upper abdomen is unremarkable.  Stable left adrenal gland mass.  IMPRESSION:  1.  No CT findings for pulmonary embolism. 2.  Stable tortuosity, ectasia and calcification of the thoracic aorta. 3.  Much improved right lower lobe lung process which was likely rounded atelectasis.  Some residual scarring and atelectasis. 4.  Stable dense pleural calcifications in the right hemithorax likely due to previous trauma or infection. 5.  Advanced emphysematous changes.   Original Report Authenticated By: Rudie Meyer, M.D.    Admission HPI: Mr. Odette is a 76 year old white male with PMH of Polycythemia vera, CHF (systolic 35-30%), Ischemic cardiomyopathy, history of DVTs, CAD--defibrillator 01/11/12, HTN, and GERD presenting to the ED with complaints of worsening shortness of breath this morning. He claims starting from midnight he was having difficulty breathing but was able to fall asleep, however, when he woke up this morning, he could not catch his breath and called EMS. He apparently called EMS the night before and improved after receiving a breathing  treatment, however, his symptoms returned again this morning. Mr. Hoffer also endorses chronic productive cough with white sputum and smokes approximately 6 small cigars a day. Upon arrival to ED, he was noted to have ST elevation on EKG. He was evaluated by cardiology and thought more likely to have acute on chronic respiratory failure. He denies any fever, chills, N/V/D, headaches, chest pain, abdominal pain, or any urinary complaints at this time.  Of note, Mr. Marvin sleeps with one pillow at night however has to sleep with head of bed elevated to breath better. He saw Dr. Swaziland in the office on 12/3 with similar complaints and pBNP was only 125. His potassium was noted to be elevated to 5.6 at that time with Cr 1.7 and his spironolactone was subsequently held. He was also supposed to establish primary care with [facility]--Dr. Felicity Coyer, however that was discontinued. In the ED today, he received a breathing treatment and IV solumedrol 125mg  x1 and reports significant improvement in breathing. Oxygen saturation upon ambulation ranged from 89-86% with pulse ~86bpm. At rest on room air he was saturating 93-95%.   Hospital Course by problem list:  Acute-on-chronic respiratory failure: presented with complaints of worsening shortness of breath at rest, much improved with breathing treatments and steroids.  Thought most likely secondary to COPD exacerbation given worsening cough and shortness of breath.  He has a hx of severe COPD with FEV1/FVC 0.40. CXR on admission: small right and trace left pleural effusions, abnormal interstitial accentuation noted b/l, indistinct airspace opacity right lung base. PNA  Was thought to be less likely given chronic cxr appearance, afebrile, and no leukocytosis.  He has been treated in the past for RLL PNA in 2012 with similar changes seen on follow up imaging. Consideration was also given to possible malignancy due to significant ongoing smoking history and CT chest was done. PE was  essentially ruled out by CT angio chest: no supportive PE findings on CT, much improved RLL process--likely rounded atelectasis. Stable dense pleural calcifications in right hemithorax. Advanced emphysematous changes. CHF exacerbation was possibly also a contributing factor given symptoms of orthopnea, dyspnea on exertion, significant cardiac history, elevated proBNP 939.9 (increased from 125 on 10/09/12). ACS workup was essentially negative with troponin x3.     Polycythemia vera--Followed by Dr. Darnelle Catalan at [facility]. Undergoing phlebotomy--last session 07/19/2012. Hb 13.8 on admission with platelets 144 on admission.  Hb on discharge 12.6.  Will need to follow up with Dr. Darnelle Catalan    CAD (coronary artery disease)--Hx of CAD--07/2011: anterior apical MI s/p BMS to LAD. Cath 07/15/2011: occluded LAD, 40% LCX, normal RCA, EF 30%. Echo 12/2011: 25-30%, mildly dilated LV with concentric hypertrophy, severely reduced systolic function, akinesis of myocardium. Follows with Dr. Swaziland at [place]. Recently was seen on 12/3 for complaints of dyspnea. On home regimen of: ASA, Lipitor 80mg , coreg 25mg  BID, lasix 20mg  qd, lisinopril 20mg  BID, and aldactone 25mg  QD.  Spironolactone was held on 10/09/12 given hyperkalemia and AKI. ProBNP: 939, increased from 10/09/12: 125. Troponin x3 negative.  Cardiology was consulted who suspected likely pulmonary origin of complaints.  Will need to follow up with Dr. Swaziland as outpatient.  BP medications held during admission and on discharge secondary to hypotension and hyperkalemia.  Discharged on ASA, lipitor, and lasix.  Will need to restart HTN meds with pcp and cardiology when stable.     HTN (hypertension)--BP on admission 118/58. On home regimen, coreg, lasix, lisinopril, and aldactone. Noted to by hypotensive during hospital course which improved.  ACEI and BB were held during hospital course and on discharge in setting of AKI and  and spironolactone in setting of hyperkalemia  and hypotension    GERD (gastroesophageal reflux disease)--on protonix at home, continued during hospital course and discharged back on home medication.     Hyperlipidemia--lipid panel 12/2011: LDL 54, HDL:35.30, CHOL:106, TG: 85. On lipitor 80mg  QD at home.  Continued Lipitor during admission and on discharge.      Acute renal failure--Cr on admission: 1.71, GFR: 38. Possibly secondary to hypovolemia/pre-renal vs. Renal causes infectious? vs. Medication induced? Obstruction seems unlikely given lack of supporting symptoms and producing sufficient urine. ACEi held in setting of AKI. U/A during admission: 15 ketones, 30 protein, negative nitrite, leukocytes and Hb.  Microalbumin/cr ratio: 25.2, microalbumin: 2.61, Cr: 103.5.  Fractional excretion of urea: 635.  Cr improved during hospital course.  Cr on discharge 1.64.  Will need to follow up renal function on discharge with pcp.    Discharge Vitals:  BP 112/55  Pulse 66  Temp 98.9 F (37.2 C) (Oral)  Resp 18  Ht 6' (1.829 m)  Wt 185 lb 6.5 oz (84.1 kg)  BMI 25.15 kg/m2  SpO2 94%  Discharge Labs:  Results for orders placed during the hospital encounter of 10/12/12 (from the past 24 hour(s))  TROPONIN I     Status: Normal   Collection Time   10/12/12  5:10 PM      Component Value Range   Troponin I <0.30  <0.30 ng/mL  URINALYSIS, ROUTINE W REFLEX MICROSCOPIC     Status: Abnormal   Collection Time   10/12/12  9:52 PM      Component Value Range   Color, Urine YELLOW  YELLOW   APPearance CLEAR  CLEAR   Specific Gravity, Urine >1.046 (*) 1.005 - 1.030   pH 5.0  5.0 - 8.0   Glucose, UA NEGATIVE  NEGATIVE mg/dL   Hgb urine dipstick NEGATIVE  NEGATIVE   Bilirubin Urine NEGATIVE  NEGATIVE   Ketones, ur NEGATIVE  NEGATIVE mg/dL   Protein, ur NEGATIVE  NEGATIVE mg/dL   Urobilinogen, UA 0.2  0.0 - 1.0 mg/dL   Nitrite NEGATIVE  NEGATIVE   Leukocytes, UA TRACE (*) NEGATIVE  MICROALBUMIN / CREATININE URINE RATIO     Status: Abnormal    Collection Time   10/12/12  9:52 PM      Component Value Range  Microalb, Ur 2.61 (*) 0.00 - 1.89 mg/dL   Creatinine, Urine 161.0     Microalb Creat Ratio 25.2  0.0 - 30.0 mg/g  UREA NITROGEN, URINE     Status: Normal   Collection Time   10/12/12  9:52 PM      Component Value Range   Urea Nitrogen, Ur 635    URINE MICROSCOPIC-ADD ON     Status: Normal   Collection Time   10/12/12  9:52 PM      Component Value Range   Squamous Epithelial / LPF RARE  RARE   WBC, UA 3-6  <3 WBC/hpf   RBC / HPF 3-6  <3 RBC/hpf  TROPONIN I     Status: Normal   Collection Time   10/12/12 11:49 PM      Component Value Range   Troponin I <0.30  <0.30 ng/mL  BASIC METABOLIC PANEL     Status: Abnormal   Collection Time   10/13/12  6:10 AM      Component Value Range   Sodium 137  135 - 145 mEq/L   Potassium 5.4 (*) 3.5 - 5.1 mEq/L   Chloride 104  96 - 112 mEq/L   CO2 24  19 - 32 mEq/L   Glucose, Bld 124 (*) 70 - 99 mg/dL   BUN 39 (*) 6 - 23 mg/dL   Creatinine, Ser 9.60 (*) 0.50 - 1.35 mg/dL   Calcium 9.7  8.4 - 45.4 mg/dL   GFR calc non Af Amer 40 (*) >90 mL/min   GFR calc Af Amer 46 (*) >90 mL/min  CBC     Status: Abnormal   Collection Time   10/13/12  6:10 AM      Component Value Range   WBC 16.0 (*) 4.0 - 10.5 K/uL   RBC 5.36  4.22 - 5.81 MIL/uL   Hemoglobin 12.6 (*) 13.0 - 17.0 g/dL   HCT 09.8  11.9 - 14.7 %   MCV 75.2 (*) 78.0 - 100.0 fL   MCH 23.5 (*) 26.0 - 34.0 pg   MCHC 31.3  30.0 - 36.0 g/dL   RDW 82.9 (*) 56.2 - 13.0 %   Platelets 134 (*) 150 - 400 K/uL   Signed: Darden Palmer 10/14/2012, 9:56 AM   Time Spent on Discharge: 45 minutes Services Ordered on Discharge: none Equipment Ordered on Discharge: none

## 2012-10-13 NOTE — Progress Notes (Signed)
Pt had an episode of Sinus Bradycardia @ HR 48, BP runs slow at 87/54 with O2sat 96%, denies chest pain,  dizziness, lightheadedness and not in respiratory distress. Dr. Newton Pigg, [facility] notified and ordered to hold the Carvedilol ( Coreg) tablet due at 7am today. Will further monitor the patient.

## 2012-10-13 NOTE — H&P (Signed)
Internal Medicine Attending Admission Note Date: 10/13/2012  Patient name: JANIE STROTHMAN Medical record number: [id number] Date of birth: [birth year] Age: 76 y.o. Gender: male  I saw and evaluated the patient. I reviewed the resident's note and I agree with the resident's findings and plan as documented in the resident's note.  Chief Complaint(s): Shortness of breath  History - key components related to admission: Mr Gossman is a 76 year old man with PMH most significant for polycythemia vera, systolic CHF, ischemic cardiomyopathy s/p ICD, CAD, HTN and COPD who comes in with worsening shortness of breath. Patient has chronic cough but endorsed acute worsening. Code STEMI was called initially due to EKG changes which was called off eventually as his symptoms were found to be secondary to acute on chronic respiratory failure.   He does not have PCP at this time but sees [facility] for his CAD and CHF.  Mr Swayne tells me today that he feels 100% better and is ready to go home. He denies ay chest pain, SOB, palpitations.  10 point review of system was reviewed and negative except noted above.     Physical Exam - key components related to admission:  Filed Vitals:   10/13/12 0550 10/13/12 0826 10/13/12 0840 10/13/12 1333  BP: 88/65 88/48  112/55  Pulse: 68 64  66  Temp: 97.8 F (36.6 C)   98.9 F (37.2 C)  TempSrc: Oral   Oral  Resp:    18  Height:      Weight:      SpO2: 95% 96% 94% 94%  BP 112/55  Pulse 66  Temp 98.9 F (37.2 C) (Oral)  Resp 18  Ht 6' (1.829 m)  Wt 185 lb 6.5 oz (84.1 kg)  BMI 25.15 kg/m2  SpO2 94%  General Appearance:    Alert, cooperative, no distress, appears stated age  Head:    Normocephalic, without obvious abnormality, atraumatic  Eyes:    PERRL, conjunctiva/corneas clear, EOM's intact, fundi    benign, both eyes       Ears:    Normal TM's and external ear canals, both ears  Nose:   Nares normal, septum midline, mucosa normal, no drainage    or sinus tenderness  Throat:   Lips, mucosa, and tongue normal; teeth and gums normal  Neck:   Supple, symmetrical, trachea midline, no adenopathy;       thyroid:  No enlargement/tenderness/nodules; no carotid   bruit or JVD  Back:     Symmetric, no curvature, ROM normal, no CVA tenderness  Lungs:     Clear to auscultation bilaterally, respirations unlabored  Chest wall:    No tenderness or deformity  Heart:    Regular rate and rhythm, S1 and S2 normal, no murmur, rub   or gallop  Abdomen:     Soft, non-tender, bowel sounds active all four quadrants,    no masses, no organomegaly  Extremities:   Extremities normal, atraumatic, no cyanosis or edema  Pulses:   2+ and symmetric all extremities  Skin:   Skin color, texture, turgor normal, no rashes or lesions  Lymph nodes:   Cervical, supraclavicular, and axillary nodes normal  Neurologic:   CNII-XII intact. Normal strength, sensation and reflexes      throughout     Lab results:   Basic Metabolic Panel:  Basename 10/13/12 0610 10/12/12 0928  NA 137 139  K 5.4* 5.3*  CL 104 103  CO2 24 26  GLUCOSE 124* 106*  BUN  39* 32*  CREATININE 1.64* 1.71*  CALCIUM 9.7 9.9  MG -- --  PHOS -- --   CBC:  Basename 10/13/12 0610 10/12/12 0928  WBC 16.0* 10.1  NEUTROABS -- 7.0  HGB 12.6* 13.8  HCT 40.3 44.0  MCV 75.2* 76.0*  PLT 134* 144*   Cardiac Enzymes:  Basename 10/12/12 2349 10/12/12 1710 10/12/12 0932  CKTOTAL -- -- --  CKMB -- -- --  CKMBINDEX -- -- --  TROPONINI <0.30 <0.30 <0.30   Coagulation:  Basename 10/12/12 0928  INR 0.95     Imaging results:  Dg Chest 2 View  10/12/2012  *RADIOLOGY REPORT*  Clinical Data: Pt c/o ongoing sob that has worsened lately. Productive Cough. Smoker 1/2 pack cigars/day, hx defibrillator and stent. No other chest complaints.  CHEST - 2 VIEW  Comparison: 03/26/2012  Findings: AICD remains in place.  Right pleural effusion with blunting of the right costophrenic angle.  Trace left  pleural effusion.  Bilateral interstitial opacity noted. Indistinct airspace opacity at the right lung base.  No cardiomegaly.  Aortic atherosclerosis noted.  Thoracic spondylosis is present.  IMPRESSION:  1.  Small right and trace left pleural effusions. 2.  Abnormal interstitial accentuation noted bilaterally.  No cardiomegaly is observed. 3.  Indistinct airspace opacity, right lung base, potentially atelectasis, pneumonia, or malignancy.  CT chest with contrast could be utilized for further characterization.  If there is concern for pulmonary embolus, CTA pulmonary embolus protocol might be useful.   Original Report Authenticated By: Gaylyn Rong, M.D.    Ct Angio Chest Pe W/cm &/or Wo Cm  10/12/2012  *RADIOLOGY REPORT*  Clinical Data: Worsening shortness of breath.  CT ANGIOGRAPHY CHEST  Technique:  Multidetector CT imaging of the chest using the standard protocol during bolus administration of intravenous contrast. Multiplanar reconstructed images including MIPs were obtained and reviewed to evaluate the vascular anatomy.  Contrast: 80mL OMNIPAQUE IOHEXOL 350 MG/ML SOLN  Comparison: Chest CT 04/03/2012.  Findings: The chest wall is stable.  Chest wall collaterals are noted on the right side.  This may be due to the subclavian vein stenosis.  No chest wall mass lesions, supraclavicular or axillary adenopathy.  The bony thorax is intact.  No destructive bone lesions or spinal canal compromise.  The heart is normal in size.  No pericardial effusion.  No mediastinal or hilar lymphadenopathy.  The esophagus is grossly normal.  The aorta demonstrates moderate atherosclerotic calcifications.  Mild tortuosity and ectasia but no focal aneurysm or dissection.  Dense coronary artery calcifications are noted.  The pulmonary arterial tree is well opacified.  No filling defects to suggest pulmonary emboli.  Examination of the lung parenchyma demonstrates advanced emphysematous changes.  There is dense pleural  calcification on the right side likely due to remote trauma or infection.  The right lower lobe opacity demonstrated on the prior CT scan is much improved.  It had the appearance of rounded atelectasis.  No worrisome mass lesion.  Residual scarring changes.  The left lung is grossly clear.  The upper abdomen is unremarkable.  Stable left adrenal gland mass.  IMPRESSION:  1.  No CT findings for pulmonary embolism. 2.  Stable tortuosity, ectasia and calcification of the thoracic aorta. 3.  Much improved right lower lobe lung process which was likely rounded atelectasis.  Some residual scarring and atelectasis. 4.  Stable dense pleural calcifications in the right hemithorax likely due to previous trauma or infection. 5.  Advanced emphysematous changes.   Original Report Authenticated By:  Rudie Meyer, M.D.     Other results: EKG: EKG: 79bpm, left axis deviation, t wave inversions in anterior precordial lateral leads, ST elevation in V3-V5,    Assessment & Plan by Problem:  Principal Problem:  *Acute-on-chronic respiratory failure Active Problems:  Polycythemia vera  CAD (coronary artery disease)  HTN (hypertension)  GERD (gastroesophageal reflux disease)  Hyperlipidemia  Acute renal failure   Patient's SOB is most likely 2/2 COPD exacerbation with breathing now much better with steroids and nebs. Patient will benefit from antibiotics as well and we will add it today. Patient seems to be ready for discharge with steroid taper, continued nebs and antibiotic with close follow up with cardiology and clinic. Dr Swaziland wo is his regular cardiologist and thinks that EKG changes are chronic and given that CE have been negative so far, no inpatient cardiology evaluation is warranted at this time.   Lars Mage MD Faculty-Internal Medicine Residency Program Pager [phone] 7:16 PM 10/13/2012

## 2012-10-13 NOTE — Progress Notes (Addendum)
Subjective: Mr. Allbaugh was seen and examined at bedside.  He claims to be breathing much better than when he first come to the ED.  He says the breathing treatments help tremendously.  Overnight, he was noted to have episode of sinus bradycardia with HR 48BPM and hypotension with BP systolic 80-90s.  Saturating 96% on 2-4L [place] O2.  He denies any chest pain, N/V/D, fever, chills, abdominal pain, headaches, or dysuria at this time. o2 sat upon ambulation 90%.    Objective: Vital signs in last 24 hours: Filed Vitals:   10/13/12 0437 10/13/12 0550 10/13/12 0826 10/13/12 0840  BP: 87/54 88/65 88/48    Pulse: 48 68 64   Temp:  97.8 F (36.6 C)    TempSrc:  Oral    Resp:      Height:      Weight:      SpO2: 96% 95% 96% 94%   Weight change:   Intake/Output Summary (Last 24 hours) at 10/13/12 1120 Last data filed at 10/13/12 0854  Gross per 24 hour  Intake    620 ml  Output    600 ml  Net     20 ml   Vitals reviewed. General: resting in bed, NAD HEENT: PERRL, EOMI, no scleral icterus Cardiac: RRR, no rubs, murmurs or gallops Pulm: clear to auscultation bilaterally, no wheezes, rales, or rhonchi Abd: soft, nontender, nondistended, BS present Ext: warm and well perfused, no pedal edema Neuro: alert and oriented X3, cranial nerves II-XII grossly intact, strength and sensation to light touch equal in bilateral upper and lower extremities  Lab Results: Basic Metabolic Panel:  Lab 10/13/12 1610 10/12/12 0928  NA 137 139  K 5.4* 5.3*  CL 104 103  CO2 24 26  GLUCOSE 124* 106*  BUN 39* 32*  CREATININE 1.64* 1.71*  CALCIUM 9.7 9.9  MG -- --  PHOS -- --   CBC:  Lab 10/13/12 0610 10/12/12 0928  WBC 16.0* 10.1  NEUTROABS -- 7.0  HGB 12.6* 13.8  HCT 40.3 44.0  MCV 75.2* 76.0*  PLT 134* 144*   Cardiac Enzymes:  Lab 10/12/12 2349 10/12/12 1710 10/12/12 0932  CKTOTAL -- -- --  CKMB -- -- --  CKMBINDEX -- -- --  TROPONINI <0.30 <0.30 <0.30   BNP:  Lab 10/12/12 0932 10/09/12  0925  PROBNP 939.9* 125.0*   Coagulation:  Lab 10/12/12 0928  LABPROT 12.6  INR 0.95   Urinalysis:  Lab 10/12/12 2152  COLORURINE YELLOW  LABSPEC >1.046*  PHURINE 5.0  GLUCOSEU NEGATIVE  HGBUR NEGATIVE  BILIRUBINUR NEGATIVE  KETONESUR NEGATIVE  PROTEINUR NEGATIVE  UROBILINOGEN 0.2  NITRITE NEGATIVE  LEUKOCYTESUR TRACE*   Studies/Results: Dg Chest 2 View  10/12/2012  *RADIOLOGY REPORT*  Clinical Data: Pt c/o ongoing sob that has worsened lately. Productive Cough. Smoker 1/2 pack cigars/day, hx defibrillator and stent. No other chest complaints.  CHEST - 2 VIEW  Comparison: 03/26/2012  Findings: AICD remains in place.  Right pleural effusion with blunting of the right costophrenic angle.  Trace left pleural effusion.  Bilateral interstitial opacity noted. Indistinct airspace opacity at the right lung base.  No cardiomegaly.  Aortic atherosclerosis noted.  Thoracic spondylosis is present.  IMPRESSION:  1.  Small right and trace left pleural effusions. 2.  Abnormal interstitial accentuation noted bilaterally.  No cardiomegaly is observed. 3.  Indistinct airspace opacity, right lung base, potentially atelectasis, pneumonia, or malignancy.  CT chest with contrast could be utilized for further characterization.  If there is concern  for pulmonary embolus, CTA pulmonary embolus protocol might be useful.   Original Report Authenticated By: Gaylyn Rong, M.D.    Ct Angio Chest Pe W/cm &/or Wo Cm  10/12/2012  *RADIOLOGY REPORT*  Clinical Data: Worsening shortness of breath.  CT ANGIOGRAPHY CHEST  Technique:  Multidetector CT imaging of the chest using the standard protocol during bolus administration of intravenous contrast. Multiplanar reconstructed images including MIPs were obtained and reviewed to evaluate the vascular anatomy.  Contrast: 80mL OMNIPAQUE IOHEXOL 350 MG/ML SOLN  Comparison: Chest CT 04/03/2012.  Findings: The chest wall is stable.  Chest wall collaterals are noted on the  right side.  This may be due to the subclavian vein stenosis.  No chest wall mass lesions, supraclavicular or axillary adenopathy.  The bony thorax is intact.  No destructive bone lesions or spinal canal compromise.  The heart is normal in size.  No pericardial effusion.  No mediastinal or hilar lymphadenopathy.  The esophagus is grossly normal.  The aorta demonstrates moderate atherosclerotic calcifications.  Mild tortuosity and ectasia but no focal aneurysm or dissection.  Dense coronary artery calcifications are noted.  The pulmonary arterial tree is well opacified.  No filling defects to suggest pulmonary emboli.  Examination of the lung parenchyma demonstrates advanced emphysematous changes.  There is dense pleural calcification on the right side likely due to remote trauma or infection.  The right lower lobe opacity demonstrated on the prior CT scan is much improved.  It had the appearance of rounded atelectasis.  No worrisome mass lesion.  Residual scarring changes.  The left lung is grossly clear.  The upper abdomen is unremarkable.  Stable left adrenal gland mass.  IMPRESSION:  1.  No CT findings for pulmonary embolism. 2.  Stable tortuosity, ectasia and calcification of the thoracic aorta. 3.  Much improved right lower lobe lung process which was likely rounded atelectasis.  Some residual scarring and atelectasis. 4.  Stable dense pleural calcifications in the right hemithorax likely due to previous trauma or infection. 5.  Advanced emphysematous changes.   Original Report Authenticated By: Rudie Meyer, M.D.    Medications: I have reviewed the patient's current medications. Scheduled Meds:   . [COMPLETED] albuterol  2.5 mg Nebulization NOW  . albuterol  2.5 mg Nebulization Q6H  . aspirin EC  81 mg Oral Daily  . atorvastatin  80 mg Oral q1800  . doxycycline  100 mg Oral Q12H  . enoxaparin (LOVENOX) injection  30 mg Subcutaneous Q24H  . ipratropium  0.5 mg Nebulization Q6H  . [COMPLETED]  methylPREDNISolone (SOLU-MEDROL) injection  125 mg Intravenous Once  . pantoprazole  40 mg Oral Daily  . predniSONE  40 mg Oral Q breakfast  . [DISCONTINUED] carvedilol  25 mg Oral BID  . [DISCONTINUED] carvedilol  25 mg Oral BID WC  . [DISCONTINUED] furosemide  20 mg Oral Daily  . [DISCONTINUED] heparin  5,000 Units Subcutaneous Q8H   Continuous Infusions:  PRN Meds:.albuterol, [COMPLETED] iohexol Assessment/Plan: Mr. Kulikowski is a 76 year old white male with PMH of Polycythemia vera, extensive cardiac history, and COPD admitted for acute respiratory failure.  Acute-on-chronic respiratory failure--likely secondary to COPD exacerbation  given worsening cough and shortness of breath, however, no subjective change in sputum quantity or quality.  Presented with complaints of dyspnea at rest. Has hx of severe COPD with FEV1/FVC 0.40 and CHF with estimated EF 25-30%. CXR on admission: small right and trace left pleural effusions, abnormal interstitial accentuation noted b/l, indistinct airspace opacity right  lung base.  Other differentials include possible RLL pneumonia as demonstrated by CXR, however, afebrile, no leukocytosis, prior RLL PNA in 2012 with similar changes seen on follow up imaging. Consideration should also be given to malignancy due to significant ongoing smoking history and possible mass on imaging. PE essentially ruled out by CT angio chest: no supportive PE findings on CT, much improved RLL process--likely rounded atelectasis.  Stable dense pleural calcifications in right hemithorax.  Advanced emphysematous changes.  CHF exacerbation is also possible given symptoms of orthopnea, dyspnea on exertion, significant cardiac history, elevated proBNP 939.9 (increased from 125 on 10/09/12).  ACS workup up essentially negative.  Hx of CAD--07/2011: anterior apical MI s/p BMS to LAD. Cath 07/15/2011: occluded LAD, 40% LCX, normal RCA, EF 30%. Echo 12/2011: 25-30%, mildly dilated LV with concentric  hypertrophy, severely reduced systolic function, akinesis of myocardium. Troponin x3 negative and followed by cardiology who suspect likely pulmonary origin of complaints.    -continue duonebs  -continue doxycycline--day #2 -prednisone taper started today: 40mg  po -ASA  -Statin  -BB--held in setting of hypotension -ACE I held in setting of AKI  -Protonix  -continuous pulse oximetry and with ambulation -oxygen PRN [place] 2L, keep o2 sat >92%   Acute renal failure--Cr on admission: 1.71--> improved to 1.64.  Possibly secondary to hypovolemia/pre-renal vs. Renal causes infectious? vs. Medication induced? Obstruction seems unlikely given lack of supporting symptoms and producing sufficient urine.  - u/a: 15 ketones, 30 protein, negative nitrite, leukocytes and Hb - microalbumin/cr ratio: 25.2, microalbumin: 2.61, Cr: 103.5  - Fractional excretion of urea: 635  - continue to monitor   Polycythemia vera--Followed by Dr. Darnelle Catalan at [facility]. Undergoing phlebotomy--last session 07/19/2012. Hb 13.8 on admission with platelets 144 on admission. -platelets down to 134 today and Hb 12.6  -will need to follow up with Dr. Darnelle Catalan -continue to monitor   CAD (coronary artery disease)--s/p BMS to LAD and defibrillator in place. 07/2011: anterior apical MI s/p BMS to LAD. Cath 07/15/2011: occluded LAD, 40% LCX, normal RCA, EF 30%. Echo 12/2011: 25-30%, mildly dilated LV with concentric hypertrophy, severely reduced systolic function, akinesis of myocardium. Follows with Dr. [place] at [place]. Recently seen on 12/3 for complaints of dyspnea. On home regimen of: ASA, Lipitor 80mg , coreg 25mg  BID, lasix 20mg  qd, lisinopril 20mg  BID, and aldactone 25mg  QD. Spironolactone was held on 10/09/12 given hyperkalemia and AKI. ProBNP: 939, increased from 10/09/12: 125.  Troponin x3 negative. -f//u cardiology--no evidence of MI by enzymes with chronic EKG changes.   -continue asa, lasix, and lipitor  -holding BB and ACEI  in setting of AKI and spironolactone in setting of hyperkalemia and hypotension  HTN (hypertension)--BP on admission 118/58. On home regimen, coreg, lasix, lisinopril, and aldactone.  -hold ACEI and BB in setting of AKI and spironolactone in setting of hyperkalemia and hypotension -continue to monitor  -orthostatic vital signs   GERD (gastroesophageal reflux disease)--on protonix at home 40mg   -continue protonix   Hyperlipidemia--lipid panel 16109: LDL 54, HDL:35.30, CHOL:106, TG: 85. On lipitor 80mg  QD  -continue lipitor   Diet: Heart healthy  DVT Ppx: heparin  Dispo:d/c home today The patient does not have a current PCP , therefore will be requiring [facility] follow-up after discharge.  The patient does not have transportation limitations that hinder transportation to clinic appointments.   LOS: 1 day   Darden Palmer 10/13/2012, 11:20 AM

## 2012-10-13 NOTE — Progress Notes (Signed)
TELEMETRY: Reviewed telemetry pt in NSR rate 80: Filed Vitals:   10/13/12 0437 10/13/12 0550 10/13/12 0826 10/13/12 0840  BP: 87/54 88/65 88/48    Pulse: 48 68 64   Temp:  97.8 F (36.6 C)    TempSrc:  Oral    Resp:      Height:      Weight:      SpO2: 96% 95% 96% 94%    Intake/Output Summary (Last 24 hours) at 10/13/12 1102 Last data filed at 10/13/12 0854  Gross per 24 hour  Intake    620 ml  Output    600 ml  Net     20 ml    SUBJECTIVE Denies any SOB or chest pain this am. No cough.  Denies dizzyness. LABS: Basic Metabolic Panel:  Basename 10/13/12 0610 10/12/12 0928  NA 137 139  K 5.4* 5.3*  CL 104 103  CO2 24 26  GLUCOSE 124* 106*  BUN 39* 32*  CREATININE 1.64* 1.71*  CALCIUM 9.7 9.9  MG -- --  PHOS -- --   CBC:  Basename 10/13/12 0610 10/12/12 0928  WBC 16.0* 10.1  NEUTROABS -- 7.0  HGB 12.6* 13.8  HCT 40.3 44.0  MCV 75.2* 76.0*  PLT 134* 144*   Cardiac Enzymes:  Basename 10/12/12 2349 10/12/12 1710 10/12/12 0932  CKTOTAL -- -- --  CKMB -- -- --  CKMBINDEX -- -- --  TROPONINI <0.30 <0.30 <0.30   BNP: 940   Radiology/Studies:  Dg Chest 2 View  10/12/2012  *RADIOLOGY REPORT*  Clinical Data: Pt c/o ongoing sob that has worsened lately. Productive Cough. Smoker 1/2 pack cigars/day, hx defibrillator and stent. No other chest complaints.  CHEST - 2 VIEW  Comparison: 03/26/2012  Findings: AICD remains in place.  Right pleural effusion with blunting of the right costophrenic angle.  Trace left pleural effusion.  Bilateral interstitial opacity noted. Indistinct airspace opacity at the right lung base.  No cardiomegaly.  Aortic atherosclerosis noted.  Thoracic spondylosis is present.  IMPRESSION:  1.  Small right and trace left pleural effusions. 2.  Abnormal interstitial accentuation noted bilaterally.  No cardiomegaly is observed. 3.  Indistinct airspace opacity, right lung base, potentially atelectasis, pneumonia, or malignancy.  CT chest with  contrast could be utilized for further characterization.  If there is concern for pulmonary embolus, CTA pulmonary embolus protocol might be useful.   Original Report Authenticated By: Gaylyn Rong, M.D.    Ct Angio Chest Pe W/cm &/or Wo Cm  10/12/2012  *RADIOLOGY REPORT*  Clinical Data: Worsening shortness of breath.  CT ANGIOGRAPHY CHEST  Technique:  Multidetector CT imaging of the chest using the standard protocol during bolus administration of intravenous contrast. Multiplanar reconstructed images including MIPs were obtained and reviewed to evaluate the vascular anatomy.  Contrast: 80mL OMNIPAQUE IOHEXOL 350 MG/ML SOLN  Comparison: Chest CT 04/03/2012.  Findings: The chest wall is stable.  Chest wall collaterals are noted on the right side.  This may be due to the subclavian vein stenosis.  No chest wall mass lesions, supraclavicular or axillary adenopathy.  The bony thorax is intact.  No destructive bone lesions or spinal canal compromise.  The heart is normal in size.  No pericardial effusion.  No mediastinal or hilar lymphadenopathy.  The esophagus is grossly normal.  The aorta demonstrates moderate atherosclerotic calcifications.  Mild tortuosity and ectasia but no focal aneurysm or dissection.  Dense coronary artery calcifications are noted.  The pulmonary arterial tree is well opacified.  No filling defects to suggest pulmonary emboli.  Examination of the lung parenchyma demonstrates advanced emphysematous changes.  There is dense pleural calcification on the right side likely due to remote trauma or infection.  The right lower lobe opacity demonstrated on the prior CT scan is much improved.  It had the appearance of rounded atelectasis.  No worrisome mass lesion.  Residual scarring changes.  The left lung is grossly clear.  The upper abdomen is unremarkable.  Stable left adrenal gland mass.  IMPRESSION:  1.  No CT findings for pulmonary embolism. 2.  Stable tortuosity, ectasia and calcification  of the thoracic aorta. 3.  Much improved right lower lobe lung process which was likely rounded atelectasis.  Some residual scarring and atelectasis. 4.  Stable dense pleural calcifications in the right hemithorax likely due to previous trauma or infection. 5.  Advanced emphysematous changes.   Original Report Authenticated By: Rudie Meyer, M.D.     PHYSICAL EXAM General: Well developed, well nourished, in no acute distress. Head: Normocephalic, atraumatic, sclera non-icteric, no xanthomas, nares are without discharge. Neck: Negative for carotid bruits. JVD not elevated. Lungs: Diminished BS throughout. No rales. Breathing is unlabored. Heart: RRR S1 S2 without murmurs, rubs, or gallops.  Abdomen: Soft, non-tender, non-distended with normoactive bowel sounds. No hepatomegaly. No rebound/guarding. No obvious abdominal masses. Msk:  Strength and tone appears normal for age. Extremities: No clubbing, cyanosis or edema.  Distal pedal pulses are 2+ and equal bilaterally. Neuro: Alert and oriented X 3. Moves all extremities spontaneously. Psych:  Responds to questions appropriately with a normal affect.  ASSESSMENT AND PLAN: 1. Acute respiratory failure. Probably related to acute COPD exacerbation. No evidence of volume overload. Weight stable. No edema. Chest CT shows no evidence of PE or malignancy. +emphysema. Suspect CXR findings were atelectasis.  2. Chronic systolic CHF. Euvolemic. ACEi and coreg on hold for hypotension. Aldactone discontinued recently due to hyperkalemia and increased creatinine.   3. CAD s/p anterior MI. Prior PCI on LAD but very late in presentation. No evidence of MI now by enzymes. Ecg is chronically abnormal.   4. Chronic tobacco abuse.  5. Polycythemia vera.  Principal Problem:  *Acute-on-chronic respiratory failure Active Problems:  Polycythemia vera  CAD (coronary artery disease)  HTN (hypertension)  GERD (gastroesophageal reflux disease)  Hyperlipidemia   Acute renal failure    Signed, Morgyn Marut [place] MD,FACC 10/13/2012 11:10 AM

## 2012-10-15 NOTE — Progress Notes (Signed)
Utilization Review Completed.   Tesslyn Baumert, RN, BSN Nurse Case Manager  [phone]  

## 2012-10-23 ENCOUNTER — Encounter: Payer: Self-pay | Admitting: Internal Medicine

## 2012-10-23 ENCOUNTER — Telehealth: Payer: Self-pay | Admitting: Internal Medicine

## 2012-10-23 ENCOUNTER — Ambulatory Visit (INDEPENDENT_AMBULATORY_CARE_PROVIDER_SITE_OTHER): Payer: Medicare Other | Admitting: Internal Medicine

## 2012-10-23 VITALS — BP 120/73 | HR 67 | Temp 97.4°F | Ht 72.0 in | Wt 194.3 lb

## 2012-10-23 DIAGNOSIS — I2589 Other forms of chronic ischemic heart disease: Secondary | ICD-10-CM

## 2012-10-23 DIAGNOSIS — D45 Polycythemia vera: Secondary | ICD-10-CM

## 2012-10-23 DIAGNOSIS — J441 Chronic obstructive pulmonary disease with (acute) exacerbation: Secondary | ICD-10-CM

## 2012-10-23 DIAGNOSIS — N179 Acute kidney failure, unspecified: Secondary | ICD-10-CM

## 2012-10-23 DIAGNOSIS — I255 Ischemic cardiomyopathy: Secondary | ICD-10-CM

## 2012-10-23 DIAGNOSIS — J9620 Acute and chronic respiratory failure, unspecified whether with hypoxia or hypercapnia: Secondary | ICD-10-CM

## 2012-10-23 DIAGNOSIS — Z95810 Presence of automatic (implantable) cardiac defibrillator: Secondary | ICD-10-CM

## 2012-10-23 DIAGNOSIS — I5022 Chronic systolic (congestive) heart failure: Secondary | ICD-10-CM

## 2012-10-23 DIAGNOSIS — I10 Essential (primary) hypertension: Secondary | ICD-10-CM

## 2012-10-23 LAB — BASIC METABOLIC PANEL WITH GFR
CO2: 31 mEq/L (ref 19–32)
Glucose, Bld: 103 mg/dL — ABNORMAL HIGH (ref 70–99)
Potassium: 6.4 mEq/L (ref 3.5–5.3)
Sodium: 141 mEq/L (ref 135–145)

## 2012-10-23 NOTE — Patient Instructions (Addendum)
General Instructions: Follow-up with Dr. [place] and Dr. Darnelle Catalan needs to be scheduled   Treatment Goals:  Goals (1 Years of Data) as of 10/23/2012    None      Progress Toward Treatment Goals:  Treatment Goal 10/23/2012  Blood pressure at goal    Self Care Goals & Plans:  Self Care Goal 10/23/2012  Manage my medications take my medicines as prescribed; bring my medications to every visit; refill my medications on time  Monitor my health keep track of my blood pressure; keep track of my weight  Eat healthy foods eat fruit for snacks and desserts; eat more vegetables; eat foods that are low in salt; eat smaller portions; drink diet soda or water instead of juice or soda  Be physically active find an activity I enjoy; take a walk every day       Care Management & Community Referrals:

## 2012-10-23 NOTE — Progress Notes (Addendum)
  Subjective:    Patient ID: Jesus Sanders, male    DOB: Feb 28, 1936, 76 y.o.   MRN: [id number]  HPI  Pt presents for post-hosp check after admission for likely COPD-exacerbation in setting of mild CHF exacerbation. Reports feeling better.  Has hypertension but with hypotension during hospitalization thus carvedilol, spironolactone and  lisinopril held.  Pt w/o complaints of chest pain, increased sob above his baseline, palpitations, fever, chills, headaches or change in bowel habits.  Review of Systems  Constitutional: Negative for fever, chills and fatigue.  HENT: Negative for congestion.   Respiratory: Positive for shortness of breath. Negative for cough, wheezing and stridor.   Cardiovascular: Negative for chest pain, palpitations and leg swelling.  Gastrointestinal: Negative for nausea, diarrhea and blood in stool.  Neurological: Negative for dizziness, weakness and headaches.       Objective:   Physical Exam  Constitutional: He is oriented to person, place, and time. He appears well-developed and well-nourished. No distress.  HENT:  Head: Normocephalic and atraumatic.  Cardiovascular: Normal rate, regular rhythm and normal heart sounds.   Pulmonary/Chest: Effort normal and breath sounds normal. No respiratory distress. He has no wheezes. He has no rales. He exhibits no tenderness.  Abdominal: Soft. Bowel sounds are normal.  Musculoskeletal: Normal range of motion. He exhibits no edema.  Neurological: He is alert and oriented to person, place, and time.  Skin: Skin is warm and dry.  Psychiatric: He has a normal mood and affect. His behavior is normal. Judgment and thought content normal.          Assessment & Plan:  1. Acute on chronic respiratory failure: likely secondary to COPD exacerbation, improvement sob, CHF stable on lasix 20 mg prn -cont albuterol prn  2. Htn: at goal on only lasix, bp today 120/73 pulse 67 bpm, hypotension in low 100s sbp during admission with  bradycardia -cont to hold carvedilol and lisinopril  3. CHF, systolic: stable, pBNP ~900, on lasix therapy -spironolactone held -consider resuming carvedilol after f/u with Dr. Sw[place] of Cardiology  4. Acute Renal Injury: etiology unclear, recheck Creatinine today  5. Polycythemia Vera: f/u with Dr. Darnelle Catalan of Hematology  6. Ischemic Cardiomyopathy: ICD check scheduled Jan 2014   ADDENDUM: K level 6.4, asymptomatic, Dr. Anselm Jungling attempted to contact pt overnight, voicemail left, pt returned call next day, will present to clinic for EKG and stat potassium check

## 2012-10-23 NOTE — Assessment & Plan Note (Signed)
BP Readings from Last 3 Encounters:  10/23/12 120/73  10/13/12 112/55  10/09/12 138/78    Lab Results  Component Value Date   NA 137 10/13/2012   K 5.4* 10/13/2012   CREATININE 1.64* 10/13/2012    Assessment:  Blood pressure control: controlled  Progress toward BP goal:  at goal  Comments: antihypertensive held since hospitalization  Plan:  Medications:  HELD-carvedilol, lisinopril, spironolactone  Educational resources provided: brochure  Self management tools provided:    Other plans: continue to hold bp meds, bp 120/73 pulse 67 bpm today -f/u Dr. [place], re-evaluate re-starting at least carvedilol at that time

## 2012-10-23 NOTE — Telephone Encounter (Signed)
Lab called for critical value of K of 6.4.  Unclear why he has hyperkalemia because he is supposed to be off of his ACEi.  He does have renal failure which may explain his hyperkalemia.  I tried to call his home number several times and left a message for him to call back [phone] and also to go to the ED for further evaluation.

## 2012-10-23 NOTE — Addendum Note (Signed)
Addended by: Nakota Ackert-AKOSUA M on: 10/23/2012 01:47 PM   Modules accepted: Level of Service  

## 2012-10-24 ENCOUNTER — Ambulatory Visit (HOSPITAL_COMMUNITY)
Admission: RE | Admit: 2012-10-24 | Discharge: 2012-10-24 | Disposition: A | Discharge status: Home / Self Care | Payer: Medicare Other | Source: Ambulatory Visit | Attending: Internal Medicine | Admitting: Internal Medicine

## 2012-10-24 ENCOUNTER — Ambulatory Visit (INDEPENDENT_AMBULATORY_CARE_PROVIDER_SITE_OTHER): Payer: Medicare Other | Admitting: Internal Medicine

## 2012-10-24 ENCOUNTER — Telehealth: Payer: Self-pay | Admitting: Internal Medicine

## 2012-10-24 VITALS — BP 106/58 | HR 69 | Temp 97.0°F | Ht 72.0 in | Wt 193.1 lb

## 2012-10-24 DIAGNOSIS — E875 Hyperkalemia: Secondary | ICD-10-CM

## 2012-10-24 DIAGNOSIS — I509 Heart failure, unspecified: Secondary | ICD-10-CM | POA: Insufficient documentation

## 2012-10-24 DIAGNOSIS — I10 Essential (primary) hypertension: Secondary | ICD-10-CM | POA: Insufficient documentation

## 2012-10-24 DIAGNOSIS — Z95810 Presence of automatic (implantable) cardiac defibrillator: Secondary | ICD-10-CM | POA: Insufficient documentation

## 2012-10-24 DIAGNOSIS — J4489 Other specified chronic obstructive pulmonary disease: Secondary | ICD-10-CM | POA: Insufficient documentation

## 2012-10-24 DIAGNOSIS — I5022 Chronic systolic (congestive) heart failure: Secondary | ICD-10-CM | POA: Insufficient documentation

## 2012-10-24 DIAGNOSIS — I2589 Other forms of chronic ischemic heart disease: Secondary | ICD-10-CM | POA: Insufficient documentation

## 2012-10-24 DIAGNOSIS — J449 Chronic obstructive pulmonary disease, unspecified: Secondary | ICD-10-CM | POA: Insufficient documentation

## 2012-10-24 DIAGNOSIS — N289 Disorder of kidney and ureter, unspecified: Secondary | ICD-10-CM | POA: Insufficient documentation

## 2012-10-24 LAB — BASIC METABOLIC PANEL WITH GFR
BUN: 32 mg/dL — ABNORMAL HIGH (ref 6–23)
Calcium: 9.6 mg/dL (ref 8.4–10.5)
Chloride: 100 mEq/L (ref 96–112)
GFR, Est African American: 49 mL/min — ABNORMAL LOW
GFR, Est Non African American: 43 mL/min — ABNORMAL LOW
Glucose, Bld: 94 mg/dL (ref 70–99)
Potassium: 5.1 mEq/L (ref 3.5–5.3)
Sodium: 136 mEq/L (ref 135–145)

## 2012-10-24 NOTE — Assessment & Plan Note (Addendum)
Likely Spurious, repeat K=5.1, EKG without significant changes from prior on 10/12/2012. No peaked T-waves.  Lab 10/24/12 1327 10/23/12 1023  K 5.1 6.4*

## 2012-10-24 NOTE — Telephone Encounter (Signed)
Pt called [phone] at 10 am. He was seen by Dr. Bosie Clos yesterday and his K came back 6.4 and so we tried to contact him but in vain. He is overall asymptomatic.  Called [facility] front desk and Chilon arranged for a appt with Dr. Bosie Clos at 1:15 today. Will get BMP and EKG. Pt contacted back. He will be in clinic at 1:15

## 2012-10-24 NOTE — Progress Notes (Addendum)
  Subjective:    Patient ID: Jesus Sanders, male    DOB: 06/08/1936, 76 y.o.   MRN: [id number]  HPI  Pt evaluated yesterday for post-hosp f/u  Of COPD exac and Acute kidney insufficiency.  Potassium returned elevated at 6.4 and pt presents today for EKG and Stat potassium level check.  He is asymptomatic w/o chest pain, shortness of breath or palpitations.  Review of Systems As per HPI    Objective:   Physical Exam  Constitutional: He is oriented to person, place, and time. He appears well-developed and well-nourished. No distress.  HENT:  Head: Normocephalic and atraumatic.  Eyes: Conjunctivae normal and EOM are normal. Pupils are equal, round, and reactive to light.  Neck: Normal range of motion. Neck supple.  Cardiovascular: Normal rate, regular rhythm, normal heart sounds and intact distal pulses.   Pulmonary/Chest: Effort normal and breath sounds normal.  Neurological: He is alert and oriented to person, place, and time.  Skin: Skin is warm and dry.          Assessment & Plan:  1. Hypokalemia: ddx includes spurious (eg fist clenching), decreased potassium excretion from renal failure, hyperaldosteronism, drugs that inhibit potassium excretion such as spironolactone, ACEi, and NSAIDs, or potassium shifts out of the cell from internal bleeding, metabolic acidosis or beta blockers -spironolactone was held before hospital discharge; he does not have an anion gap, he is not on beta-blockers, NSAIDS and no evidence of internal bleeding. --Stat EKG--> c/w prior, no peaked Twaves --check stat BMET--> K+5.1 wnl

## 2012-10-24 NOTE — Patient Instructions (Signed)
Re-check of your potassium was normal today. If you develop chest pain, racing heart, or increased shortness of breath please go to Urgent Care or the Emergency Room for further evaluation.

## 2012-11-05 ENCOUNTER — Other Ambulatory Visit: Payer: Medicare Other | Admitting: Lab

## 2012-11-09 ENCOUNTER — Other Ambulatory Visit: Payer: Medicare Other

## 2012-11-13 ENCOUNTER — Telehealth: Payer: Self-pay | Admitting: Cardiology

## 2012-11-13 NOTE — Telephone Encounter (Signed)
F/u  Returning call back to nurse.  

## 2012-11-13 NOTE — Telephone Encounter (Signed)
Patient's grand daughter [name] called no answer.LMTC.

## 2012-11-13 NOTE — Telephone Encounter (Signed)
New Problem:    Patient's granddaughter called in requesting a refill of the patient's Proair Inhaler.  Please call back.

## 2012-11-14 NOTE — Telephone Encounter (Signed)
Spoke to patient's grand daughter [name] need to call PCP for refill on pro air.

## 2012-11-19 ENCOUNTER — Ambulatory Visit (INDEPENDENT_AMBULATORY_CARE_PROVIDER_SITE_OTHER): Payer: Medicare Other | Admitting: *Deleted

## 2012-11-19 DIAGNOSIS — Z95810 Presence of automatic (implantable) cardiac defibrillator: Secondary | ICD-10-CM

## 2012-11-19 DIAGNOSIS — I255 Ischemic cardiomyopathy: Secondary | ICD-10-CM

## 2012-11-19 DIAGNOSIS — I5022 Chronic systolic (congestive) heart failure: Secondary | ICD-10-CM

## 2012-11-19 DIAGNOSIS — I2589 Other forms of chronic ischemic heart disease: Secondary | ICD-10-CM

## 2012-11-19 LAB — REMOTE ICD DEVICE
CHARGE TIME: 8.408 s
DEV-0020ICD: NEGATIVE
PACEART VT: 0
RV LEAD AMPLITUDE: 4.25 mv
TOT-0001: 1
TZAT-0002SLOWVT: NEGATIVE
TZAT-0012FASTVT: 200 ms
TZAT-0012SLOWVT: 200 ms
TZAT-0018FASTVT: NEGATIVE
TZAT-0018SLOWVT: NEGATIVE
TZAT-0019FASTVT: 8 V
TZAT-0019SLOWVT: 8 V
TZAT-0020FASTVT: 1.5 ms
TZAT-0020SLOWVT: 1.5 ms
TZON-0003SLOWVT: 360 ms
TZST-0001FASTVT: 2
TZST-0001FASTVT: 5
TZST-0001FASTVT: 6
TZST-0001SLOWVT: 5
TZST-0002FASTVT: NEGATIVE
TZST-0002FASTVT: NEGATIVE
TZST-0002FASTVT: NEGATIVE
TZST-0002SLOWVT: NEGATIVE
TZST-0002SLOWVT: NEGATIVE
TZST-0002SLOWVT: NEGATIVE
VENTRICULAR PACING ICD: 0.01 pct
VF: 0

## 2012-11-30 ENCOUNTER — Other Ambulatory Visit: Payer: Self-pay | Admitting: *Deleted

## 2012-11-30 ENCOUNTER — Telehealth: Payer: Self-pay | Admitting: *Deleted

## 2012-11-30 NOTE — Telephone Encounter (Signed)
Patient called and requested an appt for a phlebotomy. I have told the patient that I will have the desk RN call him. Left a voicemail message with the desk RN.  JMW

## 2012-12-03 ENCOUNTER — Other Ambulatory Visit: Payer: Medicare Other | Admitting: Lab

## 2012-12-03 ENCOUNTER — Encounter: Payer: Self-pay | Admitting: *Deleted

## 2012-12-10 ENCOUNTER — Encounter: Payer: Self-pay | Admitting: Internal Medicine

## 2012-12-21 ENCOUNTER — Other Ambulatory Visit: Payer: Self-pay | Admitting: Nurse Practitioner

## 2012-12-31 ENCOUNTER — Other Ambulatory Visit: Payer: Medicare Other | Admitting: Lab

## 2013-01-17 ENCOUNTER — Ambulatory Visit: Payer: Medicare Other | Admitting: Oncology

## 2013-02-04 ENCOUNTER — Other Ambulatory Visit: Payer: Medicare Other | Admitting: Lab

## 2013-02-18 ENCOUNTER — Other Ambulatory Visit: Payer: Self-pay

## 2013-02-18 ENCOUNTER — Ambulatory Visit (INDEPENDENT_AMBULATORY_CARE_PROVIDER_SITE_OTHER): Payer: Medicare Other | Admitting: *Deleted

## 2013-02-18 ENCOUNTER — Encounter: Payer: Self-pay | Admitting: Internal Medicine

## 2013-02-18 DIAGNOSIS — I2589 Other forms of chronic ischemic heart disease: Secondary | ICD-10-CM

## 2013-02-18 DIAGNOSIS — Z95810 Presence of automatic (implantable) cardiac defibrillator: Secondary | ICD-10-CM

## 2013-02-18 DIAGNOSIS — I255 Ischemic cardiomyopathy: Secondary | ICD-10-CM

## 2013-02-27 LAB — REMOTE ICD DEVICE
CHARGE TIME: 8.688 s
DEV-0020ICD: NEGATIVE
FVT: 0
RV LEAD IMPEDENCE ICD: 570 Ohm
RV LEAD THRESHOLD: 0.375 V
TOT-0001: 1
TOT-0002: 0
TZAT-0012FASTVT: 200 ms
TZAT-0012SLOWVT: 200 ms
TZAT-0018FASTVT: NEGATIVE
TZAT-0018SLOWVT: NEGATIVE
TZAT-0019FASTVT: 8 V
TZAT-0019SLOWVT: 8 V
TZAT-0020FASTVT: 1.5 ms
TZAT-0020SLOWVT: 1.5 ms
TZON-0003SLOWVT: 360 ms
TZON-0004SLOWVT: 16
TZST-0001FASTVT: 2
TZST-0001FASTVT: 6
TZST-0001SLOWVT: 5
TZST-0001SLOWVT: 6
TZST-0002FASTVT: NEGATIVE
TZST-0002FASTVT: NEGATIVE
TZST-0002FASTVT: NEGATIVE
TZST-0002SLOWVT: NEGATIVE
TZST-0002SLOWVT: NEGATIVE
VENTRICULAR PACING ICD: 0.01 pct
VF: 0

## 2013-03-04 ENCOUNTER — Other Ambulatory Visit: Payer: Medicare Other | Admitting: Lab

## 2013-03-22 ENCOUNTER — Encounter: Payer: Self-pay | Admitting: *Deleted

## 2013-05-13 ENCOUNTER — Encounter: Payer: Self-pay | Admitting: *Deleted

## 2013-05-14 ENCOUNTER — Encounter: Payer: Self-pay | Admitting: Internal Medicine

## 2013-05-14 ENCOUNTER — Emergency Department (HOSPITAL_COMMUNITY): Payer: Medicare Other

## 2013-05-14 ENCOUNTER — Ambulatory Visit (INDEPENDENT_AMBULATORY_CARE_PROVIDER_SITE_OTHER): Payer: Medicare Other | Admitting: Internal Medicine

## 2013-05-14 ENCOUNTER — Encounter (HOSPITAL_COMMUNITY): Payer: Self-pay

## 2013-05-14 ENCOUNTER — Emergency Department (HOSPITAL_COMMUNITY)
Admission: EM | Admit: 2013-05-14 | Discharge: 2013-05-15 | Disposition: A | Discharge status: Home / Self Care | Payer: Medicare Other | Source: Home / Self Care | Attending: Emergency Medicine | Admitting: Emergency Medicine

## 2013-05-14 VITALS — BP 128/68 | HR 63 | Wt 206.0 lb

## 2013-05-14 DIAGNOSIS — Z8679 Personal history of other diseases of the circulatory system: Secondary | ICD-10-CM | POA: Insufficient documentation

## 2013-05-14 DIAGNOSIS — I2589 Other forms of chronic ischemic heart disease: Secondary | ICD-10-CM | POA: Diagnosis present

## 2013-05-14 DIAGNOSIS — J441 Chronic obstructive pulmonary disease with (acute) exacerbation: Secondary | ICD-10-CM

## 2013-05-14 DIAGNOSIS — I5022 Chronic systolic (congestive) heart failure: Secondary | ICD-10-CM | POA: Insufficient documentation

## 2013-05-14 DIAGNOSIS — T380X5A Adverse effect of glucocorticoids and synthetic analogues, initial encounter: Secondary | ICD-10-CM | POA: Diagnosis present

## 2013-05-14 DIAGNOSIS — F17200 Nicotine dependence, unspecified, uncomplicated: Secondary | ICD-10-CM | POA: Insufficient documentation

## 2013-05-14 DIAGNOSIS — Z8709 Personal history of other diseases of the respiratory system: Secondary | ICD-10-CM | POA: Insufficient documentation

## 2013-05-14 DIAGNOSIS — Z86718 Personal history of other venous thrombosis and embolism: Secondary | ICD-10-CM | POA: Insufficient documentation

## 2013-05-14 DIAGNOSIS — I2510 Atherosclerotic heart disease of native coronary artery without angina pectoris: Secondary | ICD-10-CM | POA: Insufficient documentation

## 2013-05-14 DIAGNOSIS — D45 Polycythemia vera: Secondary | ICD-10-CM | POA: Diagnosis present

## 2013-05-14 DIAGNOSIS — Z8701 Personal history of pneumonia (recurrent): Secondary | ICD-10-CM | POA: Insufficient documentation

## 2013-05-14 DIAGNOSIS — I252 Old myocardial infarction: Secondary | ICD-10-CM | POA: Insufficient documentation

## 2013-05-14 DIAGNOSIS — Z8719 Personal history of other diseases of the digestive system: Secondary | ICD-10-CM | POA: Insufficient documentation

## 2013-05-14 DIAGNOSIS — D696 Thrombocytopenia, unspecified: Secondary | ICD-10-CM | POA: Diagnosis present

## 2013-05-14 DIAGNOSIS — N183 Chronic kidney disease, stage 3 unspecified: Secondary | ICD-10-CM | POA: Diagnosis present

## 2013-05-14 DIAGNOSIS — K219 Gastro-esophageal reflux disease without esophagitis: Secondary | ICD-10-CM | POA: Diagnosis present

## 2013-05-14 DIAGNOSIS — I255 Ischemic cardiomyopathy: Secondary | ICD-10-CM

## 2013-05-14 DIAGNOSIS — E875 Hyperkalemia: Secondary | ICD-10-CM | POA: Diagnosis present

## 2013-05-14 DIAGNOSIS — D72829 Elevated white blood cell count, unspecified: Secondary | ICD-10-CM | POA: Diagnosis present

## 2013-05-14 DIAGNOSIS — Z95810 Presence of automatic (implantable) cardiac defibrillator: Secondary | ICD-10-CM

## 2013-05-14 DIAGNOSIS — Z79899 Other long term (current) drug therapy: Secondary | ICD-10-CM | POA: Insufficient documentation

## 2013-05-14 DIAGNOSIS — R059 Cough, unspecified: Secondary | ICD-10-CM | POA: Insufficient documentation

## 2013-05-14 DIAGNOSIS — Z7982 Long term (current) use of aspirin: Secondary | ICD-10-CM | POA: Insufficient documentation

## 2013-05-14 DIAGNOSIS — I5023 Acute on chronic systolic (congestive) heart failure: Secondary | ICD-10-CM | POA: Diagnosis present

## 2013-05-14 DIAGNOSIS — R05 Cough: Secondary | ICD-10-CM | POA: Insufficient documentation

## 2013-05-14 DIAGNOSIS — R0682 Tachypnea, not elsewhere classified: Secondary | ICD-10-CM | POA: Insufficient documentation

## 2013-05-14 HISTORY — DX: Chronic obstructive pulmonary disease, unspecified: J44.9

## 2013-05-14 LAB — ICD DEVICE OBSERVATION
CHARGE TIME: 8.688 s
DEV-0020ICD: NEGATIVE
HV IMPEDENCE: 72 Ohm
PACEART VT: 0
RV LEAD AMPLITUDE: 6.5 mv
TOT-0002: 0
TOT-0006: 20130306000000
TZAT-0002SLOWVT: NEGATIVE
TZAT-0018SLOWVT: NEGATIVE
TZAT-0019FASTVT: 8 V
TZAT-0019SLOWVT: 8 V
TZAT-0020FASTVT: 1.5 ms
TZON-0003SLOWVT: 360 ms
TZON-0004SLOWVT: 32
TZON-0004VSLOWVT: 32
TZON-0005SLOWVT: 12
TZST-0001FASTVT: 2
TZST-0001FASTVT: 3
TZST-0001SLOWVT: 2
TZST-0001SLOWVT: 6
TZST-0002FASTVT: NEGATIVE
TZST-0002FASTVT: NEGATIVE
TZST-0002SLOWVT: NEGATIVE
TZST-0002SLOWVT: NEGATIVE
TZST-0002SLOWVT: NEGATIVE
VENTRICULAR PACING ICD: 0.01 pct

## 2013-05-14 LAB — CBC WITH DIFFERENTIAL/PLATELET
Basophils Absolute: 0 10*3/uL (ref 0.0–0.1)
Basophils Absolute: 0 10*3/uL (ref 0.0–0.1)
Eosinophils Absolute: 0.3 10*3/uL (ref 0.0–0.7)
Lymphocytes Relative: 17 % (ref 12–46)
Lymphs Abs: 1.8 10*3/uL (ref 0.7–4.0)
MCHC: 31.7 g/dL (ref 30.0–36.0)
MCV: 78.1 fl (ref 78.0–100.0)
Monocytes Absolute: 1 10*3/uL (ref 0.1–1.0)
Neutrophils Relative %: 70.1 % (ref 43.0–77.0)
Neutrophils Relative %: 67 % (ref 43–77)
Platelets: 148 10*3/uL — ABNORMAL LOW (ref 150.0–400.0)
Platelets: 150 10*3/uL (ref 150–400)
RBC: 6.11 MIL/uL — ABNORMAL HIGH (ref 4.22–5.81)
RDW: 18.1 % — ABNORMAL HIGH (ref 11.5–14.6)
WBC: 9.2 10*3/uL (ref 4.5–10.5)
WBC: 10.6 10*3/uL — ABNORMAL HIGH (ref 4.0–10.5)

## 2013-05-14 LAB — COMPREHENSIVE METABOLIC PANEL
ALT: 14 U/L (ref 0–53)
AST: 18 U/L (ref 0–37)
Alkaline Phosphatase: 86 U/L (ref 39–117)
CO2: 30 mEq/L (ref 19–32)
GFR calc Af Amer: 56 mL/min — ABNORMAL LOW (ref >90)
GFR calc non Af Amer: 48 mL/min — ABNORMAL LOW (ref >90)
Glucose, Bld: 89 mg/dL (ref 70–99)
Potassium: 5.3 mEq/L — ABNORMAL HIGH (ref 3.5–5.1)
Sodium: 141 mEq/L (ref 135–145)
Total Protein: 7.1 g/dL (ref 6.0–8.3)

## 2013-05-14 LAB — BASIC METABOLIC PANEL
BUN: 26 mg/dL — ABNORMAL HIGH (ref 6–23)
Chloride: 107 mEq/L (ref 96–112)
Creatinine, Ser: 1.4 mg/dL (ref 0.4–1.5)

## 2013-05-14 MED ORDER — IPRATROPIUM BROMIDE 0.02 % IN SOLN
0.5000 mg | Freq: Once | RESPIRATORY_TRACT | Status: AC
Start: 2013-05-14 — End: 2013-05-14
  Administered 2013-05-14: 0.5 mg via RESPIRATORY_TRACT
  Filled 2013-05-14: qty 2.5

## 2013-05-14 MED ORDER — ALBUTEROL SULFATE (5 MG/ML) 0.5% IN NEBU
5.0000 mg | INHALATION_SOLUTION | Freq: Once | RESPIRATORY_TRACT | Status: AC
Start: 2013-05-14 — End: 2013-05-14
  Administered 2013-05-14: 5 mg via RESPIRATORY_TRACT
  Filled 2013-05-14: qty 1

## 2013-05-14 NOTE — ED Notes (Signed)
[id number]<QI> Expected date:<BR> Expected time:<BR> Means of arrival:<BR> Comments:<BR> resp distress

## 2013-05-14 NOTE — Patient Instructions (Addendum)
Your physician recommends that you schedule a follow-up appointment in: September with Dr. [place]   You have carelink transmission scheduled for October 13,2014.    Your physician wants you to follow-up in:  12 months with Dr. Graciela Husbands. You will receive a reminder letter in the mail two months in advance. If you don't receive a letter, please call our office to schedule the follow-up appointment.  Your physician has recommended you make the following change in your medication:  Increase furosemide to 2 tablets daily for 5 days and then return to one tablet daily.  Please review your medicines when you get home and call us to let us know what you have been taking.   --[phone]

## 2013-05-14 NOTE — Assessment & Plan Note (Signed)
The patient's device was interrogated and the information was fully reviewed.  The device was not reprogrammed.  

## 2013-05-14 NOTE — Assessment & Plan Note (Signed)
He is volume overloaded. We will increase his Lasix from 20--40 mg or 5 days.  He is not clear of his medications. Review of Dr. Garth Bigness note  December 2013 demonstrated that he was on guideline directed medical therapy no record of which we today. Laboratory review at that time demonstrated hyperkalemia and microcytic anemia. We will recheck these labs today and then asked the patient to call us with his medication list  And connect him with Dr. Garth Bigness

## 2013-05-14 NOTE — Progress Notes (Signed)
Patient Care Team: Belia Heman, MD as PCP - General (Internal Medicine)   HPI  Jesus Sanders is a 77 y.o. male   is seen in followup for an ICD implanted spring 2013 for ischemic cardiac myopathy prior MI and congestive failure.  The patient denies chest pain, shortness of breath, nocturnal dyspnea, orthopnea or peripheral edema. There have been no palpitations, lightheadedness or syncope.  HE DOES HAVE SOME edema  He got lost to followup with Dr. Margaretha Seeds reset that for 2 months  He has a history of polycythemia. Last blood work in December demonstrated microcytic anemia  Past Medical History  Diagnosis Date  . Polycythemia vera(238.4)     a. [facility]  will restart his phlebotomies from about Mar 15 2011  . Stomach ulcer   . Tobacco abuse     a. cigars  . Duodenal perforation June 2012  . Peritonitis June 2012  . Pneumonia April 2012  . Systolic CHF, chronic     a. 12/2011 Echo: EF 25-30%, mid-dist antsept/inf, apical AK, Gr 1 DD, Triv AI, Mild MR.  . Ischemic cardiomyopathy     a. 01/2012 S/P MDT Protecta single lead ICD, ser # [id number]  . Hypertension   . History of DVT (deep vein thrombosis)   . Chronic bronchitis   . CAD (coronary artery disease)     a. 07/2011 Anterior apical STEMI/Cath/PCI: LM nl, LAD 100p/m (2.5 x 23mm Mini-Vision BMS), LCX 40p, RCA dominant, nl, EF 30%.  Marland Kitchen HTN (hypertension)   . GERD (gastroesophageal reflux disease)   . Myocardial infarction   . Shortness of breath     Past Surgical History  Procedure Laterality Date  . Cholecystectomy  03/2011    Dr. Carolynne Edouard  . Colon surgery    . Cardiac catheterization  Sept 2012    Normal left main, occluded LAD, 40% LCX and normal RCA. EF is 30%  . US echocardiography  Sept 2012    EF 25 to 30% with akinesis of the mid to distal anterior and apical myocardium, trivial AI and no apical thrombus  . Cardiac defibrillator placement     single chamber  . Shoulder arthroscopy      left, rotatotor cuff tendinopathy    Current Outpatient Prescriptions  Medication Sig Dispense Refill  . aspirin EC 81 MG tablet Take 81 mg by mouth daily.      Marland Kitchen atorvastatin (LIPITOR) 80 MG tablet TAKE 1 TABLET AT BEDTIME  30 tablet  6  . azithromycin (ZITHROMAX Z-PAK) 250 MG tablet Please take 500mg  for the first day and then 250mg  for the remainder 4 days.  6 each  0  . Chlorpheniramine Maleate (ALLERGY RELIEF PO) Take 1 tablet by mouth daily.       . furosemide (LASIX) 20 MG tablet Take 1 tablet (20 mg total) by mouth daily. Use as needed for swelling or for weight gain of 2 to 3 pounds overnight.  30 tablet  11  . loratadine (CLARITIN) 10 MG tablet Take 10 mg by mouth daily.      . pantoprazole (PROTONIX) 40 MG tablet Take 40 mg by mouth daily.        . predniSONE (DELTASONE) 20 MG tablet Please take 40mg  (2 tablets) daily 10/14/12 and 10/15/12 and then take 20mg  (1 tablet) daily for the next three days from 10/16/12 to 10/18/12.  10 tablet  0  . albuterol (PROVENTIL HFA;VENTOLIN HFA) 108 (  90 BASE) MCG/ACT inhaler Inhale 2 puffs into the lungs every 4 (four) hours as needed for wheezing or shortness of breath.  1 Inhaler  4  . fluticasone (FLONASE) 50 MCG/ACT nasal spray Place 2 sprays into the nose daily. 2 sprays into each nostril daily.  16 g  2   No current facility-administered medications for this visit.    No Known Allergies  Review of Systems negative except from HPI and PMH  Physical Exam BP 128/68  Pulse 63  Wt 206 lb (93.441 kg)  BMI 27.93 kg/m2 Well developed and well nourished in no acute distress HENT normal E scleral and icterus clear Neck Supple JVP flat; carotids brisk and full Clear to ausculation  Regular rate and rhythm, no murmurs gallops or rub Soft with active bowel sounds No clubbing cyanosis 1+ Edema Alert and oriented, grossly normal motor and sensory function Skin Warm and Dry  Electrocardiogram dated  today demonstrates sinus rhythm with a rate of 63 Interval 17/10/41 leAD Placement error His prior anterolateral infarct  Assessment and  Plan

## 2013-05-14 NOTE — ED Notes (Signed)
Pt presents by EMS with c/o shortness of breath. Pt was seen 2 days ago for same and given one neb tx at his house and not transported to hospital. Per EMS pt started experiencing shortness of breath about 2 hours ago and was not able to catch his breath. EMS gave 10 albuterol, .5 atrovent, 125 solumedrol en route by EMS. EMS reports lung sounds were decreased on their arrival with slight wheezing in right upper quadrant. Hx of COPD, asthma.

## 2013-05-14 NOTE — Assessment & Plan Note (Signed)
No chest pain

## 2013-05-14 NOTE — Assessment & Plan Note (Signed)
As above.

## 2013-05-14 NOTE — Assessment & Plan Note (Signed)
aa As above

## 2013-05-15 ENCOUNTER — Inpatient Hospital Stay (HOSPITAL_COMMUNITY)
Admission: EM | Admit: 2013-05-15 | Discharge: 2013-05-18 | DRG: 190 | Disposition: A | Discharge status: Home / Self Care | Payer: Medicare Other | Attending: Internal Medicine | Admitting: Internal Medicine

## 2013-05-15 ENCOUNTER — Encounter (HOSPITAL_COMMUNITY): Payer: Self-pay | Admitting: *Deleted

## 2013-05-15 DIAGNOSIS — J441 Chronic obstructive pulmonary disease with (acute) exacerbation: Secondary | ICD-10-CM

## 2013-05-15 DIAGNOSIS — K219 Gastro-esophageal reflux disease without esophagitis: Secondary | ICD-10-CM

## 2013-05-15 DIAGNOSIS — Z9109 Other allergy status, other than to drugs and biological substances: Secondary | ICD-10-CM

## 2013-05-15 DIAGNOSIS — E785 Hyperlipidemia, unspecified: Secondary | ICD-10-CM

## 2013-05-15 DIAGNOSIS — I5022 Chronic systolic (congestive) heart failure: Secondary | ICD-10-CM | POA: Diagnosis present

## 2013-05-15 DIAGNOSIS — Z95810 Presence of automatic (implantable) cardiac defibrillator: Secondary | ICD-10-CM

## 2013-05-15 DIAGNOSIS — R58 Hemorrhage, not elsewhere classified: Secondary | ICD-10-CM

## 2013-05-15 DIAGNOSIS — E875 Hyperkalemia: Secondary | ICD-10-CM

## 2013-05-15 DIAGNOSIS — K631 Perforation of intestine (nontraumatic): Secondary | ICD-10-CM

## 2013-05-15 DIAGNOSIS — J9620 Acute and chronic respiratory failure, unspecified whether with hypoxia or hypercapnia: Secondary | ICD-10-CM

## 2013-05-15 DIAGNOSIS — I255 Ischemic cardiomyopathy: Secondary | ICD-10-CM

## 2013-05-15 DIAGNOSIS — R0602 Shortness of breath: Secondary | ICD-10-CM

## 2013-05-15 DIAGNOSIS — R238 Other skin changes: Secondary | ICD-10-CM

## 2013-05-15 DIAGNOSIS — N179 Acute kidney failure, unspecified: Secondary | ICD-10-CM

## 2013-05-15 DIAGNOSIS — D45 Polycythemia vera: Secondary | ICD-10-CM

## 2013-05-15 DIAGNOSIS — K253 Acute gastric ulcer without hemorrhage or perforation: Secondary | ICD-10-CM

## 2013-05-15 DIAGNOSIS — I5023 Acute on chronic systolic (congestive) heart failure: Secondary | ICD-10-CM

## 2013-05-15 DIAGNOSIS — I2510 Atherosclerotic heart disease of native coronary artery without angina pectoris: Secondary | ICD-10-CM | POA: Diagnosis present

## 2013-05-15 DIAGNOSIS — I10 Essential (primary) hypertension: Secondary | ICD-10-CM

## 2013-05-15 LAB — CBC WITH DIFFERENTIAL/PLATELET
Eosinophils Relative: 0 % (ref 0–5)
Lymphocytes Relative: 8 % — ABNORMAL LOW (ref 12–46)
Lymphs Abs: 0.7 10*3/uL (ref 0.7–4.0)
MCV: 80 fL (ref 78.0–100.0)
Neutrophils Relative %: 92 % — ABNORMAL HIGH (ref 43–77)
Platelets: 144 10*3/uL — ABNORMAL LOW (ref 150–400)
RBC: 5.94 MIL/uL — ABNORMAL HIGH (ref 4.22–5.81)
WBC: 8.5 10*3/uL (ref 4.0–10.5)

## 2013-05-15 LAB — COMPREHENSIVE METABOLIC PANEL
Albumin: 3.4 g/dL — ABNORMAL LOW (ref 3.5–5.2)
BUN: 34 mg/dL — ABNORMAL HIGH (ref 6–23)
Calcium: 9.8 mg/dL (ref 8.4–10.5)
Chloride: 103 mEq/L (ref 96–112)
Creatinine, Ser: 1.43 mg/dL — ABNORMAL HIGH (ref 0.50–1.35)
Total Bilirubin: 0.2 mg/dL — ABNORMAL LOW (ref 0.3–1.2)

## 2013-05-15 LAB — PRO B NATRIURETIC PEPTIDE: Pro B Natriuretic peptide (BNP): 805.5 pg/mL — ABNORMAL HIGH (ref 0–125)

## 2013-05-15 MED ORDER — ENOXAPARIN SODIUM 40 MG/0.4ML ~~LOC~~ SOLN
40.0000 mg | SUBCUTANEOUS | Status: DC
  Administered 2013-05-15 – 2013-05-18 (×4): 40 mg via SUBCUTANEOUS
  Filled 2013-05-15 (×4): qty 0.4

## 2013-05-15 MED ORDER — ALBUTEROL SULFATE (5 MG/ML) 0.5% IN NEBU
2.5000 mg | INHALATION_SOLUTION | RESPIRATORY_TRACT | Status: DC | PRN
  Administered 2013-05-16: 2.5 mg via RESPIRATORY_TRACT
  Filled 2013-05-15 (×2): qty 0.5

## 2013-05-15 MED ORDER — BUDESONIDE 0.25 MG/2ML IN SUSP
0.2500 mg | Freq: Two times a day (BID) | RESPIRATORY_TRACT | Status: DC
  Administered 2013-05-15 – 2013-05-17 (×6): 0.25 mg via RESPIRATORY_TRACT
  Filled 2013-05-15 (×9): qty 2

## 2013-05-15 MED ORDER — ONDANSETRON HCL 4 MG PO TABS: 4.0000 mg | ORAL_TABLET | Freq: Four times a day (QID) | ORAL | Status: DC | PRN

## 2013-05-15 MED ORDER — PREDNISONE 20 MG PO TABS: 40.0000 mg | ORAL_TABLET | Freq: Every day | ORAL | Status: DC

## 2013-05-15 MED ORDER — FUROSEMIDE 10 MG/ML IJ SOLN
40.0000 mg | Freq: Every day | INTRAMUSCULAR | Status: DC
  Administered 2013-05-15 – 2013-05-16 (×2): 40 mg via INTRAVENOUS
  Filled 2013-05-15 (×3): qty 4

## 2013-05-15 MED ORDER — ONDANSETRON HCL 4 MG/2ML IJ SOLN: 4.0000 mg | Freq: Four times a day (QID) | INTRAMUSCULAR | Status: DC | PRN

## 2013-05-15 MED ORDER — DOXYCYCLINE HYCLATE 100 MG PO TABS
100.0000 mg | ORAL_TABLET | Freq: Two times a day (BID) | ORAL | Status: DC
  Administered 2013-05-15 – 2013-05-18 (×7): 100 mg via ORAL
  Filled 2013-05-15 (×8): qty 1

## 2013-05-15 MED ORDER — ASPIRIN EC 81 MG PO TBEC
81.0000 mg | DELAYED_RELEASE_TABLET | Freq: Every day | ORAL | Status: DC
  Administered 2013-05-15 – 2013-05-18 (×4): 81 mg via ORAL
  Filled 2013-05-15 (×4): qty 1

## 2013-05-15 MED ORDER — SODIUM CHLORIDE 0.9 % IJ SOLN
3.0000 mL | Freq: Two times a day (BID) | INTRAMUSCULAR | Status: DC
  Administered 2013-05-16 – 2013-05-18 (×4): 3 mL via INTRAVENOUS

## 2013-05-15 MED ORDER — PANTOPRAZOLE SODIUM 40 MG PO TBEC
40.0000 mg | DELAYED_RELEASE_TABLET | Freq: Every day | ORAL | Status: DC
  Administered 2013-05-15 – 2013-05-18 (×4): 40 mg via ORAL
  Filled 2013-05-15 (×4): qty 1

## 2013-05-15 MED ORDER — ACETAMINOPHEN 325 MG PO TABS: 650.0000 mg | ORAL_TABLET | Freq: Four times a day (QID) | ORAL | Status: DC | PRN

## 2013-05-15 MED ORDER — LISINOPRIL 20 MG PO TABS
20.0000 mg | ORAL_TABLET | Freq: Every day | ORAL | Status: DC
  Administered 2013-05-16: 20 mg via ORAL
  Filled 2013-05-15 (×2): qty 1

## 2013-05-15 MED ORDER — CARVEDILOL 25 MG PO TABS
25.0000 mg | ORAL_TABLET | Freq: Two times a day (BID) | ORAL | Status: DC
  Administered 2013-05-15 – 2013-05-18 (×7): 25 mg via ORAL
  Filled 2013-05-15 (×9): qty 1

## 2013-05-15 MED ORDER — SIMVASTATIN 10 MG PO TABS
10.0000 mg | ORAL_TABLET | Freq: Every day | ORAL | Status: DC
  Administered 2013-05-15 – 2013-05-17 (×3): 10 mg via ORAL
  Filled 2013-05-15 (×4): qty 1

## 2013-05-15 MED ORDER — ASPIRIN EC 325 MG PO TBEC: 325.0000 mg | DELAYED_RELEASE_TABLET | Freq: Every day | ORAL | Status: DC

## 2013-05-15 MED ORDER — ACETAMINOPHEN 650 MG RE SUPP: 650.0000 mg | Freq: Four times a day (QID) | RECTAL | Status: DC | PRN

## 2013-05-15 MED ORDER — SODIUM CHLORIDE 0.9 % IJ SOLN
3.0000 mL | Freq: Two times a day (BID) | INTRAMUSCULAR | Status: DC
  Administered 2013-05-15 – 2013-05-17 (×4): 3 mL via INTRAVENOUS

## 2013-05-15 MED ORDER — LISINOPRIL 20 MG PO TABS
20.0000 mg | ORAL_TABLET | Freq: Two times a day (BID) | ORAL | Status: DC
  Administered 2013-05-15: 20 mg via ORAL
  Filled 2013-05-15 (×2): qty 1

## 2013-05-15 NOTE — Progress Notes (Signed)
[facility] PROGRESS NOTE  DAMIR LEUNG Jesus Sanders:[id number] DOB: 17-Jul-1936 DOA: 05/15/2013 PCP: Stacy Gardner, MD/Dr. Windle [facility] at [facility] Primary cardiologist: Dr. Sherryl Manges  Brief narrative 77 y.o. male with known history of CAD status post stenting, ischemic cardiomyopathy status post ICD placement, COPD tobacco abuse has been experiencing increasing shortness of breath over the last 2-3 days. Patient in addition also has some productive cough. 2 days ago did call EMS and was given nebulizer at the time and patient felt better and was not brought to the ER. Last night patient came to the ER and was given nebulizer and steroid treatment discharge home and patient return back 3 hours because of shortness of breath. In the ER patient was found to be again wheezing and was placed on nebulizer. In addition patient also has noticed increasing swelling in the lower extremities and had gone to Dr. Clide Cliff, patient's cardiologist for routine followup and his Lasix dose was increased to 40 mg from 20 daily. At this time patient has been admitted for further management. Patient denies any chest pain fever chills nausea vomiting abdominal pain.  Assessment/Plan: 1. Dyspnea: Possibly from mild exacerbation of chronic systolic CHF and COPD from acute viral bronchitis. Clinically improving. Continue Pulmicort, antibiotics and bronchodilator therapy. Continue IV Lasix.  2. Mild acute on chronic systolic CHF: Management as above. 3. COPD: Improving. Management as above  4. Mild hyperkalemia: Resolved. Reduce lisinopril which may have to be discontinued if he has persistent hyperkalemia. 5. Stage III chronic kidney disease: Baseline creatinine: 1.5. Creatinine at baseline. Follow BMP. 6. CAD/ischemic cardiomyopathy/ICD: Asymptomatic 7. Mild thrombocytopenia: Chronic intermittent. Stable.  Code Status: Full  Family Communication: None  Disposition Plan: Home when medically  stable.   Consultants:  None  Procedures:  None  Antibiotics:  Oral doxycycline: 05/15/13 >   HPI/Subjective:  Dyspnea is better but not yet at baseline. No chest pain. Cough with intermittent white tenacious sputum.   Objective: Filed Vitals:   05/15/13 0806 05/15/13 0807 05/15/13 0810 05/15/13 1052  BP:    128/72  Pulse:      Temp:      TempSrc:      Resp:      Height:      Weight:      SpO2: 85% 93% 96%     Intake/Output Summary (Last 24 hours) at 05/15/13 1435 Last data filed at 05/15/13 1300  Gross per 24 hour  Intake    600 ml  Output      0 ml  Net    600 ml   Filed Weights   05/15/13 0513  Weight: 90.719 kg (200 lb)    Exam:   General exam: Comfortable. Ambulating by bedside.  Respiratory system: Reduced breath sounds bilaterally with occasional bilateral rhonchi and basal crackle. No increased work of breathing.  Cardiovascular system: S1 & S2 heard, RRR. No JVD, murmurs, gallops, clicks. 1+ bilateral leg edema. Telemetry: Sinus rhythm-sinus tachycardia in the 100s.  Gastrointestinal system: Abdomen is nondistended, soft and nontender. Normal bowel sounds heard.  Central nervous system: Alert and oriented. No focal neurological deficits.  Extremities: Symmetric 5 x 5 power.   Data Reviewed: Basic Metabolic Panel:  Recent Labs Lab 05/14/13 1141 05/14/13 2220 05/15/13 0526  NA 143 141 140  K 5.3* 5.3* 4.9  CL 107 104 103  CO2 32 30 27  GLUCOSE 54* 89 184*  BUN 26* 30* 34*  CREATININE 1.4 1.40* 1.43*  CALCIUM 9.5 9.6 9.8  Liver Function Tests:  Recent Labs Lab 05/14/13 2220 05/15/13 0526  AST 18 14  ALT 14 14  ALKPHOS 86 81  BILITOT 0.2* 0.2*  PROT 7.1 6.9  ALBUMIN 3.6 3.4*   No results found for this basename: LIPASE, AMYLASE,  in the last 168 hours No results found for this basename: AMMONIA,  in the last 168 hours CBC:  Recent Labs Lab 05/14/13 1141 05/14/13 2220 05/15/13 0526  WBC 9.2 10.6* 8.5  NEUTROABS 6.4  7.1 7.8*  HGB 14.2 14.9 14.7  HCT 44.8 49.0 47.5  MCV 78.1 80.2 80.0  PLT 148.0* 150 144*   Cardiac Enzymes:  Recent Labs Lab 05/15/13 0526  TROPONINI <0.30   BNP (last 3 results)  Recent Labs  10/09/12 0925 10/12/12 0932 05/15/13 0526  PROBNP 125.0* 939.9* 805.5*   CBG: No results found for this basename: GLUCAP,  in the last 168 hours  No results found for this or any previous visit (from the past 240 hour(s)).   Studies: Dg Chest 2 View  05/14/2013   *RADIOLOGY REPORT*  Clinical Data: Shortness of breath and cough.  History of COPD and smoker.  CHEST - 2 VIEW  Comparison: 10/12/2012  Findings: Stable appearance of cardiac pacemaker.  Emphysematous changes in the lungs with coarse fibrosis throughout the lungs. These changes appear stable since previous study.  There is blunting of costophrenic angles which was present previously but appears to be increased now.  Increasing pleural thickening or pleural effusion may be present.  No focal consolidation.  No pneumothorax.  Degenerative changes in the spine.  IMPRESSION: Emphysematous changes and chronic fibrosis in the lungs. Increasing right pleural effusion versus pleural thickening.  No focal consolidation.   Original Report Authenticated By: Burman Nieves, M.D.     Additional labs:   Scheduled Meds: Continuous Infusions:  Principal Problem:   SOB (shortness of breath) Active Problems:   CAD (coronary artery disease)   Chronic systolic heart failure   COPD exacerbation, likely    Time spent: 25 minutes.    [facility]  [facility] Pager [phone].   If 8PM-8AM, please contact night-coverage at [email or web address], password [facility] 05/15/2013, 2:35 PM  LOS: 0 days

## 2013-05-15 NOTE — ED Notes (Signed)
Per EMS pt was seen here last night, discharged about 1 am w/ COPD exacerbation, about 3 am started feeling short of breath again, exp wheezing in all fields, pt given duoneb and another 5mg  albuterol by EMS, BP 119/68, HR 86, 98% RA, RR 22.

## 2013-05-15 NOTE — H&P (Signed)
[facility] History and Physical  Jesus Sanders VWU:[id number] DOB: [birth year] DOA: 05/15/2013  Referring physician: ER physician. PCP: Jesus Gardner, MD Jesus Sanders at [facility]. Specialists: Jesus Sanders. Cardiologist.  Chief Complaint: Shortness of breath.  HPI: Jesus Sanders is a 77 y.o. male with known history of CAD status post stenting, ischemic cardiomyopathy status post ICD placement, COPD tobacco abuse has been experiencing increasing shortness of breath over the last 2-3 days. Patient in addition also has some productive cough. 2 days ago did call EMS and was given nebulizer at the time and patient felt better and was not brought to the ER. Last night patient came to the ER and was given nebulizer and steroid treatment discharge home and patient return back 3 hours because of shortness of breath. In the ER patient was found to be again wheezing and was placed on nebulizer. In addition patient also has noticed increasing swelling in the lower extremities and had gone to Jesus Sanders, patient's cardiologist for routine followup and his Lasix dose was increased to 40 mg from 20 daily. At this time patient has been admitted for further management. Patient denies any chest pain fever chills nausea vomiting abdominal pain.  Review of Systems: As presented in the history of presenting illness, rest negative.  Past Medical History  Diagnosis Date  . Polycythemia vera(238.4)     a. [facility]  will restart his phlebotomies from about Mar 15 2011  . Stomach ulcer   . Tobacco abuse     a. cigars  . Duodenal perforation June 2012  . Peritonitis June 2012  . Pneumonia April 2012  . Systolic CHF, chronic     a. 12/2011 Echo: EF 25-30%, mid-dist antsept/inf, apical AK, Gr 1 DD, Triv AI, Mild MR.  . Ischemic cardiomyopathy     a. 01/2012 S/P MDT Protecta single lead ICD, ser # [id number] H  . History of  DVT (deep vein thrombosis)   . Chronic bronchitis   . CAD (coronary artery disease)     a. 07/2011 Anterior apical STEMI/Cath/PCI: LM nl, LAD 100p/m (2.5 x 23mm Mini-Vision BMS), LCX 40p, RCA dominant, nl, EF 30%.  Marland Kitchen GERD (gastroesophageal reflux disease)   . Myocardial infarction   . Shortness of breath   . Asthma   . COPD (chronic obstructive pulmonary disease)    Past Surgical History  Procedure Laterality Date  . Cholecystectomy  03/2011    Dr. Carolynne Edouard  . Colon surgery    . Cardiac catheterization  Sept 2012    Normal left main, occluded LAD, 40% LCX and normal RCA. EF is 30%  . US echocardiography  Sept 2012    EF 25 to 30% with akinesis of the mid to distal anterior and apical myocardium, trivial AI and no apical thrombus  . Cardiac defibrillator placement      single chamber  . Shoulder arthroscopy      left, rotatotor cuff tendinopathy   Social History:  reports that he has been smoking Cigars.  His smokeless tobacco use includes Chew. He reports that  drinks alcohol. He reports that he does not use illicit drugs.  home. where does patient live-- can do ADLs. Can patient participate in ADLs?  No Known Allergies  Family History  Problem Relation Age of Onset  . Lung disease Father       Prior to Admission medications   Medication Sig Start Date End Date  Taking? Authorizing Provider  albuterol (PROVENTIL HFA;VENTOLIN HFA) 108 (90 BASE) MCG/ACT inhaler Inhale 2 puffs into the lungs every 6 (six) hours as needed for wheezing.   Yes Historical Provider, MD  aspirin EC 81 MG tablet Take 81 mg by mouth daily.   Yes Historical Provider, MD  carvedilol (COREG) 25 MG tablet Take 25 mg by mouth 2 (two) times daily with a meal.   Yes Historical Provider, MD  furosemide (LASIX) 40 MG tablet Take 20 mg by mouth at bedtime.   Yes Historical Provider, MD  lisinopril (PRINIVIL,ZESTRIL) 20 MG tablet Take 20 mg by mouth 2 (two) times daily.   Yes Historical Provider, MD  lovastatin  (MEVACOR) 20 MG tablet Take 20 mg by mouth at bedtime.   Yes Historical Provider, MD  omeprazole (PRILOSEC) 20 MG capsule Take 20 mg by mouth daily.   Yes Historical Provider, MD  predniSONE (DELTASONE) 20 MG tablet Take 2 tablets (40 mg total) by mouth daily. 05/15/13  Yes Jesus Sprout, MD   Physical Exam: Filed Vitals:   05/15/13 0513 05/15/13 0514  BP: 124/58   Pulse: 94   Temp: 97.6 F (36.4 C)   TempSrc: Oral   Resp: 24   Height: 6' (1.829 m)   Weight: 90.719 kg (200 lb)   SpO2: 90% 94%     General:  Well-developed and nourished.  Eyes:  Anicteric no pallor.  ENT:  No discharge from ears eyes nose mouth.  Neck:  No mass felt.  Cardiovascular:  S1-S2 heard.  Respiratory:  Presently has no rhonchi or crepitations.  Abdomen:  Soft nontender bowel sounds present.  Skin:  No rash.  Musculoskeletal:  No edema.  Psychiatric:  Appears normal.  Neurologic:  Alert awake oriented to time place and person. Moves all extremities.  Labs on Admission:  Basic Metabolic Panel:  Recent Labs Lab 05/14/13 1141 05/14/13 2220  NA 143 141  K 5.3* 5.3*  CL 107 104  CO2 32 30  GLUCOSE 54* 89  BUN 26* 30*  CREATININE 1.4 1.40*  CALCIUM 9.5 9.6   Liver Function Tests:  Recent Labs Lab 05/14/13 2220  AST 18  ALT 14  ALKPHOS 86  BILITOT 0.2*  PROT 7.1  ALBUMIN 3.6   No results found for this basename: LIPASE, AMYLASE,  in the last 168 hours No results found for this basename: AMMONIA,  in the last 168 hours CBC:  Recent Labs Lab 05/14/13 1141 05/14/13 2220  WBC 9.2 10.6*  NEUTROABS 6.4 7.1  HGB 14.2 14.9  HCT 44.8 49.0  MCV 78.1 80.2  PLT 148.0* 150   Cardiac Enzymes: No results found for this basename: CKTOTAL, CKMB, CKMBINDEX, TROPONINI,  in the last 168 hours  BNP (last 3 results)  Recent Labs  10/09/12 0925 10/12/12 0932  PROBNP 125.0* 939.9*   CBG: No results found for this basename: GLUCAP,  in the last 168 hours  Radiological Exams on  Admission: Dg Chest 2 View  05/14/2013   *RADIOLOGY REPORT*  Clinical Data: Shortness of breath and cough.  History of COPD and smoker.  CHEST - 2 VIEW  Comparison: 10/12/2012  Findings: Stable appearance of cardiac pacemaker.  Emphysematous changes in the lungs with coarse fibrosis throughout the lungs. These changes appear stable since previous study.  There is blunting of costophrenic angles which was present previously but appears to be increased now.  Increasing pleural thickening or pleural effusion may be present.  No focal consolidation.  No pneumothorax.  Degenerative  changes in the spine.  IMPRESSION: Emphysematous changes and chronic fibrosis in the lungs. Increasing right pleural effusion versus pleural thickening.  No focal consolidation.   Original Report Authenticated By: Burman Nieves, M.D.    EKG: Independently reviewed.  Normal sinus rhythm with ST-T changes comparable to the old EKG.  Assessment/Plan Principal Problem:   SOB (shortness of breath) Active Problems:   CAD (coronary artery disease)   Chronic systolic heart failure   COPD exacerbation, likely   1. Shortness of breath most likely secondary COPD and CHF - check BNP levels. Continue with nebulizer and Pulmicort steroids and antibiotic. Patient has been placed on Lasix 40 mg IV daily. Closely follow intake output and daily weights and metabolic panel. 2. Mild hyperkalemia with chronic kidney disease - closely follow potassium if there is any further worsening will need to hold lisinopril. Patient is also placed on Lasix for possible CHF exacerbation. There is any further worsening of his creatinine may have to hold Lasix also. 3. CAD - denies any chest pain. Check troponin. Continue aspirin. 4. History of polycythemia - presently hemoglobin is around 14.    Code Status:  Full code.  Family Communication:  Patient's daughter at the bedside.  Disposition Plan:  Admit to inpatient.    Armany Mano N. [facility] Pager [phone].  If 7PM-7AM, please contact night-coverage [email or web address] Password [facility] 05/15/2013, 6:16 AM

## 2013-05-15 NOTE — ED Notes (Signed)
[id number]<AV> Expected date:<BR> Expected time:<BR> Means of arrival:<BR> Comments:<BR> EMS COPD exac

## 2013-05-15 NOTE — ED Notes (Signed)
Pt is back stating he feels like he can't get his breath, states I just have something stuck in my throat and it won't come up, pt states has had a productive cough. Pt has a hx of COPD, on 2L [place] at 93% at this time.

## 2013-05-15 NOTE — ED Provider Notes (Signed)
Patient returned 3 hours after discharge with return of acute shortness of breath. When EMS arrived at the house he was wheezing diffusely in all fields and was given a neb and brought here. Now the wheezing is improved and patient feels better again. Satting 90% on room air. Patient has pursed lip breathing and slightly to. Patient just left the hospital 3 hours ago with normal labs and x-ray. Will admit for COPD exacerbation.  Gwyneth Sprout, MD 05/15/13 [phone]

## 2013-05-15 NOTE — Progress Notes (Signed)
UR COMPLETED  

## 2013-05-15 NOTE — ED Notes (Signed)
Pt ambulated on room air. Started at 95% and went down to 90% at lowest.

## 2013-05-15 NOTE — ED Provider Notes (Addendum)
History    CSN: [id number] Arrival date & time 05/14/13  2201  First MD Initiated Contact with Patient 05/14/13 2301     Chief Complaint  Patient presents with  . Shortness of Breath   (Consider location/radiation/quality/duration/timing/severity/associated sxs/prior Treatment) Patient is a 77 y.o. male presenting with shortness of breath. The history is provided by the patient.  Shortness of Breath Severity:  Moderate Onset quality:  Sudden Duration:  2 hours Timing:  Intermittent Progression:  Worsening Chronicity:  Recurrent Context: activity   Relieved by:  Nothing Worsened by:  Activity Ineffective treatments:  Inhaler Associated symptoms: cough, sputum production and wheezing   Associated symptoms: no abdominal pain, no chest pain, no fever and no vomiting   Risk factors comment:  Hx of COPD and still smoking at this time  Past Medical History  Diagnosis Date  . Polycythemia vera(238.4)     a. [facility]  will restart his phlebotomies from about Mar 15 2011  . Stomach ulcer   . Tobacco abuse     a. cigars  . Duodenal perforation June 2012  . Peritonitis June 2012  . Pneumonia April 2012  . Systolic CHF, chronic     a. 12/2011 Echo: EF 25-30%, mid-dist antsept/inf, apical AK, Gr 1 DD, Triv AI, Mild MR.  . Ischemic cardiomyopathy     a. 01/2012 S/P MDT Protecta single lead ICD, ser # [id number]  . History of DVT (deep vein thrombosis)   . Chronic bronchitis   . CAD (coronary artery disease)     a. 07/2011 Anterior apical STEMI/Cath/PCI: LM nl, LAD 100p/m (2.5 x 23mm Mini-Vision BMS), LCX 40p, RCA dominant, nl, EF 30%.  Marland Kitchen GERD (gastroesophageal reflux disease)   . Myocardial infarction   . Shortness of breath   . Asthma   . COPD (chronic obstructive pulmonary disease)    Past Surgical History  Procedure Laterality Date  . Cholecystectomy  03/2011    Dr. Carolynne Edouard  . Colon surgery    . Cardiac  catheterization  Sept 2012    Normal left main, occluded LAD, 40% LCX and normal RCA. EF is 30%  . US echocardiography  Sept 2012    EF 25 to 30% with akinesis of the mid to distal anterior and apical myocardium, trivial AI and no apical thrombus  . Cardiac defibrillator placement      single chamber  . Shoulder arthroscopy      left, rotatotor cuff tendinopathy   Family History  Problem Relation Age of Onset  . Lung disease Father    History  Substance Use Topics  . Smoking status: Current Every Day Smoker -- 0.50 packs/day for 60 years    Types: Cigars    Last Attempt to Quit: 03/04/2011  . Smokeless tobacco: Current User    Types: Chew     Comment: Smoked .5-1ppd of cigarettes from age 37 to a few yrs ago and has been smoking 10 small cigars daily since then.  . Alcohol Use: Yes     Comment: rarely     Review of Systems  Constitutional: Negative for fever.  Respiratory: Positive for cough, sputum production, shortness of breath and wheezing.   Cardiovascular: Negative for chest pain.  Gastrointestinal: Negative for vomiting and abdominal pain.  All other systems reviewed and are negative.    Allergies  Review of patient's allergies indicates no known allergies.  Home Medications   Current Outpatient Rx  Name  Route  Sig  Dispense  Refill  . albuterol (PROVENTIL HFA;VENTOLIN HFA) 108 (90 BASE) MCG/ACT inhaler   Inhalation   Inhale 2 puffs into the lungs every 6 (six) hours as needed for wheezing.         Marland Kitchen aspirin EC 81 MG tablet   Oral   Take 81 mg by mouth daily.         . carvedilol (COREG) 25 MG tablet   Oral   Take 25 mg by mouth 2 (two) times daily with a meal.         . furosemide (LASIX) 40 MG tablet   Oral   Take 20 mg by mouth at bedtime.         Marland Kitchen lisinopril (PRINIVIL,ZESTRIL) 20 MG tablet   Oral   Take 20 mg by mouth 2 (two) times daily.         Marland Kitchen lovastatin (MEVACOR) 20 MG tablet   Oral   Take 20 mg by mouth at bedtime.          Marland Kitchen omeprazole (PRILOSEC) 20 MG capsule   Oral   Take 20 mg by mouth daily.          BP 130/74  Pulse 75  Resp 22  SpO2 88% Physical Exam  Nursing note and vitals reviewed. Constitutional: He is oriented to person, place, and time. He appears well-developed and well-nourished. No distress.  HENT:  Head: Normocephalic and atraumatic.  Mouth/Throat: Oropharynx is clear and moist.  Eyes: Conjunctivae and EOM are normal. Pupils are equal, round, and reactive to light.  Neck: Normal range of motion. Neck supple.  Cardiovascular: Normal rate, regular rhythm and intact distal pulses.   No murmur heard. Pulmonary/Chest: Tachypnea noted. No respiratory distress. He has decreased breath sounds. He has wheezes. He has no rales.  Abdominal: Soft. He exhibits no distension. There is no tenderness. There is no rebound and no guarding.  Musculoskeletal: Normal range of motion. He exhibits no edema and no tenderness.  Neurological: He is alert and oriented to person, place, and time.  Skin: Skin is warm and dry. No rash noted. No erythema.  Psychiatric: He has a normal mood and affect. His behavior is normal.    ED Course  Procedures (including critical care time) Labs Reviewed  CBC WITH DIFFERENTIAL - Abnormal; Notable for the following:    WBC 10.6 (*)    RBC 6.11 (*)    MCH 24.4 (*)    RDW 17.0 (*)    Monocytes Absolute 1.3 (*)    All other components within normal limits  COMPREHENSIVE METABOLIC PANEL - Abnormal; Notable for the following:    Potassium 5.3 (*)    BUN 30 (*)    Creatinine, Ser 1.40 (*)    Total Bilirubin 0.2 (*)    GFR calc non Af Amer 48 (*)    GFR calc Af Amer 56 (*)    All other components within normal limits   Dg Chest 2 View  05/14/2013   *RADIOLOGY REPORT*  Clinical Data: Shortness of breath and cough.  History of COPD and smoker.  CHEST - 2 VIEW  Comparison: 10/12/2012  Findings: Stable appearance of cardiac pacemaker.  Emphysematous changes in the lungs  with coarse fibrosis throughout the lungs. These changes appear stable since previous study.  There is blunting of costophrenic angles which was present previously but appears to be increased now.  Increasing pleural thickening or pleural effusion may be present.  No focal consolidation.  No pneumothorax.  Degenerative changes in the spine.  IMPRESSION: Emphysematous changes and chronic fibrosis in the lungs. Increasing right pleural effusion versus pleural thickening.  No focal consolidation.   Original Report Authenticated By: Burman Nieves, M.D.    Date: 05/15/2013  Rate: 69  Rhythm: normal sinus rhythm  QRS Axis: normal  Intervals: normal  ST/T Wave abnormalities: Pronounced T wave inversion in the anterior lateral leads. No ST elevation or depression  Conduction Disutrbances:none  Narrative Interpretation:   Old EKG Reviewed: unchanged   1. COPD exacerbation     MDM   Pt with COPD with exacerbation and initially sating in the 80's.  Patient is still smoking but states inhaler at home was not working. He was given albuterol, Atrovent and Solu-Medrol the way here and states he's breathing better now than he has all day. Patient's satting between 89 and 92% at rest and when ambulated is satting 95%. He denies any shortness of breath now feels much better. Labs and x-ray are unrevealing. Feel that patient can go home on steroids and return if symptoms worsen.  12:34 AM When I went back to speak with pt he is sating 92%  Gwyneth Sprout, MD 05/15/13 1610  Gwyneth Sprout, MD 05/15/13 9604

## 2013-05-16 DIAGNOSIS — E875 Hyperkalemia: Secondary | ICD-10-CM

## 2013-05-16 DIAGNOSIS — I5023 Acute on chronic systolic (congestive) heart failure: Secondary | ICD-10-CM

## 2013-05-16 DIAGNOSIS — I509 Heart failure, unspecified: Secondary | ICD-10-CM

## 2013-05-16 LAB — BASIC METABOLIC PANEL
BUN: 43 mg/dL — ABNORMAL HIGH (ref 6–23)
Chloride: 101 mEq/L (ref 96–112)
GFR calc Af Amer: 50 mL/min — ABNORMAL LOW (ref >90)
GFR calc non Af Amer: 43 mL/min — ABNORMAL LOW (ref >90)
Potassium: 4.7 mEq/L (ref 3.5–5.1)
Sodium: 138 mEq/L (ref 135–145)

## 2013-05-16 LAB — CBC
HCT: 46.8 % (ref 39.0–52.0)
Hemoglobin: 14.1 g/dL (ref 13.0–17.0)
MCHC: 30.1 g/dL (ref 30.0–36.0)
RBC: 5.89 MIL/uL — ABNORMAL HIGH (ref 4.22–5.81)
WBC: 13 10*3/uL — ABNORMAL HIGH (ref 4.0–10.5)

## 2013-05-16 MED ORDER — METHYLPREDNISOLONE SODIUM SUCC 125 MG IJ SOLR
125.0000 mg | Freq: Once | INTRAMUSCULAR | Status: AC
  Administered 2013-05-16: 125 mg via INTRAVENOUS
  Filled 2013-05-16: qty 2

## 2013-05-16 MED ORDER — METHYLPREDNISOLONE SODIUM SUCC 125 MG IJ SOLR
60.0000 mg | Freq: Four times a day (QID) | INTRAMUSCULAR | Status: DC
  Administered 2013-05-16 – 2013-05-17 (×3): 60 mg via INTRAVENOUS
  Filled 2013-05-16 (×7): qty 0.96

## 2013-05-16 MED ORDER — IPRATROPIUM BROMIDE 0.02 % IN SOLN
0.5000 mg | Freq: Four times a day (QID) | RESPIRATORY_TRACT | Status: DC
  Administered 2013-05-16 – 2013-05-18 (×7): 0.5 mg via RESPIRATORY_TRACT
  Filled 2013-05-16 (×7): qty 2.5

## 2013-05-16 MED ORDER — ALBUTEROL SULFATE (5 MG/ML) 0.5% IN NEBU
2.5000 mg | INHALATION_SOLUTION | Freq: Four times a day (QID) | RESPIRATORY_TRACT | Status: DC
  Administered 2013-05-16 – 2013-05-18 (×7): 2.5 mg via RESPIRATORY_TRACT
  Filled 2013-05-16 (×6): qty 0.5

## 2013-05-16 NOTE — Progress Notes (Signed)
This AM patient's sats were above 92% while ambulating at a moderate pace on room air but decreased to 87-88% near the end of the walk. Patient stated that he felt fine, although he admitted to being slightly short of breath. Patient placed back on 2L O2 nasal cannula when arrived back to room. Will continue to monitor. Jesus Sanders

## 2013-05-16 NOTE — Progress Notes (Signed)
SATURATION QUALIFICATIONS: (This note is used to comply with regulatory documentation for home oxygen)  Patient Saturations on Room Air at Rest = 92%  Patient Saturations on Room Air while Ambulating = 87%  Patient Saturations on 2 Liters of oxygen while Ambulating = 96%  Please briefly explain why patient needs home oxygen: 

## 2013-05-16 NOTE — Progress Notes (Signed)
[facility] PROGRESS NOTE  Jesus Sanders:[id number] DOB: September 09, 1936 DOA: 05/15/2013 PCP: Stacy Gardner, MD/Dr. Windle Guard at [facility] Primary cardiologist: Dr. Sherryl Manges  Brief narrative 77 y.o. male with known history of CAD status post stenting, ischemic cardiomyopathy status post ICD placement, COPD tobacco abuse has been experiencing increasing shortness of breath over the last 2-3 days. Patient in addition also has some productive cough. 2 days ago did call EMS and was given nebulizer at the time and patient felt better and was not brought to the ER. Last night patient came to the ER and was given nebulizer and steroid treatment discharge home and patient return back 3 hours because of shortness of breath. In the ER patient was found to be again wheezing and was placed on nebulizer. In addition patient also has noticed increasing swelling in the lower extremities and had gone to Dr. Clide Cliff, patient's cardiologist for routine followup and his Lasix dose was increased to 40 mg from 20 daily. At this time patient has been admitted for further management. Patient denies any chest pain fever chills nausea vomiting abdominal pain.  Assessment/Plan: 1. Dyspnea: Possibly from COPD exacerbation >CHF exacerbation complicating underlying COPD and pulmonary fibrosis. Continue Pulmicort, antibiotics and bronchodilator therapy. Continue IV Lasix. Patient wheezing this morning-started IV Solu-Medrol. Monitor. Followup chest x-ray in a.m. 2. Mild acute on chronic systolic CHF/ischemic CM: Continue IV Lasix and consider changing to by mouth in a.m.. 3. COPD exacerbation: Patient seemed worse this morning with worsening dyspnea and wheezing. Gives history of ongoing tobacco abuse-smokes cigars and chews tobacco. Cessation counseled. Started IV Solu-Medrol. Monitor.  4. Mild hyperkalemia: Resolved. Reduce lisinopril which may have to be discontinued if he has persistent  hyperkalemia. 5. Stage III chronic kidney disease: Baseline creatinine: 1.5. Creatinine at baseline. Follow BMP. 6. CAD/ischemic cardiomyopathy/ICD: Asymptomatic 7. Mild thrombocytopenia: Chronic intermittent. Stable. 8. Tobacco abuse: Cessation counseled  Code Status: Full  Family Communication: None  Disposition Plan: Home when medically stable.   Consultants:  None  Procedures:  None  Antibiotics:  Oral doxycycline: 05/15/13 >   HPI/Subjective: Dyspnea worse this morning. Also wheezing. Ongoing tobacco abuse at home. Chewing tobacco in the hospital.  Objective: Filed Vitals:   05/15/13 2050 05/16/13 0447 05/16/13 0833 05/16/13 1433  BP: 129/71 139/81  120/62  Pulse: 76 65  70  Temp: 98.5 F (36.9 C) 98.2 F (36.8 C)  97.8 F (36.6 C)  TempSrc: Oral Oral  Oral  Resp: 18 18  18   Height:      Weight:  92.2 kg (203 lb 4.2 oz)    SpO2: 96% 96% 94% 95%    Intake/Output Summary (Last 24 hours) at 05/16/13 1709 Last data filed at 05/16/13 1300  Gross per 24 hour  Intake    720 ml  Output   1175 ml  Net   -455 ml   Filed Weights   05/15/13 0513 05/16/13 0447  Weight: 90.719 kg (200 lb) 92.2 kg (203 lb 4.2 oz)    Exam:   General exam: Mild increased work of breathing, this morning  Respiratory system: Reduced breath sounds bilaterally with bilateral expiratory wheezing. Mild increased work of breathing.  Cardiovascular system: S1 & S2 heard, RRR. No JVD, murmurs, gallops, clicks. 1+ bilateral leg edema-decreased. Telemetry: Mostly sinus rhythm. Occasional sinus bradycardia in the 50s.  Gastrointestinal system: Abdomen is nondistended, soft and nontender. Normal bowel sounds heard.  Central nervous system: Alert and oriented. No focal neurological deficits.  Extremities:  Symmetric 5 x 5 power.   Data Reviewed: Basic Metabolic Panel:  Recent Labs Lab 05/14/13 1141 05/14/13 2220 05/15/13 0526 05/16/13 0440  NA 143 141 140 138  K 5.3* 5.3* 4.9 4.7   CL 107 104 103 101  CO2 32 30 27 34*  GLUCOSE 54* 89 184* 98  BUN 26* 30* 34* 43*  CREATININE 1.4 1.40* 1.43* 1.54*  CALCIUM 9.5 9.6 9.8 9.9   Liver Function Tests:  Recent Labs Lab 05/14/13 2220 05/15/13 0526  AST 18 14  ALT 14 14  ALKPHOS 86 81  BILITOT 0.2* 0.2*  PROT 7.1 6.9  ALBUMIN 3.6 3.4*   No results found for this basename: LIPASE, AMYLASE,  in the last 168 hours No results found for this basename: AMMONIA,  in the last 168 hours CBC:  Recent Labs Lab 05/14/13 1141 05/14/13 2220 05/15/13 0526 05/16/13 0440  WBC 9.2 10.6* 8.5 13.0*  NEUTROABS 6.4 7.1 7.8*  --   HGB 14.2 14.9 14.7 14.1  HCT 44.8 49.0 47.5 46.8  MCV 78.1 80.2 80.0 79.5  PLT 148.0* 150 144* 148*   Cardiac Enzymes:  Recent Labs Lab 05/15/13 0526  TROPONINI <0.30   BNP (last 3 results)  Recent Labs  10/09/12 0925 10/12/12 0932 05/15/13 0526  PROBNP 125.0* 939.9* 805.5*   CBG: No results found for this basename: GLUCAP,  in the last 168 hours  No results found for this or any previous visit (from the past 240 hour(s)).   Studies: Dg Chest 2 View  05/14/2013   *RADIOLOGY REPORT*  Clinical Data: Shortness of breath and cough.  History of COPD and smoker.  CHEST - 2 VIEW  Comparison: 10/12/2012  Findings: Stable appearance of cardiac pacemaker.  Emphysematous changes in the lungs with coarse fibrosis throughout the lungs. These changes appear stable since previous study.  There is blunting of costophrenic angles which was present previously but appears to be increased now.  Increasing pleural thickening or pleural effusion may be present.  No focal consolidation.  No pneumothorax.  Degenerative changes in the spine.  IMPRESSION: Emphysematous changes and chronic fibrosis in the lungs. Increasing right pleural effusion versus pleural thickening.  No focal consolidation.   Original Report Authenticated By: Burman Nieves, M.D.     Additional labs:   Scheduled Meds: Continuous  Infusions:  Principal Problem:   SOB (shortness of breath) Active Problems:   CAD (coronary artery disease)   Chronic systolic heart failure   COPD exacerbation, likely    Time spent: 35 minutes.    [facility]  [facility] Pager [phone].   If 8PM-8AM, please contact night-coverage at [email or web address], password [facility] 05/16/2013, 5:09 PM  LOS: 1 day

## 2013-05-16 NOTE — Progress Notes (Signed)
Patient seen putting chewing tobacco in mouth.  Patient educated about smoking and chewing cessation and given handouts.  Patient also informed about [place] being a tobacco free campus.  Patient handed over chewing tobacco and charge nurse placed chewing tobacco in locked cabinet.  Dr. Waymon Amato told about patient chewing the tobacco.  Will continue to monitor patient.

## 2013-05-17 ENCOUNTER — Inpatient Hospital Stay (HOSPITAL_COMMUNITY): Payer: Medicare Other

## 2013-05-17 LAB — BASIC METABOLIC PANEL
BUN: 45 mg/dL — ABNORMAL HIGH (ref 6–23)
CO2: 29 mEq/L (ref 19–32)
Calcium: 10 mg/dL (ref 8.4–10.5)
Chloride: 98 mEq/L (ref 96–112)
Creatinine, Ser: 1.35 mg/dL (ref 0.50–1.35)
GFR calc Af Amer: 58 mL/min — ABNORMAL LOW (ref >90)
GFR calc non Af Amer: 50 mL/min — ABNORMAL LOW (ref >90)
Glucose, Bld: 131 mg/dL — ABNORMAL HIGH (ref 70–99)
Potassium: 5 mEq/L (ref 3.5–5.1)
Sodium: 135 mEq/L (ref 135–145)

## 2013-05-17 LAB — CBC
HCT: 48.1 % (ref 39.0–52.0)
Hemoglobin: 14.9 g/dL (ref 13.0–17.0)
MCH: 24.1 pg — ABNORMAL LOW (ref 26.0–34.0)
MCHC: 31 g/dL (ref 30.0–36.0)
MCV: 78 fL (ref 78.0–100.0)
Platelets: 155 10*3/uL (ref 150–400)
RBC: 6.17 MIL/uL — ABNORMAL HIGH (ref 4.22–5.81)
RDW: 16.6 % — ABNORMAL HIGH (ref 11.5–15.5)
WBC: 18.3 10*3/uL — ABNORMAL HIGH (ref 4.0–10.5)

## 2013-05-17 MED ORDER — METHYLPREDNISOLONE SODIUM SUCC 125 MG IJ SOLR
60.0000 mg | Freq: Two times a day (BID) | INTRAMUSCULAR | Status: DC
  Administered 2013-05-17 – 2013-05-18 (×2): 60 mg via INTRAVENOUS
  Filled 2013-05-17 (×4): qty 0.96

## 2013-05-17 MED ORDER — LISINOPRIL 10 MG PO TABS
10.0000 mg | ORAL_TABLET | Freq: Every day | ORAL | Status: DC
  Administered 2013-05-17 – 2013-05-18 (×2): 10 mg via ORAL
  Filled 2013-05-17 (×2): qty 1

## 2013-05-17 MED ORDER — FUROSEMIDE 40 MG PO TABS
40.0000 mg | ORAL_TABLET | Freq: Every day | ORAL | Status: DC
  Administered 2013-05-17 – 2013-05-18 (×2): 40 mg via ORAL
  Filled 2013-05-17 (×2): qty 1

## 2013-05-17 NOTE — Progress Notes (Signed)
Pt BP 95/54 HR 66.  Asymptomatic.  MD notified.  Will continue to monitor.

## 2013-05-17 NOTE — Progress Notes (Signed)
[facility] PROGRESS NOTE  Jesus Sanders VHQ:[id number] DOB: 26-Jan-1936 DOA: 05/15/2013 PCP: Stacy Gardner, MD/Dr. Windle Guard at [facility] Primary cardiologist: Dr. Sherryl Manges  Brief narrative 77 y.o. male with known history of CAD status post stenting, ischemic cardiomyopathy status post ICD placement, COPD tobacco abuse has been experiencing increasing shortness of breath over the last 2-3 days. Patient in addition also has some productive cough. 2 days ago did call EMS and was given nebulizer at the time and patient felt better and was not brought to the ER. Last night patient came to the ER and was given nebulizer and steroid treatment discharge home and patient return back 3 hours because of shortness of breath. In the ER patient was found to be again wheezing and was placed on nebulizer. In addition patient also has noticed increasing swelling in the lower extremities and had gone to Dr. Clide Cliff, patient's cardiologist for routine followup and his Lasix dose was increased to 40 mg from 20 daily. At this time patient has been admitted for further management. Patient denies any chest pain fever chills nausea vomiting abdominal pain.  Assessment/Plan: 1. Dyspnea: Possibly from COPD exacerbation >CHF exacerbation complicating underlying COPD and pulmonary fibrosis. Continue Pulmicort, antibiotics and bronchodilator therapy. Improving. Chest x-ray: Stable 2. Mild acute on chronic systolic CHF/ischemic CM: Clinically seems almost euvolemic. Change Lasix to by mouth. 3. COPD exacerbation: Patient seemed worse on 7/10 with worsening dyspnea and wheezing. Gives history of ongoing tobacco abuse-smokes cigars and chews tobacco. Cessation counseled. Started IV Solu-Medrol. Improving-reduce Solu-Medrol  4. Mild hyperkalemia: Resolved. Reduce lisinopril which may have to be discontinued if he has persistent hyperkalemia. Since potassium was gradually creeping up, discussed with  Dr. [place], primary cardiologist and reduced lisinopril from 20 mg daily to 10 mg daily. Follow BMP in a.m. 5. Stage III chronic kidney disease: Baseline creatinine: 1.5. Creatinine at baseline. Follow BMP. 6. CAD/ischemic cardiomyopathy/ICD: Asymptomatic 7. Mild thrombocytopenia: Chronic intermittent. Resolved. 8. Tobacco abuse: Cessation counseled 9. Leukocytosis: Likely secondary to steroids.  Code Status: Full  Family Communication: None  Disposition Plan: Home when medically stable.   Consultants:  None  Procedures:  None  Antibiotics:  Oral doxycycline: 05/15/13 >   HPI/Subjective: Feels a whole lot better. Dyspnea improved.  Objective: Filed Vitals:   05/17/13 0733 05/17/13 1028 05/17/13 1358 05/17/13 1401  BP:  100/64 85/58 95/54   Pulse:   68 66  Temp:   98 F (36.7 C)   TempSrc:   Oral   Resp:   20   Height:      Weight:      SpO2: 87% 93% 93%     Intake/Output Summary (Last 24 hours) at 05/17/13 1729 Last data filed at 05/17/13 1655  Gross per 24 hour  Intake   1760 ml  Output   2950 ml  Net  -1190 ml   Filed Weights   05/15/13 0513 05/16/13 0447 05/17/13 0500  Weight: 90.719 kg (200 lb) 92.2 kg (203 lb 4.2 oz) 93.1 kg (205 lb 4 oz)    Exam:   General exam: Much improved and appears comfortable.  Respiratory system: Improved breath sounds. No wheezing or rhonchi. No crackles. No increased work of breathing.   Cardiovascular system: S1 & S2 heard, RRR. No JVD, murmurs, gallops, clicks. No pedal edema. Telemetry: Mostly sinus rhythm.  Gastrointestinal system: Abdomen is nondistended, soft and nontender. Normal bowel sounds heard.  Central nervous system: Alert and oriented. No focal neurological deficits.  Extremities:  Symmetric 5 x 5 power.   Data Reviewed: Basic Metabolic Panel:  Recent Labs Lab 05/14/13 1141 05/14/13 2220 05/15/13 0526 05/16/13 0440 05/17/13 0445  NA 143 141 140 138 135  K 5.3* 5.3* 4.9 4.7 5.0  CL 107 104 103  101 98  CO2 32 30 27 34* 29  GLUCOSE 54* 89 184* 98 131*  BUN 26* 30* 34* 43* 45*  CREATININE 1.4 1.40* 1.43* 1.54* 1.35  CALCIUM 9.5 9.6 9.8 9.9 10.0   Liver Function Tests:  Recent Labs Lab 05/14/13 2220 05/15/13 0526  AST 18 14  ALT 14 14  ALKPHOS 86 81  BILITOT 0.2* 0.2*  PROT 7.1 6.9  ALBUMIN 3.6 3.4*   No results found for this basename: LIPASE, AMYLASE,  in the last 168 hours No results found for this basename: AMMONIA,  in the last 168 hours CBC:  Recent Labs Lab 05/14/13 1141 05/14/13 2220 05/15/13 0526 05/16/13 0440 05/17/13 0445  WBC 9.2 10.6* 8.5 13.0* 18.3*  NEUTROABS 6.4 7.1 7.8*  --   --   HGB 14.2 14.9 14.7 14.1 14.9  HCT 44.8 49.0 47.5 46.8 48.1  MCV 78.1 80.2 80.0 79.5 78.0  PLT 148.0* 150 144* 148* 155   Cardiac Enzymes:  Recent Labs Lab 05/15/13 0526  TROPONINI <0.30   BNP (last 3 results)  Recent Labs  10/09/12 0925 10/12/12 0932 05/15/13 0526  PROBNP 125.0* 939.9* 805.5*   CBG: No results found for this basename: GLUCAP,  in the last 168 hours  No results found for this or any previous visit (from the past 240 hour(s)).   Studies: Dg Chest 2 View  05/17/2013   *RADIOLOGY REPORT*  Clinical Data: COPD exacerbation.  CHEST - 2 VIEW  Comparison: 05/14/2013  Findings: Lungs are hyperexpanded, consistent with emphysema. Aeration at the right base is improved.  There is chronic underlying interstitial disease. Cardiopericardial silhouette is at upper limits of normal for size.  Left-sided pacer / AICD remains in place. Telemetry leads overlie the chest.  Probable tiny residual right effusion.  IMPRESSION: Emphysema with improving aeration at the right base and persistent tiny right effusion.   Original Report Authenticated By: Kennith Center, M.D.     Additional labs:   Scheduled Meds: Continuous Infusions:  Principal Problem:   SOB (shortness of breath) Active Problems:   CAD (coronary artery disease)   Chronic systolic heart  failure   COPD exacerbation, likely   Acute on chronic systolic CHF (congestive heart failure)    Time spent: 35 minutes.    [facility]  [facility] Pager [phone].   If 8PM-8AM, please contact night-coverage at [email or web address], password [facility] 05/17/2013, 5:29 PM  LOS: 2 days

## 2013-05-18 DIAGNOSIS — J9620 Acute and chronic respiratory failure, unspecified whether with hypoxia or hypercapnia: Secondary | ICD-10-CM

## 2013-05-18 LAB — BASIC METABOLIC PANEL
BUN: 50 mg/dL — ABNORMAL HIGH (ref 6–23)
CO2: 27 mEq/L (ref 19–32)
Calcium: 9.6 mg/dL (ref 8.4–10.5)
Glucose, Bld: 117 mg/dL — ABNORMAL HIGH (ref 70–99)
Potassium: 4.8 mEq/L (ref 3.5–5.1)
Sodium: 135 mEq/L (ref 135–145)

## 2013-05-18 MED ORDER — DOXYCYCLINE HYCLATE 100 MG PO TABS: 100.0000 mg | ORAL_TABLET | Freq: Two times a day (BID) | ORAL | Status: DC

## 2013-05-18 MED ORDER — IPRATROPIUM-ALBUTEROL 0.5-2.5 (3) MG/3ML IN SOLN: 3.0000 mL | Freq: Four times a day (QID) | RESPIRATORY_TRACT | Status: DC | PRN

## 2013-05-18 MED ORDER — LISINOPRIL 10 MG PO TABS: 10.0000 mg | ORAL_TABLET | Freq: Every day | ORAL | Status: DC

## 2013-05-18 MED ORDER — PREDNISONE 10 MG PO TABS: ORAL_TABLET | ORAL | Status: DC

## 2013-05-18 MED ORDER — FUROSEMIDE 40 MG PO TABS: 40.0000 mg | ORAL_TABLET | Freq: Every day | ORAL | Status: DC

## 2013-05-18 MED ORDER — ALBUTEROL SULFATE HFA 108 (90 BASE) MCG/ACT IN AERS: 2.0000 | INHALATION_SPRAY | Freq: Four times a day (QID) | RESPIRATORY_TRACT | Status: DC | PRN

## 2013-05-18 NOTE — Progress Notes (Signed)
SATURATION QUALIFICATIONS: (This note is used to comply with regulatory documentation for home oxygen)  Patient Saturations on Room Air at Rest = 91%  Patient Saturations on Room Air while Ambulating = 85%  Patient Saturations on 2L Liters of oxygen while Ambulating = 93%

## 2013-05-18 NOTE — Care Management Note (Signed)
Cm informed by MD patient will require home oxygen therapy and a nebulizer machine upon discharge. DME rep Jermaine with [facility] notified of DME referral. DME delivery scheduled to room prior to discharge. PCP: Stacy Gardner, MD Dr. Windle Guard at [facility]. No other needs identified.   Leonie Green [phone]

## 2013-05-18 NOTE — Progress Notes (Signed)
SATURATION QUALIFICATIONS: (This note is used to comply with regulatory documentation for home oxygen)  Patient Saturations on Room Air at Rest = 94% Patient Saturations on Room Air while Ambulating =  88% Patient Saturations on 2L Liters of oxygen while Ambulating = 95%

## 2013-05-18 NOTE — Discharge Summary (Signed)
Physician Discharge Summary  HEZEKIAH VELTRE WUJ:[id number] DOB: 1936/04/30 DOA: 05/15/2013  PCP: Kaleen Mask, MD Primary Cardiologist: Dr. Peter Swaziland EPS Cardiologist: Dr. Sherryl Manges  Admit date: 05/15/2013 Discharge date: 05/18/2013  Time spent: Greater than 30 minutes  Recommendations for Outpatient Follow-up:  1. Dr. Peter Swaziland, Cardiologist in 5 days with repeat labs (CBC & BMP) 2. Dr. Windle Guard, PCP in 1 week. 3. Arranged for home nebulizer 4. Home oxygen via nasal cannula at 2 L per minute continuously  Discharge Diagnoses:  Principal Problem:   SOB (shortness of breath) Active Problems:   CAD (coronary artery disease)   Chronic systolic heart failure   COPD exacerbation, likely   Acute on chronic systolic CHF (congestive heart failure)   Discharge Condition: Improved & Stable  Diet recommendation: Heart healthy diet  Filed Weights   05/16/13 0447 05/17/13 0500 05/18/13 0506  Weight: 92.2 kg (203 lb 4.2 oz) 93.1 kg (205 lb 4 oz) 93.3 kg (205 lb 11 oz)    History of present illness:  77 y.o. male with known history of CAD status post stenting, ischemic cardiomyopathy status post ICD placement, COPD tobacco abuse has been experiencing increasing shortness of breath over the last 2-3 days. Patient in addition also has some productive cough. 2 days ago did call EMS and was given nebulizer at the time and patient felt better and was not brought to the ER. Last night patient came to the ER and was given nebulizer and steroid treatment discharge home and patient return back 3 hours because of shortness of breath. In the ER patient was found to be again wheezing and was placed on nebulizer. In addition patient also has noticed increasing swelling in the lower extremities and had gone to Dr. Clide Cliff, patient's cardiologist for routine followup and his Lasix dose was increased to 40 mg from 20 daily. At this time patient has been admitted for further management. Patient  denies any chest pain fever chills nausea vomiting abdominal pain.  Hospital Course:  1. Dyspnea/Acute on possible Chronic hypoxic respiratory failure: Possibly from COPD exacerbation >CHF exacerbation complicating underlying COPD and pulmonary fibrosis. Treated for both COPD & CHF exacerbation as below. Chest x-ray: Improved. 2. Mild acute on chronic systolic CHF/ischemic CM: Patient was treated with IV Lasix for a couple of days. Clinically seems almost euvolemic. Changed Lasix to by mouth at 40 mg daily.  3. COPD exacerbation: Patient seemed worse on 7/10 with worsening dyspnea and wheezing. Gives history of ongoing tobacco abuse-smokes cigars and chews tobacco. Cessation counseled. Started IV Solu-Medrol. Also treated with Abx, BD's & O2. Improved. Will be DCed on steroid taper. Home Nebulizer arranged. 4. Mild hyperkalemia: Resolved. Reduce lisinopril which may have to be discontinued if he has persistent hyperkalemia. Since potassium was gradually creeping up, discussed with Dr. Swaziland, primary cardiologist and reduced lisinopril from 20 mg daily to 10 mg daily. Follow BMP closely as OP. 5. Stage III chronic kidney disease: Baseline creatinine: 1.5. Creatinine at baseline. Follow BMP.  6. CAD/ischemic cardiomyopathy/ICD: Asymptomatic  7. Mild thrombocytopenia: Chronic intermittent. Resolved.  8. Tobacco abuse: Cessation counseled  9. Leukocytosis: Likely secondary to steroids.   Procedures:  None   Consultations:  None  Discharge Exam:  Complaints: Denies complaints. Denies dyspnea, cough or chest pain. Anxious to go home.  Filed Vitals:   05/17/13 2119 05/18/13 0506 05/18/13 0833 05/18/13 1039  BP: 111/89 121/76  128/78  Pulse: 65 61  70  Temp: 98.7 F (37.1 C) 97.5 F (  36.4 C)    TempSrc: Oral Oral    Resp: 19 19    Height:      Weight:  93.3 kg (205 lb 11 oz)    SpO2: 98% 90% 96% 94%     General exam: comfortable.   Respiratory system: Clear to auscultation. No  increased work of breathing.   Cardiovascular system: S1 & S2 heard, RRR. No JVD, murmurs, gallops, clicks. No pedal edema. Telemetry: sinus rhythm.   Gastrointestinal system: Abdomen is nondistended, soft and nontender. Normal bowel sounds heard.   Central nervous system: Alert and oriented. No focal neurological deficits.   Extremities: Symmetric 5 x 5 power.   Discharge Instructions      Discharge Orders   Future Appointments Provider Department Dept Phone   08/01/2013 9:15 AM Peter M Swaziland, MD [facility] Main Office [place]) [phone]   08/19/2013 9:00 AM [facility] [employer] Main Office [place]) [phone]   Future Orders Complete By Expires     (HEART FAILURE PATIENTS) Call MD:  Anytime you have any of the following symptoms: 1) 3 pound weight gain in 24 hours or 5 pounds in 1 week 2) shortness of breath, with or without a dry hacking cough 3) swelling in the hands, feet or stomach 4) if you have to sleep on extra pillows at night in order to breathe.  As directed     Call MD for:  difficulty breathing, headache or visual disturbances  As directed     Diet - low sodium heart healthy  As directed     Increase activity slowly  As directed         Medication List         albuterol 108 (90 BASE) MCG/ACT inhaler  Commonly known as:  PROVENTIL HFA;VENTOLIN HFA  Inhale 2 puffs into the lungs every 6 (six) hours as needed for wheezing or shortness of breath.     aspirin EC 81 MG tablet  Take 81 mg by mouth daily.     carvedilol 25 MG tablet  Commonly known as:  COREG  Take 25 mg by mouth 2 (two) times daily with a meal.     doxycycline 100 MG tablet  Commonly known as:  VIBRA-TABS  Take 1 tablet (100 mg total) by mouth every 12 (twelve) hours.     furosemide 40 MG tablet  Commonly known as:  LASIX  Take 1 tablet (40 mg total) by mouth daily.     ipratropium-albuterol 0.5-2.5 (3) MG/3ML Soln  Commonly known as:  DUONEB  Take 3 mLs by  nebulization every 6 (six) hours as needed (Wheezing or shortness of breath.).     lisinopril 10 MG tablet  Commonly known as:  PRINIVIL,ZESTRIL  Take 1 tablet (10 mg total) by mouth daily.     lovastatin 20 MG tablet  Commonly known as:  MEVACOR  Take 20 mg by mouth at bedtime.     omeprazole 20 MG capsule  Commonly known as:  PRILOSEC  Take 20 mg by mouth daily.     predniSONE 10 MG tablet  Commonly known as:  DELTASONE  Take 4 tablets daily x2 days, then 3 tablets daily x2 days, then 2 tablets daily x2 days, then 1 tablet daily x2 days, then stop.       Follow-up Information   Follow up with Peter Swaziland, MD. Schedule an appointment as soon as possible for a visit in 5 days. (To be seen with repeat labs (CBC &  BMP))    Contact information:   [address] [place] [address] [phone]       Follow up with Kaleen Mask, MD. Schedule an appointment as soon as possible for a visit in 1 week.   Contact information:   [address] [place] [address] [phone]        The results of significant diagnostics from this hospitalization (including imaging, microbiology, ancillary and laboratory) are listed below for reference.    Significant Diagnostic Studies: Dg Chest 2 View  05/17/2013   *RADIOLOGY REPORT*  Clinical Data: COPD exacerbation.  CHEST - 2 VIEW  Comparison: 05/14/2013  Findings: Lungs are hyperexpanded, consistent with emphysema. Aeration at the right base is improved.  There is chronic underlying interstitial disease. Cardiopericardial silhouette is at upper limits of normal for size.  Left-sided pacer / AICD remains in place. Telemetry leads overlie the chest.  Probable tiny residual right effusion.  IMPRESSION: Emphysema with improving aeration at the right base and persistent tiny right effusion.   Original Report Authenticated By: Kennith Center, M.D.   Dg Chest 2 View  05/14/2013   *RADIOLOGY REPORT*  Clinical Data:  Shortness of breath and cough.  History of COPD and smoker.  CHEST - 2 VIEW  Comparison: 10/12/2012  Findings: Stable appearance of cardiac pacemaker.  Emphysematous changes in the lungs with coarse fibrosis throughout the lungs. These changes appear stable since previous study.  There is blunting of costophrenic angles which was present previously but appears to be increased now.  Increasing pleural thickening or pleural effusion may be present.  No focal consolidation.  No pneumothorax.  Degenerative changes in the spine.  IMPRESSION: Emphysematous changes and chronic fibrosis in the lungs. Increasing right pleural effusion versus pleural thickening.  No focal consolidation.   Original Report Authenticated By: Burman Nieves, M.D.    Microbiology: No results found for this or any previous visit (from the past 240 hour(s)).   Labs: Basic Metabolic Panel:  Recent Labs Lab 05/14/13 2220 05/15/13 0526 05/16/13 0440 05/17/13 0445 05/18/13 0513  NA 141 140 138 135 135  K 5.3* 4.9 4.7 5.0 4.8  CL 104 103 101 98 98  CO2 30 27 34* 29 27  GLUCOSE 89 184* 98 131* 117*  BUN 30* 34* 43* 45* 50*  CREATININE 1.40* 1.43* 1.54* 1.35 1.34  CALCIUM 9.6 9.8 9.9 10.0 9.6   Liver Function Tests:  Recent Labs Lab 05/14/13 2220 05/15/13 0526  AST 18 14  ALT 14 14  ALKPHOS 86 81  BILITOT 0.2* 0.2*  PROT 7.1 6.9  ALBUMIN 3.6 3.4*   No results found for this basename: LIPASE, AMYLASE,  in the last 168 hours No results found for this basename: AMMONIA,  in the last 168 hours CBC:  Recent Labs Lab 05/14/13 1141 05/14/13 2220 05/15/13 0526 05/16/13 0440 05/17/13 0445  WBC 9.2 10.6* 8.5 13.0* 18.3*  NEUTROABS 6.4 7.1 7.8*  --   --   HGB 14.2 14.9 14.7 14.1 14.9  HCT 44.8 49.0 47.5 46.8 48.1  MCV 78.1 80.2 80.0 79.5 78.0  PLT 148.0* 150 144* 148* 155   Cardiac Enzymes:  Recent Labs Lab 05/15/13 0526  TROPONINI <0.30   BNP: BNP (last 3 results)  Recent Labs  10/09/12 0925  10/12/12 0932 05/15/13 0526  PROBNP 125.0* 939.9* 805.5*   CBG: No results found for this basename: GLUCAP,  in the last 168 hours  Additional labs:    Signed:  HONGALGI,ANAND  [facility] 05/18/2013, 11:49 AM

## 2013-05-22 ENCOUNTER — Encounter: Payer: Self-pay | Admitting: Internal Medicine

## 2013-05-24 NOTE — ED Provider Notes (Signed)
History    CSN: [id number] Arrival date & time 05/15/13  0506  First MD Initiated Contact with Patient 05/15/13 [phone]     Chief Complaint  Patient presents with  . Respiratory Distress  . COPD   (Consider location/radiation/quality/duration/timing/severity/associated sxs/prior Treatment) Patient is a 78 y.o. male presenting with shortness of breath. The history is provided by the patient.  Shortness of Breath Severity:  Severe Onset quality:  Sudden Duration:  1 hour Timing:  Constant Progression:  Improving Chronicity:  Recurrent Context comment:  Seen in the ER and treated for COPD with inhalers and steroids and was feeling good and able to walk without SOB and then at 3am again developed severe SOB Relieved by:  Inhaler Worsened by:  Nothing tried Ineffective treatments:  None tried Associated symptoms: cough and wheezing   Associated symptoms: no abdominal pain, no chest pain, no fever, no hemoptysis, no rash and no vomiting   Risk factors: no family hx of DVT, no hx of PE/DVT and no recent surgery    Past Medical History  Diagnosis Date  . Polycythemia vera(238.4)     a. [facility]  will restart his phlebotomies from about Mar 15 2011  . Stomach ulcer   . Tobacco abuse     a. cigars  . Duodenal perforation June 2012  . Peritonitis June 2012  . Pneumonia April 2012  . Systolic CHF, chronic     a. 12/2011 Echo: EF 25-30%, mid-dist antsept/inf, apical AK, Gr 1 DD, Triv AI, Mild MR.  . Ischemic cardiomyopathy     a. 01/2012 S/P MDT Protecta single lead ICD, ser # [id number]  . History of DVT (deep vein thrombosis)   . Chronic bronchitis   . CAD (coronary artery disease)     a. 07/2011 Anterior apical STEMI/Cath/PCI: LM nl, LAD 100p/m (2.5 x 23mm Mini-Vision BMS), LCX 40p, RCA dominant, nl, EF 30%.  Marland Kitchen GERD (gastroesophageal reflux disease)   . Myocardial infarction   . Shortness of breath   . Asthma   .  COPD (chronic obstructive pulmonary disease)    Past Surgical History  Procedure Laterality Date  . Cholecystectomy  03/2011    Dr. Carolynne Edouard  . Colon surgery    . Cardiac catheterization  Sept 2012    Normal left main, occluded LAD, 40% LCX and normal RCA. EF is 30%  . US echocardiography  Sept 2012    EF 25 to 30% with akinesis of the mid to distal anterior and apical myocardium, trivial AI and no apical thrombus  . Cardiac defibrillator placement      single chamber  . Shoulder arthroscopy      left, rotatotor cuff tendinopathy   Family History  Problem Relation Age of Onset  . Lung disease Father    History  Substance Use Topics  . Smoking status: Current Every Day Smoker -- 0.50 packs/day for 60 years    Types: Cigars    Last Attempt to Quit: 03/04/2011  . Smokeless tobacco: Current User    Types: Chew     Comment: Smoked .5-1ppd of cigarettes from age 39 to a few yrs ago and has been smoking 10 small cigars daily since then.  . Alcohol Use: Yes     Comment: rarely     Review of Systems  Constitutional: Negative for fever.  Respiratory: Positive for cough, shortness of breath and wheezing. Negative for hemoptysis.   Cardiovascular: Negative for chest  pain.  Gastrointestinal: Negative for vomiting and abdominal pain.  Skin: Negative for rash.  All other systems reviewed and are negative.    Allergies  Review of patient's allergies indicates no known allergies.  Home Medications   Current Outpatient Rx  Name  Route  Sig  Dispense  Refill  . aspirin EC 81 MG tablet   Oral   Take 81 mg by mouth daily.         . carvedilol (COREG) 25 MG tablet   Oral   Take 25 mg by mouth 2 (two) times daily with a meal.         . lovastatin (MEVACOR) 20 MG tablet   Oral   Take 20 mg by mouth at bedtime.         Marland Kitchen omeprazole (PRILOSEC) 20 MG capsule   Oral   Take 20 mg by mouth daily.         Marland Kitchen albuterol (PROVENTIL HFA;VENTOLIN HFA) 108 (90 BASE) MCG/ACT inhaler    Inhalation   Inhale 2 puffs into the lungs every 6 (six) hours as needed for wheezing or shortness of breath.   18 g   0   . doxycycline (VIBRA-TABS) 100 MG tablet   Oral   Take 1 tablet (100 mg total) by mouth every 12 (twelve) hours.   3 tablet   0   . furosemide (LASIX) 40 MG tablet   Oral   Take 1 tablet (40 mg total) by mouth daily.   30 tablet   0   . ipratropium-albuterol (DUONEB) 0.5-2.5 (3) MG/3ML SOLN   Nebulization   Take 3 mLs by nebulization every 6 (six) hours as needed (Wheezing or shortness of breath.).   360 mL   0   . lisinopril (PRINIVIL,ZESTRIL) 10 MG tablet   Oral   Take 1 tablet (10 mg total) by mouth daily.   30 tablet   0   . predniSONE (DELTASONE) 10 MG tablet      Take 4 tablets daily x2 days, then 3 tablets daily x2 days, then 2 tablets daily x2 days, then 1 tablet daily x2 days, then stop.   20 tablet   0    BP 128/78  Pulse 70  Temp(Src) 97.5 F (36.4 C) (Oral)  Resp 19  Ht 6' (1.829 m)  Wt 205 lb 11 oz (93.3 kg)  BMI 27.89 kg/m2  SpO2 94% Physical Exam  Constitutional: He is oriented to person, place, and time. He appears well-developed and well-nourished. No distress.  HENT:  Head: Normocephalic and atraumatic.  Right Ear: External ear normal.  Left Ear: External ear normal.  Mouth/Throat: Oropharynx is clear and moist.  Eyes: Conjunctivae and EOM are normal. Pupils are equal, round, and reactive to light. Right eye exhibits no discharge.  Neck: Normal range of motion. Neck supple.  Cardiovascular: Normal rate, regular rhythm, normal heart sounds and intact distal pulses.   No murmur heard. Pulmonary/Chest: Tachypnea noted. No respiratory distress. He has no decreased breath sounds. He has wheezes. He has no rhonchi. He has no rales.  Abdominal: Soft. There is no tenderness.  Musculoskeletal: Normal range of motion. He exhibits no edema and no tenderness.  Neurological: He is alert and oriented to person, place, and time.   Skin: Skin is warm and dry. No rash noted.  Psychiatric: He has a normal mood and affect.    ED Course  Procedures (including critical care time) Labs Reviewed  PRO B NATRIURETIC PEPTIDE - Abnormal; Notable  for the following:    Pro B Natriuretic peptide (BNP) 805.5 (*)    All other components within normal limits  COMPREHENSIVE METABOLIC PANEL - Abnormal; Notable for the following:    Glucose, Bld 184 (*)    BUN 34 (*)    Creatinine, Ser 1.43 (*)    Albumin 3.4 (*)    Total Bilirubin 0.2 (*)    GFR calc non Af Amer 47 (*)    GFR calc Af Amer 54 (*)    All other components within normal limits  CBC WITH DIFFERENTIAL - Abnormal; Notable for the following:    RBC 5.94 (*)    MCH 24.7 (*)    RDW 16.9 (*)    Platelets 144 (*)    Neutrophils Relative % 92 (*)    Neutro Abs 7.8 (*)    Lymphocytes Relative 8 (*)    Monocytes Relative 1 (*)    Monocytes Absolute 0.0 (*)    All other components within normal limits  BASIC METABOLIC PANEL - Abnormal; Notable for the following:    CO2 34 (*)    BUN 43 (*)    Creatinine, Ser 1.54 (*)    GFR calc non Af Amer 43 (*)    GFR calc Af Amer 50 (*)    All other components within normal limits  CBC - Abnormal; Notable for the following:    WBC 13.0 (*)    RBC 5.89 (*)    MCH 23.9 (*)    RDW 17.0 (*)    Platelets 148 (*)    All other components within normal limits  BASIC METABOLIC PANEL - Abnormal; Notable for the following:    Glucose, Bld 131 (*)    BUN 45 (*)    GFR calc non Af Amer 50 (*)    GFR calc Af Amer 58 (*)    All other components within normal limits  CBC - Abnormal; Notable for the following:    WBC 18.3 (*)    RBC 6.17 (*)    MCH 24.1 (*)    RDW 16.6 (*)    All other components within normal limits  BASIC METABOLIC PANEL - Abnormal; Notable for the following:    Glucose, Bld 117 (*)    BUN 50 (*)    GFR calc non Af Amer 51 (*)    GFR calc Af Amer 59 (*)    All other components within normal limits  TROPONIN I    No results found. 1. COPD exacerbation   2. CAD (coronary artery disease)   3. Chronic systolic heart failure   4. SOB (shortness of breath)   5. Acute on chronic systolic CHF (congestive heart failure)   6. Hyperkalemia   7. COPD exacerbation, likely   8. Polycythemia vera(238.4)   9. Bruises easily   10. Duodenal perforation   11. Acute stomach ulcer   12. Ecchymosis   13. HTN (hypertension)   14. Ischemic cardiomyopathy   15. GERD (gastroesophageal reflux disease)   16. Hyperlipidemia   17. Automatic implantable cardioverter-defibrillator- medtronic   18. Environmental allergies   19. Acute renal failure   20. Acute-on-chronic respiratory failure     MDM  Per EMS pt was seen here last night, discharged about 1 am w/ COPD exacerbation, about 3 am started feeling short of breath again, exp wheezing in all fields, pt given duoneb and another 5mg  albuterol by EMS, BP 119/68, HR 86, 98% RA, RR 22.  Will admit for further  care  Gwyneth Sprout, MD 05/24/13 1332

## 2013-05-30 ENCOUNTER — Encounter: Payer: Medicare Other | Admitting: Physician Assistant

## 2013-06-05 ENCOUNTER — Telehealth: Payer: Self-pay | Admitting: *Deleted

## 2013-06-05 DIAGNOSIS — D45 Polycythemia vera: Secondary | ICD-10-CM

## 2013-06-05 NOTE — Telephone Encounter (Signed)
Called patient back to discuss appts, his blood counts are rising and he fears he will need a phlebotomy soon. Confirmed with patient that we will get him scheduled next week and call him back with appt times.

## 2013-06-05 NOTE — Telephone Encounter (Signed)
Pt called wanting an appt to see GCM  gv appt for 08/14/13 @ 2:30pm. Pt refused the d/t wants something sooner. i transfer him to [facility]....td

## 2013-06-06 ENCOUNTER — Telehealth: Payer: Self-pay | Admitting: *Deleted

## 2013-06-06 ENCOUNTER — Telehealth: Payer: Self-pay | Admitting: Internal Medicine

## 2013-06-06 ENCOUNTER — Encounter: Payer: Self-pay | Admitting: Oncology

## 2013-06-06 NOTE — Telephone Encounter (Signed)
Per staff message and POF I have scheduled appts.  JMW  

## 2013-06-06 NOTE — Telephone Encounter (Signed)
S/w the pt and he is aware of his aug appts for lab,md and phlebotomy.

## 2013-06-10 ENCOUNTER — Ambulatory Visit: Payer: Medicare Other | Admitting: Physician Assistant

## 2013-06-10 ENCOUNTER — Other Ambulatory Visit: Payer: Medicare Other | Admitting: Lab

## 2013-06-12 ENCOUNTER — Other Ambulatory Visit: Payer: Self-pay

## 2013-06-13 ENCOUNTER — Encounter: Payer: Self-pay | Admitting: Cardiology

## 2013-06-13 ENCOUNTER — Encounter: Payer: Medicare Other | Admitting: Physician Assistant

## 2013-06-13 ENCOUNTER — Ambulatory Visit (INDEPENDENT_AMBULATORY_CARE_PROVIDER_SITE_OTHER): Payer: Medicare Other | Admitting: Cardiology

## 2013-06-13 VITALS — BP 136/76 | HR 70 | Ht 72.0 in | Wt 207.0 lb

## 2013-06-13 DIAGNOSIS — I5022 Chronic systolic (congestive) heart failure: Secondary | ICD-10-CM

## 2013-06-13 DIAGNOSIS — I255 Ischemic cardiomyopathy: Secondary | ICD-10-CM

## 2013-06-13 DIAGNOSIS — I2589 Other forms of chronic ischemic heart disease: Secondary | ICD-10-CM

## 2013-06-13 DIAGNOSIS — D45 Polycythemia vera: Secondary | ICD-10-CM

## 2013-06-13 DIAGNOSIS — I2510 Atherosclerotic heart disease of native coronary artery without angina pectoris: Secondary | ICD-10-CM

## 2013-06-13 NOTE — Progress Notes (Signed)
[name] Date of Birth: [birth year] Medical Record #[id number]  History of Present Illness: [name] is seen back today for a followup visit. 77  He had a large anterior apical MI with BMS to the LAD in September 2012. He was felt to have presented late. EF is 25%. He underwent placement of a prophylactic ICD in early March 2013. He was recently admitted from July 9 through the 12th. This was with increased dyspnea and probably a COPD exacerbation. He was treated with nebulizer therapy and steroids. He was gently diuresed. He was discharged on home oxygen. He states his breathing is back to baseline now. He has no cough. He denies any chest pain. His weight is up a little bit. He is scheduled for phlebotomy tomorrow for his polycythemia. He feels that he no longer needs his oxygen. He denies any orthopnea, PND, or increase in edema. At discharge his Lasix dose was increased from 20-40 mg a day.  Current Outpatient Prescriptions on File Prior to Visit  Medication Sig Dispense Refill  . albuterol (PROVENTIL HFA;VENTOLIN HFA) 108 (90 BASE) MCG/ACT inhaler Inhale 2 puffs into the lungs every 6 (six) hours as needed for wheezing or shortness of breath.  18 g  0  . aspirin EC 81 MG tablet Take 81 mg by mouth daily.      . carvedilol (COREG) 25 MG tablet Take 25 mg by mouth 2 (two) times daily with a meal.      . doxycycline (VIBRA-TABS) 100 MG tablet Take 1 tablet (100 mg total) by mouth every 12 (twelve) hours.  3 tablet  0  . furosemide (LASIX) 40 MG tablet Take 1 tablet (40 mg total) by mouth daily.  30 tablet  0  . ipratropium-albuterol (DUONEB) 0.5-2.5 (3) MG/3ML SOLN Take 3 mLs by nebulization every 6 (six) hours as needed (Wheezing or shortness of breath.).  360 mL  0  . lisinopril (PRINIVIL,ZESTRIL) 10 MG tablet Take 1 tablet (10 mg total) by mouth daily.  30 tablet  0  . lovastatin (MEVACOR) 20 MG tablet Take 20 mg by mouth at bedtime.      Marland Kitchen omeprazole (PRILOSEC) 20 MG capsule Take 20 mg  by mouth daily.      . predniSONE (DELTASONE) 10 MG tablet Take 4 tablets daily x2 days, then 3 tablets daily x2 days, then 2 tablets daily x2 days, then 1 tablet daily x2 days, then stop.  20 tablet  0   No current facility-administered medications on file prior to visit.    No Known Allergies  Past Medical History  Diagnosis Date  . Polycythemia vera(238.4)     a. [facility]  will restart his phlebotomies from about Mar 15 2011  . Stomach ulcer   . Tobacco abuse     a. cigars  . Duodenal perforation June 2012  . Peritonitis June 2012  . Pneumonia April 2012  . Systolic CHF, chronic     a. 12/2011 Echo: EF 25-30%, mid-dist antsept/inf, apical AK, Gr 1 DD, Triv AI, Mild MR.  . Ischemic cardiomyopathy     a. 01/2012 S/P MDT Protecta single lead ICD, ser # [id number]  . History of DVT (deep vein thrombosis)   . Chronic bronchitis   . CAD (coronary artery disease)     a. 07/2011 Anterior apical STEMI/Cath/PCI: LM nl, LAD 100p/m (2.5 x 23mm Mini-Vision BMS), LCX 40p, RCA dominant, nl, EF 30%.  Marland Kitchen GERD (gastroesophageal  reflux disease)   . Myocardial infarction   . Shortness of breath   . Asthma   . COPD (chronic obstructive pulmonary disease)     Past Surgical History  Procedure Laterality Date  . Cholecystectomy  03/2011    Dr. Carolynne Edouard  . Colon surgery    . Cardiac catheterization  Sept 2012    Normal left main, occluded LAD, 40% LCX and normal RCA. EF is 30%  . US echocardiography  Sept 2012    EF 25 to 30% with akinesis of the mid to distal anterior and apical myocardium, trivial AI and no apical thrombus  . Cardiac defibrillator placement      single chamber  . Shoulder arthroscopy      left, rotatotor cuff tendinopathy    History  Smoking status  . Current Every Day Smoker -- 0.50 packs/day for 60 years  . Types: Cigars  . Last Attempt to Quit: 03/04/2011  Smokeless tobacco  . Current User  . Types: Chew     Comment: Smoked .5-1ppd of cigarettes from age 77 to a few yrs ago and has been smoking 10 small cigars daily since then.    History  Alcohol Use  . Yes    Comment: rarely     Family History  Problem Relation Age of Onset  . Lung disease Father     Review of Systems: The review of systems is positive for periodic phlebotomy for polycythemia.   All other systems were reviewed and are negative.  Physical Exam: BP 136/76  Pulse 70  Ht 6' (1.829 m)  Wt 207 lb (93.895 kg)  BMI 28.07 kg/m2  SpO2 97% Patient is  a pleasant elderly white male  in no acute distress. Skin is warm and dry. Color is normal.  HEENT is unremarkable. Normocephalic/atraumatic. PERRL. Sclera are nonicteric. Neck is supple. No masses. No JVD. Lungs are clear. Cardiac exam shows a regular rate and rhythm. S1 and S2 are normal. No gallop or murmur. Abdomen is soft. No masses or bruits. Extremities have trace edema. Gait and ROM are intact. No gross neurologic deficits noted.  LABORATORY DATA: ICD assessment on 05/14/2013 was satisfactory.   Assessment / Plan: 1. Chronic systolic congestive heart failure. We will continue with his higher dose of Lasix 40 mg daily. He is already optimized on his carvedilol and ACE inhibitor. Continue sodium restriction. I will followup again in 6 months.  2. Status post prophylactic ICD.  3. Hypertension, controlled.  4. Coronary disease status post anterior myocardial infarction in September of 2012. He had a bare-metal stent placed to the LAD but he was felt to presented late in his course. He has a result an ischemic cardiomyopathy. He had no other obstructive coronary disease.  5. Tobacco use. Patient reports that he has quit smoking as of his recent hospital stay. I've encouraged continued cessation.  6. COPD with exacerbation in early July. Clinically improved. Oxygen levels are normal today. He may use oxygen as needed.

## 2013-06-13 NOTE — Patient Instructions (Addendum)
Congratulations on stopping smoking  Continue your current medication.  I will see you in 6 months.

## 2013-06-14 ENCOUNTER — Ambulatory Visit (HOSPITAL_BASED_OUTPATIENT_CLINIC_OR_DEPARTMENT_OTHER): Payer: Medicare Other | Admitting: Physician Assistant

## 2013-06-14 ENCOUNTER — Telehealth: Payer: Self-pay | Admitting: *Deleted

## 2013-06-14 ENCOUNTER — Encounter: Payer: Self-pay | Admitting: Physician Assistant

## 2013-06-14 ENCOUNTER — Other Ambulatory Visit (HOSPITAL_BASED_OUTPATIENT_CLINIC_OR_DEPARTMENT_OTHER): Payer: Medicare Other | Admitting: Lab

## 2013-06-14 VITALS — BP 165/81 | HR 76 | Temp 98.2°F | Resp 20 | Ht 72.0 in | Wt 209.0 lb

## 2013-06-14 DIAGNOSIS — D45 Polycythemia vera: Secondary | ICD-10-CM

## 2013-06-14 LAB — CBC WITH DIFFERENTIAL/PLATELET
Basophils Absolute: 0 10*3/uL (ref 0.0–0.1)
EOS%: 3.2 % (ref 0.0–7.0)
HCT: 46.7 % (ref 38.4–49.9)
HGB: 14.6 g/dL (ref 13.0–17.1)
MCH: 25.3 pg — ABNORMAL LOW (ref 27.2–33.4)
MCV: 80.9 fL (ref 79.3–98.0)
MONO%: 7.7 % (ref 0.0–14.0)
NEUT%: 66.8 % (ref 39.0–75.0)
Platelets: 124 10*3/uL — ABNORMAL LOW (ref 140–400)

## 2013-06-14 NOTE — Telephone Encounter (Signed)
appts made and printed. Pt is aware that phlebotomy will follow his labs. i emailed MB to add the tx's...td
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
## 2013-06-21 ENCOUNTER — Other Ambulatory Visit (HOSPITAL_BASED_OUTPATIENT_CLINIC_OR_DEPARTMENT_OTHER): Payer: Medicare Other | Admitting: Lab

## 2013-06-21 ENCOUNTER — Ambulatory Visit: Payer: Medicare Other

## 2013-06-21 DIAGNOSIS — D45 Polycythemia vera: Secondary | ICD-10-CM

## 2013-06-21 LAB — CBC WITH DIFFERENTIAL/PLATELET
Eosinophils Absolute: 0.1 10*3/uL (ref 0.0–0.5)
HCT: 46.8 % (ref 38.4–49.9)
LYMPH%: 17.6 % (ref 14.0–49.0)
MCHC: 30.6 g/dL — ABNORMAL LOW (ref 32.0–36.0)
MCV: 81.5 fL (ref 79.3–98.0)
MONO%: 7.8 % (ref 0.0–14.0)
NEUT#: 6.3 10*3/uL (ref 1.5–6.5)
NEUT%: 73.4 % (ref 39.0–75.0)
Platelets: 113 10*3/uL — ABNORMAL LOW (ref 140–400)
RBC: 5.74 10*6/uL (ref 4.20–5.82)

## 2013-06-21 NOTE — Progress Notes (Signed)
Hct 46.7.  Treatment parameters not met - no phlebotomy today.

## 2013-06-28 ENCOUNTER — Ambulatory Visit: Payer: Medicare Other

## 2013-06-28 ENCOUNTER — Other Ambulatory Visit (HOSPITAL_BASED_OUTPATIENT_CLINIC_OR_DEPARTMENT_OTHER): Payer: Medicare Other | Admitting: Lab

## 2013-06-28 DIAGNOSIS — D45 Polycythemia vera: Secondary | ICD-10-CM

## 2013-06-28 LAB — CBC WITH DIFFERENTIAL/PLATELET
BASO%: 0.2 % (ref 0.0–2.0)
EOS%: 1.4 % (ref 0.0–7.0)
LYMPH%: 16.8 % (ref 14.0–49.0)
MCH: 24.7 pg — ABNORMAL LOW (ref 27.2–33.4)
MCHC: 30.3 g/dL — ABNORMAL LOW (ref 32.0–36.0)
MONO#: 0.8 10*3/uL (ref 0.1–0.9)
MONO%: 7.7 % (ref 0.0–14.0)
NEUT%: 73.9 % (ref 39.0–75.0)
Platelets: 135 10*3/uL — ABNORMAL LOW (ref 140–400)
RBC: 5.82 10*6/uL (ref 4.20–5.82)
WBC: 9.9 10*3/uL (ref 4.0–10.3)

## 2013-06-28 NOTE — Progress Notes (Signed)
Pt HCT 47.6 today. Per Office note - HCT to be greater than 50 for phlebotomy. Parameters not met. Informed pt no phlebotomy today.

## 2013-07-05 ENCOUNTER — Telehealth: Payer: Self-pay | Admitting: Oncology

## 2013-07-05 ENCOUNTER — Ambulatory Visit: Payer: Medicare Other

## 2013-07-05 ENCOUNTER — Other Ambulatory Visit (HOSPITAL_BASED_OUTPATIENT_CLINIC_OR_DEPARTMENT_OTHER): Payer: Medicare Other | Admitting: Lab

## 2013-07-05 DIAGNOSIS — D45 Polycythemia vera: Secondary | ICD-10-CM

## 2013-07-05 LAB — CBC WITH DIFFERENTIAL/PLATELET
BASO%: 0.4 % (ref 0.0–2.0)
Basophils Absolute: 0 10*3/uL (ref 0.0–0.1)
EOS%: 1.8 % (ref 0.0–7.0)
HCT: 45.6 % (ref 38.4–49.9)
MCH: 25.6 pg — ABNORMAL LOW (ref 27.2–33.4)
MCHC: 32.4 g/dL (ref 32.0–36.0)
MCV: 79 fL — ABNORMAL LOW (ref 79.3–98.0)
MONO#: 0.9 10*3/uL (ref 0.1–0.9)
MONO%: 10.5 % (ref 0.0–14.0)
Platelets: 125 10*3/uL — ABNORMAL LOW (ref 140–400)
RBC: 5.78 10*6/uL (ref 4.20–5.82)
RDW: 20.1 % — ABNORMAL HIGH (ref 11.0–14.6)
WBC: 8.8 10*3/uL (ref 4.0–10.3)
lymph#: 1 10*3/uL (ref 0.9–3.3)

## 2013-07-05 NOTE — Progress Notes (Signed)
Per office note, HCT needs to be >50. Today's HCT is 45.6. Patient doesn't meet requirements to have a phlebotomy. Patient verbalized understanding.

## 2013-07-05 NOTE — Telephone Encounter (Signed)
Pt's last lb/phleb was today and he stopped by scheduling to see when his next set of appointments would be. Last pof was sent 8/8 and pt has not further orders. Message to GM/Val to send pof for next appts and asked that Val call pt - per pt request.

## 2013-07-07 ENCOUNTER — Other Ambulatory Visit: Payer: Self-pay | Admitting: Oncology

## 2013-07-07 DIAGNOSIS — D45 Polycythemia vera: Secondary | ICD-10-CM

## 2013-07-09 ENCOUNTER — Other Ambulatory Visit: Payer: Self-pay | Admitting: Physician Assistant

## 2013-08-01 ENCOUNTER — Encounter: Payer: Self-pay | Admitting: Physician Assistant

## 2013-08-01 ENCOUNTER — Encounter: Payer: Self-pay | Admitting: Cardiology

## 2013-08-01 ENCOUNTER — Ambulatory Visit (INDEPENDENT_AMBULATORY_CARE_PROVIDER_SITE_OTHER): Payer: Medicare Other | Admitting: Cardiology

## 2013-08-01 VITALS — BP 156/75 | HR 63 | Ht 72.0 in | Wt 213.0 lb

## 2013-08-01 DIAGNOSIS — I10 Essential (primary) hypertension: Secondary | ICD-10-CM

## 2013-08-01 DIAGNOSIS — I2510 Atherosclerotic heart disease of native coronary artery without angina pectoris: Secondary | ICD-10-CM

## 2013-08-01 DIAGNOSIS — I255 Ischemic cardiomyopathy: Secondary | ICD-10-CM

## 2013-08-01 DIAGNOSIS — I5022 Chronic systolic (congestive) heart failure: Secondary | ICD-10-CM

## 2013-08-01 DIAGNOSIS — I2589 Other forms of chronic ischemic heart disease: Secondary | ICD-10-CM

## 2013-08-01 NOTE — Progress Notes (Signed)
[name] Date of Birth: 10/11/1936 Medical Record #[id number]  History of Present Illness: Mr. Cromartie is seen back today for a followup visit.  He had a large anterior apical MI with BMS to the LAD in September 2012. He was felt to have presented late. EF is 25%. He underwent placement of a prophylactic ICD in early March 2013. He also has a history of severe COPD. He reports that he quit smoking in July. His breathing is doing well followup today. He does occasionally use a nebulizer with good effect. He has no significant cough or chest pain. He has gained 6 pounds but he attributes this to into much. He has had no increase in edema.  Current Outpatient Prescriptions on File Prior to Visit  Medication Sig Dispense Refill  . albuterol (PROVENTIL HFA;VENTOLIN HFA) 108 (90 BASE) MCG/ACT inhaler Inhale 2 puffs into the lungs every 6 (six) hours as needed for wheezing or shortness of breath.  18 g  0  . aspirin EC 81 MG tablet Take 81 mg by mouth daily.      . carvedilol (COREG) 25 MG tablet Take 25 mg by mouth 2 (two) times daily with a meal.      . doxycycline (VIBRA-TABS) 100 MG tablet Take 1 tablet (100 mg total) by mouth every 12 (twelve) hours.  3 tablet  0  . furosemide (LASIX) 40 MG tablet Take 1 tablet (40 mg total) by mouth daily.  30 tablet  0  . ipratropium-albuterol (DUONEB) 0.5-2.5 (3) MG/3ML SOLN Take 3 mLs by nebulization every 6 (six) hours as needed (Wheezing or shortness of breath.).  360 mL  0  . lisinopril (PRINIVIL,ZESTRIL) 10 MG tablet Take 1 tablet (10 mg total) by mouth daily.  30 tablet  0  . lovastatin (MEVACOR) 20 MG tablet Take 20 mg by mouth at bedtime.      Marland Kitchen omeprazole (PRILOSEC) 20 MG capsule Take 20 mg by mouth daily.      . predniSONE (DELTASONE) 10 MG tablet Take 4 tablets daily x2 days, then 3 tablets daily x2 days, then 2 tablets daily x2 days, then 1 tablet daily x2 days, then stop.  20 tablet  0   No current facility-administered medications on file  prior to visit.    No Known Allergies  Past Medical History  Diagnosis Date  . Polycythemia vera(238.4)     a. [facility]  will restart his phlebotomies from about Mar 15 2011  . Stomach ulcer   . Tobacco abuse     a. cigars  . Duodenal perforation June 2012  . Peritonitis June 2012  . Pneumonia April 2012  . Systolic CHF, chronic     a. 12/2011 Echo: EF 25-30%, mid-dist antsept/inf, apical AK, Gr 1 DD, Triv AI, Mild MR.  . Ischemic cardiomyopathy     a. 01/2012 S/P MDT Protecta single lead ICD, ser # [id number]  . History of DVT (deep vein thrombosis)   . Chronic bronchitis   . CAD (coronary artery disease)     a. 07/2011 Anterior apical STEMI/Cath/PCI: LM nl, LAD 100p/m (2.5 x 23mm Mini-Vision BMS), LCX 40p, RCA dominant, nl, EF 30%.  Marland Kitchen GERD (gastroesophageal reflux disease)   . Myocardial infarction   . Shortness of breath   . Asthma   . COPD (chronic obstructive pulmonary disease)     Past Surgical History  Procedure Laterality Date  . Cholecystectomy  03/2011  Dr. Carolynne Edouard  . Colon surgery    . Cardiac catheterization  Sept 2012    Normal left main, occluded LAD, 40% LCX and normal RCA. EF is 30%  . US echocardiography  Sept 2012    EF 25 to 30% with akinesis of the mid to distal anterior and apical myocardium, trivial AI and no apical thrombus  . Cardiac defibrillator placement      single chamber  . Shoulder arthroscopy      left, rotatotor cuff tendinopathy    History  Smoking status  . Former Smoker -- 0.50 packs/day for 60 years  . Types: Cigars  . Quit date: 05/18/2013  Smokeless tobacco  . Current User  . Types: Chew    Comment: Smoked .5-1ppd of cigarettes from age 77 to a few yrs ago and has been smoking 10 small cigars daily since then.    History  Alcohol Use  . Yes    Comment: rarely     Family History  Problem Relation Age of Onset  . Lung disease Father     Review of  Systems: The review of systems is positive for periodic phlebotomy for polycythemia.   All other systems were reviewed and are negative.  Physical Exam: BP 156/75  Pulse 63  Ht 6' (1.829 m)  Wt 96.616 kg (213 lb)  BMI 28.88 kg/m2 Patient is  a pleasant elderly white male  in no acute distress. Skin is warm and dry. Color is normal.  HEENT is unremarkable. Normocephalic/atraumatic. PERRL. Sclera are nonicteric. Neck is supple. No masses. No JVD. Lungs are clear. Cardiac exam shows a regular rate and rhythm. S1 and S2 are normal. No gallop or murmur. Abdomen is soft. No masses or bruits. Extremities have trace edema. Gait and ROM are intact. No gross neurologic deficits noted.  LABORATORY DATA:    Assessment / Plan: 1. Chronic systolic congestive heart failure. We will continue with his higher dose of Lasix 40 mg daily. He is already optimized on his carvedilol and ACE inhibitor. Continue sodium restriction. I will followup again in 6 months.  2. Status post prophylactic ICD.  3. Hypertension, controlled.  4. Coronary disease status post anterior myocardial infarction in September of 2012. He had a bare-metal stent placed to the LAD but he was felt to presented late in his course. He has a result an ischemic cardiomyopathy. He had no other obstructive coronary disease.  5. Tobacco use. Have congratulated him on his smoking cessation.  6. COPD

## 2013-08-01 NOTE — Patient Instructions (Signed)
Continue your current therapy  I will see you in 6 months.   

## 2013-08-02 ENCOUNTER — Telehealth: Payer: Self-pay | Admitting: Oncology

## 2013-08-02 ENCOUNTER — Other Ambulatory Visit: Payer: Self-pay | Admitting: *Deleted

## 2013-08-02 DIAGNOSIS — D45 Polycythemia vera: Secondary | ICD-10-CM

## 2013-08-02 NOTE — Telephone Encounter (Signed)
s.w. pt and advised on Feb 2015 appt...pt ok and aware

## 2013-08-14 ENCOUNTER — Ambulatory Visit: Payer: Medicare Other | Admitting: Oncology

## 2013-08-19 ENCOUNTER — Ambulatory Visit (INDEPENDENT_AMBULATORY_CARE_PROVIDER_SITE_OTHER): Payer: Medicare Other | Admitting: *Deleted

## 2013-08-19 DIAGNOSIS — I255 Ischemic cardiomyopathy: Secondary | ICD-10-CM

## 2013-08-19 DIAGNOSIS — Z95810 Presence of automatic (implantable) cardiac defibrillator: Secondary | ICD-10-CM

## 2013-08-19 DIAGNOSIS — I5022 Chronic systolic (congestive) heart failure: Secondary | ICD-10-CM

## 2013-08-19 DIAGNOSIS — I2589 Other forms of chronic ischemic heart disease: Secondary | ICD-10-CM

## 2013-08-20 LAB — REMOTE ICD DEVICE
BATTERY VOLTAGE: 3.1462 V
BRDY-0002RV: 40 {beats}/min
CHARGE TIME: 8.988 s
DEV-0020ICD: NEGATIVE
FVT: 0
RV LEAD AMPLITUDE: 6.8 mv
TOT-0001: 1
TZAT-0001FASTVT: 1
TZAT-0001SLOWVT: 1
TZAT-0002SLOWVT: NEGATIVE
TZAT-0012FASTVT: 200 ms
TZAT-0012SLOWVT: 200 ms
TZAT-0018FASTVT: NEGATIVE
TZST-0001FASTVT: 4
TZST-0001FASTVT: 5
TZST-0001FASTVT: 6
TZST-0001SLOWVT: 3
TZST-0001SLOWVT: 4
TZST-0001SLOWVT: 5
TZST-0002FASTVT: NEGATIVE
TZST-0002FASTVT: NEGATIVE
TZST-0002SLOWVT: NEGATIVE
TZST-0002SLOWVT: NEGATIVE
VENTRICULAR PACING ICD: 0.01 pct
VF: 0

## 2013-08-28 ENCOUNTER — Encounter: Payer: Self-pay | Admitting: *Deleted

## 2013-09-12 ENCOUNTER — Other Ambulatory Visit: Payer: Self-pay

## 2013-09-13 ENCOUNTER — Encounter: Payer: Self-pay | Admitting: Internal Medicine

## 2013-10-29 ENCOUNTER — Other Ambulatory Visit: Payer: Self-pay | Admitting: Physician Assistant

## 2013-10-30 ENCOUNTER — Telehealth: Payer: Self-pay | Admitting: *Deleted

## 2013-10-30 NOTE — Telephone Encounter (Signed)
sw pt informed him that [facility] will be out of the office on 12/09/2013. gv appt for 12/10/13 w/ labs@ 10am and ov@ 10:30am. Pt is aware...td

## 2013-11-20 ENCOUNTER — Encounter: Payer: Medicare Other | Admitting: *Deleted

## 2013-11-20 DIAGNOSIS — I255 Ischemic cardiomyopathy: Secondary | ICD-10-CM

## 2013-11-20 DIAGNOSIS — Z95810 Presence of automatic (implantable) cardiac defibrillator: Secondary | ICD-10-CM

## 2013-11-20 DIAGNOSIS — I5022 Chronic systolic (congestive) heart failure: Secondary | ICD-10-CM

## 2013-11-20 LAB — MDC_IDC_ENUM_SESS_TYPE_REMOTE
Date Time Interrogation Session: 20150114083427
HIGH POWER IMPEDANCE MEASURED VALUE: 209 Ohm
HighPow Impedance: 494 Ohm
HighPow Impedance: 72 Ohm
Lead Channel Impedance Value: 570 Ohm
Lead Channel Pacing Threshold Amplitude: 0.375 V
Lead Channel Pacing Threshold Pulse Width: 0.4 ms
Lead Channel Setting Pacing Amplitude: 2.5 V
MDC IDC MSMT BATTERY VOLTAGE: 3.15 V
MDC IDC MSMT LEADCHNL RV SENSING INTR AMPL: 7.75 mV
MDC IDC MSMT LEADCHNL RV SENSING INTR AMPL: 7.75 mV
MDC IDC SET LEADCHNL RV PACING PULSEWIDTH: 0.4 ms
MDC IDC SET LEADCHNL RV SENSING SENSITIVITY: 0.3 mV
MDC IDC SET ZONE DETECTION INTERVAL: 320 ms
MDC IDC SET ZONE DETECTION INTERVAL: 360 ms
MDC IDC SET ZONE DETECTION INTERVAL: 360 ms
MDC IDC STAT BRADY RV PERCENT PACED: 0.01 %

## 2013-12-09 ENCOUNTER — Other Ambulatory Visit: Payer: Medicare Other

## 2013-12-09 ENCOUNTER — Ambulatory Visit: Payer: Medicare Other | Admitting: Physician Assistant

## 2013-12-10 ENCOUNTER — Other Ambulatory Visit (HOSPITAL_BASED_OUTPATIENT_CLINIC_OR_DEPARTMENT_OTHER): Payer: Medicare Other

## 2013-12-10 ENCOUNTER — Ambulatory Visit (HOSPITAL_BASED_OUTPATIENT_CLINIC_OR_DEPARTMENT_OTHER): Payer: Medicare Other | Admitting: Oncology

## 2013-12-10 ENCOUNTER — Other Ambulatory Visit: Payer: Self-pay | Admitting: Oncology

## 2013-12-10 ENCOUNTER — Telehealth: Payer: Self-pay | Admitting: Oncology

## 2013-12-10 ENCOUNTER — Telehealth: Payer: Self-pay | Admitting: *Deleted

## 2013-12-10 VITALS — BP 168/91 | HR 74 | Temp 97.9°F | Resp 18 | Ht 72.0 in | Wt 224.0 lb

## 2013-12-10 DIAGNOSIS — I5023 Acute on chronic systolic (congestive) heart failure: Secondary | ICD-10-CM

## 2013-12-10 DIAGNOSIS — D45 Polycythemia vera: Secondary | ICD-10-CM

## 2013-12-10 DIAGNOSIS — I2510 Atherosclerotic heart disease of native coronary artery without angina pectoris: Secondary | ICD-10-CM

## 2013-12-10 DIAGNOSIS — N179 Acute kidney failure, unspecified: Secondary | ICD-10-CM

## 2013-12-10 DIAGNOSIS — I5022 Chronic systolic (congestive) heart failure: Secondary | ICD-10-CM

## 2013-12-10 DIAGNOSIS — J189 Pneumonia, unspecified organism: Secondary | ICD-10-CM

## 2013-12-10 DIAGNOSIS — J9620 Acute and chronic respiratory failure, unspecified whether with hypoxia or hypercapnia: Secondary | ICD-10-CM

## 2013-12-10 DIAGNOSIS — Z86718 Personal history of other venous thrombosis and embolism: Secondary | ICD-10-CM

## 2013-12-10 LAB — COMPREHENSIVE METABOLIC PANEL (CC13)
ALBUMIN: 3.8 g/dL (ref 3.5–5.0)
ALK PHOS: 85 U/L (ref 40–150)
ALT: 12 U/L (ref 0–55)
AST: 12 U/L (ref 5–34)
Anion Gap: 9 mEq/L (ref 3–11)
BILIRUBIN TOTAL: 0.69 mg/dL (ref 0.20–1.20)
BUN: 20.5 mg/dL (ref 7.0–26.0)
CO2: 28 mEq/L (ref 22–29)
Calcium: 9.8 mg/dL (ref 8.4–10.4)
Chloride: 106 mEq/L (ref 98–109)
Creatinine: 1.3 mg/dL (ref 0.7–1.3)
Glucose: 108 mg/dl (ref 70–140)
POTASSIUM: 4.9 meq/L (ref 3.5–5.1)
SODIUM: 143 meq/L (ref 136–145)
TOTAL PROTEIN: 6.7 g/dL (ref 6.4–8.3)

## 2013-12-10 LAB — CBC WITH DIFFERENTIAL/PLATELET
BASO%: 0.8 % (ref 0.0–2.0)
Basophils Absolute: 0.1 10*3/uL (ref 0.0–0.1)
EOS ABS: 0.2 10*3/uL (ref 0.0–0.5)
EOS%: 2 % (ref 0.0–7.0)
HCT: 51.8 % — ABNORMAL HIGH (ref 38.4–49.9)
HGB: 16.6 g/dL (ref 13.0–17.1)
LYMPH%: 13.5 % — AB (ref 14.0–49.0)
MCH: 26.3 pg — ABNORMAL LOW (ref 27.2–33.4)
MCHC: 32.2 g/dL (ref 32.0–36.0)
MCV: 81.9 fL (ref 79.3–98.0)
MONO#: 0.8 10*3/uL (ref 0.1–0.9)
MONO%: 9.2 % (ref 0.0–14.0)
NEUT%: 74.5 % (ref 39.0–75.0)
NEUTROS ABS: 6.6 10*3/uL — AB (ref 1.5–6.5)
PLATELETS: 113 10*3/uL — AB (ref 140–400)
RBC: 6.32 10*6/uL — AB (ref 4.20–5.82)
RDW: 16.8 % — AB (ref 11.0–14.6)
WBC: 8.9 10*3/uL (ref 4.0–10.3)
lymph#: 1.2 10*3/uL (ref 0.9–3.3)

## 2013-12-10 MED ORDER — HYDROXYUREA 500 MG PO CAPS: 500.0000 mg | ORAL_CAPSULE | Freq: Every day | ORAL | Status: DC

## 2013-12-10 NOTE — Telephone Encounter (Signed)
, °

## 2013-12-10 NOTE — Progress Notes (Signed)
ID: Jesus Sanders   DOB: 09-04-1936  MR#: [id number]  WCH#:[id number]   PCP:  Leonard Downing, MD Other:  Peter Martinique, MD   HISTORY OF PRESENT ILLNESS:   Patient has a long-standing history of polycythemia vera, periodically requiring weekly therapeutic phlebotomy. This was diagnosed greater than 6 years ago. He also has multiple comorbidities which include COPD, multiple cardiac problems, history of DVT, and history of community-acquired pneumonia.   INTERVAL HISTORY:  Jesus Sanders returns today for followup of his polycythemia vera. The interval history is generally unremarkable. She has a defibrillator in place, but but it hasn't gone off"..  REVIEW OF SYSTEMS:  Jesus Sanders describes himself as mildly fatigued. He short of breath even at rest sometimes. He tells me the albuterol inhaler helps. Sometimes he feels palpitations. He denies unusual headaches, visual changes, dizziness, or falls. There has been no change in bowel or bladder habits. Is not aware of any peripheral edema. A detailed review of systems today was otherwise stable  PAST MEDICAL HISTORY: Past Medical History  Diagnosis Date  . Polycythemia vera(238.4)     a. Used to receive chronic phlebotomies until 2007, at regional [place].  will restart his phlebotomies from about Mar 15 2011  . Stomach ulcer   . Tobacco abuse     a. cigars  . Duodenal perforation June 2012  . Peritonitis June 2012  . Pneumonia April 2012  . Systolic CHF, chronic     a. 12/2011 Echo: EF 25-30%, mid-dist antsept/inf, apical AK, Gr 1 DD, Triv AI, Mild MR.  . Ischemic cardiomyopathy     a. 01/2012 S/P MDT Protecta single lead ICD, ser # [id number]  . History of DVT (deep vein thrombosis)   . Chronic bronchitis   . CAD (coronary artery disease)     a. 07/2011 Anterior apical STEMI/Cath/PCI: LM nl, LAD 100p/m (2.5 x 20mm Mini-Vision BMS), LCX 40p, RCA dominant, nl, EF 30%.  Marland Kitchen GERD (gastroesophageal reflux disease)   . Myocardial infarction    . Shortness of breath   . Asthma   . COPD (chronic obstructive pulmonary disease)     PAST SURGICAL HISTORY: Past Surgical History  Procedure Laterality Date  . Cholecystectomy  03/2011    Dr. Marlou Starks  . Colon surgery    . Cardiac catheterization  Sept 2012    Normal left main, occluded LAD, 40% LCX and normal RCA. EF is 30%  . US echocardiography  Sept 2012    EF 25 to 30% with akinesis of the mid to distal anterior and apical myocardium, trivial AI and no apical thrombus  . Cardiac defibrillator placement      single chamber  . Shoulder arthroscopy      left, rotatotor cuff tendinopathy    FAMILY HISTORY Family History  Problem Relation Age of Onset  . Lung disease Father     SOCIAL HISTORY: His niece works for a Office [employer] and she helps with his medications.   ADVANCED DIRECTIVES: Living will in place  HEALTH MAINTENANCE: History  Substance Use Topics  . Smoking status: Former Smoker -- 0.50 packs/day for 60 years    Types: Cigars    Quit date: 05/18/2013  . Smokeless tobacco: Current User    Types: Chew     Comment: Smoked .5-1ppd of cigarettes from age 49 to a few yrs ago and has been smoking 10 small cigars daily since then.  . Alcohol Use: Yes     Comment: rarely    No  Known Allergies  Current Outpatient Prescriptions  Medication Sig Dispense Refill  . albuterol (PROVENTIL HFA;VENTOLIN HFA) 108 (90 BASE) MCG/ACT inhaler Inhale 2 puffs into the lungs every 6 (six) hours as needed for wheezing or shortness of breath.  18 g  0  . aspirin EC 81 MG tablet Take 81 mg by mouth daily.      . carvedilol (COREG) 25 MG tablet Take 25 mg by mouth 2 (two) times daily with a meal.      . doxycycline (VIBRA-TABS) 100 MG tablet Take 1 tablet (100 mg total) by mouth every 12 (twelve) hours.  3 tablet  0  . furosemide (LASIX) 40 MG tablet Take 1 tablet (40 mg total) by mouth daily.  30 tablet  0  . ipratropium-albuterol (DUONEB) 0.5-2.5 (3) MG/3ML SOLN Take 3 mLs by  nebulization every 6 (six) hours as needed (Wheezing or shortness of breath.).  360 mL  0  . lisinopril (PRINIVIL,ZESTRIL) 10 MG tablet Take 1 tablet (10 mg total) by mouth daily.  30 tablet  0  . lovastatin (MEVACOR) 20 MG tablet Take 20 mg by mouth at bedtime.      Marland Kitchen omeprazole (PRILOSEC) 20 MG capsule Take 20 mg by mouth daily.      . predniSONE (DELTASONE) 10 MG tablet Take 4 tablets daily x2 days, then 3 tablets daily x2 days, then 2 tablets daily x2 days, then 1 tablet daily x2 days, then stop.  20 tablet  0   No current facility-administered medications for this visit.    OBJECTIVE: Elderly white man who appears stated age 78 Vitals:   12/10/13 1016  BP: 168/91  Pulse: 74  Temp: 97.9 F (36.6 C)  Resp: 18     Body mass index is 30.37 kg/(m^2).    ECOG FS: 1  Filed Weights   12/10/13 1016  Weight: 224 lb (101.606 kg)   Sclerae unicteric, pupils equal and round Oropharynx clear and moist No cervical or supraclavicular adenopathy Lungs no rales or rhonchi Heart regular rate and rhythm Abd soft, nontender, positive bowel sounds MSK no focal spinal tenderness, no upper extremity lymphedema Neuro: nonfocal,  appropriate affect       LAB RESULTS: Lab Results  Component Value Date   WBC 8.9 12/10/2013   NEUTROABS 6.6* 12/10/2013   HGB 16.6 12/10/2013   HCT 51.8* 12/10/2013   MCV 81.9 12/10/2013   PLT 113* 12/10/2013      Chemistry      Component Value Date/Time   NA 135 05/18/2013 0513   K 4.8 05/18/2013 0513   CL 98 05/18/2013 0513   CO2 27 05/18/2013 0513   BUN 50* 05/18/2013 0513   CREATININE 1.34 05/18/2013 0513   CREATININE 1.57* 10/24/2012 1327      Component Value Date/Time   CALCIUM 9.6 05/18/2013 0513   ALKPHOS 81 05/15/2013 0526   AST 14 05/15/2013 0526   ALT 14 05/15/2013 0526   BILITOT 0.2* 05/15/2013 0526        STUDIES: No results found.   ASSESSMENT: 78 y.o. [place] man with   (1)  a history of polycythemia vera treated with phlebotomy.  The goal  is to phlebotomize once Jesus Sanders hematocrit is 50 or greater.  At that point, we phlebotomize until the hematocrit is at 40 or less.    (2)  Several comorbidities including community-acquired pneumonia involving the right lower lobe, history of recurrent deep vein thrombosis, history of tobacco abuse with chronic bronchitis, history of CVA  and more recently history of STEMI and CHF, s/p defibrillator placement   (3) hydroxyurea started February 2015  PLAN: Jesus Sanders hemoglobin is nearly 59 and we are ready to go back on phlebotomy. I discussed Hydrea again with him today. This time he seems agreeable to taking it, so I went ahead and put in a prescription for just one tablet a day. Hopefully that will help in the long run.  He will be phlebotomized weekly starting 12/11/2013 until his hematocrit is less than 40. At that time we will resume routine followup.  He has a good understanding of the overall plan. He agrees with that. He knows to call for any problems that may develop before his next visit here, which will be in 3 months.   Jesus Sanders C    12/10/2013

## 2013-12-10 NOTE — Telephone Encounter (Signed)
Per staff message and POF I have scheduled appts.  JMW  

## 2013-12-11 ENCOUNTER — Encounter: Payer: Self-pay | Admitting: *Deleted

## 2013-12-11 ENCOUNTER — Telehealth: Payer: Self-pay | Admitting: Oncology

## 2013-12-11 ENCOUNTER — Ambulatory Visit (HOSPITAL_BASED_OUTPATIENT_CLINIC_OR_DEPARTMENT_OTHER): Payer: Medicare Other

## 2013-12-11 ENCOUNTER — Other Ambulatory Visit: Payer: Self-pay | Admitting: Oncology

## 2013-12-11 VITALS — BP 149/72 | HR 71 | Temp 97.0°F | Resp 18

## 2013-12-11 DIAGNOSIS — D45 Polycythemia vera: Secondary | ICD-10-CM

## 2013-12-11 NOTE — Patient Instructions (Signed)

## 2013-12-11 NOTE — Progress Notes (Signed)
Patient tolerated phlebotomy without any problems. Right antecubital used - only required 1 stick for entire phlebotomy. Patient observed 30 minutes after and VS stable. Patient discharged home. Cindi Carbon, RN

## 2013-12-11 NOTE — Telephone Encounter (Signed)
, °

## 2013-12-17 ENCOUNTER — Encounter: Payer: Self-pay | Admitting: Internal Medicine

## 2013-12-18 ENCOUNTER — Other Ambulatory Visit (HOSPITAL_BASED_OUTPATIENT_CLINIC_OR_DEPARTMENT_OTHER): Payer: Medicare Other

## 2013-12-18 ENCOUNTER — Ambulatory Visit (HOSPITAL_BASED_OUTPATIENT_CLINIC_OR_DEPARTMENT_OTHER): Payer: Medicare Other

## 2013-12-18 VITALS — BP 134/86 | HR 85 | Temp 97.3°F | Resp 18

## 2013-12-18 DIAGNOSIS — D45 Polycythemia vera: Secondary | ICD-10-CM

## 2013-12-18 LAB — CBC WITH DIFFERENTIAL/PLATELET
BASO%: 0.3 % (ref 0.0–2.0)
Basophils Absolute: 0 10*3/uL (ref 0.0–0.1)
EOS%: 1.5 % (ref 0.0–7.0)
Eosinophils Absolute: 0.2 10*3/uL (ref 0.0–0.5)
HEMATOCRIT: 51 % — AB (ref 38.4–49.9)
HGB: 16.2 g/dL (ref 13.0–17.1)
LYMPH%: 7.7 % — AB (ref 14.0–49.0)
MCH: 26 pg — ABNORMAL LOW (ref 27.2–33.4)
MCHC: 31.8 g/dL — AB (ref 32.0–36.0)
MCV: 81.9 fL (ref 79.3–98.0)
MONO#: 1.2 10*3/uL — AB (ref 0.1–0.9)
MONO%: 11.3 % (ref 0.0–14.0)
NEUT#: 8.3 10*3/uL — ABNORMAL HIGH (ref 1.5–6.5)
NEUT%: 79.2 % — AB (ref 39.0–75.0)
PLATELETS: 115 10*3/uL — AB (ref 140–400)
RBC: 6.23 10*6/uL — AB (ref 4.20–5.82)
RDW: 16.6 % — ABNORMAL HIGH (ref 11.0–14.6)
WBC: 10.5 10*3/uL — AB (ref 4.0–10.3)
lymph#: 0.8 10*3/uL — ABNORMAL LOW (ref 0.9–3.3)

## 2013-12-18 NOTE — Progress Notes (Signed)
Phlebotomy performed using a 16 gauge phlebotomy kit. Patient tolerated well. Nourishment provided.

## 2013-12-18 NOTE — Patient Instructions (Signed)
Therapeutic Phlebotomy Therapeutic phlebotomy is the controlled removal of blood from your body for the purpose of treating a medical condition. It is similar to donating blood. Usually, about a pint (470 mL) of blood is removed. The average adult has 9 to 12 pints (4.3 to 5.7 L) of blood. Therapeutic phlebotomy may be used to treat the following medical conditions:  Hemochromatosis. This is a condition in which there is too much iron in the blood.  Polycythemia vera. This is a condition in which there are too many red cells in the blood.  Porphyria cutanea tarda. This is a disease usually passed from one generation to the next (inherited). It is a condition in which an important part of hemoglobin is not made properly. This results in the build up of abnormal amounts of porphyrins in the body.  Sickle cell disease. This is an inherited disease. It is a condition in which the red blood cells form an abnormal crescent shape rather than a round shape. LET YOUR CAREGIVER KNOW ABOUT:  Allergies.  Medicines taken including herbs, eyedrops, over-the-counter medicines, and creams.  Use of steroids (by mouth or creams).  Previous problems with anesthetics or numbing medicine.  History of blood clots.  History of bleeding or blood problems.  Previous surgery.  Possibility of pregnancy, if this applies. RISKS AND COMPLICATIONS This is a simple and safe procedure. Problems are unlikely. However, problems can occur and may include:  Nausea or lightheadedness.  Low blood pressure.  Soreness, bleeding, swelling, or bruising at the needle insertion site.  Infection. BEFORE THE PROCEDURE  This is a procedure that can be done as an outpatient. Confirm the time that you need to arrive for your procedure. Confirm whether there is a need to fast or withhold any medications. It is helpful to wear clothing with sleeves that can be raised above the elbow. A blood sample may be done to determine the  amount of red blood cells or iron in your blood. Plan ahead of time to have someone drive you home after the procedure. PROCEDURE The entire procedure from preparation through recovery takes about 1 hour. The actual collection takes about 10 to 15 minutes.  A needle will be inserted into your vein.  Tubing and a collection bag will be attached to that needle.  Blood will flow through the needle and tubing into the collection bag.  You may be asked to open and close your hand slowly and continuously during the entire collection.  Once the specified amount of blood has been removed from your body, the collection bag and tubing will be clamped.  The needle will be removed.  Pressure will be held on the site of the needle insertion to stop the bleeding. Then a bandage will be placed over the needle insertion site. AFTER THE PROCEDURE  Your recovery will be assessed and monitored. If there are no problems, as an outpatient, you should be able to go home shortly after the procedure.  Document Released: 03/28/2011 Document Revised: 01/16/2012 Document Reviewed: 03/28/2011 Exit[employer] Patient Information 2014 [employer].  

## 2013-12-25 ENCOUNTER — Other Ambulatory Visit: Payer: Medicare Other

## 2014-01-01 ENCOUNTER — Other Ambulatory Visit: Payer: Medicare Other

## 2014-01-08 ENCOUNTER — Other Ambulatory Visit: Payer: Medicare Other

## 2014-01-15 ENCOUNTER — Ambulatory Visit (HOSPITAL_BASED_OUTPATIENT_CLINIC_OR_DEPARTMENT_OTHER): Payer: Medicare Other

## 2014-01-15 ENCOUNTER — Other Ambulatory Visit (HOSPITAL_BASED_OUTPATIENT_CLINIC_OR_DEPARTMENT_OTHER): Payer: Medicare Other

## 2014-01-15 VITALS — BP 142/69 | HR 78 | Temp 97.4°F | Resp 18

## 2014-01-15 DIAGNOSIS — D45 Polycythemia vera: Secondary | ICD-10-CM

## 2014-01-15 LAB — CBC WITH DIFFERENTIAL/PLATELET
BASO%: 0.5 % (ref 0.0–2.0)
Basophils Absolute: 0 10*3/uL (ref 0.0–0.1)
EOS%: 4 % (ref 0.0–7.0)
Eosinophils Absolute: 0.3 10*3/uL (ref 0.0–0.5)
HEMATOCRIT: 47.2 % (ref 38.4–49.9)
HGB: 14.7 g/dL (ref 13.0–17.1)
LYMPH#: 1.5 10*3/uL (ref 0.9–3.3)
LYMPH%: 22.7 % (ref 14.0–49.0)
MCH: 25.8 pg — AB (ref 27.2–33.4)
MCHC: 31.1 g/dL — AB (ref 32.0–36.0)
MCV: 83 fL (ref 79.3–98.0)
MONO#: 0.9 10*3/uL (ref 0.1–0.9)
MONO%: 13.6 % (ref 0.0–14.0)
NEUT#: 3.8 10*3/uL (ref 1.5–6.5)
NEUT%: 59.2 % (ref 39.0–75.0)
NRBC: 0 % (ref 0–0)
PLATELETS: 103 10*3/uL — AB (ref 140–400)
RBC: 5.69 10*6/uL (ref 4.20–5.82)
RDW: 17.4 % — ABNORMAL HIGH (ref 11.0–14.6)
WBC: 6.5 10*3/uL (ref 4.0–10.3)

## 2014-01-15 NOTE — Patient Instructions (Signed)

## 2014-01-22 ENCOUNTER — Other Ambulatory Visit (HOSPITAL_BASED_OUTPATIENT_CLINIC_OR_DEPARTMENT_OTHER): Payer: Medicare Other

## 2014-01-22 ENCOUNTER — Ambulatory Visit: Payer: Medicare Other

## 2014-01-22 DIAGNOSIS — D45 Polycythemia vera: Secondary | ICD-10-CM

## 2014-01-22 LAB — CBC WITH DIFFERENTIAL/PLATELET
BASO%: 0.3 % (ref 0.0–2.0)
Basophils Absolute: 0 10*3/uL (ref 0.0–0.1)
EOS%: 3 % (ref 0.0–7.0)
Eosinophils Absolute: 0.2 10*3/uL (ref 0.0–0.5)
HCT: 43.4 % (ref 38.4–49.9)
HGB: 13.4 g/dL (ref 13.0–17.1)
LYMPH#: 1.5 10*3/uL (ref 0.9–3.3)
LYMPH%: 20.2 % (ref 14.0–49.0)
MCH: 25.9 pg — AB (ref 27.2–33.4)
MCHC: 30.9 g/dL — ABNORMAL LOW (ref 32.0–36.0)
MCV: 83.8 fL (ref 79.3–98.0)
MONO#: 0.8 10*3/uL (ref 0.1–0.9)
MONO%: 11.3 % (ref 0.0–14.0)
NEUT#: 4.8 10*3/uL (ref 1.5–6.5)
NEUT%: 65.2 % (ref 39.0–75.0)
NRBC: 0 % (ref 0–0)
Platelets: 119 10*3/uL — ABNORMAL LOW (ref 140–400)
RBC: 5.18 10*6/uL (ref 4.20–5.82)
RDW: 17.7 % — AB (ref 11.0–14.6)
WBC: 7.3 10*3/uL (ref 4.0–10.3)

## 2014-01-22 NOTE — Progress Notes (Signed)
Phlebotomy 1 unit, patient tolerated well, refreshments given, instructed to eat a good lunch, hydrate well. If dizzy sit down, if it persists, call m.d. Patient verbalizes understanding.

## 2014-01-29 ENCOUNTER — Ambulatory Visit (HOSPITAL_BASED_OUTPATIENT_CLINIC_OR_DEPARTMENT_OTHER): Payer: Medicare Other

## 2014-01-29 ENCOUNTER — Other Ambulatory Visit (HOSPITAL_BASED_OUTPATIENT_CLINIC_OR_DEPARTMENT_OTHER): Payer: Medicare Other

## 2014-01-29 VITALS — BP 114/71 | HR 74 | Temp 97.8°F | Resp 18 | Ht 72.0 in

## 2014-01-29 DIAGNOSIS — D45 Polycythemia vera: Secondary | ICD-10-CM

## 2014-01-29 LAB — CBC WITH DIFFERENTIAL/PLATELET
BASO%: 0.5 % (ref 0.0–2.0)
Basophils Absolute: 0 10*3/uL (ref 0.0–0.1)
EOS%: 2.4 % (ref 0.0–7.0)
Eosinophils Absolute: 0.2 10*3/uL (ref 0.0–0.5)
HCT: 43.5 % (ref 38.4–49.9)
HGB: 13.4 g/dL (ref 13.0–17.1)
LYMPH#: 1.5 10*3/uL (ref 0.9–3.3)
LYMPH%: 18.1 % (ref 14.0–49.0)
MCH: 25.6 pg — ABNORMAL LOW (ref 27.2–33.4)
MCHC: 30.8 g/dL — AB (ref 32.0–36.0)
MCV: 83 fL (ref 79.3–98.0)
MONO#: 0.9 10*3/uL (ref 0.1–0.9)
MONO%: 10.5 % (ref 0.0–14.0)
NEUT#: 5.5 10*3/uL (ref 1.5–6.5)
NEUT%: 68.5 % (ref 39.0–75.0)
NRBC: 0 % (ref 0–0)
PLATELETS: 160 10*3/uL (ref 140–400)
RBC: 5.24 10*6/uL (ref 4.20–5.82)
RDW: 17.7 % — AB (ref 11.0–14.6)
WBC: 8.1 10*3/uL (ref 4.0–10.3)

## 2014-01-29 NOTE — Progress Notes (Signed)
Patient ate breakfast here before procedure.  Phlebotomy performed with 4 attempts, first phlebotomy performed to right [facility], patient moved as I was taping needle, and needle came out, approximately obtained 100 units.  Attempted 2nd try to lower right  forearm, slow to drain, finally clotted. Tanya RN attempted to right AC, flashback noted but would not flow. Bonnita Nasuti RN able to use phlebotomy 18G needle set to left [facility] without difficulty. 400 units removed. Patient tolerated well, snacks provided afterwards with 30 min observation.

## 2014-01-29 NOTE — Patient Instructions (Signed)

## 2014-02-05 ENCOUNTER — Other Ambulatory Visit: Payer: Medicare Other

## 2014-02-12 ENCOUNTER — Other Ambulatory Visit: Payer: Medicare Other

## 2014-02-12 ENCOUNTER — Other Ambulatory Visit: Payer: Self-pay

## 2014-02-12 ENCOUNTER — Ambulatory Visit: Payer: Medicare Other

## 2014-02-12 ENCOUNTER — Other Ambulatory Visit: Payer: Self-pay | Admitting: *Deleted

## 2014-02-12 DIAGNOSIS — D45 Polycythemia vera: Secondary | ICD-10-CM

## 2014-02-12 LAB — CBC WITH DIFFERENTIAL/PLATELET
BASO%: 0.4 % (ref 0.0–2.0)
BASOS ABS: 0 10*3/uL (ref 0.0–0.1)
EOS%: 2.5 % (ref 0.0–7.0)
Eosinophils Absolute: 0.2 10*3/uL (ref 0.0–0.5)
HCT: 38.9 % (ref 38.4–49.9)
HEMOGLOBIN: 12.1 g/dL — AB (ref 13.0–17.1)
LYMPH#: 1.6 10*3/uL (ref 0.9–3.3)
LYMPH%: 19.9 % (ref 14.0–49.0)
MCH: 25.6 pg — AB (ref 27.2–33.4)
MCHC: 31.1 g/dL — ABNORMAL LOW (ref 32.0–36.0)
MCV: 82.4 fL (ref 79.3–98.0)
MONO#: 0.8 10*3/uL (ref 0.1–0.9)
MONO%: 10 % (ref 0.0–14.0)
NEUT#: 5.4 10*3/uL (ref 1.5–6.5)
NEUT%: 67.2 % (ref 39.0–75.0)
Platelets: 108 10*3/uL — ABNORMAL LOW (ref 140–400)
RBC: 4.72 10*6/uL (ref 4.20–5.82)
RDW: 17.2 % — AB (ref 11.0–14.6)
WBC: 8.1 10*3/uL (ref 4.0–10.3)
nRBC: 0 % (ref 0–0)

## 2014-02-12 NOTE — Progress Notes (Signed)
HCT today 38.9.  No phlebotomy necessary per office visit note.  Spoke to patient in lobby, told to return in one week for lab check.  Pt verbalized understanding.

## 2014-02-19 ENCOUNTER — Other Ambulatory Visit: Payer: Medicare Other

## 2014-02-21 ENCOUNTER — Encounter (HOSPITAL_COMMUNITY): Payer: Self-pay | Admitting: Emergency Medicine

## 2014-02-21 ENCOUNTER — Emergency Department (HOSPITAL_COMMUNITY): Payer: Medicare Other

## 2014-02-21 ENCOUNTER — Ambulatory Visit (INDEPENDENT_AMBULATORY_CARE_PROVIDER_SITE_OTHER): Payer: Medicare Other | Admitting: *Deleted

## 2014-02-21 ENCOUNTER — Inpatient Hospital Stay (HOSPITAL_COMMUNITY)
Admission: EM | Admit: 2014-02-21 | Discharge: 2014-02-23 | DRG: 190 | Disposition: A | Discharge status: Home / Self Care | Payer: Medicare Other | Attending: Family Medicine | Admitting: Family Medicine

## 2014-02-21 DIAGNOSIS — I255 Ischemic cardiomyopathy: Secondary | ICD-10-CM | POA: Diagnosis present

## 2014-02-21 DIAGNOSIS — I2510 Atherosclerotic heart disease of native coronary artery without angina pectoris: Secondary | ICD-10-CM | POA: Diagnosis present

## 2014-02-21 DIAGNOSIS — R0989 Other specified symptoms and signs involving the circulatory and respiratory systems: Secondary | ICD-10-CM

## 2014-02-21 DIAGNOSIS — I2589 Other forms of chronic ischemic heart disease: Secondary | ICD-10-CM

## 2014-02-21 DIAGNOSIS — R0609 Other forms of dyspnea: Secondary | ICD-10-CM

## 2014-02-21 DIAGNOSIS — I4460 Unspecified fascicular block: Secondary | ICD-10-CM | POA: Diagnosis present

## 2014-02-21 DIAGNOSIS — I509 Heart failure, unspecified: Secondary | ICD-10-CM | POA: Diagnosis present

## 2014-02-21 DIAGNOSIS — J441 Chronic obstructive pulmonary disease with (acute) exacerbation: Principal | ICD-10-CM | POA: Diagnosis present

## 2014-02-21 DIAGNOSIS — Z86718 Personal history of other venous thrombosis and embolism: Secondary | ICD-10-CM

## 2014-02-21 DIAGNOSIS — I10 Essential (primary) hypertension: Secondary | ICD-10-CM | POA: Diagnosis present

## 2014-02-21 DIAGNOSIS — J45901 Unspecified asthma with (acute) exacerbation: Principal | ICD-10-CM

## 2014-02-21 DIAGNOSIS — I5022 Chronic systolic (congestive) heart failure: Secondary | ICD-10-CM | POA: Diagnosis present

## 2014-02-21 DIAGNOSIS — R0600 Dyspnea, unspecified: Secondary | ICD-10-CM

## 2014-02-21 DIAGNOSIS — Z8673 Personal history of transient ischemic attack (TIA), and cerebral infarction without residual deficits: Secondary | ICD-10-CM

## 2014-02-21 DIAGNOSIS — E785 Hyperlipidemia, unspecified: Secondary | ICD-10-CM | POA: Diagnosis present

## 2014-02-21 DIAGNOSIS — J9620 Acute and chronic respiratory failure, unspecified whether with hypoxia or hypercapnia: Secondary | ICD-10-CM | POA: Diagnosis present

## 2014-02-21 DIAGNOSIS — Z79899 Other long term (current) drug therapy: Secondary | ICD-10-CM

## 2014-02-21 DIAGNOSIS — I5023 Acute on chronic systolic (congestive) heart failure: Secondary | ICD-10-CM

## 2014-02-21 DIAGNOSIS — Z7982 Long term (current) use of aspirin: Secondary | ICD-10-CM

## 2014-02-21 DIAGNOSIS — D45 Polycythemia vera: Secondary | ICD-10-CM | POA: Diagnosis present

## 2014-02-21 DIAGNOSIS — Z87891 Personal history of nicotine dependence: Secondary | ICD-10-CM

## 2014-02-21 DIAGNOSIS — Z66 Do not resuscitate: Secondary | ICD-10-CM | POA: Diagnosis present

## 2014-02-21 DIAGNOSIS — Z95810 Presence of automatic (implantable) cardiac defibrillator: Secondary | ICD-10-CM

## 2014-02-21 DIAGNOSIS — N179 Acute kidney failure, unspecified: Secondary | ICD-10-CM | POA: Diagnosis present

## 2014-02-21 DIAGNOSIS — I252 Old myocardial infarction: Secondary | ICD-10-CM

## 2014-02-21 DIAGNOSIS — K219 Gastro-esophageal reflux disease without esophagitis: Secondary | ICD-10-CM | POA: Diagnosis present

## 2014-02-21 LAB — CBC WITH DIFFERENTIAL/PLATELET
BASOS ABS: 0 10*3/uL (ref 0.0–0.1)
Basophils Relative: 0 % (ref 0–1)
EOS ABS: 0.2 10*3/uL (ref 0.0–0.7)
Eosinophils Relative: 2 % (ref 0–5)
HCT: 37.9 % — ABNORMAL LOW (ref 39.0–52.0)
HEMOGLOBIN: 11.6 g/dL — AB (ref 13.0–17.0)
Lymphocytes Relative: 16 % (ref 12–46)
Lymphs Abs: 1.2 10*3/uL (ref 0.7–4.0)
MCH: 25.5 pg — AB (ref 26.0–34.0)
MCHC: 30.6 g/dL (ref 30.0–36.0)
MCV: 83.3 fL (ref 78.0–100.0)
MONOS PCT: 8 % (ref 3–12)
Monocytes Absolute: 0.6 10*3/uL (ref 0.1–1.0)
NEUTROS PCT: 73 % (ref 43–77)
Neutro Abs: 5.4 10*3/uL (ref 1.7–7.7)
Platelets: 188 10*3/uL (ref 150–400)
RBC: 4.55 MIL/uL (ref 4.22–5.81)
RDW: 17 % — AB (ref 11.5–15.5)
WBC: 7.4 10*3/uL (ref 4.0–10.5)

## 2014-02-21 LAB — COMPREHENSIVE METABOLIC PANEL
ALBUMIN: 3.3 g/dL — AB (ref 3.5–5.2)
ALT: 7 U/L (ref 0–53)
AST: 10 U/L (ref 0–37)
Alkaline Phosphatase: 66 U/L (ref 39–117)
BUN: 25 mg/dL — AB (ref 6–23)
CO2: 29 mEq/L (ref 19–32)
CREATININE: 1.24 mg/dL (ref 0.50–1.35)
Calcium: 9.7 mg/dL (ref 8.4–10.5)
Chloride: 104 mEq/L (ref 96–112)
GFR calc Af Amer: 64 mL/min — ABNORMAL LOW (ref >90)
GFR calc non Af Amer: 55 mL/min — ABNORMAL LOW (ref >90)
Glucose, Bld: 85 mg/dL (ref 70–99)
Potassium: 4.4 mEq/L (ref 3.7–5.3)
Sodium: 141 mEq/L (ref 137–147)
Total Bilirubin: 0.4 mg/dL (ref 0.3–1.2)
Total Protein: 6.3 g/dL (ref 6.0–8.3)

## 2014-02-21 LAB — MDC_IDC_ENUM_SESS_TYPE_REMOTE
HighPow Impedance: 69 Ohm
Lead Channel Impedance Value: 589 Ohm
Lead Channel Pacing Threshold Amplitude: 0.375 V
Lead Channel Pacing Threshold Pulse Width: 0.4 ms
Lead Channel Sensing Intrinsic Amplitude: 5.9 mV
Lead Channel Setting Pacing Amplitude: 2.5 V
Lead Channel Setting Pacing Pulse Width: 0.4 ms
MDC IDC MSMT BATTERY VOLTAGE: 3.13 V
MDC IDC SET LEADCHNL RV SENSING SENSITIVITY: 0.3 mV
MDC IDC SET ZONE DETECTION INTERVAL: 360 ms
MDC IDC STAT BRADY RV PERCENT PACED: 0.1 % — AB
Zone Setting Detection Interval: 320 ms
Zone Setting Detection Interval: 360 ms

## 2014-02-21 LAB — TROPONIN I

## 2014-02-21 LAB — PRO B NATRIURETIC PEPTIDE: PRO B NATRI PEPTIDE: 539.4 pg/mL — AB (ref 0–450)

## 2014-02-21 LAB — D-DIMER, QUANTITATIVE (NOT AT ARMC): D DIMER QUANT: 3.39 ug{FEU}/mL — AB (ref 0.00–0.48)

## 2014-02-21 MED ORDER — IPRATROPIUM BROMIDE HFA 17 MCG/ACT IN AERS: 2.0000 | INHALATION_SPRAY | RESPIRATORY_TRACT | Status: DC

## 2014-02-21 MED ORDER — FUROSEMIDE 20 MG PO TABS
20.0000 mg | ORAL_TABLET | Freq: Every day | ORAL | Status: DC
  Administered 2014-02-22: 20 mg via ORAL
  Filled 2014-02-21: qty 1

## 2014-02-21 MED ORDER — PREDNISONE 20 MG PO TABS
60.0000 mg | ORAL_TABLET | Freq: Once | ORAL | Status: AC
  Administered 2014-02-21: 60 mg via ORAL
  Filled 2014-02-21: qty 3

## 2014-02-21 MED ORDER — SODIUM CHLORIDE 0.9 % IJ SOLN
3.0000 mL | Freq: Two times a day (BID) | INTRAMUSCULAR | Status: DC
  Administered 2014-02-21 – 2014-02-23 (×4): 3 mL via INTRAVENOUS

## 2014-02-21 MED ORDER — IOHEXOL 350 MG/ML SOLN
100.0000 mL | Freq: Once | INTRAVENOUS | Status: AC | PRN
  Administered 2014-02-21: 100 mL via INTRAVENOUS

## 2014-02-21 MED ORDER — LISINOPRIL 10 MG PO TABS
10.0000 mg | ORAL_TABLET | Freq: Every day | ORAL | Status: DC
  Administered 2014-02-22: 10 mg via ORAL
  Filled 2014-02-21: qty 1

## 2014-02-21 MED ORDER — ALBUTEROL SULFATE (2.5 MG/3ML) 0.083% IN NEBU
5.0000 mg | INHALATION_SOLUTION | Freq: Once | RESPIRATORY_TRACT | Status: AC
  Administered 2014-02-21: 5 mg via RESPIRATORY_TRACT
  Filled 2014-02-21: qty 6

## 2014-02-21 MED ORDER — HYDROXYUREA 500 MG PO CAPS
500.0000 mg | ORAL_CAPSULE | Freq: Every day | ORAL | Status: DC
Start: 2014-02-22 — End: 2014-02-23
  Administered 2014-02-22 – 2014-02-23 (×2): 500 mg via ORAL
  Filled 2014-02-21 (×3): qty 1

## 2014-02-21 MED ORDER — HEPARIN SODIUM (PORCINE) 5000 UNIT/ML IJ SOLN
5000.0000 [IU] | Freq: Three times a day (TID) | INTRAMUSCULAR | Status: DC
  Administered 2014-02-21 – 2014-02-23 (×5): 5000 [IU] via SUBCUTANEOUS
  Filled 2014-02-21 (×8): qty 1

## 2014-02-21 MED ORDER — LISINOPRIL 10 MG PO TABS
10.0000 mg | ORAL_TABLET | Freq: Every day | ORAL | Status: DC
  Filled 2014-02-21: qty 1

## 2014-02-21 MED ORDER — IPRATROPIUM BROMIDE 0.02 % IN SOLN
0.5000 mg | RESPIRATORY_TRACT | Status: DC
  Administered 2014-02-21 – 2014-02-22 (×7): 0.5 mg via RESPIRATORY_TRACT
  Filled 2014-02-21 (×7): qty 2.5

## 2014-02-21 MED ORDER — ALBUTEROL SULFATE HFA 108 (90 BASE) MCG/ACT IN AERS: 2.0000 | INHALATION_SPRAY | Freq: Four times a day (QID) | RESPIRATORY_TRACT | Status: DC | PRN

## 2014-02-21 MED ORDER — CARVEDILOL 25 MG PO TABS
25.0000 mg | ORAL_TABLET | Freq: Two times a day (BID) | ORAL | Status: DC
  Administered 2014-02-22 – 2014-02-23 (×3): 25 mg via ORAL
  Filled 2014-02-21 (×5): qty 1

## 2014-02-21 MED ORDER — ALBUTEROL (5 MG/ML) CONTINUOUS INHALATION SOLN
10.0000 mg/h | INHALATION_SOLUTION | RESPIRATORY_TRACT | Status: AC
  Administered 2014-02-21: 10 mg/h via RESPIRATORY_TRACT
  Filled 2014-02-21: qty 40
  Filled 2014-02-21: qty 20

## 2014-02-21 MED ORDER — IPRATROPIUM BROMIDE 0.02 % IN SOLN
1.0000 mg | Freq: Once | RESPIRATORY_TRACT | Status: AC
  Administered 2014-02-21: 1 mg via RESPIRATORY_TRACT
  Filled 2014-02-21: qty 5

## 2014-02-21 MED ORDER — MOMETASONE FURO-FORMOTEROL FUM 100-5 MCG/ACT IN AERO
2.0000 | INHALATION_SPRAY | Freq: Two times a day (BID) | RESPIRATORY_TRACT | Status: DC
  Administered 2014-02-21 – 2014-02-23 (×4): 2 via RESPIRATORY_TRACT
  Filled 2014-02-21: qty 8.8

## 2014-02-21 MED ORDER — PREDNISONE 50 MG PO TABS
60.0000 mg | ORAL_TABLET | Freq: Every day | ORAL | Status: DC
Start: 2014-02-22 — End: 2014-02-23
  Administered 2014-02-22 – 2014-02-23 (×2): 60 mg via ORAL
  Filled 2014-02-21 (×3): qty 1

## 2014-02-21 MED ORDER — PANTOPRAZOLE SODIUM 40 MG PO TBEC
40.0000 mg | DELAYED_RELEASE_TABLET | Freq: Every day | ORAL | Status: DC
  Administered 2014-02-21 – 2014-02-23 (×3): 40 mg via ORAL
  Filled 2014-02-21 (×3): qty 1

## 2014-02-21 MED ORDER — ASPIRIN EC 81 MG PO TBEC
81.0000 mg | DELAYED_RELEASE_TABLET | Freq: Every day | ORAL | Status: DC
  Administered 2014-02-21 – 2014-02-23 (×3): 81 mg via ORAL
  Filled 2014-02-21 (×3): qty 1

## 2014-02-21 MED ORDER — ALBUTEROL SULFATE (2.5 MG/3ML) 0.083% IN NEBU: 2.5000 mg | INHALATION_SOLUTION | RESPIRATORY_TRACT | Status: DC | PRN

## 2014-02-21 NOTE — ED Notes (Signed)
Pt used urinal at bedside

## 2014-02-21 NOTE — Progress Notes (Signed)
Called for report, was told the nurse is in a room and will call me back.

## 2014-02-21 NOTE — H&P (Signed)
[facility] History and Physical  RUFORD DUDZINSKI YSA:[id number] DOB: 03/20/1936 DOA: 02/21/2014  Referring physician: ED PCP: Leonard Downing, MD  Specialists: None    Chief Complaint: shortness of breath   HPI: Jesus Sanders is a 78 y.o. male came to [facility] ed 02/21/2014 with shortness of breath. No history of polycythemia vera, CAD history 07/2011 anterior apical MI S./P. BMS to LAD resulting in ischemic cardiomyopathy with systolic CHF [systolic 32%] s/p defibrillator 01/2012, severe COPD FEV1/FVC 0.40,  H/o DVt in 2001 and [place]. He was doing well until this morning 4 AM when he became acutely more short of breath than usual. He states that at baseline he sleeps with one pillow, and has been comfortable doing that takes his Lasix regularly. He states that sleep in general is disturbed most meds and he is not able to sleep more than hour and a half without getting up feeling short winded. At baseline he is relatively sedentary and small distances she just turned 15 feet of walking her. He does not use home oxygen. He has not been around anyone sick lately has had no fever had no chest pain had no nausea no vomiting had no lower extremity swelling tightness of his clothes or dietary indiscretions with excess salt in his diet. He also denies any urinary symptoms any diarrhea any nausea any dark stool is He's also had no other contact with anyone who is been ill or any long distance travel. He tried to take his inhaler as well as his nebulizer at home but this did not help him. On review of his medications his only on albuterol monotherapy which is unusual given that he has severe COPD. He manages his medications himself and his daughter lives next door and does try to help out.  Emergency room workup revealed BUN 25, creatinine 1.24, proBNP 539, hemoglobin 11.6, platelets 180, D. dimer was elevated 3.29 prompting CT scan chest which showed an asymmetric changes with evidence of  asbestos exposure, elliptical shaped lesion right main artery? Extrinsic compression.  EKG on admission revealed a sinus rhythm QRS axis left anterior fascicular block minus 35 all changes evident in precordial leads with flipped T. phase throughout. No acute ST-T wave changes PR interval 0.20 QRS duration 0.20 no evidence  ischemia  Past Medical History  Diagnosis Date  . Polycythemia vera(238.4)     a. Used to receive chronic phlebotomies until 2007, at regional [place].  will restart his phlebotomies from about Mar 15 2011  . Stomach ulcer   . Tobacco abuse     a. cigars  . Duodenal perforation June 2012  . Peritonitis June 2012  . Pneumonia April 2012  . Systolic CHF, chronic     a. 12/2011 Echo: EF 25-30%, mid-dist antsept/inf, apical AK, Gr 1 DD, Triv AI, Mild MR.  . Ischemic cardiomyopathy     a. 01/2012 S/P MDT Protecta single lead ICD, ser # [id number] H  . History of DVT (deep vein thrombosis)   . Chronic bronchitis   . CAD (coronary artery disease)     a. 07/2011 Anterior apical STEMI/Cath/PCI: LM nl, LAD 100p/m (2.5 x 65mm Mini-Vision BMS), LCX 40p, RCA dominant, nl, EF 30%.  Marland Kitchen GERD (gastroesophageal reflux disease)   . Myocardial infarction   . Shortness of breath   . Asthma   . COPD (chronic obstructive pulmonary disease)    Past Surgical History  Procedure Laterality Date  . Cholecystectomy  03/2011    Dr. Marlou Starks  .  Colon surgery    . Cardiac catheterization  Sept 2012    Normal left main, occluded LAD, 40% LCX and normal RCA. EF is 30%  . US echocardiography  Sept 2012    EF 25 to 30% with akinesis of the mid to distal anterior and apical myocardium, trivial AI and no apical thrombus  . Cardiac defibrillator placement      single chamber  . Shoulder arthroscopy      left, rotatotor cuff tendinopathy   Social History:  History   Social History Narrative   Lives with his granddaughter at home.    Daughter lives next door and is his healthcare power of  attorney   Experiences dyspnea with minimal activity - sedentary.  Wife died about 01-19-99 and   Retired Airline pilot.    No Known Allergies  Family History  Problem Relation Age of Onset  . Lung disease Father   lete)  Prior to Admission medications   Medication Sig Start Date End Date Taking? Authorizing Provider  albuterol (PROVENTIL HFA;VENTOLIN HFA) 108 (90 BASE) MCG/ACT inhaler Inhale 2 puffs into the lungs every 6 (six) hours as needed for wheezing or shortness of breath. 05/18/13  Yes Modena Jansky, MD  albuterol (PROVENTIL) (2.5 MG/3ML) 0.083% nebulizer solution Take 2.5 mg by nebulization every 4 (four) hours as needed for wheezing or shortness of breath.   Yes Historical Provider, MD  aspirin EC 81 MG tablet Take 81 mg by mouth daily.   Yes Historical Provider, MD  carvedilol (COREG) 25 MG tablet Take 25 mg by mouth 2 (two) times daily with a meal.   Yes Historical Provider, MD  furosemide (LASIX) 40 MG tablet Take 20 mg by mouth daily.    Yes Historical Provider, MD  hydroxyurea (HYDREA) 500 MG capsule Take 1 capsule (500 mg total) by mouth daily. May take with food to minimize GI side effects. 12/10/13  Yes Chauncey Cruel, MD  lisinopril (PRINIVIL,ZESTRIL) 10 MG tablet Take 1 tablet (10 mg total) by mouth daily. 05/18/13  Yes Modena Jansky, MD  lovastatin (MEVACOR) 20 MG tablet Take 20 mg by mouth at bedtime.   Yes Historical Provider, MD  omeprazole (PRILOSEC) 20 MG capsule Take 20 mg by mouth daily.   Yes Historical Provider, MD   Physical Exam: Filed Vitals:   02/21/14 1330 02/21/14 1400 02/21/14 1515 02/21/14 1600  BP: 148/63 154/81    Pulse: 64 71  70  Temp:      TempSrc:      Resp: 21 25  29   SpO2: 98% 96% 92% 98%     General:  EOMI, NCAT , plethoric exam with flushed face by the veins   Eyes: No pallor no icterus reactive to light   ENT: Soft supple no submandibular lymphadenopathy   Neck:  no bruit no JVD noted no thyromegaly   Cardiovascular:  S1-S2  regular rhythm  Respiratory:  Surprisingly is clear with no added sounds or fremitus or resonance  Abdomen: Soft nontender nondistended no rebound   Skin:  no lower extremity edema , venous stasis changes but nonpitting   Musculoskeletal:  range of motion intact  Psychiatric:  euthymic and pleasant  Neurologic:  range of motion intact , power 5/5, cooperative exam able to do manual muscle testing   Labs on Admission:  Basic Metabolic Panel:  Recent Labs Lab 02/21/14 1020  NA 141  K 4.4  CL 104  CO2 29  GLUCOSE 85  BUN 25*  CREATININE  1.24  CALCIUM 9.7   Liver Function Tests:  Recent Labs Lab 02/21/14 1020  AST 10  ALT 7  ALKPHOS 66  BILITOT 0.4  PROT 6.3  ALBUMIN 3.3*   No results found for this basename: LIPASE, AMYLASE,  in the last 168 hours No results found for this basename: AMMONIA,  in the last 168 hours CBC:  Recent Labs Lab 02/21/14 1020  WBC 7.4  NEUTROABS 5.4  HGB 11.6*  HCT 37.9*  MCV 83.3  PLT 188   Cardiac Enzymes:  Recent Labs Lab 02/21/14 1020  TROPONINI <0.30    BNP (last 3 results)  Recent Labs  05/15/13 0526 02/21/14 1020  PROBNP 805.5* 539.4*   CBG: No results found for this basename: GLUCAP,  in the last 168 hours  Radiological Exams on Admission: Dg Chest 2 View  02/21/2014   CLINICAL DATA:  Shortness of breath and chest congestion  EXAM: CHEST  2 VIEW  COMPARISON:  DG CHEST 2 VIEW dated 05/17/2013  FINDINGS: Mild cardiomegaly noted with prominent interstitial markings and trace pleural effusions. Left-sided pacer in place. Flattening of the hemidiaphragms suggests element of COPD. No acute osseous abnormality.  IMPRESSION: Findings suggest interstitial pulmonary edema with trace pleural effusions.   Electronically Signed   By: Conchita Paris M.D.   On: 02/21/2014 10:43   Ct Angio Chest Pe W/cm &/or Wo Cm  02/21/2014   CLINICAL DATA:  Shortness of breath, elevated D-dimer, dyspnea, history of deep venous thrombosis  per report  EXAM: CT ANGIOGRAPHY CHEST WITH CONTRAST  TECHNIQUE: Multidetector CT imaging of the chest was performed using the standard protocol during bolus administration of intravenous contrast. Multiplanar CT image reconstructions and MIPs were obtained to evaluate the vascular anatomy.  CONTRAST:  170mL OMNIPAQUE IOHEXOL 350 MG/ML SOLN  COMPARISON:  DG CHEST 2 VIEW dated 02/21/2014; CT ANGIO CHEST W/CM &/OR WO/CM dated 10/12/2012  FINDINGS: Left-sided pacer in place. Superficial right chest wall vascular collaterals are noted. The examination is adequate for evaluation for acute pulmonary embolism up to and including the 3rd order pulmonary arteries. No focal filling defect is identified up to and including the 3rd order pulmonary arteries to suggest acute pulmonary embolism. Extrinsic to the right main pulmonary artery and extending to the right hilum, is a 1.1 cm elliptical hypodense lesion measuring 33 Hounsfield units. Compared to the prior exam, this is new. Small hilar lymph nodes are otherwise stable bilaterally. Great vessels are normal in caliber.  Heart size is normal. Extensive atheromatous aortic calcification noted.  Calcified right pleural plaque with presumed adjacent rounded atelectasis/ scarring pre identified. Left adrenal diffuse enlargement is reidentified. Right upper renal pole cortical lobulation and possible subcentimeter cysts reidentified but not further characterized. Diffuse severe emphysematous changes are noted. Subpleural bibasilar scarring reidentified.  Review of the MIP images confirms the above findings.  IMPRESSION: No CT evidence for acute intrathoracic abnormality.  Specifically, no definite evidence for acute pulmonary embolism. Low-density elliptical shaped lesion adjacent to the right main pulmonary artery appears to produce extrinsic compression upon the artery, favoring loculated fluid within a pericardial recess or possibly reactive lymph node. Partially resorbed remote  peripheral pulmonary arterial embolism is possible but felt less likely. Followup chest CT is recommended in 4-6 months with contrast for better assessment.  Emphysematous changes with evidence of asbestos exposure and right lower lobe rounded atelectasis.   Electronically Signed   By: Conchita Paris M.D.   On: 02/21/2014 13:24    EKG: Independently reviewed.  see above  Assessment/Plan Principal Problem:   Acute-on-chronic respiratory failure-likely AECOPD-treat with inhaled nebulizers every 4 when necessary, add steroids to 60 mg prednisone . Hold off on antibiotics given no productive sputum at present time .  Patient will need education regarding inhalers as he is only on albuterol which is a rescue inhaler but he thought that his nebulizer was to be used Q6 when necessary and inhaler was a rescue inhaler. I will prescribe for him Advair as well as ipratropium as he would classify as COPD Gold stage IV at least . He will need a burst of steroids for about 5-7 days and anticipate he will do well. He is very sedentary may benefit in the outpatient setting from cardiopulmonary have however not sure how much he would be related to .  Active Problems:   Polycythemia vera(238.4) currently with anemia presently-currently on Hydrea and gets prophylactic phlebotomy. To keep his hematocrit less than 40 which is clearly not at that . hydroxyurea was restarted February 2015. He will followup with his outpatient oncologist when needed   HTN (hypertension)-continue the medications as below. Blood pressure only moderately controlled currently    Ischemic cardiomyopathy, CAD history , placement of defibrillator 2012-keep on telemetry. Currently doing well -may benefit from alternative agent other than Coreg 25 mg . Continue ACE inhibitor lisinopril 10 daily , continue aspirin 81 mg daily    GERD (gastroesophageal reflux disease) continue Protonix in place of Prilosec -   COPD with acute exacerbation   History of  DVT 2001-completed therapy with Coumadin   History CVA-stable currently   acute kidney injury BN/creatinine 25/1.24. Hold IV fluids given heart failure history, hold Lasix and repeat labs in am   Hyperlipidemia-hold statin for now   Daughter is next of kin DO NOT RESUSCITATE Time spent: 50  [facility] Pager [phone]  If 7PM-7AM, please contact night-coverage [email or web address] Password [facility] 02/21/2014, 5:12 PM

## 2014-02-21 NOTE — ED Notes (Signed)
Resp at bedside

## 2014-02-21 NOTE — ED Notes (Signed)
Per EMS pt coming from home with c/o shortness of breath. Pt sts woke up around 4:00 this morning, felt short of breath, had breathing treatment which didn't relieve his symptoms. Per EMS pt has hx of COPD. EMS reports O2 sats 100% on room air.

## 2014-02-21 NOTE — ED Provider Notes (Addendum)
CSN: [id number]     Arrival date & time 02/21/14  7902 History   First MD Initiated Contact with Patient 02/21/14 0945     Chief Complaint  Patient presents with  . Shortness of Breath     (Consider location/radiation/quality/duration/timing/severity/associated sxs/prior Treatment) HPI Place of shortness of breath onset 4:30 AM today. Has had similar episodes in the past multiple times. He he denies cough denies fever denies chest pain denies other associated symptoms. He treated himself with a home nebulizer, without relief. EMS treat patient with supplemental oxygen. Nothing makes symptoms better or worse. Denies noncompliance with medications. No other associated symptoms . Past Medical History  Diagnosis Date  . Polycythemia vera(238.4)     a. Used to receive chronic phlebotomies until 2007, at [facility].  will restart his phlebotomies from about Mar 15 2011  . Stomach ulcer   . Tobacco abuse     a. cigars  . Duodenal perforation June 2012  . Peritonitis June 2012  . Pneumonia April 2012  . Systolic CHF, chronic     a. 12/2011 Echo: EF 25-30%, mid-dist antsept/inf, apical AK, Gr 1 DD, Triv AI, Mild MR.  . Ischemic cardiomyopathy     a. 01/2012 S/P MDT Protecta single lead ICD, ser # [id number]  . History of DVT (deep vein thrombosis)   . Chronic bronchitis   . CAD (coronary artery disease)     a. 07/2011 Anterior apical STEMI/Cath/PCI: LM nl, LAD 100p/m (2.5 x 37mm Mini-Vision BMS), LCX 40p, RCA dominant, nl, EF 30%.  Marland Kitchen GERD (gastroesophageal reflux disease)   . Myocardial infarction   . Shortness of breath   . Asthma   . COPD (chronic obstructive pulmonary disease)    Past Surgical History  Procedure Laterality Date  . Cholecystectomy  03/2011    Dr. Marlou Starks  . Colon surgery    . Cardiac catheterization  Sept 2012    Normal left main, occluded LAD, 40% LCX and normal RCA. EF is 30%  . US echocardiography  Sept 2012    EF 25 to 30% with akinesis of the mid to  distal anterior and apical myocardium, trivial AI and no apical thrombus  . Cardiac defibrillator placement      single chamber  . Shoulder arthroscopy      left, rotatotor cuff tendinopathy   Family History  Problem Relation Age of Onset  . Lung disease Father    History  Substance Use Topics  . Smoking status: Former Smoker -- 0.50 packs/day for 60 years    Types: Cigars    Quit date: 05/18/2013  . Smokeless tobacco: Current User    Types: Chew     Comment: Smoked .5-1ppd of cigarettes from age 33 to a few yrs ago and has been smoking 10 small cigars daily since then.  . Alcohol Use: Yes     Comment: rarely     Review of Systems  Constitutional: Negative.   HENT: Negative.   Respiratory: Positive for shortness of breath.   Cardiovascular: Negative.   Gastrointestinal: Negative.   Musculoskeletal: Negative.   Skin: Negative.   Neurological: Negative.   Psychiatric/Behavioral: Negative.   All other systems reviewed and are negative.     Allergies  Review of patient's allergies indicates no known allergies.  Home Medications   Prior to Admission medications   Medication Sig Start Date End Date Taking? Authorizing Provider  albuterol (PROVENTIL) (2.5 MG/3ML) 0.083% nebulizer solution Take 2.5 mg by nebulization every 4 (four)  hours as needed for wheezing or shortness of breath.   Yes Historical Provider, MD  carvedilol (COREG) 25 MG tablet Take 25 mg by mouth 2 (two) times daily with a meal.   Yes Historical Provider, MD  furosemide (LASIX) 40 MG tablet Take 20 mg by mouth daily as needed for fluid.   Yes Historical Provider, MD  hydroxyurea (HYDREA) 500 MG capsule Take 1 capsule (500 mg total) by mouth daily. May take with food to minimize GI side effects. 12/10/13  Yes Chauncey Cruel, MD  lovastatin (MEVACOR) 20 MG tablet Take 20 mg by mouth at bedtime.   Yes Historical Provider, MD  albuterol (PROVENTIL HFA;VENTOLIN HFA) 108 (90 BASE) MCG/ACT inhaler Inhale 2 puffs  into the lungs every 6 (six) hours as needed for wheezing or shortness of breath. 05/18/13   Modena Jansky, MD  aspirin EC 81 MG tablet Take 81 mg by mouth daily.    Historical Provider, MD  ipratropium-albuterol (DUONEB) 0.5-2.5 (3) MG/3ML SOLN Take 3 mLs by nebulization every 6 (six) hours as needed (Wheezing or shortness of breath.). 05/18/13   Modena Jansky, MD  lisinopril (PRINIVIL,ZESTRIL) 10 MG tablet Take 1 tablet (10 mg total) by mouth daily. 05/18/13   Modena Jansky, MD  omeprazole (PRILOSEC) 20 MG capsule Take 20 mg by mouth daily.    Historical Provider, MD   BP 153/72  Pulse 72  Temp(Src) 97.7 F (36.5 C) (Oral)  Resp 22  SpO2 97% Physical Exam  Nursing note and vitals reviewed. Constitutional: He appears well-developed and well-nourished. No distress.  Speaks in paragraphs no respiratory distress  HENT:  Head: Normocephalic and atraumatic.  Eyes: Conjunctivae are normal. Pupils are equal, round, and reactive to light.  Neck: Neck supple. No tracheal deviation present. No thyromegaly present.  Cardiovascular: Normal rate and regular rhythm.   No murmur heard. Pulmonary/Chest: Effort normal and breath sounds normal. No respiratory distress. He has no wheezes. He has no rales.  Diminished breath sounds diffusely. No retractions  Abdominal: Soft. Bowel sounds are normal. He exhibits no distension. There is no tenderness.  Musculoskeletal: Normal range of motion. He exhibits edema. He exhibits no tenderness.  Trace pretibial pitting edema bilaterally  Neurological: He is alert. Coordination normal.  Skin: Skin is warm and dry. No rash noted.  Psychiatric: He has a normal mood and affect.    ED Course  Procedures (including critical care time) Labs Review Labs Reviewed - No data to display  Imaging Review No results found.   EKG Interpretation None      Date: 02/21/2014  Rate: 70  Rhythm: normal sinus rhythm  QRS Axis: normal  Intervals: normal  ST/T  Wave abnormalities: nonspecific ST changes  Conduction Disutrbances:nonspecific intraventricular conduction delay  Narrative Interpretation:   Old EKG Reviewed: Lateral ischemic changes and nonspecific ST-T wave changes. With no significant change from 05/15/2013 interpreted by me Results for orders placed during the hospital encounter of 02/21/14  TROPONIN I      Result Value Ref Range   Troponin I <0.30  <0.30 ng/mL  CBC WITH DIFFERENTIAL      Result Value Ref Range   WBC 7.4  4.0 - 10.5 K/uL   RBC 4.55  4.22 - 5.81 MIL/uL   Hemoglobin 11.6 (*) 13.0 - 17.0 g/dL   HCT 37.9 (*) 39.0 - 52.0 %   MCV 83.3  78.0 - 100.0 fL   MCH 25.5 (*) 26.0 - 34.0 pg   MCHC 30.6  30.0 - 36.0 g/dL   RDW 17.0 (*) 11.5 - 15.5 %   Platelets 188  150 - 400 K/uL   Neutrophils Relative % 73  43 - 77 %   Neutro Abs 5.4  1.7 - 7.7 K/uL   Lymphocytes Relative 16  12 - 46 %   Lymphs Abs 1.2  0.7 - 4.0 K/uL   Monocytes Relative 8  3 - 12 %   Monocytes Absolute 0.6  0.1 - 1.0 K/uL   Eosinophils Relative 2  0 - 5 %   Eosinophils Absolute 0.2  0.0 - 0.7 K/uL   Basophils Relative 0  0 - 1 %   Basophils Absolute 0.0  0.0 - 0.1 K/uL  COMPREHENSIVE METABOLIC PANEL      Result Value Ref Range   Sodium 141  137 - 147 mEq/L   Potassium 4.4  3.7 - 5.3 mEq/L   Chloride 104  96 - 112 mEq/L   CO2 29  19 - 32 mEq/L   Glucose, Bld 85  70 - 99 mg/dL   BUN 25 (*) 6 - 23 mg/dL   Creatinine, Ser 1.24  0.50 - 1.35 mg/dL   Calcium 9.7  8.4 - 10.5 mg/dL   Total Protein 6.3  6.0 - 8.3 g/dL   Albumin 3.3 (*) 3.5 - 5.2 g/dL   AST 10  0 - 37 U/L   ALT 7  0 - 53 U/L   Alkaline Phosphatase 66  39 - 117 U/L   Total Bilirubin 0.4  0.3 - 1.2 mg/dL   GFR calc non Af Amer 55 (*) >90 mL/min   GFR calc Af Amer 64 (*) >90 mL/min  PRO B NATRIURETIC PEPTIDE      Result Value Ref Range   Pro B Natriuretic peptide (BNP) 539.4 (*) 0 - 450 pg/mL  D-DIMER, QUANTITATIVE      Result Value Ref Range   D-Dimer, Quant 3.39 (*) 0.00 - 0.48  ug/mL-FEU   Dg Chest 2 View  02/21/2014   CLINICAL DATA:  Shortness of breath and chest congestion  EXAM: CHEST  2 VIEW  COMPARISON:  DG CHEST 2 VIEW dated 05/17/2013  FINDINGS: Mild cardiomegaly noted with prominent interstitial markings and trace pleural effusions. Left-sided pacer in place. Flattening of the hemidiaphragms suggests element of COPD. No acute osseous abnormality.  IMPRESSION: Findings suggest interstitial pulmonary edema with trace pleural effusions.   Electronically Signed   By: Conchita Paris M.D.   On: 02/21/2014 10:43   Ct Angio Chest Pe W/cm &/or Wo Cm  02/21/2014   CLINICAL DATA:  Shortness of breath, elevated D-dimer, dyspnea, history of deep venous thrombosis per report  EXAM: CT ANGIOGRAPHY CHEST WITH CONTRAST  TECHNIQUE: Multidetector CT imaging of the chest was performed using the standard protocol during bolus administration of intravenous contrast. Multiplanar CT image reconstructions and MIPs were obtained to evaluate the vascular anatomy.  CONTRAST:  138mL OMNIPAQUE IOHEXOL 350 MG/ML SOLN  COMPARISON:  DG CHEST 2 VIEW dated 02/21/2014; CT ANGIO CHEST W/CM &/OR WO/CM dated 10/12/2012  FINDINGS: Left-sided pacer in place. Superficial right chest wall vascular collaterals are noted. The examination is adequate for evaluation for acute pulmonary embolism up to and including the 3rd order pulmonary arteries. No focal filling defect is identified up to and including the 3rd order pulmonary arteries to suggest acute pulmonary embolism. Extrinsic to the right main pulmonary artery and extending to the right hilum, is a 1.1 cm elliptical hypodense lesion measuring 33 Hounsfield  units. Compared to the prior exam, this is new. Small hilar lymph nodes are otherwise stable bilaterally. Great vessels are normal in caliber.  Heart size is normal. Extensive atheromatous aortic calcification noted.  Calcified right pleural plaque with presumed adjacent rounded atelectasis/ scarring pre  identified. Left adrenal diffuse enlargement is reidentified. Right upper renal pole cortical lobulation and possible subcentimeter cysts reidentified but not further characterized. Diffuse severe emphysematous changes are noted. Subpleural bibasilar scarring reidentified.  Review of the MIP images confirms the above findings.  IMPRESSION: No CT evidence for acute intrathoracic abnormality.  Specifically, no definite evidence for acute pulmonary embolism. Low-density elliptical shaped lesion adjacent to the right main pulmonary artery appears to produce extrinsic compression upon the artery, favoring loculated fluid within a pericardial recess or possibly reactive lymph node. Partially resorbed remote peripheral pulmonary arterial embolism is possible but felt less likely. Followup chest CT is recommended in 4-6 months with contrast for better assessment.  Emphysematous changes with evidence of asbestos exposure and right lower lobe rounded atelectasis.   Electronically Signed   By: Conchita Paris M.D.   On: 02/21/2014 13:24     11:50 AM reading improved after treatment with albuterol nebulizer. Not at baseline. One hour of continuous nebulization and prednisone subsequently ordered. Patient ambulated in the ED and pulse oximetry desaturated and he became dyspneic at 4:30 PM he breathing still does not feel at baseline..While resting in bed he is able to speak in paragraphs and appears in no respiratory distress  MDM   Final diagnoses:  None   Spoke with Dr.Samtani who will arrange for inpatient stay . I don't feel the patient is a fluid overloaded based on BNP which is approximately at baseline. And chest CT scan Pulmonary embolism has been ruled out. Symptoms and exam most likely consistent with COPD exacerbation Dx: acute dyspnea    Orlie Dakin, MD 02/21/14 [place], MD 02/21/14 [phone]

## 2014-02-21 NOTE — ED Notes (Signed)
Bed: WA04 Expected date:  Expected time:  Means of arrival:  Comments: EMS- SOB, 100%RA

## 2014-02-22 LAB — CBC
HEMATOCRIT: 36.7 % — AB (ref 39.0–52.0)
HEMOGLOBIN: 11.4 g/dL — AB (ref 13.0–17.0)
MCH: 25.4 pg — ABNORMAL LOW (ref 26.0–34.0)
MCHC: 31.1 g/dL (ref 30.0–36.0)
MCV: 81.9 fL (ref 78.0–100.0)
PLATELETS: 177 10*3/uL (ref 150–400)
RBC: 4.48 MIL/uL (ref 4.22–5.81)
RDW: 16.7 % — ABNORMAL HIGH (ref 11.5–15.5)
WBC: 9.5 10*3/uL (ref 4.0–10.5)

## 2014-02-22 LAB — COMPREHENSIVE METABOLIC PANEL
ALBUMIN: 3.2 g/dL — AB (ref 3.5–5.2)
ALT: 7 U/L (ref 0–53)
AST: 11 U/L (ref 0–37)
Alkaline Phosphatase: 66 U/L (ref 39–117)
BUN: 28 mg/dL — ABNORMAL HIGH (ref 6–23)
CALCIUM: 9.2 mg/dL (ref 8.4–10.5)
CO2: 24 mEq/L (ref 19–32)
CREATININE: 1.32 mg/dL (ref 0.50–1.35)
Chloride: 105 mEq/L (ref 96–112)
GFR calc Af Amer: 59 mL/min — ABNORMAL LOW (ref >90)
GFR calc non Af Amer: 51 mL/min — ABNORMAL LOW (ref >90)
Glucose, Bld: 107 mg/dL — ABNORMAL HIGH (ref 70–99)
Potassium: 5.6 mEq/L — ABNORMAL HIGH (ref 3.7–5.3)
Sodium: 139 mEq/L (ref 137–147)
TOTAL PROTEIN: 6.1 g/dL (ref 6.0–8.3)
Total Bilirubin: 0.4 mg/dL (ref 0.3–1.2)

## 2014-02-22 LAB — RETICULOCYTES
RBC.: 4.48 MIL/uL (ref 4.22–5.81)
RETIC CT PCT: 1.8 % (ref 0.4–3.1)
Retic Count, Absolute: 80.6 10*3/uL (ref 19.0–186.0)

## 2014-02-22 LAB — PROTIME-INR
INR: 0.99 (ref 0.00–1.49)
Prothrombin Time: 12.9 seconds (ref 11.6–15.2)

## 2014-02-22 MED ORDER — SALINE SPRAY 0.65 % NA SOLN
1.0000 | NASAL | Status: DC | PRN
  Administered 2014-02-22: 1 via NASAL
  Filled 2014-02-22: qty 44

## 2014-02-22 MED ORDER — AMLODIPINE BESYLATE 10 MG PO TABS
10.0000 mg | ORAL_TABLET | Freq: Every day | ORAL | Status: DC
  Administered 2014-02-22 – 2014-02-23 (×2): 10 mg via ORAL
  Filled 2014-02-22 (×2): qty 1

## 2014-02-22 MED ORDER — IPRATROPIUM-ALBUTEROL 0.5-2.5 (3) MG/3ML IN SOLN
3.0000 mL | Freq: Three times a day (TID) | RESPIRATORY_TRACT | Status: DC
  Administered 2014-02-23: 3 mL via RESPIRATORY_TRACT
  Filled 2014-02-22: qty 3

## 2014-02-22 MED ORDER — LISINOPRIL 5 MG PO TABS
5.0000 mg | ORAL_TABLET | Freq: Every day | ORAL | Status: DC
  Administered 2014-02-23: 5 mg via ORAL
  Filled 2014-02-22: qty 1

## 2014-02-22 MED ORDER — FLUTICASONE PROPIONATE 50 MCG/ACT NA SUSP
1.0000 | Freq: Every day | NASAL | Status: DC
  Administered 2014-02-22 – 2014-02-23 (×2): 1 via NASAL
  Filled 2014-02-22: qty 16

## 2014-02-22 NOTE — Progress Notes (Addendum)
Rt educated pt on Dulera, Albuterol and Ipratropium Bromide per request of Dr.Samtani. Pt knows and under stands the importance of the medication.

## 2014-02-22 NOTE — Progress Notes (Signed)
Checked pts oxygen saturation prior to ambulating; sats were 90% on RA. Ambulated with pt around the unit on RA, pt sats dropped to 84% RA was the lowest reading. Pt was dyspneic when we returned to his room and took some deep breaths. Sats rechecked after pt was able to catch his breath his oxygen returned back to 90% on RA. MD notified of results.

## 2014-02-22 NOTE — Progress Notes (Signed)
Note: This document was prepared with digital dictation and possible smart phrase technology. Any transcriptional errors that result from this process are unintentional.   MALAKYE NOLDEN FWY:[id number] DOB: 1936-10-31 DOA: 02/21/2014 PCP: Leonard Downing, MD  Brief narrative: 78 y.o. male came to [facility] 02/21/2014 with shortness of breath. No history of polycythemia vera, CAD history 07/2011 anterior apical MI S./P. BMS to LAD resulting in ischemic cardiomyopathy with systolic CHF [systolic 27%] s/p defibrillator 01/2012, severe COPD FEV1/FVC 0.40, H/o DVt in 2001 and [place] to have AECOPD and admitted for the same  Past medical history-As per Problem list Chart reviewed as below-   Consultants:  none  Procedures:  none  Antibiotics:  none   Subjective  Alert, pleasant nad Tol diet.  Ambulant but mildy winded after walking.  Needing oxygen after ambulation   Objective    Interim History: None   Telemetry: nsr   Objective: Filed Vitals:   02/22/14 0502 02/22/14 0916 02/22/14 1245 02/22/14 1432  BP: 103/66   147/76  Pulse: 72   72  Temp: 97.5 F (36.4 C)   97.4 F (36.3 C)  TempSrc: Oral   Oral  Resp: 24   18  Height:      Weight: 100.6 kg (221 lb 12.5 oz)     SpO2: 96% 90% 96% 91%    Intake/Output Summary (Last 24 hours) at 02/22/14 1506 Last data filed at 02/22/14 1300  Gross per 24 hour  Intake   1080 ml  Output    725 ml  Net    355 ml    Exam:  General: alert pleasant oriented Cardiovascular: s1 s2 no m/r/g Respiratory: clear, no added sound, good AE Abdomen:  Soft, NT, ND Skin no Le edema Neuro Intacts  Data Reviewed: Basic Metabolic Panel:  Recent Labs Lab 02/21/14 1020 02/22/14 0428  NA 141 139  K 4.4 5.6*  CL 104 105  CO2 29 24  GLUCOSE 85 107*  BUN 25* 28*  CREATININE 1.24 1.32  CALCIUM 9.7 9.2   Liver Function Tests:  Recent Labs Lab 02/21/14 1020 02/22/14 0428  AST 10 11  ALT 7 7  ALKPHOS 66 66    BILITOT 0.4 0.4  PROT 6.3 6.1  ALBUMIN 3.3* 3.2*   No results found for this basename: LIPASE, AMYLASE,  in the last 168 hours No results found for this basename: AMMONIA,  in the last 168 hours CBC:  Recent Labs Lab 02/21/14 1020 02/22/14 0428  WBC 7.4 9.5  NEUTROABS 5.4  --   HGB 11.6* 11.4*  HCT 37.9* 36.7*  MCV 83.3 81.9  PLT 188 177   Cardiac Enzymes:  Recent Labs Lab 02/21/14 1020  TROPONINI <0.30   BNP: No components found with this basename: POCBNP,  CBG: No results found for this basename: GLUCAP,  in the last 168 hours  No results found for this or any previous visit (from the past 240 hour(s)).   Studies:              All Imaging reviewed and is as per above notation   Scheduled Meds: . aspirin EC  81 mg Oral Daily  . carvedilol  25 mg Oral BID WC  . fluticasone  1 spray Each Nare Daily  . furosemide  20 mg Oral Daily  . heparin  5,000 Units Subcutaneous 3 times per day  . hydroxyurea  500 mg Oral Daily  . ipratropium  0.5 mg Nebulization Q4H  .  lisinopril  10 mg Oral Daily  . mometasone-formoterol  2 puff Inhalation BID  . pantoprazole  40 mg Oral Daily  . predniSONE  60 mg Oral QAC breakfast  . sodium chloride  3 mL Intravenous Q12H   Continuous Infusions:    Assessment/Plan  Principal Problem:  Acute-on-chronic respiratory failure-likely AECOPD-treat with inhaled nebulizers every 4 when necessary, add steroids to 60 mg prednisone . Patient educated regarding inhalers as he is only on albuterol which is a rescue inhaler but he thought that his nebulizer was to be used Q6 when necessary and inhaler was a controller med RT to teach use of proper use of inhaler and when to use what  I will prescribe for him Advair as well as ipratropium as he would classify as COPD Gold stage IV at least . He will need a burst of steroids for about 5-7 days and anticipate he will do well.might benefit from Cardio-pulm rehab  Polycythemia vera(238.4) currently  with anemia presently-currently on Hydrea and gets prophylactic phlebotomy. To keep his hematocrit less than 40 which is clearly not at that . hydroxyurea was restarted February 2015. He will followup with his outpatient oncologist when needed   HTN (hypertension)-continue the medications as below. Blood pressure only moderately controlled currently.  Increase Lisinopril or add Amlodpine if still elevated in am  Ischemic cardiomyopathy, CAD history , placement of defibrillator 2012-keep on telemetry. Currently doing well -may benefit from alternative agent other than Coreg 25 mg as has COPD . Continue ACE inhibitor lisinopril 10 daily , continue aspirin 81 mg daily   GERD (gastroesophageal reflux disease) continue Protonix in place of Prilosec   History of DVT 2001-completed therapy with Coumadin  History CVA-stable currently   acute kidney injury BN/creatinine 25/1.24. Labs about the same monitor in the am.  Lasix held  Hyperlipidemia-hold statin for now   Code Status: Full Family Communication:  NOne bedside Disposition Plan: inpatient, likely d/c in am   Verneita Griffes, MD  [facility] Pager [phone] 02/22/2014, 3:06 PM    LOS: 1 day

## 2014-02-23 LAB — CBC
HCT: 37.2 % — ABNORMAL LOW (ref 39.0–52.0)
Hemoglobin: 11.4 g/dL — ABNORMAL LOW (ref 13.0–17.0)
MCH: 25.2 pg — ABNORMAL LOW (ref 26.0–34.0)
MCHC: 30.6 g/dL (ref 30.0–36.0)
MCV: 82.3 fL (ref 78.0–100.0)
Platelets: 173 10*3/uL (ref 150–400)
RBC: 4.52 MIL/uL (ref 4.22–5.81)
RDW: 16.8 % — ABNORMAL HIGH (ref 11.5–15.5)
WBC: 11 10*3/uL — ABNORMAL HIGH (ref 4.0–10.5)

## 2014-02-23 LAB — BASIC METABOLIC PANEL
BUN: 32 mg/dL — AB (ref 6–23)
CO2: 27 mEq/L (ref 19–32)
Calcium: 9.1 mg/dL (ref 8.4–10.5)
Chloride: 103 mEq/L (ref 96–112)
Creatinine, Ser: 1.36 mg/dL — ABNORMAL HIGH (ref 0.50–1.35)
GFR calc Af Amer: 57 mL/min — ABNORMAL LOW (ref >90)
GFR calc non Af Amer: 49 mL/min — ABNORMAL LOW (ref >90)
Glucose, Bld: 105 mg/dL — ABNORMAL HIGH (ref 70–99)
Potassium: 5 mEq/L (ref 3.7–5.3)
SODIUM: 138 meq/L (ref 137–147)

## 2014-02-23 MED ORDER — AMLODIPINE BESYLATE 10 MG PO TABS: 10.0000 mg | ORAL_TABLET | Freq: Every day | ORAL | Status: DC

## 2014-02-23 MED ORDER — MOMETASONE FURO-FORMOTEROL FUM 100-5 MCG/ACT IN AERO: 2.0000 | INHALATION_SPRAY | Freq: Two times a day (BID) | RESPIRATORY_TRACT | Status: DC

## 2014-02-23 MED ORDER — PREDNISONE 20 MG PO TABS: 60.0000 mg | ORAL_TABLET | Freq: Every day | ORAL | Status: DC

## 2014-02-23 NOTE — Discharge Summary (Signed)
Physician Discharge Summary  Jesus Sanders ZHG:[id number] DOB: 14-Aug-1936 DOA: 02/21/2014  PCP: Leonard Downing, MD  Admit date: 02/21/2014 Discharge date: 02/23/2014  Time spent: 40 minutes  Recommendations for Outpatient Follow-up:  1. New amlodipine added 2. Rec Cardio-piulm rehab, Pulmonology referral 3. Cmet/CBC 1 week  Discharge Diagnoses:  Principal Problem:   Acute-on-chronic respiratory failure Active Problems:   Polycythemia vera(238.4)   HTN (hypertension)   Ischemic cardiomyopathy   GERD (gastroesophageal reflux disease)   COPD exacerbation, likely   COPD with acute exacerbation   Discharge Condition: good  Diet recommendation: reg  Filed Weights   02/21/14 1945 02/22/14 0502  Weight: 100.8 kg (222 lb 3.6 oz) 100.6 kg (221 lb 12.5 oz)    History of present illness:  78 y.o. male came to [facility] ed 02/21/2014 with shortness of breath. No history of polycythemia vera, CAD history 07/2011 anterior apical MI S./P. BMS to LAD resulting in ischemic cardiomyopathy with systolic CHF [systolic 19%] s/p defibrillator 01/2012, severe COPD FEV1/FVC 0.40, H/o DVt in 2001 and [place] to have AECOPD and admitted for the same   Hospital Course:   Acute-on-chronic respiratory failure-likely AECOPD-treat with inhaled nebulizers every 4 when necessary, add steroids to 60 mg prednisone . Patient educated regarding inhalers as he is only on albuterol which is a rescue inhaler but he thought that his nebulizer was to be used Q6 when necessary and inhaler was a controller med  RT to teach use of proper use of inhaler and when to use what  I will prescribe for him Advair  forCOPD Gold stage IV at least . He will need a burst of steroids for about 5-7 days and anticipate he will do well.might benefit from Cardio-pulm rehab   Polycythemia vera(238.4) currently with anemia presently-currently on Hydrea and gets prophylactic phlebotomy. To keep his hematocrit less than 40 which is  clearly not at that . hydroxyurea was restarted February 2015. He will followup with his outpatient oncologist when needed   HTN (hypertension)-continue the medications as below. Blood pressure only moderately controlled currently. Increase Lisinopril or add Amlodpine if still elevated in am   Ischemic cardiomyopathy, CAD history , placement of defibrillator 2012-keep on telemetry. Currently doing well -may benefit from alternative agent other than Coreg 25 mg as has COPD . Continue ACE inhibitor lisinopril 10 daily , continue aspirin 81 mg daily   GERD (gastroesophageal reflux disease) continue Protonix in place of Prilosec   History of DVT 2001-completed therapy with Coumadin   History CVA-stable currently   acute kidney injury BN/creatinine 25/1.24. Labs about the same monitor in the am. Lasix held initially but re-started.  Will need close monitoring of bmet as OP  Hyperlipidemia-hold statin for now   Consultants:  none Procedures:  none Antibiotics:  none   Discharge Exam: Filed Vitals:   02/23/14 0649  BP: 136/74  Pulse: 70  Temp: 98.1 F (36.7 C)  Resp: 18    General: alert oriented, NAD Cardiovascular: s1 s2 no m/r/g Respiratory: clear  Discharge Instructions You were cared for by a hospitalist during your hospital stay. If you have any questions about your discharge medications or the care you received while you were in the hospital after you are discharged, you can call the unit and asked to speak with the hospitalist on call if the hospitalist that took care of you is not available. Once you are discharged, your primary care physician will handle any further medical issues. Please note that NO  REFILLS for any discharge medications will be authorized once you are discharged, as it is imperative that you return to your primary care physician (or establish a relationship with a primary care physician if you do not have one) for your aftercare needs so that they can  reassess your need for medications and monitor your lab values.  Discharge Orders   Future Appointments Provider Department Dept Phone   02/26/2014 9:30 AM [facility] [phone]   03/06/2014 10:30 AM [facility] [phone]   03/06/2014 11:00 AM Chauncey Cruel, MD [facility] [phone]   Future Orders Complete By Expires   Diet - low sodium heart healthy  As directed    Discharge instructions  As directed    Increase activity slowly  As directed        Medication List         albuterol 108 (90 BASE) MCG/ACT inhaler  Commonly known as:  PROVENTIL HFA;VENTOLIN HFA  Inhale 2 puffs into the lungs every 6 (six) hours as needed for wheezing or shortness of breath.     amLODipine 10 MG tablet  Commonly known as:  NORVASC  Take 1 tablet (10 mg total) by mouth daily.     aspirin EC 81 MG tablet  Take 81 mg by mouth daily.     carvedilol 25 MG tablet  Commonly known as:  COREG  Take 25 mg by mouth 2 (two) times daily with a meal.     furosemide 40 MG tablet  Commonly known as:  LASIX  Take 20 mg by mouth daily.     hydroxyurea 500 MG capsule  Commonly known as:  HYDREA  Take 1 capsule (500 mg total) by mouth daily. May take with food to minimize GI side effects.     lisinopril 10 MG tablet  Commonly known as:  PRINIVIL,ZESTRIL  Take 1 tablet (10 mg total) by mouth daily.     lovastatin 20 MG tablet  Commonly known as:  MEVACOR  Take 20 mg by mouth at bedtime.     mometasone-formoterol 100-5 MCG/ACT Aero  Commonly known as:  DULERA  Inhale 2 puffs into the lungs 2 (two) times daily.     omeprazole 20 MG capsule  Commonly known as:  PRILOSEC  Take 20 mg by mouth daily.     predniSONE 20 MG tablet  Commonly known as:  DELTASONE  Take 3 tablets (60 mg total) by mouth daily before breakfast.       No Known Allergies    The results  of significant diagnostics from this hospitalization (including imaging, microbiology, ancillary and laboratory) are listed below for reference.    Significant Diagnostic Studies: Dg Chest 2 View  02/21/2014   CLINICAL DATA:  Shortness of breath and chest congestion  EXAM: CHEST  2 VIEW  COMPARISON:  DG CHEST 2 VIEW dated 05/17/2013  FINDINGS: Mild cardiomegaly noted with prominent interstitial markings and trace pleural effusions. Left-sided pacer in place. Flattening of the hemidiaphragms suggests element of COPD. No acute osseous abnormality.  IMPRESSION: Findings suggest interstitial pulmonary edema with trace pleural effusions.   Electronically Signed   By: Conchita Paris M.D.   On: 02/21/2014 10:43   Ct Angio Chest Pe W/cm &/or Wo Cm  02/21/2014   CLINICAL DATA:  Shortness of breath, elevated D-dimer, dyspnea, history of deep venous thrombosis per report  EXAM: CT ANGIOGRAPHY CHEST  WITH CONTRAST  TECHNIQUE: Multidetector CT imaging of the chest was performed using the standard protocol during bolus administration of intravenous contrast. Multiplanar CT image reconstructions and MIPs were obtained to evaluate the vascular anatomy.  CONTRAST:  138mL OMNIPAQUE IOHEXOL 350 MG/ML SOLN  COMPARISON:  DG CHEST 2 VIEW dated 02/21/2014; CT ANGIO CHEST W/CM &/OR WO/CM dated 10/12/2012  FINDINGS: Left-sided pacer in place. Superficial right chest wall vascular collaterals are noted. The examination is adequate for evaluation for acute pulmonary embolism up to and including the 3rd order pulmonary arteries. No focal filling defect is identified up to and including the 3rd order pulmonary arteries to suggest acute pulmonary embolism. Extrinsic to the right main pulmonary artery and extending to the right hilum, is a 1.1 cm elliptical hypodense lesion measuring 33 Hounsfield units. Compared to the prior exam, this is new. Small hilar lymph nodes are otherwise stable bilaterally. Great vessels are normal in caliber.   Heart size is normal. Extensive atheromatous aortic calcification noted.  Calcified right pleural plaque with presumed adjacent rounded atelectasis/ scarring pre identified. Left adrenal diffuse enlargement is reidentified. Right upper renal pole cortical lobulation and possible subcentimeter cysts reidentified but not further characterized. Diffuse severe emphysematous changes are noted. Subpleural bibasilar scarring reidentified.  Review of the MIP images confirms the above findings.  IMPRESSION: No CT evidence for acute intrathoracic abnormality.  Specifically, no definite evidence for acute pulmonary embolism. Low-density elliptical shaped lesion adjacent to the right main pulmonary artery appears to produce extrinsic compression upon the artery, favoring loculated fluid within a pericardial recess or possibly reactive lymph node. Partially resorbed remote peripheral pulmonary arterial embolism is possible but felt less likely. Followup chest CT is recommended in 4-6 months with contrast for better assessment.  Emphysematous changes with evidence of asbestos exposure and right lower lobe rounded atelectasis.   Electronically Signed   By: Conchita Paris M.D.   On: 02/21/2014 13:24    Microbiology: No results found for this or any previous visit (from the past 240 hour(s)).   Labs: Basic Metabolic Panel:  Recent Labs Lab 02/21/14 1020 02/22/14 0428 02/23/14 0416  NA 141 139 138  K 4.4 5.6* 5.0  CL 104 105 103  CO2 29 24 27   GLUCOSE 85 107* 105*  BUN 25* 28* 32*  CREATININE 1.24 1.32 1.36*  CALCIUM 9.7 9.2 9.1   Liver Function Tests:  Recent Labs Lab 02/21/14 1020 02/22/14 0428  AST 10 11  ALT 7 7  ALKPHOS 66 66  BILITOT 0.4 0.4  PROT 6.3 6.1  ALBUMIN 3.3* 3.2*   No results found for this basename: LIPASE, AMYLASE,  in the last 168 hours No results found for this basename: AMMONIA,  in the last 168 hours CBC:  Recent Labs Lab 02/21/14 1020 02/22/14 0428 02/23/14 0416   WBC 7.4 9.5 11.0*  NEUTROABS 5.4  --   --   HGB 11.6* 11.4* 11.4*  HCT 37.9* 36.7* 37.2*  MCV 83.3 81.9 82.3  PLT 188 177 173   Cardiac Enzymes:  Recent Labs Lab 02/21/14 1020  TROPONINI <0.30   BNP: BNP (last 3 results)  Recent Labs  05/15/13 0526 02/21/14 1020  PROBNP 805.5* 539.4*   CBG: No results found for this basename: GLUCAP,  in the last 168 hours     Signed:  Jai-Gurmukh Khyla Mccumbers  [facility] 02/23/2014, 10:05 AM

## 2014-02-24 ENCOUNTER — Encounter: Payer: Self-pay | Admitting: Internal Medicine

## 2014-02-24 MED FILL — Albuterol Sulfate Soln Nebu 0.5% (5 MG/ML): RESPIRATORY_TRACT | Qty: 20 | Status: AC

## 2014-02-26 ENCOUNTER — Other Ambulatory Visit: Payer: Medicare Other

## 2014-03-06 ENCOUNTER — Other Ambulatory Visit: Payer: Medicare Other

## 2014-03-06 ENCOUNTER — Ambulatory Visit (HOSPITAL_BASED_OUTPATIENT_CLINIC_OR_DEPARTMENT_OTHER): Payer: Medicare Other | Admitting: Oncology

## 2014-03-06 VITALS — BP 146/76 | HR 78 | Temp 98.2°F | Resp 20 | Ht 72.0 in | Wt 225.4 lb

## 2014-03-06 DIAGNOSIS — D45 Polycythemia vera: Secondary | ICD-10-CM

## 2014-03-06 DIAGNOSIS — I5023 Acute on chronic systolic (congestive) heart failure: Secondary | ICD-10-CM

## 2014-03-06 LAB — CBC WITH DIFFERENTIAL/PLATELET
BASO%: 0.3 % (ref 0.0–2.0)
Basophils Absolute: 0 10*3/uL (ref 0.0–0.1)
EOS%: 2 % (ref 0.0–7.0)
Eosinophils Absolute: 0.2 10*3/uL (ref 0.0–0.5)
HEMATOCRIT: 39.2 % (ref 38.4–49.9)
HGB: 12 g/dL — ABNORMAL LOW (ref 13.0–17.1)
LYMPH#: 0.9 10*3/uL (ref 0.9–3.3)
LYMPH%: 9.5 % — ABNORMAL LOW (ref 14.0–49.0)
MCH: 25 pg — ABNORMAL LOW (ref 27.2–33.4)
MCHC: 30.7 g/dL — ABNORMAL LOW (ref 32.0–36.0)
MCV: 81.4 fL (ref 79.3–98.0)
MONO#: 0.8 10*3/uL (ref 0.1–0.9)
MONO%: 8.9 % (ref 0.0–14.0)
NEUT#: 7.5 10*3/uL — ABNORMAL HIGH (ref 1.5–6.5)
NEUT%: 79.3 % — ABNORMAL HIGH (ref 39.0–75.0)
Platelets: 88 10*3/uL — ABNORMAL LOW (ref 140–400)
RBC: 4.82 10*6/uL (ref 4.20–5.82)
RDW: 18.1 % — ABNORMAL HIGH (ref 11.0–14.6)
WBC: 9.5 10*3/uL (ref 4.0–10.3)

## 2014-03-06 NOTE — Progress Notes (Signed)
ID: [name]   DOB: 08/01/1936  MR#: [id number]  WUJ#:[id number]   PCP:  Leonard Downing, MD Other:  Peter Martinique, MD   HISTORY OF PRESENT ILLNESS:   Patient has a long-standing history of polycythemia vera, periodically requiring weekly therapeutic phlebotomy. This was diagnosed greater than 6 years ago. He also has multiple comorbidities which include COPD, multiple cardiac problems, history of DVT, and history of community-acquired pneumonia.   INTERVAL HISTORY:  Yu returns today for followup of his polycythemia vera. The interval history is significant for his having been admitted to the hospital for shortness of breath. The added amlodipine to his regimen and suggested he participate in the cardiac/pulmonary rehabilitation. He tells me he is taking "all the pills on the list" but cannot tell me how much amlodipine or the Hydrea cost. He says his niece takes care of all of that. He had his last phlebotomy 02/19/2014. Tolerated that well.  REVIEW OF SYSTEMS:  Aside from the shortness of breath, he has mild psoriasis issues and of course he bruises easily. He denies unusual headaches, visual changes, dizziness, nausea, vomiting, or gait imbalance. He denies cough, phlegm production, or pleurisy. There have been no fevers. He denies chest pain or pressure and is not aware of his defibrillator having gone off. A detailed review of systems was otherwise stable.  PAST MEDICAL HISTORY: Past Medical History  Diagnosis Date  . Polycythemia vera(238.4)     a. Used to receive chronic phlebotomies until 2007, at [facility].  will restart his phlebotomies from about Mar 15 2011  . Stomach ulcer   . Tobacco abuse     a. cigars  . Duodenal perforation June 2012  . Peritonitis June 2012  . Pneumonia April 2012  . Systolic CHF, chronic     a. 12/2011 Echo: EF 25-30%, mid-dist antsept/inf, apical AK, Gr 1 DD, Triv AI, Mild MR.  . Ischemic cardiomyopathy     a. 01/2012 S/P MDT  Protecta single lead ICD, ser # [id number]  . History of DVT (deep vein thrombosis)   . Chronic bronchitis   . CAD (coronary artery disease)     a. 07/2011 Anterior apical STEMI/Cath/PCI: LM nl, LAD 100p/m (2.5 x 35mm Mini-Vision BMS), LCX 40p, RCA dominant, nl, EF 30%.  Marland Kitchen GERD (gastroesophageal reflux disease)   . Myocardial infarction   . Shortness of breath   . Asthma   . COPD (chronic obstructive pulmonary disease)     PAST SURGICAL HISTORY: Past Surgical History  Procedure Laterality Date  . Cholecystectomy  03/2011    Dr. Marlou Starks  . Colon surgery    . Cardiac catheterization  Sept 2012    Normal left main, occluded LAD, 40% LCX and normal RCA. EF is 30%  . US echocardiography  Sept 2012    EF 25 to 30% with akinesis of the mid to distal anterior and apical myocardium, trivial AI and no apical thrombus  . Cardiac defibrillator placement      single chamber  . Shoulder arthroscopy      left, rotatotor cuff tendinopathy    FAMILY HISTORY Family History  Problem Relation Age of Onset  . Lung disease Father     SOCIAL HISTORY: His niece works for a [employer] and she helps with his medications.   ADVANCED DIRECTIVES: Living will in place  HEALTH MAINTENANCE: History  Substance Use Topics  . Smoking status: Former Smoker -- 0.50 packs/day for 60 years    Types: Cigars  Quit date: 05/18/2013  . Smokeless tobacco: Current User    Types: Chew     Comment: Smoked .5-1ppd of cigarettes from age 40 to a few yrs ago and has been smoking 10 small cigars daily since then.  . Alcohol Use: Yes     Comment: rarely    No Known Allergies  Current Outpatient Prescriptions  Medication Sig Dispense Refill  . albuterol (PROVENTIL HFA;VENTOLIN HFA) 108 (90 BASE) MCG/ACT inhaler Inhale 2 puffs into the lungs every 6 (six) hours as needed for wheezing or shortness of breath.  18 g  0  . amLODipine (NORVASC) 10 MG tablet Take 1 tablet (10 mg total) by mouth daily.  30 tablet  0  .  aspirin EC 81 MG tablet Take 81 mg by mouth daily.      . carvedilol (COREG) 25 MG tablet Take 25 mg by mouth 2 (two) times daily with a meal.      . furosemide (LASIX) 40 MG tablet Take 20 mg by mouth daily.       . hydroxyurea (HYDREA) 500 MG capsule Take 1 capsule (500 mg total) by mouth daily. May take with food to minimize GI side effects.  30 capsule  12  . lisinopril (PRINIVIL,ZESTRIL) 10 MG tablet Take 1 tablet (10 mg total) by mouth daily.  30 tablet  0  . lovastatin (MEVACOR) 20 MG tablet Take 20 mg by mouth at bedtime.      . mometasone-formoterol (DULERA) 100-5 MCG/ACT AERO Inhale 2 puffs into the lungs 2 (two) times daily.  1 Inhaler  0  . omeprazole (PRILOSEC) 20 MG capsule Take 20 mg by mouth daily.      . predniSONE (DELTASONE) 20 MG tablet Take 3 tablets (60 mg total) by mouth daily before breakfast.  12 tablet  0   No current facility-administered medications for this visit.    OBJECTIVE: Elderly white man in no acute distress  Filed Vitals:   03/06/14 1038  BP: 146/76  Pulse: 78  Temp: 98.2 F (36.8 C)  Resp: 20     Body mass index is 30.56 kg/(m^2).    ECOG FS: 2  Filed Weights   03/06/14 1038  Weight: 225 lb 6.4 oz (102.241 kg)   Sclerae unicteric, EOMs intact Oropharynx clear, full upper plate No cervical or supraclavicular adenopathy Lungs no rales or rhonchi Heart regular rate and rhythm Abd soft, nontender, positive bowel sounds MSK no focal spinal tenderness, no lymphedema Neuro: nonfocal,  hearty affect       LAB RESULTS: Lab Results  Component Value Date   WBC 11.0* 02/23/2014   NEUTROABS 5.4 02/21/2014   HGB 11.4* 02/23/2014   HCT 37.2* 02/23/2014   MCV 82.3 02/23/2014   PLT 173 02/23/2014      Chemistry      Component Value Date/Time   NA 138 02/23/2014 0416   NA 143 12/10/2013 1007   K 5.0 02/23/2014 0416   K 4.9 12/10/2013 1007   CL 103 02/23/2014 0416   CO2 27 02/23/2014 0416   CO2 28 12/10/2013 1007   BUN 32* 02/23/2014 0416   BUN 20.5  12/10/2013 1007   CREATININE 1.36* 02/23/2014 0416   CREATININE 1.3 12/10/2013 1007   CREATININE 1.57* 10/24/2012 1327      Component Value Date/Time   CALCIUM 9.1 02/23/2014 0416   CALCIUM 9.8 12/10/2013 1007   ALKPHOS 66 02/22/2014 0428   ALKPHOS 85 12/10/2013 1007   AST 11 02/22/2014 0428  AST 12 12/10/2013 1007   ALT 7 02/22/2014 0428   ALT 12 12/10/2013 1007   BILITOT 0.4 02/22/2014 0428   BILITOT 0.69 12/10/2013 1007        STUDIES: Dg Chest 2 View  02/21/2014   CLINICAL DATA:  Shortness of breath and chest congestion  EXAM: CHEST  2 VIEW  COMPARISON:  DG CHEST 2 VIEW dated 05/17/2013  FINDINGS: Mild cardiomegaly noted with prominent interstitial markings and trace pleural effusions. Left-sided pacer in place. Flattening of the hemidiaphragms suggests element of COPD. No acute osseous abnormality.  IMPRESSION: Findings suggest interstitial pulmonary edema with trace pleural effusions.   Electronically Signed   By: Conchita Paris M.D.   On: 02/21/2014 10:43   Ct Angio Chest Pe W/cm &/or Wo Cm  02/21/2014   CLINICAL DATA:  Shortness of breath, elevated D-dimer, dyspnea, history of deep venous thrombosis per report  EXAM: CT ANGIOGRAPHY CHEST WITH CONTRAST  TECHNIQUE: Multidetector CT imaging of the chest was performed using the standard protocol during bolus administration of intravenous contrast. Multiplanar CT image reconstructions and MIPs were obtained to evaluate the vascular anatomy.  CONTRAST:  175mL OMNIPAQUE IOHEXOL 350 MG/ML SOLN  COMPARISON:  DG CHEST 2 VIEW dated 02/21/2014; CT ANGIO CHEST W/CM &/OR WO/CM dated 10/12/2012  FINDINGS: Left-sided pacer in place. Superficial right chest wall vascular collaterals are noted. The examination is adequate for evaluation for acute pulmonary embolism up to and including the 3rd order pulmonary arteries. No focal filling defect is identified up to and including the 3rd order pulmonary arteries to suggest acute pulmonary embolism. Extrinsic to the right main  pulmonary artery and extending to the right hilum, is a 1.1 cm elliptical hypodense lesion measuring 33 Hounsfield units. Compared to the prior exam, this is new. Small hilar lymph nodes are otherwise stable bilaterally. Great vessels are normal in caliber.  Heart size is normal. Extensive atheromatous aortic calcification noted.  Calcified right pleural plaque with presumed adjacent rounded atelectasis/ scarring pre identified. Left adrenal diffuse enlargement is reidentified. Right upper renal pole cortical lobulation and possible subcentimeter cysts reidentified but not further characterized. Diffuse severe emphysematous changes are noted. Subpleural bibasilar scarring reidentified.  Review of the MIP images confirms the above findings.  IMPRESSION: No CT evidence for acute intrathoracic abnormality.  Specifically, no definite evidence for acute pulmonary embolism. Low-density elliptical shaped lesion adjacent to the right main pulmonary artery appears to produce extrinsic compression upon the artery, favoring loculated fluid within a pericardial recess or possibly reactive lymph node. Partially resorbed remote peripheral pulmonary arterial embolism is possible but felt less likely. Followup chest CT is recommended in 4-6 months with contrast for better assessment.  Emphysematous changes with evidence of asbestos exposure and right lower lobe rounded atelectasis.   Electronically Signed   By: Conchita Paris M.D.   On: 02/21/2014 13:24     ASSESSMENT: 78 y.o. [place] man with   (1)  a history of polycythemia vera treated with phlebotomy.  The goal is to phlebotomize once Mr. Gratz hematocrit is 50 or greater.  At that point, we phlebotomize until the hematocrit is at 40 or less.    (2)  Several comorbidities including community-acquired pneumonia involving the right lower lobe, history of recurrent deep vein thrombosis, history of tobacco abuse with chronic bronchitis, history of CVA and more  recently history of STEMI and CHF, s/p defibrillator placement   (3) hydroxyurea started February 2015  PLAN: Braxtin is doing fine from a polycythemia vera point  of view. He had his last phlebotomy 02/19/2014 and we are going to revert to routine followup at this point, with monthly labs and visits every 6 months. When his hematocrit rises above 45 we will consider reinstituting phlebotomy.  I have urged him to take his Hydrea. He says he is taking "everything that is on the list", but Hydrea generally causes the MCV to rise in his MCV is actually on the low side. I have written out the fact that if he takes that medication we will not have to phlebotomize him as frequently.  He was referred to cardiovascular rehabilitation apparently he never made it or the referral was not appropriately placed. I urged him to followup with her with his cardiologist.   Delfino Lovett has a good understanding of the overall plan. He agrees with that. He knows a goal of treatment in this case is control. He will call with any problems that may develop before his next visit here.   Virgie Dad Antoinett Dorman    03/06/2014

## 2014-03-12 ENCOUNTER — Telehealth: Payer: Self-pay | Admitting: Oncology

## 2014-03-12 ENCOUNTER — Encounter: Payer: Self-pay | Admitting: Cardiology

## 2014-03-12 NOTE — Telephone Encounter (Signed)
S/w the pt and he is aware of his lab appt this month. Pt is aware to pick up the rest of his appt schedules at that time.

## 2014-03-28 ENCOUNTER — Other Ambulatory Visit (HOSPITAL_COMMUNITY): Payer: Self-pay

## 2014-03-28 DIAGNOSIS — J441 Chronic obstructive pulmonary disease with (acute) exacerbation: Secondary | ICD-10-CM

## 2014-04-03 ENCOUNTER — Ambulatory Visit (HOSPITAL_COMMUNITY)
Admission: RE | Admit: 2014-04-03 | Discharge: 2014-04-03 | Disposition: A | Discharge status: Home / Self Care | Payer: Medicare Other | Source: Ambulatory Visit | Attending: Family Medicine | Admitting: Family Medicine

## 2014-04-03 DIAGNOSIS — J441 Chronic obstructive pulmonary disease with (acute) exacerbation: Secondary | ICD-10-CM | POA: Insufficient documentation

## 2014-04-03 MED ORDER — ALBUTEROL SULFATE (2.5 MG/3ML) 0.083% IN NEBU
2.5000 mg | INHALATION_SOLUTION | Freq: Once | RESPIRATORY_TRACT | Status: AC
  Administered 2014-04-03: 2.5 mg via RESPIRATORY_TRACT

## 2014-04-04 ENCOUNTER — Other Ambulatory Visit: Payer: Medicare Other

## 2014-04-17 ENCOUNTER — Telehealth (HOSPITAL_COMMUNITY): Payer: Self-pay | Admitting: *Deleted

## 2014-04-24 ENCOUNTER — Telehealth (HOSPITAL_COMMUNITY): Payer: Self-pay | Admitting: *Deleted

## 2014-04-24 LAB — PULMONARY FUNCTION TEST
DL/VA % PRED: 30 %
DL/VA: 1.45 ml/min/mmHg/L
DLCO COR % PRED: 19 %
DLCO COR: 6.86 ml/min/mmHg
DLCO unc % pred: 19 %
DLCO unc: 6.86 ml/min/mmHg
FEF 25-75 POST: 0.46 L/s
FEF 25-75 PRE: 0.38 L/s
FEF2575-%CHANGE-POST: 20 %
FEF2575-%PRED-POST: 19 %
FEF2575-%PRED-PRE: 15 %
FEV1-%CHANGE-POST: 8 %
FEV1-%Pred-Post: 29 %
FEV1-%Pred-Pre: 27 %
FEV1-PRE: 0.9 L
FEV1-Post: 0.97 L
FEV1FVC-%Change-Post: -2 %
FEV1FVC-%PRED-PRE: 53 %
FEV6-%CHANGE-POST: 7 %
FEV6-%PRED-POST: 57 %
FEV6-%Pred-Pre: 52 %
FEV6-Post: 2.44 L
FEV6-Pre: 2.26 L
FEV6FVC-%Change-Post: -2 %
FEV6FVC-%Pred-Post: 99 %
FEV6FVC-%Pred-Pre: 102 %
FVC-%Change-Post: 10 %
FVC-%PRED-POST: 57 %
FVC-%Pred-Pre: 51 %
FVC-POST: 2.6 L
FVC-Pre: 2.35 L
POST FEV1/FVC RATIO: 37 %
Post FEV6/FVC ratio: 94 %
Pre FEV1/FVC ratio: 38 %
Pre FEV6/FVC Ratio: 96 %
RV % pred: 118 %
RV: 3.17 L
TLC % pred: 85 %
TLC: 6.4 L

## 2014-05-02 ENCOUNTER — Other Ambulatory Visit: Payer: Medicare Other

## 2014-05-05 ENCOUNTER — Encounter (HOSPITAL_COMMUNITY)
Admission: RE | Admit: 2014-05-05 | Discharge: 2014-05-05 | Disposition: A | Discharge status: Home / Self Care | Payer: Medicare Other | Source: Ambulatory Visit | Attending: Family Medicine | Admitting: Family Medicine

## 2014-05-05 ENCOUNTER — Encounter (HOSPITAL_COMMUNITY): Payer: Self-pay

## 2014-05-05 VITALS — BP 112/66 | HR 71 | Resp 20 | Ht 72.75 in | Wt 225.3 lb

## 2014-05-05 DIAGNOSIS — J449 Chronic obstructive pulmonary disease, unspecified: Secondary | ICD-10-CM

## 2014-05-05 NOTE — Progress Notes (Addendum)
Jesus Sanders 78 y.o. male Pulmonary Rehab Orientation Note Patient arrived today in Cardiac and Pulmonary Rehab for orientation to Pulmonary Rehab. He was transported from [employer] via wheel chair. He does not carry portable oxygen. Per pt, he uses oxygen never. Patient does state he was prescribed oxygen PRN several years ago, but never used it so he called the company to come and pick it up. Color good, skin warm and dry. Patient is oriented to time and place. Patient's medical history and medications reviewed. Heart rate is normal, breath sounds clear to auscultation, no wheezes, rales, or rhonchi, mild-moderate wheezing with forced expiration heard diffusely throughout both lungs. Grip strength equal, strong. Distal pulses palpable. Mild pitting edema noted bilat to lower ext. Patient reports he does take medications as prescribed. Patient states he follows a Low fat, Low Sodium. The patient reports no specific efforts to gain or lose weight.. Patient's weight will be monitored closely. Demonstration and practice of PLB using pulse oximeter. Patient able to return demonstration satisfactorily. Safety and hand hygiene in the exercise area reviewed with patient. Patient voices understanding of the information reviewed. Department expectations discussed with patient and achievable goals were set. The patient shows enthusiasm about attending the program and we look forward to working with this nice gentleman. The patient is scheduled for a 6 min walk test tomorrow, 6/30 at 3:30 and to begin exercise on Thursday, 7/2.   45 minutes was spent on a variety of activities such as assessment of the patient, obtaining baseline data including height, weight, BMI, and grip strength, verifying medical history, allergies, and current medications, and teaching patient strategies for performing tasks with less respiratory effort with emphasis on pursed lip breathing.

## 2014-05-05 NOTE — Progress Notes (Signed)
Jesus Sanders 78 y.o. male  Initial Psychosocial Assessment  Pt psychosocial assessment reveals pt lives alone. Pt is currently retired. Pt hobbies include playing games on his computer and "facebooking". Patients states he loves being outside and would love to have an outside hobby but is limited because of his COPD.Pt reports his stress level is low. Areas of stress/anxiety include Health.  Pt does not exhibit signs of depression.  Pt shows good  coping skills with positive outlook . Jesus Sanders was offered emotional support and reassurance. We will monitor and evaluate progress toward psychosocial goal(s).  Goal(s): Help patient work toward returning to meaningful activities that improve patient's QOL and are attainable with patient's lung disease. He would really like to be able to be more active outdoors and especially to be able to walk to the mailbox without being so short of breath.   05/05/2014 11:23 AM

## 2014-05-06 ENCOUNTER — Encounter (HOSPITAL_COMMUNITY)
Admission: RE | Admit: 2014-05-06 | Discharge: 2014-05-06 | Disposition: A | Discharge status: Home / Self Care | Payer: Medicare Other | Source: Ambulatory Visit | Attending: Family Medicine | Admitting: Family Medicine

## 2014-05-06 ENCOUNTER — Telehealth (HOSPITAL_COMMUNITY): Payer: Self-pay

## 2014-05-06 NOTE — Progress Notes (Signed)
Jesus Sanders completed a Six-Minute Walk Test on 05/06/14 . Jesus Sanders walked 770 feet with 4 breaks lasting 2 minutes and 10 seconds total.  The patient's lowest oxygen saturation was 85% which he then was stopped until his oxygen saturation climbed back up above 90% , highest heart rate was 97 , and highest blood pressure was 104/60. The patient was on 3 liters of oxygen with a nasal cannula. Jesus Sanders stated that lightheadedness hindered their walk test.  The patient stated that he was not breathless athough by looking at him he seemed very short of breath.  The patient currently does not have a Pulmonologist and was advised to contact [facility] to get set up with one.

## 2014-05-08 ENCOUNTER — Encounter (HOSPITAL_COMMUNITY)
Admission: RE | Admit: 2014-05-08 | Discharge: 2014-05-08 | Disposition: A | Discharge status: Home / Self Care | Payer: Medicare Other | Source: Ambulatory Visit | Attending: Family Medicine | Admitting: Family Medicine

## 2014-05-08 DIAGNOSIS — I2589 Other forms of chronic ischemic heart disease: Secondary | ICD-10-CM | POA: Diagnosis not present

## 2014-05-08 DIAGNOSIS — J9620 Acute and chronic respiratory failure, unspecified whether with hypoxia or hypercapnia: Secondary | ICD-10-CM | POA: Insufficient documentation

## 2014-05-08 DIAGNOSIS — J441 Chronic obstructive pulmonary disease with (acute) exacerbation: Secondary | ICD-10-CM | POA: Diagnosis not present

## 2014-05-08 DIAGNOSIS — I10 Essential (primary) hypertension: Secondary | ICD-10-CM | POA: Diagnosis not present

## 2014-05-08 DIAGNOSIS — K219 Gastro-esophageal reflux disease without esophagitis: Secondary | ICD-10-CM | POA: Insufficient documentation

## 2014-05-08 DIAGNOSIS — Z5189 Encounter for other specified aftercare: Secondary | ICD-10-CM | POA: Insufficient documentation

## 2014-05-08 DIAGNOSIS — F17200 Nicotine dependence, unspecified, uncomplicated: Secondary | ICD-10-CM | POA: Insufficient documentation

## 2014-05-08 DIAGNOSIS — D45 Polycythemia vera: Secondary | ICD-10-CM | POA: Diagnosis not present

## 2014-05-08 NOTE — Progress Notes (Signed)
Today, Jesus Sanders exercised at [employer]. Service time was from 10:30am to 12:30pm.  The patient exercised for more than 31 minutes performing aerobic, strengthening, and stretching exercises. Oxygen saturation, heart rate, blood pressure, rate of perceived exertion, and shortness of breath were all monitored before, during, and after exercise. [name] presented with no problems at today's exercise session. Today the patient attended the education class "Oxygen Use and Safety" with Trish Fountain.  There was no workload change during today's exercise session.  Pre-exercise vitals:   Weight kg: 100.8   Liters of O2: ra and then placed on 2l   SpO2: 87   HR: 68   BP: 128/77   CBG: na  Exercise vitals:   Highest heartrate:  76   Lowest oxygen saturation: 94   Highest blood pressure: 102/60   Liters of 02: 3  Post-exercise vitals:   SpO2: 96   HR: 68   BP: 104/58   Liters of O2: 3   CBG: na  Dr. Brand Males, Medical Director Dr. Dyann Kief is immediately available during today's Pulmonary Rehab session for [name] on 05/08/14 at 10:30am class time.

## 2014-05-13 ENCOUNTER — Encounter (HOSPITAL_COMMUNITY)
Admission: RE | Admit: 2014-05-13 | Discharge: 2014-05-13 | Disposition: A | Discharge status: Home / Self Care | Payer: Medicare Other | Source: Ambulatory Visit | Attending: Family Medicine | Admitting: Family Medicine

## 2014-05-13 ENCOUNTER — Encounter (HOSPITAL_COMMUNITY): Payer: Self-pay

## 2014-05-13 DIAGNOSIS — Z5189 Encounter for other specified aftercare: Secondary | ICD-10-CM | POA: Diagnosis not present

## 2014-05-13 NOTE — Progress Notes (Signed)
Today, Drayk exercised at [employer]. Service time was from 10:30 to 12:15.  The patient exercised for more than 31 minutes performing aerobic, strengthening, and stretching exercises. Oxygen saturation, heart rate, blood pressure, rate of perceived exertion, and shortness of breath were all monitored before, during, and after exercise. Jesus Sanders presented with no problems at today's exercise session.   There was NO workload change during today's exercise session.  Pre-exercise vitals:   Weight kg: 99.3   Liters of O2: RA   SpO2: 92   HR: 74   BP: 96/50   CBG: NA  Exercise vitals:   Highest heartrate:  94   Lowest oxygen saturation: 93   Highest blood pressure: 110/62   Liters of 02: 3  Post-exercise vitals:   SpO2: 97   HR: 74   BP: 92/60 recheck: 122/79   Liters of O2: 3   CBG: na  Dr. Brand Males, Medical Director Dr. Dyann Kief is immediately available during today's Pulmonary Rehab session for [name] on 05/13/14 at 10:30am class time.

## 2014-05-13 NOTE — Outcomes Assessment (Signed)
[facility] Pulmonary Rehabilitation Baseline Outcomes Assessment   Anthropometrics:    Height (inches): 72.75   Weight (kg): 102.2   Grip strength was measured using a Dynamometer.  The patient's highest score was a 45.  Functional Status/Exercise Capacity:   Jesus Sanders had a resting heart rate of 71 BPM, a resting blood pressure of 112/66, and an oxygen saturation of 91 % on 0 liters of O2.  Jesus Sanders performed a 6-minute walk test on 05/06/14.  The patient completed 770 feet in 6 minutes with 4 rest breaks.  This quantifies 2.15 METS.   Dyspnea Measures:   The Jesus Sanders is a simple and standardized method of classifying disability in patients with COPD.  The assessment correlates disability and dyspnea.  At entrance the patient scored a 4. The scale is provided below.   0= I only get breathless with strenuous exercise. 1= I get short of breath when hurrying on level ground or walking up a slight incline. 2= On level ground, I walk slower than people of the same age because of breathlessness, or have to stop for breath when walking at my own pace. 3= I stop for breath after walking 100 yards or after a few minutes on level ground. 4=I am too breathless to leave the house or I am breathless when dressing.     The patient completed the Jesus ([employer] [facility]).  This questionnaire relates activities of daily living and shortness of breath.  The score ranges from 0-120, a higher score relates to severe shortness of breath during activities of daily living. The patient's score at entrance was 89.  Quality of Life:   Jesus Sanders is used to assess the patients satisfaction in different domains of their life; health and functioning, socioeconomic, psychological/spiritual, and family. The overall score is recorded out of 30 points.  The patient's goal is to achieve an overall score of  21 or higher.  Jesus Sanders received a 21.83 at entrance.    The Patient Health Questionnaire (PHQ-2) is a first step approach for the screening of depression.  If the patient scores positive on the PHQ-2 the patient should be further assessed with the PHQ-9.  The Patient Health Questionnaire (PHQ-9) assesses the degree of depression.  Depression is important to monitor and track in pulmonary patients due to its prevalence in the population.  If the patient advances to the PHQ-9 the goal is to score less than 4 on this assessment.  Jesus Sanders scored a 0 at entrance.  Clinical Assessment Tools:   The COPD Assessment Test (CAT) is a measurement tool to quantify how much of an impact the disease has on the patient's life.  This assessment aids the Pulmonary Rehab Team in designing the patients individualized treatment plan.  A CAT score ranges from 0-40.  A score of 10 or below indicates that COPD has a low impact on the patient's life whereas a score of 30 or higher indicates a severe impact. The patient's goal is a decrease of 1 point from entrance to discharge.  Jesus Sanders had a CAT score of 18 at entrance.  Nutrition:   The "Rate My Plate" is a dietary assessment that quantifies the balance of a patient's diet.  This tool allows the Pulmonary Rehab Team to key in on the areas of the patient's diet that needs improving.  The team can then focus their nutritional education on those areas.  If the patient scores 24-40, this means there are many ways they can make their eating habits healthier, 41-57 states that there are some ways they can make their eating habits healthier and a score of 58-72 states that they are making many healthy choices.  The patient's goal is to achieve a score of 49 or higher on this assessment.  Jesus Sanders scored a 49 at entrance.  Oxygen Compliance:   Patient is currently on 0 liters at rest, 0 liters at night, and 3 liters for exercise.  Jesus Sanders is not currently using  cpap.  The patient is  currently compliant  The patient states that they do not have barriers that keep them from using their oxygen.    Education:   Jesus Sanders will attend education classes during the course of Pulmonary Rehab.  Education classes that will be offered to the patient are Activities of Daily Living and Energy Conservation, Pursed Lip Breathing and Diaphragmatic Breathing, Nutrition, Exercise for the Pulmonary Patient, Warning Signs of Infection, Chronic Lung Disease, Advanced Directives, Medications, and Stress and Meditation.  The patient completed an assessment at the entrance of the program and will complete it again upon discharge to demonstrate the level of understanding provided by the educational classes.  This assessment includes 14 questions regarding all of the education topics above.  Jesus Sanders achieved a score of 6/14 at entrance.  Smoking Cessation:  The patient is not currently smoking.  Exercise:   Jesus Sanders will be provided with an individualized Home Exercise Prescription (HEP) at the entrance of the program.  The patient will be followed by the Pulmonary Exercise Physiologist throughout the program to assist with the progression of the frequency, intensity, time, and type of exercise. The patient's long-term goal is to be exercising 30-60 minutes, 3-5 days per week. At entrance, the patient was exercising 0 days at home.

## 2014-05-15 ENCOUNTER — Encounter (HOSPITAL_COMMUNITY)
Admission: RE | Admit: 2014-05-15 | Discharge: 2014-05-15 | Disposition: A | Discharge status: Home / Self Care | Payer: Medicare Other | Source: Ambulatory Visit | Attending: Family Medicine | Admitting: Family Medicine

## 2014-05-15 DIAGNOSIS — Z5189 Encounter for other specified aftercare: Secondary | ICD-10-CM | POA: Diagnosis not present

## 2014-05-15 NOTE — Progress Notes (Signed)
Today, Jesus Sanders exercised at [employer]. Service time was from 1100to 1230.  The patient exercised for more than 31 minutes performing aerobic, strengthening, and stretching exercises. Oxygen saturation, heart rate, blood pressure, rate of perceived exertion, and shortness of breath were all monitored before, during, and after exercise. Jesus Sanders presented with no problems at today's exercise session.  Patient attended M.D. lecture regarding pulmonary diseases.  There was no workload change during today's exercise session.  Pre-exercise vitals:   Weight kg: 99.3   Liters of O2: RA   SpO2: 92   HR: 70   BP: 100/60   CBG: NA  Exercise vitals:   Highest heartrate:  79   Lowest oxygen saturation: 94   Highest blood pressure: 112/60   Liters of 02: 3  Post-exercise vitals:   SpO2: 99   HR: 77   BP: 116/62   Liters of O2: 3   CBG: NA Dr. Brand Males, Medical Director Dr. Aileen Fass is immediately available during today's Pulmonary Rehab session for Jesus Sanders on 05/15/2014 at 1030 class time.    [place]

## 2014-05-16 ENCOUNTER — Ambulatory Visit (INDEPENDENT_AMBULATORY_CARE_PROVIDER_SITE_OTHER): Payer: Medicare Other | Admitting: Internal Medicine

## 2014-05-16 ENCOUNTER — Encounter: Payer: Self-pay | Admitting: Internal Medicine

## 2014-05-16 VITALS — BP 108/64 | HR 64 | Temp 97.8°F | Ht 72.0 in | Wt 222.8 lb

## 2014-05-16 DIAGNOSIS — I10 Essential (primary) hypertension: Secondary | ICD-10-CM

## 2014-05-16 DIAGNOSIS — F17200 Nicotine dependence, unspecified, uncomplicated: Secondary | ICD-10-CM

## 2014-05-16 DIAGNOSIS — J449 Chronic obstructive pulmonary disease, unspecified: Secondary | ICD-10-CM

## 2014-05-16 DIAGNOSIS — I2510 Atherosclerotic heart disease of native coronary artery without angina pectoris: Secondary | ICD-10-CM

## 2014-05-16 DIAGNOSIS — R0602 Shortness of breath: Secondary | ICD-10-CM

## 2014-05-16 MED ORDER — VALSARTAN 80 MG PO TABS: 80.0000 mg | ORAL_TABLET | Freq: Every day | ORAL | Status: DC

## 2014-05-16 NOTE — Progress Notes (Addendum)
Subjective:    Patient ID: Jesus Sanders, male    DOB: [birth year]   MRN: [id number]  HPI  9 yowm quit smoking 2014 admitted to [facility]   Admit date: 02/21/2014  Discharge date: 02/23/2014    Recommendations for Outpatient Follow-up:  1. New amlodipine added 2. Rec Cardio-piulm rehab, Pulmonology referral Discharge Diagnoses:  Acute-on-chronic respiratory failure   Polycythemia vera(238.4)  HTN (hypertension)  Ischemic cardiomyopathy  GERD (gastroesophageal reflux disease)  COPD exacerbation, likely  COPD with acute exacerbation  Discharge Condition: good  Diet recommendation: reg  Filed Weights    02/21/14 1945  02/22/14 0502   Weight:  100.8 kg (222 lb 3.6 oz)  100.6 kg (221 lb 12.5 oz)   History of present illness:  78 y.o. male came to [facility] ed 02/21/2014 with shortness of breath. No history of polycythemia vera, CAD history 07/2011 anterior apical MI S./P. BMS to LAD resulting in ischemic cardiomyopathy with systolic CHF [systolic 83%] s/p defibrillator 01/2012, severe COPD FEV1/FVC 0.40, H/o DVt in 2001 and [place] to have AECOPD and admitted for the same  Hospital Course:  Acute-on-chronic respiratory failure-likely AECOPD-treat with inhaled nebulizers every 4 when necessary, add steroids to 60 mg prednisone . Patient educated regarding inhalers as he is only on albuterol which is a rescue inhaler but he thought that his nebulizer was to be used Q6 when necessary and inhaler was a controller med  RT to teach use of proper use of inhaler and when to use what  I will prescribe for him Advair forCOPD Gold stage IV at least . He will need a burst of steroids for about 5-7 days and anticipate he will do well.might benefit from Cardio-pulm rehab  Polycythemia vera(238.4) currently with anemia presently-currently on Hydrea and gets prophylactic phlebotomy. To keep his hematocrit less than 40 which is clearly not at that . hydroxyurea was restarted February 2015. He will followup  with his outpatient oncologist when needed  HTN (hypertension)-continue the medications as below. Blood pressure only moderately controlled currently. Increase Lisinopril or add Amlodpine if still elevated in am  Ischemic cardiomyopathy, CAD history , placement of defibrillator 2012-keep on telemetry. Currently doing well -may benefit from alternative agent other than Coreg 25 mg as has COPD . Continue ACE inhibitor lisinopril 10 daily , continue aspirin 81 mg daily  GERD (gastroesophageal reflux disease) continue Protonix in place of Prilosec  History of DVT 2001-completed therapy with Coumadin  History CVA-stable currently  acute kidney injury BN/creatinine 25/1.24. Labs about the same monitor in the am. Lasix held initially but re-started. Will need close monitoring of bmet as OP  Hyperlipidemia-hold statin for now   05/16/2014 1st [facility] office visit/ Jesus Sanders  Chief Complaint  Patient presents with  . Pulmoanary Consult    Pt states that he was referred per Cardiac Rehab.  He c/o DOE for "I don't know, several years".  Occurs when "I do too much".     indolent onset doe x sev years to point where has trouble to mb and back flat has to stop half way back but not every day Also has it sitting still  Also has it also sometimes lying down Can do all but the most rigorous ex at rehab s 02  Better on dulera 100 2bid but not sure of what all meds he's using.   No obvious other patterns in day to day or daytime variabilty or assoc chronic cough or cp or chest tightness, subjective  wheeze overt sinus or hb symptoms. No unusual exp hx or h/o childhood pna/ asthma or knowledge of premature birth.  Sleeping ok without nocturnal  or early am exacerbation  of respiratory  c/o's or need for noct saba. Also denies any obvious fluctuation of symptoms with weather or environmental changes or other aggravating or alleviating factors except as outlined above   Current Medications, Allergies, Complete  Past Medical History, Past Surgical History, Family History, and Social History were reviewed in [employer] record.            Review of Systems  Constitutional: Negative for fever, chills, activity change, appetite change and unexpected weight change.  HENT: Negative for congestion, dental problem, postnasal drip, rhinorrhea, sneezing, sore throat, trouble swallowing and voice change.   Eyes: Negative for visual disturbance.  Respiratory: Positive for shortness of breath. Negative for cough and choking.   Cardiovascular: Negative for chest pain and leg swelling.  Gastrointestinal: Negative for nausea, vomiting and abdominal pain.  Genitourinary: Negative for difficulty urinating.  Musculoskeletal: Negative for arthralgias.  Skin: Negative for rash.  Psychiatric/Behavioral: Negative for behavioral problems and confusion.       Objective:   Physical Exam  Wt Readings from Last 3 Encounters:  05/16/14 222 lb 12.8 oz (101.061 kg)  05/05/14 225 lb 5 oz (102.2 kg)  03/06/14 225 lb 6.4 oz (102.241 kg)   cantankerous wm whofailed to answer a single question asked in a straightforward manner, tending to go off on tangents or answer questions with ambiguous medical terms or diagnoses and seemed aggravated  when asked the same question more than once for clarification.    HEENT mild turbinate edema.  Oropharynx no thrush or excess pnd or cobblestoning.  No JVD or cervical adenopathy. Mild accessory muscle hypertrophy. Trachea midline, nl thryroid. Chest was hyperinflated by percussion with diminished breath sounds and moderate increased exp time without wheeze. Hoover sign positive at mid inspiration. Regular rate and rhythm without murmur gallop or rub or increase P2 or edema.  Abd: no hsm, nl excursion. Ext warm without cyanosis or clubbing.    CT chest 02/21/14 No CT evidence for acute intrathoracic abnormality.  Specifically, no definite evidence for acute pulmonary  embolism.  Low-density elliptical shaped lesion adjacent to the right main  pulmonary artery appears to produce extrinsic compression upon the  artery, favoring loculated fluid within a pericardial recess or  possibly reactive lymph node. Partially resorbed remote peripheral  pulmonary arterial embolism is possible but felt less likely.   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

## 2014-05-16 NOTE — Patient Instructions (Addendum)
Stop lisinopril and take diovan (valsartan 80 mg daily)   See Tammy NP w/in 2 weeks with all your medications and your pill boxes and your list, even over the counter meds, separated in two separate bags, the ones you take no matter what vs the ones you stop once you feel better and take only as needed when you feel you need them.   Tammy  will generate for you a new user friendly medication calendar that will put [name] all on the same page re: your medication use.     Without this process, it simply isn't possible to assure that we are providing  your outpatient care  with  the attention to detail we feel you deserve.   If we cannot assure that you're getting that kind of care,  then we cannot manage your problem effectively from this clinic.  Once you have seen Tammy and we are sure that we're all on the same page with your medication use she will arrange follow up with me.

## 2014-05-17 DIAGNOSIS — J439 Emphysema, unspecified: Secondary | ICD-10-CM | POA: Insufficient documentation

## 2014-05-17 DIAGNOSIS — F17200 Nicotine dependence, unspecified, uncomplicated: Secondary | ICD-10-CM | POA: Insufficient documentation

## 2014-05-17 NOTE — Assessment & Plan Note (Signed)

## 2014-05-17 NOTE — Assessment & Plan Note (Signed)

## 2014-05-17 NOTE — Assessment & Plan Note (Addendum)
PFT's 04/03/14 FEV1  0.90 (27%) ratio 38 and DLCO 19% corrects to 30   F/v physiologic - spirometry 05/17/14  FEV1  0.91 (26%) ratio 42 but f/v non physiologic  DDX of  difficult airways management all start with A and  include Adherence, Ace Inhibitors, Acid Reflux, Active Sinus Disease, Alpha 1 Antitripsin deficiency, Anxiety masquerading as Airways dz,  ABPA,  allergy(esp in young), Aspiration (esp in elderly), Adverse effects of DPI,  Active smokers, plus two Bs  = Bronchiectasis and Beta blocker use..and one C= CHF   Adherence is always the initial "prime suspect" and is a multilayered concern that requires a "trust but verify" approach in every patient - starting with knowing how to use medications, especially inhalers, correctly, keeping up with refills and understanding the fundamental difference between maintenance and prns vs those medications only taken for a very short course and then stopped and not refilled.  -  To keep things simple, I have asked the patient to first separate medicines that are perceived as maintenance, that is to be taken daily "no matter what", from those medicines that are taken on only on an as-needed basis and I have given the patient examples of both, and then return to see our NP to generate a  detailed  medication calendar which should be followed until the next physician sees the patient and updates it.    Active smoking > discussed separately   ACEi  Very problematic given his variable sob at rest > needs trial off > see HBP  ? Bb effect > Strongly prefer in this setting: Bystolic, the most beta -1  selective Beta blocker available in sample form, with bisoprolol the most selective generic choice  on the market.   ? chf > on return needs BNP and consideration for bisoprolol trial once do full med reconciliation   See instructions for specific recommendations which were reviewed directly with the patient who was given a copy with highlighter outlining the key  components.

## 2014-05-20 ENCOUNTER — Encounter (HOSPITAL_COMMUNITY): Payer: Medicare Other

## 2014-05-22 ENCOUNTER — Encounter (HOSPITAL_COMMUNITY)
Admission: RE | Admit: 2014-05-22 | Discharge: 2014-05-22 | Disposition: A | Discharge status: Home / Self Care | Payer: Medicare Other | Source: Ambulatory Visit | Attending: Family Medicine | Admitting: Family Medicine

## 2014-05-22 DIAGNOSIS — Z5189 Encounter for other specified aftercare: Secondary | ICD-10-CM | POA: Diagnosis not present

## 2014-05-22 NOTE — Progress Notes (Signed)
Today, Jesus Sanders exercised at [employer] Jesus Sanders. Service time was from 1030 to 1230.  The patient exercised for more than 31 minutes performing aerobic, strengthening, and stretching exercises. Oxygen saturation, heart rate, blood pressure, rate of perceived exertion, and shortness of breath were all monitored before, during, and after exercise. Jesus Sanders presented with no problems at today's exercise session.  Patient attended the nutrition for the pulmonary patient class today.   There was no workload change during today's exercise session.  Pre-exercise vitals:   Weight kg: 100.6   Liters of O2: RA   SpO2: 93   HR: 83   BP: 134/74   CBG: NA  Exercise vitals:   Highest heartrate:  82   Lowest oxygen saturation: 96   Highest blood pressure: 134/64   Liters of 02: 3  Post-exercise vitals:   SpO2: 97   HR: 78   BP: 122/60   Liters of O2: 3   CBG: NA Dr. Brand Males, Medical Director Dr. Aileen Fass is immediately available during today's Pulmonary Sanders session for Jesus Sanders on 05/22/2014 at 1030 class time.

## 2014-05-27 ENCOUNTER — Encounter (HOSPITAL_COMMUNITY): Payer: Medicare Other

## 2014-05-29 ENCOUNTER — Encounter (HOSPITAL_COMMUNITY)
Admission: RE | Admit: 2014-05-29 | Discharge: 2014-05-29 | Disposition: A | Discharge status: Home / Self Care | Payer: Medicare Other | Source: Ambulatory Visit | Attending: Family Medicine | Admitting: Family Medicine

## 2014-05-29 DIAGNOSIS — Z5189 Encounter for other specified aftercare: Secondary | ICD-10-CM | POA: Diagnosis not present

## 2014-05-29 NOTE — Progress Notes (Signed)
Today, Jesus Sanders exercised at [employer]. Service time was from 10:30 to 12:30.  The patient exercised for more than 31 minutes performing aerobic, strengthening, and stretching exercises. Oxygen saturation, heart rate, blood pressure, rate of perceived exertion, and shortness of breath were all monitored before, during, and after exercise. Jesus Sanders presented with no problems at today's exercise session. The patient attended Exercise for the Pulmonary Patient education class today.  There was no workload change during today's exercise session.  Pre-exercise vitals:   Weight kg: 101.5   Liters of O2: 3   SpO2: 92   HR: 65   BP: 94/60   CBG: na  Exercise vitals:   Highest heartrate:  75   Lowest oxygen saturation: 93   Highest blood pressure: 120/72   Liters of 02: 3  Post-exercise vitals:   SpO2: 97   HR: 73   BP: 124/64   Liters of O2: 3   CBG: na  Dr. Brand Males, Medical Director Dr. Carles Collet is immediately available during today's Pulmonary Rehab session for Jesus Sanders on 05/29/14 at 10:30am class time.

## 2014-05-29 NOTE — Psychosocial Assessment (Signed)
Jesus Sanders 78 y.o. male  30 day Psychosocial Note  Patient psychosocial assessment reveals barriers to participation in Pulmonary Rehab. Psychosocial areas that are currently affecting patient's rehab experience include his inability to enjoy physical outdoor activities. 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  Patient does continue to exhibit positive coping skills to deal with his psychosocial concerns, however he does warrant close following. Offered emotional support and reassurance. Patient does not feel he is  making progress toward Pulmonary Rehab goals. He verbalized that he was not getting better. Reassured patient that pulmonary rehab would help increase his stamina and teach him how to manage the symptoms of his pulmonary disease. Reminded patient to use the pursed lip breathing technique he has been taught to manage his symptom of SOB. Patient reports his health and activity level has not improved in the past 30 days as evidenced by patient's report of decreased ability to remain active. Patient states he has less energy over the last 2 weeks, and my attribute it to a change in medication. Patient states he feels as if he could stay in bed all day, but verbalized this would not be the best course of action. Patient states family/friends have not noticed changes in his activity or mood. Patient reports not feeling positive about current and projected progression in Pulmonary Rehab. After reviewing the patient's treatment plan, the patient is not making progress toward Pulmonary Rehab goals. Patient's rate of progress toward rehab goals is fair. Plan of action to help patient continue to work towards rehab goals include increasing workloads as tolerated on equipment in order to increase stamina and stregnth. Will continue to monitor and evaluate progress toward psychosocial goal(s).  Goal(s) in  progress: Help patient work toward returning to meaningful activities that improve patient's QOL and are attainable with patient's lung disease

## 2014-05-30 ENCOUNTER — Other Ambulatory Visit (HOSPITAL_BASED_OUTPATIENT_CLINIC_OR_DEPARTMENT_OTHER): Payer: Medicare Other

## 2014-05-30 DIAGNOSIS — D45 Polycythemia vera: Secondary | ICD-10-CM

## 2014-05-30 LAB — CBC WITH DIFFERENTIAL/PLATELET
BASO%: 0.4 % (ref 0.0–2.0)
Basophils Absolute: 0 10*3/uL (ref 0.0–0.1)
EOS%: 3.1 % (ref 0.0–7.0)
Eosinophils Absolute: 0.2 10*3/uL (ref 0.0–0.5)
HCT: 45.4 % (ref 38.4–49.9)
HGB: 13.7 g/dL (ref 13.0–17.1)
LYMPH#: 1.4 10*3/uL (ref 0.9–3.3)
LYMPH%: 19 % (ref 14.0–49.0)
MCH: 26 pg — ABNORMAL LOW (ref 27.2–33.4)
MCHC: 30.2 g/dL — AB (ref 32.0–36.0)
MCV: 86.1 fL (ref 79.3–98.0)
MONO#: 0.7 10*3/uL (ref 0.1–0.9)
MONO%: 9.2 % (ref 0.0–14.0)
NEUT#: 4.9 10*3/uL (ref 1.5–6.5)
NEUT%: 68.3 % (ref 39.0–75.0)
NRBC: 0 % (ref 0–0)
Platelets: 95 10*3/uL — ABNORMAL LOW (ref 140–400)
RBC: 5.27 10*6/uL (ref 4.20–5.82)
RDW: 17.8 % — ABNORMAL HIGH (ref 11.0–14.6)
WBC: 7.2 10*3/uL (ref 4.0–10.3)

## 2014-06-02 ENCOUNTER — Encounter: Payer: Self-pay | Admitting: Adult Health

## 2014-06-02 ENCOUNTER — Other Ambulatory Visit (HOSPITAL_COMMUNITY): Payer: Self-pay | Admitting: Internal Medicine

## 2014-06-02 ENCOUNTER — Ambulatory Visit (INDEPENDENT_AMBULATORY_CARE_PROVIDER_SITE_OTHER): Payer: Medicare Other | Admitting: Adult Health

## 2014-06-02 VITALS — BP 116/74 | HR 76 | Temp 97.6°F | Ht 72.0 in | Wt 222.8 lb

## 2014-06-02 DIAGNOSIS — J449 Chronic obstructive pulmonary disease, unspecified: Secondary | ICD-10-CM

## 2014-06-02 MED ORDER — BUDESONIDE 0.25 MG/2ML IN SUSP: 0.2500 mg | Freq: Two times a day (BID) | RESPIRATORY_TRACT | Status: DC

## 2014-06-02 MED ORDER — ARFORMOTEROL TARTRATE 15 MCG/2ML IN NEBU: 15.0000 ug | INHALATION_SOLUTION | Freq: Two times a day (BID) | RESPIRATORY_TRACT | Status: DC

## 2014-06-02 NOTE — Patient Instructions (Addendum)
Begin Budesonide Neb Twice daily   Begin [employer] Twice daily   May use Albuterol Neb every 4hrs as needed for wheezing/shortness of breath -this is your rescue medicine.  Begin Oxygen 2 l/m with walking and activity.  We are setting you up for Overnight oxygen test.  Follow up in 3-4 weeks for med calendar -bring all medicine bottles and breathing meds, inhalers, and nebs please.

## 2014-06-02 NOTE — Progress Notes (Signed)
Subjective:    Patient ID: Jesus Sanders, male    DOB: 07-Dec-1935   MRN: [id number]  HPI  29 yowm quit smoking 2014 admitted to [facility]   Admit date: 02/21/2014  Discharge date: 02/23/2014    Recommendations for Outpatient Follow-up:  1. New amlodipine added 2. Rec Cardio-piulm rehab, Pulmonology referral Discharge Diagnoses:  Acute-on-chronic respiratory failure   Polycythemia vera(238.4)  HTN (hypertension)  Ischemic cardiomyopathy  GERD (gastroesophageal reflux disease)  COPD exacerbation, likely  COPD with acute exacerbation  Discharge Condition: good  Diet recommendation: reg  Filed Weights    02/21/14 1945  02/22/14 0502   Weight:  100.8 kg (222 lb 3.6 oz)  100.6 kg (221 lb 12.5 oz)   History of present illness:  78 y.o. male came to [facility] ed 02/21/2014 with shortness of breath. No history of polycythemia vera, CAD history 07/2011 anterior apical MI S./P. BMS to LAD resulting in ischemic cardiomyopathy with systolic CHF [systolic 24%] s/p defibrillator 01/2012, severe COPD FEV1/FVC 0.40, H/o DVt in 2001 and [place] to have AECOPD and admitted for the same  Hospital Course:  Acute-on-chronic respiratory failure-likely AECOPD-treat with inhaled nebulizers every 4 when necessary, add steroids to 60 mg prednisone . Patient educated regarding inhalers as he is only on albuterol which is a rescue inhaler but he thought that his nebulizer was to be used Q6 when necessary and inhaler was a controller med  RT to teach use of proper use of inhaler and when to use what  I will prescribe for him Advair forCOPD Gold stage IV at least . He will need a burst of steroids for about 5-7 days and anticipate he will do well.might benefit from Cardio-pulm rehab  Polycythemia vera(238.4) currently with anemia presently-currently on Hydrea and gets prophylactic phlebotomy. To keep his hematocrit less than 40 which is clearly not at that . hydroxyurea was restarted February 2015. He will followup  with his outpatient oncologist when needed  HTN (hypertension)-continue the medications as below. Blood pressure only moderately controlled currently. Increase Lisinopril or add Amlodpine if still elevated in am  Ischemic cardiomyopathy, CAD history , placement of defibrillator 2012-keep on telemetry. Currently doing well -may benefit from alternative agent other than Coreg 25 mg as has COPD . Continue ACE inhibitor lisinopril 10 daily , continue aspirin 81 mg daily  GERD (gastroesophageal reflux disease) continue Protonix in place of Prilosec  History of DVT 2001-completed therapy with Coumadin  History CVA-stable currently  acute kidney injury BN/creatinine 25/1.24. Labs about the same monitor in the am. Lasix held initially but re-started. Will need close monitoring of bmet as OP  Hyperlipidemia-hold statin for now   05/16/2014 1st [facility] office visit/ W[facility]  Chief Complaint  Patient presents with  . Pulmoanary Consult    Pt states that he was referred per Cardiac Rehab.  He c/o DOE for "I don't know, several years".  Occurs when "I do too much".     indolent onset doe x sev years to point where has trouble to mb and back flat has to stop half way back but not every day Also has it sitting still  Also has it also sometimes lying down Can do all but the most rigorous ex at rehab s 02  Better on dulera 100 2bid but not sure of what all meds he's using.   >>d/c ACE and rx Diovan    06/02/2014 Follow up  Patient returns for a followup and medication review. Unfortunately,  pt did not bring any meds with him today.  He brought his pill box , his grand daughter does his meds.  He does not know any of his meds.  He does not have any prescription coverage.  Discussed with grandaughter on phone , can not afford inhalers.  Goes to cardiopulmonary  rehab. Uses Oxygen with exercise.  Not taking Dulera since last ov.   Reports breathing is somewhat worse since last ov with SOB, wheezing,  thorat congestion. Last visit. Patient was changed off of his ACE inhibitor and started on Diovan. Gets short of breath with minimal activity .  O2 sats 94% at rest  Walking O2 sats 87%.  Patient denies any hemoptysis, chest pain, orthopnea, abdominal pain, nausea, vomiting, or leg swelling.     Current Medications, Allergies, Complete Past Medical History, Past Surgical History, Family History, and Social History were reviewed in [employer] record.            Review of Systems  Constitutional: Negative for fever, chills, activity change, appetite change and unexpected weight change.  HENT: Negative for congestion, dental problem, postnasal drip, rhinorrhea, sneezing, sore throat, trouble swallowing and voice change.   Eyes: Negative for visual disturbance.  Respiratory: Positive for shortness of breath. Negative for cough and choking.   Cardiovascular: Negative for chest pain and leg swelling.  Gastrointestinal: Negative for nausea, vomiting and abdominal pain.  Genitourinary: Negative for difficulty urinating.  Musculoskeletal: Negative for arthralgias.  Skin: Negative for rash.  Psychiatric/Behavioral: Negative for behavioral problems and confusion.       Objective:   Physical Exam    NAD , elderly  HEENT mild turbinate edema.  Oropharynx no thrush or excess pnd or cobblestoning.  No JVD or cervical adenopathy. Mild accessory muscle hypertrophy. Trachea midline, nl thryroid. Chest was hyperinflated by percussion with diminished breath sounds and moderate increased exp time without wheeze. Regular rate and rhythm without murmur gallop or rub or increase P2 or edema.  Abd: no hsm, nl excursion. Ext warm without cyanosis or clubbing.    CT chest 02/21/14 No CT evidence for acute intrathoracic abnormality.  Specifically, no definite evidence for acute pulmonary embolism.  Low-density elliptical shaped lesion adjacent to the right main  pulmonary artery  appears to produce extrinsic compression upon the  artery, favoring loculated fluid within a pericardial recess or  possibly reactive lymph node. Partially resorbed remote peripheral  pulmonary arterial embolism is possible but felt less likely.   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

## 2014-06-02 NOTE — Assessment & Plan Note (Signed)
Severe COPD w/ ambulatory desats  Agrees to begin O2 Check ONO  No rx coverage, will need to use Nebs  Consider adding atrovent neb on return   Plan  Begin Budesonide Neb Twice daily   Begin Brovana Neb Twice daily   May use Albuterol Neb every 4hrs as needed for wheezing/shortness of breath -this is your rescue medicine.  Begin Oxygen 2 l/m with walking and activity.  We are setting you up for Overnight oxygen test.  Follow up in 3-4 weeks for med calendar -bring all medicine bottles and breathing meds, inhalers, and nebs please.

## 2014-06-03 ENCOUNTER — Encounter (HOSPITAL_COMMUNITY)
Admission: RE | Admit: 2014-06-03 | Discharge: 2014-06-03 | Disposition: A | Discharge status: Home / Self Care | Payer: Medicare Other | Source: Ambulatory Visit | Attending: Family Medicine | Admitting: Family Medicine

## 2014-06-03 DIAGNOSIS — Z5189 Encounter for other specified aftercare: Secondary | ICD-10-CM | POA: Diagnosis not present

## 2014-06-03 NOTE — Progress Notes (Signed)
Today, Jesus Sanders exercised at [employer]. Service time was from 1030 to 1200.  The patient exercised for more than 31 minutes performing aerobic, strengthening, and stretching exercises. Oxygen saturation, heart rate, blood pressure, rate of perceived exertion, and shortness of breath were all monitored before, during, and after exercise. Jesus Sanders presented with no problems at today's exercise session.   There was no workload change during today's exercise session.  Pre-exercise vitals:   Weight kg: 100.7   Liters of O2: RA   SpO2: 92   HR: 66   BP: 120/78   CBG: NA  Exercise vitals:   Highest heartrate:  80   Lowest oxygen saturation: 93   Highest blood pressure: 108/64   Liters of 02: 3  Post-exercise vitals:   SpO2: 100   HR: 73   BP: 104/62   Liters of O2: 3   CBG: NA Dr. Brand Males, Medical Director Dr. Carles Collet is immediately available during today's Pulmonary Rehab session for Jesus Sanders on 06/03/2014 at 1030 class time.

## 2014-06-04 ENCOUNTER — Telehealth: Payer: Self-pay | Admitting: Internal Medicine

## 2014-06-04 MED ORDER — BUDESONIDE-FORMOTEROL FUMARATE 160-4.5 MCG/ACT IN AERO: 2.0000 | INHALATION_SPRAY | Freq: Two times a day (BID) | RESPIRATORY_TRACT | Status: DC

## 2014-06-04 NOTE — Telephone Encounter (Signed)
Spouse aware of recs. Samples left for pick up. Nothing further needed

## 2014-06-04 NOTE — Telephone Encounter (Signed)
Spoke with patient daughter Phoebe Sharps States that the nebulizer medications rxd at last OV 06/02/14 by TP are not approved by insurance-- Brovana and Budesonide BID Pt daughter states that she spoke with pt insurance and they advised the patient to speak with physician about ordering samples specifically for him OR giving alternatives to cover him until he is eligible for Medicare.   Pt will not be covered under Medicare until Fall 2015 (October)  Please advise Dr [name] of the patients options until he receives coverage in Fall. Thanks.

## 2014-06-04 NOTE — Telephone Encounter (Signed)
Start symbicort 160 2bid and give 2 samples then ov here with all meds in hand before runs out of this

## 2014-06-05 ENCOUNTER — Other Ambulatory Visit: Payer: Self-pay | Admitting: Internal Medicine

## 2014-06-05 ENCOUNTER — Encounter (HOSPITAL_COMMUNITY): Payer: Medicare Other

## 2014-06-05 MED ORDER — ALBUTEROL SULFATE HFA 108 (90 BASE) MCG/ACT IN AERS: 2.0000 | INHALATION_SPRAY | Freq: Four times a day (QID) | RESPIRATORY_TRACT | Status: DC | PRN

## 2014-06-05 NOTE — Telephone Encounter (Signed)
Received faxed refill request from [place] for Proventil HFA Last ov 7.27.15 w/ TP Rx sent

## 2014-06-10 ENCOUNTER — Telehealth: Payer: Self-pay | Admitting: Internal Medicine

## 2014-06-10 ENCOUNTER — Encounter (HOSPITAL_COMMUNITY): Payer: Medicare Other

## 2014-06-10 DIAGNOSIS — J449 Chronic obstructive pulmonary disease, unspecified: Secondary | ICD-10-CM

## 2014-06-10 MED ORDER — BUDESONIDE 0.25 MG/2ML IN SUSP: 0.2500 mg | Freq: Two times a day (BID) | RESPIRATORY_TRACT | Status: DC

## 2014-06-10 MED ORDER — ARFORMOTEROL TARTRATE 15 MCG/2ML IN NEBU: 15.0000 ug | INHALATION_SOLUTION | Freq: Two times a day (BID) | RESPIRATORY_TRACT | Status: DC

## 2014-06-10 NOTE — Psychosocial Assessment (Signed)
Staff MD, Rehab Director Note/Attestation  Reviewed pshyosocial assessment. Agree with goal  Laid out by the staff of rehab  Dr. Brand Males, M.D., [facility].C.P Pulmonary and Critical Care Medicine Staff Physician and Director of [place] [facility] Pager: [phone], If no answer or between  15:00h - 7:00h: call [phone]  06/10/2014 4:05 PM

## 2014-06-10 NOTE — Telephone Encounter (Signed)
RX's have been resent. Morey Hummingbird is aware. Nothing further needed

## 2014-06-12 ENCOUNTER — Encounter (HOSPITAL_COMMUNITY): Payer: Medicare Other

## 2014-06-17 ENCOUNTER — Encounter (HOSPITAL_COMMUNITY)
Admission: RE | Admit: 2014-06-17 | Discharge: 2014-06-17 | Disposition: A | Discharge status: Home / Self Care | Payer: Medicare Other | Source: Ambulatory Visit | Attending: Family Medicine | Admitting: Family Medicine

## 2014-06-17 ENCOUNTER — Encounter: Payer: Self-pay | Admitting: *Deleted

## 2014-06-17 DIAGNOSIS — J441 Chronic obstructive pulmonary disease with (acute) exacerbation: Secondary | ICD-10-CM | POA: Diagnosis not present

## 2014-06-17 DIAGNOSIS — I2589 Other forms of chronic ischemic heart disease: Secondary | ICD-10-CM | POA: Insufficient documentation

## 2014-06-17 DIAGNOSIS — I10 Essential (primary) hypertension: Secondary | ICD-10-CM | POA: Insufficient documentation

## 2014-06-17 DIAGNOSIS — K219 Gastro-esophageal reflux disease without esophagitis: Secondary | ICD-10-CM | POA: Insufficient documentation

## 2014-06-17 DIAGNOSIS — D45 Polycythemia vera: Secondary | ICD-10-CM | POA: Diagnosis not present

## 2014-06-17 DIAGNOSIS — J9620 Acute and chronic respiratory failure, unspecified whether with hypoxia or hypercapnia: Secondary | ICD-10-CM | POA: Diagnosis not present

## 2014-06-17 DIAGNOSIS — Z5189 Encounter for other specified aftercare: Secondary | ICD-10-CM | POA: Diagnosis not present

## 2014-06-17 NOTE — Progress Notes (Signed)
Today, Jesus Sanders exercised at [employer]. [facility]. Service time was from 10:30am to 12:15pm.  The patient exercised for more than 31 minutes performing aerobic, strengthening, and stretching exercises. Oxygen saturation, heart rate, blood pressure, rate of perceived exertion, and shortness of breath were all monitored before, during, and after exercise. Jesus Sanders presented with no problems at today's exercise session.   There was no workload change during today's exercise session.  Pre-exercise vitals:   Weight kg: 104/60   Liters of O2: 2   SpO2: 95   HR: 76   BP: 104/60   CBG: na  Exercise vitals:   Highest heartrate:  75   Lowest oxygen saturation: 89% increased to 90% with pursed lip breathing   Highest blood pressure: 134/64   Liters of 02: 2liters increased 3liters  Post-exercise vitals:   SpO2: 92   HR: 72   BP: 126/74   Liters of O2: ra   CBG: na  Dr. Brand Males, Medical Director Dr. Frederic Jericho is immediately available during today's Pulmonary Rehab session for Jesus Sanders on 06/17/14 at 10:30am class time.

## 2014-06-19 ENCOUNTER — Encounter (HOSPITAL_COMMUNITY)
Admission: RE | Admit: 2014-06-19 | Discharge: 2014-06-19 | Disposition: A | Discharge status: Home / Self Care | Payer: Medicare Other | Source: Ambulatory Visit | Attending: Family Medicine | Admitting: Family Medicine

## 2014-06-19 NOTE — Psychosocial Assessment (Signed)
Jesus Sanders 78 y.o. male  45 day Psychosocial Note  Patient psychosocial assessment reveals barriers to participation in Pulmonary Rehab. Psychosocial areas that are currently affecting patient include his loss of interest in physical activities. His attendance has been poor over the last 30 days. Patient states he just has not felt well. He attributed his lack of energy and poor attendance to the weather. States he just can't tolerate the heat or outside activities. Patient seemed to be more positive at last rehab visit and exhibited positive coping skills. Offered emotional support and reassurance. Patient does not feel he is making progress toward Pulmonary Rehab goals, however he feels he is maintaining. Patient reports his health and activity level has not improved in the past 30 days as evidenced by patient's report of not significantly changed ability to walk to the mailbox with better ease and less SOB. Patient states his daughter has not noticed changes in his activity or mood. Patient reports he does not feel positive about current and projected progression in Pulmonary Rehab. When questioned, patient scored a 2 on PHQ2 at most recent visit. Will continue to monitor patient for depression symptoms. After reviewing the patient's treatment plan, the patient is not making progress toward Pulmonary Rehab goals. Patient's rate of progress toward rehab goals is poor. Plan of action to help patient continue to work towards rehab goals include increasing workloads as tolerated on equipment in order to increase stamina and stregnth. Will continue to monitor and evaluate progress toward psychosocial goal(s).  Goal(s) in progress: Improved management of depression  Improved coping skills  Help patient work toward returning to meaningful activities that improve patient's QOL and are attainable with patient's lung disease

## 2014-06-19 NOTE — Psychosocial Assessment (Signed)
Staff MD, Rehab Director Note/Attestation  Reviewed pshyosocial assessment. Agree with goal  Laid out by the staff of rehab  Dr. Brand Males, M.D., [facility].C.P Pulmonary and Critical Care Medicine Staff Physician and Director of [place] [facility] Pager: [phone], If no answer or between  15:00h - 7:00h: call [phone]  06/19/2014 11:33 AM

## 2014-06-23 ENCOUNTER — Ambulatory Visit (INDEPENDENT_AMBULATORY_CARE_PROVIDER_SITE_OTHER): Payer: Medicare Other | Admitting: Adult Health

## 2014-06-23 ENCOUNTER — Encounter: Payer: Self-pay | Admitting: Adult Health

## 2014-06-23 VITALS — BP 92/54 | HR 71 | Temp 97.6°F | Ht 72.0 in | Wt 226.8 lb

## 2014-06-23 DIAGNOSIS — J449 Chronic obstructive pulmonary disease, unspecified: Secondary | ICD-10-CM

## 2014-06-23 NOTE — Progress Notes (Signed)
Subjective:    Patient ID: Jesus Sanders, male    DOB: Mar 28, 1936   MRN: [id number]  HPI  79 yowm quit smoking 2014 admitted to [facility]   Admit date: 02/21/2014  Discharge date: 02/23/2014    Recommendations for Outpatient Follow-up:  1. New amlodipine added 2. Rec Cardio-piulm rehab, Pulmonology referral Discharge Diagnoses:  Acute-on-chronic respiratory failure   Polycythemia vera(238.4)  HTN (hypertension)  Ischemic cardiomyopathy  GERD (gastroesophageal reflux disease)  COPD exacerbation, likely  COPD with acute exacerbation  Discharge Condition: good  Diet recommendation: reg  Filed Weights    02/21/14 1945  02/22/14 0502   Weight:  100.8 kg (222 lb 3.6 oz)  100.6 kg (221 lb 12.5 oz)   History of present illness:  78 y.o. male came to [facility] ed 02/21/2014 with shortness of breath. No history of polycythemia vera, CAD history 07/2011 anterior apical MI S./P. BMS to LAD resulting in ischemic cardiomyopathy with systolic CHF [systolic 26%] s/p defibrillator 01/2012, severe COPD FEV1/FVC 0.40, H/o DVt in 2001 and [place] to have AECOPD and admitted for the same  Hospital Course:  Acute-on-chronic respiratory failure-likely AECOPD-treat with inhaled nebulizers every 4 when necessary, add steroids to 60 mg prednisone . Patient educated regarding inhalers as he is only on albuterol which is a rescue inhaler but he thought that his nebulizer was to be used Q6 when necessary and inhaler was a controller med  RT to teach use of proper use of inhaler and when to use what  I will prescribe for him Advair forCOPD Gold stage IV at least . He will need a burst of steroids for about 5-7 days and anticipate he will do well.might benefit from Cardio-pulm rehab  Polycythemia vera(238.4) currently with anemia presently-currently on Hydrea and gets prophylactic phlebotomy. To keep his hematocrit less than 40 which is clearly not at that . hydroxyurea was restarted February 2015. He will followup  with his outpatient oncologist when needed  HTN (hypertension)-continue the medications as below. Blood pressure only moderately controlled currently. Increase Lisinopril or add Amlodpine if still elevated in am  Ischemic cardiomyopathy, CAD history , placement of defibrillator 2012-keep on telemetry. Currently doing well -may benefit from alternative agent other than Coreg 25 mg as has COPD . Continue ACE inhibitor lisinopril 10 daily , continue aspirin 81 mg daily  GERD (gastroesophageal reflux disease) continue Protonix in place of Prilosec  History of DVT 2001-completed therapy with Coumadin  History CVA-stable currently  acute kidney injury BN/creatinine 25/1.24. Labs about the same monitor in the am. Lasix held initially but re-started. Will need close monitoring of bmet as OP  Hyperlipidemia-hold statin for now   05/16/2014 1st [facility] office visit/ [facility]  Chief Complaint  Patient presents with  . Pulmoanary Consult    Pt states that he was referred per Cardiac Rehab.  He c/o DOE for "I don't know, several years".  Occurs when "I do too much".     indolent onset doe x sev years to point where has trouble to mb and back flat has to stop half way back but not every day Also has it sitting still  Also has it also sometimes lying down Can do all but the most rigorous ex at rehab s 02  Better on dulera 100 2bid but not sure of what all meds he's using.   >>d/c ACE and rx Diovan    06/02/2014 Follow up  Patient returns for a followup and medication review. Unfortunately,  pt did not bring any meds with him today.  He brought his pill box , his grand daughter does his meds.  He does not know any of his meds.  He does not have any prescription coverage.  Discussed with grandaughter on phone , can not afford inhalers.  Goes to cardiopulmonary  rehab. Uses Oxygen with exercise.  Not taking Dulera since last ov.   Reports breathing is somewhat worse since last ov with SOB, wheezing,  thorat congestion. Last visit. Patient was changed off of his ACE inhibitor and started on Diovan. Gets short of breath with minimal activity .  O2 sats 94% at rest  Walking O2 sats 87%.  Patient denies any hemoptysis, chest pain, orthopnea, abdominal pain, nausea, vomiting, or leg swelling.  >>started on O2. With walking. rx brovana/pulmicort neb   06/23/2014 Follow up and Med Review  Pt returns for follow up and med review Last ov was started on pulmicort and brovana (he does not have rx coverage). And started on O2 with walking for desats.  He appears to be taking meds correctly. Says he breathing is stable with no flare in cough or wheezing.  No fever, chest pain, edema or hemoptysis.   Current Medications, Allergies, Complete Past Medical History, Past Surgical History, Family History, and Social History were reviewed in [employer] record.         Review of Systems  Constitutional: Negative for fever, chills, activity change, appetite change and unexpected weight change.  HENT: Negative for congestion, dental problem, postnasal drip, rhinorrhea, sneezing, sore throat, trouble swallowing and voice change.   Eyes: Negative for visual disturbance.  Respiratory: Positive for shortness of breath. Negative for cough and choking.   Cardiovascular: Negative for chest pain and leg swelling.  Gastrointestinal: Negative for nausea, vomiting and abdominal pain.  Genitourinary: Negative for difficulty urinating.  Musculoskeletal: Negative for arthralgias.  Skin: Negative for rash.  Psychiatric/Behavioral: Negative for behavioral problems and confusion.       Objective:   Physical Exam    NAD , elderly  HEENT mild turbinate edema.  Oropharynx no thrush or excess pnd or cobblestoning.  No JVD or cervical adenopathy. Mild accessory muscle hypertrophy. Trachea midline, nl thryroid. Chest was hyperinflated by percussion with diminished breath sounds and moderate  increased exp time without wheeze. Regular rate and rhythm without murmur gallop or rub or increase P2 or edema.  Abd: no hsm, nl excursion. Ext warm without cyanosis or clubbing.    CT chest 02/21/14 No CT evidence for acute intrathoracic abnormality.  Specifically, no definite evidence for acute pulmonary embolism.  Low-density elliptical shaped lesion adjacent to the right main  pulmonary artery appears to produce extrinsic compression upon the  artery, favoring loculated fluid within a pericardial recess or  possibly reactive lymph node. Partially resorbed remote peripheral  pulmonary arterial embolism is possible but felt less likely.   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

## 2014-06-23 NOTE — Assessment & Plan Note (Signed)
Compensated on present regimen  Patient's medications were reviewed today and patient education was given. Computerized medication calendar was adjusted/completed   Plan  Continue on current regimen .  Change to Mucinex DM Twice daily  As needed  Cough/congestion  May use Albuterol Neb every 4hrs as needed for wheezing/shortness of breath -this is your rescue medicine.  Continue with Oxygen with walking.  Follow up with Dr. [name]  In 2 months and As needed

## 2014-06-23 NOTE — Patient Instructions (Addendum)
Follow med calendar closely and bring to each visit.  Continue on current regimen .  Change to Mucinex DM Twice daily  As needed  Cough/congestion  May use Albuterol Neb every 4hrs as needed for wheezing/shortness of breath -this is your rescue medicine.  Continue with Oxygen with walking.  Follow up with Dr. [name]  In 2 months and As needed

## 2014-06-24 ENCOUNTER — Encounter (HOSPITAL_COMMUNITY): Payer: Medicare Other

## 2014-06-26 ENCOUNTER — Encounter (HOSPITAL_COMMUNITY): Payer: Medicare Other

## 2014-06-27 ENCOUNTER — Other Ambulatory Visit: Payer: Medicare Other

## 2014-07-01 ENCOUNTER — Encounter (HOSPITAL_COMMUNITY): Payer: Medicare Other

## 2014-07-03 ENCOUNTER — Encounter (HOSPITAL_COMMUNITY): Payer: Medicare Other

## 2014-07-07 NOTE — Progress Notes (Signed)
                                                   PULMONARY REHAB DISCHARGE NOTE  Patient has dropped out of pulmonary rehab due to poor attendance.  He only attended 7 exercise sessions.  When patient was called he stated I'm just not coming back, he did not give a specific reason.  He did not meet any goals due to the short time in the program.  Unable to perform PHQ.

## 2014-07-07 NOTE — Psychosocial Assessment (Signed)
Jesus Sanders 78 y.o. male  30 day Psychosocial and Discharge Note  Patient psychosocial assessment reveals one barrier to participation in Pulmonary Rehab. Psychosocial area that is currently affecting patient's rehab experience includes poor attendance.  Patient only attended 7 exercise sessions in the last 30 days.  When I called to check on patient at home he stated he did not want to return, but would not give a specific reason.   Patient's coping skills were not addressed.  No patient goals were met due to such a short period in the program.    Goal(s) not met: Patient wanted to be more active outdoors, and be able to walk to the mailbox with more ease.

## 2014-07-07 NOTE — Addendum Note (Signed)
Encounter addended by: Liliane Channel, RN on: 07/07/2014 11:47 AM<BR>     Documentation filed: Clinical Notes, Notes Section

## 2014-07-07 NOTE — Psychosocial Assessment (Signed)
Staff MD, Rehab Director Note/Attestation  Reviewed pshyosocial assessment. Agree with goal  Laid out by the staff of rehab  Dr. Brand Males, M.D., [facility].C.P Pulmonary and Critical Care Medicine Staff Physician and Director of [place] [facility] Pager: [phone], If no answer or between  15:00h - 7:00h: call [phone]  07/07/2014 2:53 PM

## 2014-07-08 ENCOUNTER — Encounter (HOSPITAL_COMMUNITY): Payer: Medicare Other

## 2014-07-10 ENCOUNTER — Encounter (HOSPITAL_COMMUNITY): Payer: Medicare Other

## 2014-07-15 ENCOUNTER — Encounter (HOSPITAL_COMMUNITY): Payer: Medicare Other

## 2014-07-17 ENCOUNTER — Encounter (HOSPITAL_COMMUNITY): Payer: Medicare Other

## 2014-07-18 ENCOUNTER — Encounter (HOSPITAL_COMMUNITY): Payer: Self-pay

## 2014-07-18 NOTE — Progress Notes (Signed)
[facility] Pulmonary Rehabilitation Final/Discharge Outcome Results    Patient was discharged early from the program due to poor attendance and no longer wanting to continue in the program.  There was no exit data collected due to an abrupt discharge.  Patient was encouraged to contact us in the future if he wants to re-enter the program.

## 2014-07-22 ENCOUNTER — Encounter (HOSPITAL_COMMUNITY): Payer: Medicare Other

## 2014-07-24 ENCOUNTER — Other Ambulatory Visit: Payer: Self-pay | Admitting: Emergency Medicine

## 2014-07-24 ENCOUNTER — Encounter (HOSPITAL_COMMUNITY): Payer: Medicare Other

## 2014-07-24 DIAGNOSIS — D45 Polycythemia vera: Secondary | ICD-10-CM

## 2014-07-25 ENCOUNTER — Other Ambulatory Visit (HOSPITAL_BASED_OUTPATIENT_CLINIC_OR_DEPARTMENT_OTHER): Payer: Medicare Other

## 2014-07-25 DIAGNOSIS — D45 Polycythemia vera: Secondary | ICD-10-CM

## 2014-07-25 LAB — CBC WITH DIFFERENTIAL/PLATELET
BASO%: 0.7 % (ref 0.0–2.0)
Basophils Absolute: 0.1 10*3/uL (ref 0.0–0.1)
EOS%: 2.6 % (ref 0.0–7.0)
Eosinophils Absolute: 0.2 10*3/uL (ref 0.0–0.5)
HEMATOCRIT: 44.5 % (ref 38.4–49.9)
HGB: 13.4 g/dL (ref 13.0–17.1)
LYMPH#: 1.2 10*3/uL (ref 0.9–3.3)
LYMPH%: 16.3 % (ref 14.0–49.0)
MCH: 25.4 pg — ABNORMAL LOW (ref 27.2–33.4)
MCHC: 30.1 g/dL — AB (ref 32.0–36.0)
MCV: 84.6 fL (ref 79.3–98.0)
MONO#: 0.9 10*3/uL (ref 0.1–0.9)
MONO%: 12.3 % (ref 0.0–14.0)
NEUT#: 5 10*3/uL (ref 1.5–6.5)
NEUT%: 68.1 % (ref 39.0–75.0)
Platelets: 128 10*3/uL — ABNORMAL LOW (ref 140–400)
RBC: 5.26 10*6/uL (ref 4.20–5.82)
RDW: 18.2 % — ABNORMAL HIGH (ref 11.0–14.6)
WBC: 7.3 10*3/uL (ref 4.0–10.3)

## 2014-07-29 ENCOUNTER — Encounter (HOSPITAL_COMMUNITY): Payer: Medicare Other

## 2014-07-31 ENCOUNTER — Encounter (HOSPITAL_COMMUNITY): Payer: Medicare Other

## 2014-08-05 ENCOUNTER — Encounter (HOSPITAL_COMMUNITY): Payer: Medicare Other

## 2014-08-07 ENCOUNTER — Encounter (HOSPITAL_COMMUNITY): Payer: Medicare Other

## 2014-08-12 ENCOUNTER — Encounter: Payer: Self-pay | Admitting: *Deleted

## 2014-08-12 ENCOUNTER — Encounter (HOSPITAL_COMMUNITY): Payer: Medicare Other

## 2014-08-14 ENCOUNTER — Encounter (HOSPITAL_COMMUNITY): Payer: Medicare Other

## 2014-08-18 ENCOUNTER — Emergency Department (HOSPITAL_COMMUNITY): Payer: Medicare Other

## 2014-08-18 ENCOUNTER — Emergency Department (HOSPITAL_COMMUNITY)
Admission: EM | Admit: 2014-08-18 | Discharge: 2014-08-19 | Disposition: A | Discharge status: Home / Self Care | Payer: Medicare Other | Attending: Emergency Medicine | Admitting: Emergency Medicine

## 2014-08-18 ENCOUNTER — Encounter (HOSPITAL_COMMUNITY): Payer: Self-pay | Admitting: Emergency Medicine

## 2014-08-18 DIAGNOSIS — I10 Essential (primary) hypertension: Secondary | ICD-10-CM | POA: Insufficient documentation

## 2014-08-18 DIAGNOSIS — J159 Unspecified bacterial pneumonia: Secondary | ICD-10-CM | POA: Insufficient documentation

## 2014-08-18 DIAGNOSIS — I5022 Chronic systolic (congestive) heart failure: Secondary | ICD-10-CM | POA: Diagnosis not present

## 2014-08-18 DIAGNOSIS — Z9889 Other specified postprocedural states: Secondary | ICD-10-CM | POA: Diagnosis not present

## 2014-08-18 DIAGNOSIS — J189 Pneumonia, unspecified organism: Secondary | ICD-10-CM

## 2014-08-18 DIAGNOSIS — Z87891 Personal history of nicotine dependence: Secondary | ICD-10-CM | POA: Insufficient documentation

## 2014-08-18 DIAGNOSIS — I252 Old myocardial infarction: Secondary | ICD-10-CM | POA: Diagnosis not present

## 2014-08-18 DIAGNOSIS — Z9981 Dependence on supplemental oxygen: Secondary | ICD-10-CM | POA: Insufficient documentation

## 2014-08-18 DIAGNOSIS — Z7982 Long term (current) use of aspirin: Secondary | ICD-10-CM | POA: Diagnosis not present

## 2014-08-18 DIAGNOSIS — K219 Gastro-esophageal reflux disease without esophagitis: Secondary | ICD-10-CM | POA: Diagnosis not present

## 2014-08-18 DIAGNOSIS — Z95810 Presence of automatic (implantable) cardiac defibrillator: Secondary | ICD-10-CM | POA: Insufficient documentation

## 2014-08-18 DIAGNOSIS — J441 Chronic obstructive pulmonary disease with (acute) exacerbation: Secondary | ICD-10-CM | POA: Diagnosis not present

## 2014-08-18 DIAGNOSIS — Z86718 Personal history of other venous thrombosis and embolism: Secondary | ICD-10-CM | POA: Insufficient documentation

## 2014-08-18 DIAGNOSIS — I2510 Atherosclerotic heart disease of native coronary artery without angina pectoris: Secondary | ICD-10-CM | POA: Insufficient documentation

## 2014-08-18 DIAGNOSIS — Z79899 Other long term (current) drug therapy: Secondary | ICD-10-CM | POA: Diagnosis not present

## 2014-08-18 DIAGNOSIS — Z7951 Long term (current) use of inhaled steroids: Secondary | ICD-10-CM | POA: Diagnosis not present

## 2014-08-18 DIAGNOSIS — J449 Chronic obstructive pulmonary disease, unspecified: Secondary | ICD-10-CM | POA: Diagnosis present

## 2014-08-18 LAB — CBC
HEMATOCRIT: 44.2 % (ref 39.0–52.0)
Hemoglobin: 13.2 g/dL (ref 13.0–17.0)
MCH: 26.5 pg (ref 26.0–34.0)
MCHC: 29.9 g/dL — ABNORMAL LOW (ref 30.0–36.0)
MCV: 88.8 fL (ref 78.0–100.0)
PLATELETS: 129 10*3/uL — AB (ref 150–400)
RBC: 4.98 MIL/uL (ref 4.22–5.81)
RDW: 17.7 % — AB (ref 11.5–15.5)
WBC: 7.6 10*3/uL (ref 4.0–10.5)

## 2014-08-18 LAB — BASIC METABOLIC PANEL
Anion gap: 9 (ref 5–15)
BUN: 37 mg/dL — AB (ref 6–23)
CALCIUM: 9.5 mg/dL (ref 8.4–10.5)
CO2: 26 meq/L (ref 19–32)
Chloride: 102 mEq/L (ref 96–112)
Creatinine, Ser: 1.67 mg/dL — ABNORMAL HIGH (ref 0.50–1.35)
GFR calc Af Amer: 44 mL/min — ABNORMAL LOW (ref >90)
GFR, EST NON AFRICAN AMERICAN: 38 mL/min — AB (ref >90)
GLUCOSE: 84 mg/dL (ref 70–99)
Potassium: 5.1 mEq/L (ref 3.7–5.3)
Sodium: 137 mEq/L (ref 137–147)

## 2014-08-18 LAB — PRO B NATRIURETIC PEPTIDE: PRO B NATRI PEPTIDE: 308.9 pg/mL (ref 0–450)

## 2014-08-18 LAB — I-STAT TROPONIN, ED: Troponin i, poc: 0 ng/mL (ref 0.00–0.08)

## 2014-08-18 MED ORDER — IPRATROPIUM-ALBUTEROL 0.5-2.5 (3) MG/3ML IN SOLN
3.0000 mL | Freq: Once | RESPIRATORY_TRACT | Status: AC
  Administered 2014-08-18: 3 mL via RESPIRATORY_TRACT
  Filled 2014-08-18: qty 3

## 2014-08-18 MED ORDER — PREDNISONE 20 MG PO TABS: ORAL_TABLET | ORAL | Status: DC

## 2014-08-18 MED ORDER — ALBUTEROL SULFATE (2.5 MG/3ML) 0.083% IN NEBU
2.5000 mg | INHALATION_SOLUTION | Freq: Once | RESPIRATORY_TRACT | Status: AC
  Administered 2014-08-18: 2.5 mg via RESPIRATORY_TRACT
  Filled 2014-08-18: qty 3

## 2014-08-18 NOTE — ED Provider Notes (Signed)
CSN: [id number]     Arrival date & time 08/18/14  2120 History   First MD Initiated Contact with Patient 08/18/14 2139     Chief Complaint  Patient presents with  . COPD  . Shortness of Breath     (Consider location/radiation/quality/duration/timing/severity/associated sxs/prior Treatment) HPI 78 year old male with history of COPD, coronary artery disease, heart failure, ejection fraction 25-30%, stopped smoking in 2014, uses chronic home oxygen 2 L nasal, no recent oral steroids, was well until this evening now presents with a few hours of coughing moderate shortness of breath and wheezing which is almost resolved after receiving 125 mg Solu-Medrol 10 mg albuterol and Atrovent from EMS. He has minimal cough no shortness of breath now he had no fever no chest pain no confusion no other concerns. He does not want another breathing treatment now. He has no edema. He feels dramatically better after the nebulizer from EMS. Past Medical History  Diagnosis Date  . Polycythemia vera(238.4)     a. Used to receive chronic phlebotomies until 2007, at regional [place].  will restart his phlebotomies from about Mar 15 2011  . Stomach ulcer   . Tobacco abuse     a. cigars  . Duodenal perforation June 2012  . Peritonitis June 2012  . Pneumonia April 2012  . Systolic CHF, chronic     a. 12/2011 Echo: EF 25-30%, mid-dist antsept/inf, apical AK, Gr 1 DD, Triv AI, Mild MR.  . Ischemic cardiomyopathy     a. 01/2012 S/P MDT Protecta single lead ICD, ser # [id number]  . History of DVT (deep vein thrombosis)   . Chronic bronchitis   . GERD (gastroesophageal reflux disease)   . Myocardial infarction   . Shortness of breath   . COPD (chronic obstructive pulmonary disease)   . Hypertension   . CAD (coronary artery disease)     a. 07/2011 Anterior apical STEMI/Cath/PCI: LM nl, LAD 100p/m (2.5 x 21mm Mini-Vision BMS), LCX 40p, RCA dominant, nl, EF 30%.   Past Surgical History  Procedure Laterality Date   . Cholecystectomy  03/2011    Dr. Marlou Starks  . Colon surgery    . Cardiac catheterization  Sept 2012    Normal left main, occluded LAD, 40% LCX and normal RCA. EF is 30%  . US echocardiography  Sept 2012    EF 25 to 30% with akinesis of the mid to distal anterior and apical myocardium, trivial AI and no apical thrombus  . Cardiac defibrillator placement      single chamber  . Shoulder arthroscopy      left, rotatotor cuff tendinopathy   Family History  Problem Relation Age of Onset  . Lung disease Father     [name]- worked at a [employer]    History  Substance Use Topics  . Smoking status: Former Smoker -- 0.50 packs/day for 60 years    Types: Cigars    Quit date: 05/18/2013  . Smokeless tobacco: Current User    Types: Chew  . Alcohol Use: Yes     Comment: rarely     Review of Systems 10 Systems reviewed and are negative for acute change except as noted in the HPI.   Allergies  Review of patient's allergies indicates no known allergies.  Home Medications   Prior to Admission medications   Medication Sig Start Date End Date Taking? Authorizing Provider  albuterol (PROVENTIL HFA;VENTOLIN HFA) 108 (90 BASE) MCG/ACT inhaler Inhale 2 puffs into the lungs every 6 (six)  hours as needed for wheezing or shortness of breath. 06/05/14  Yes Tanda Rockers, MD  amLODipine (NORVASC) 10 MG tablet Take 1 tablet (10 mg total) by mouth daily. 02/23/14  Yes Nita Sells, MD  arformoterol (BROVANA) 15 MCG/2ML NEBU Take 2 mLs (15 mcg total) by nebulization 2 (two) times daily. Dx 491.9 06/10/14  Yes Tanda Rockers, MD  aspirin EC 81 MG tablet Take 81 mg by mouth daily.   Yes Historical Provider, MD  budesonide (PULMICORT) 0.25 MG/2ML nebulizer solution Take 2 mLs (0.25 mg total) by nebulization 2 (two) times daily. Dx 491.9 06/10/14  Yes Tanda Rockers, MD  azithromycin (ZITHROMAX Z-PAK) 250 MG tablet 2 po day one, then 1 daily x 4 days 08/19/14   Everlene Balls, MD  carvedilol (COREG) 25 MG  tablet Take 25 mg by mouth 2 (two) times daily with a meal.    Historical Provider, MD  dextromethorphan-guaiFENesin (MUCINEX DM) 30-600 MG per 12 hr tablet Take 1 tablet by mouth 2 (two) times daily as needed for cough.    Historical Provider, MD  furosemide (LASIX) 40 MG tablet Take 40 mg by mouth daily.     Historical Provider, MD  hydroxyurea (HYDREA) 500 MG capsule Take 1 capsule (500 mg total) by mouth daily. May take with food to minimize GI side effects. 12/10/13   Chauncey Cruel, MD  lovastatin (MEVACOR) 20 MG tablet Take 20 mg by mouth at bedtime.    Historical Provider, MD  omeprazole (PRILOSEC) 20 MG capsule Take 20 mg by mouth daily.    Historical Provider, MD  predniSONE (DELTASONE) 20 MG tablet 3 tabs po day one, then 2 po daily x 4 days 08/18/14   Babette Relic, MD  valsartan (DIOVAN) 80 MG tablet TAKE ONE TABLET BY MOUTH ONCE DAILY 08/27/14   Tanda Rockers, MD   BP 119/58 mmHg  Pulse 66  Temp(Src) 97.8 F (36.6 C) (Axillary)  Resp 24  Ht 6' (1.829 m)  Wt 220 lb (99.791 kg)  BMI 29.83 kg/m2  SpO2 91% Physical Exam  Nursing note and vitals reviewed. Constitutional:  Awake, alert, nontoxic appearance.  HENT:  Head: Atraumatic.  Eyes: Right eye exhibits no discharge. Left eye exhibits no discharge.  Neck: Neck supple.  Cardiovascular: Normal rate and regular rhythm.   No murmur heard. Pulmonary/Chest: Effort normal. No respiratory distress. He has wheezes. He has rales. He exhibits no tenderness.  Pulse oximetry within normal limits on Baseline nasal oxygen 2 L with pulse oximetry 95%; patient speaks full sentences at rest with minimal scattered expiratory wheezes and minimal bibasilar crackles with no retractions no accessory muscle usage  Abdominal: Soft. Bowel sounds are normal. He exhibits no distension. There is no tenderness. There is no rebound and no guarding.  Musculoskeletal: He exhibits no edema and no tenderness.  Baseline ROM, no obvious new focal weakness.   Neurological: He is alert.  Mental status and motor strength appears baseline for patient and situation.  Skin: No rash noted.  Psychiatric: He has a normal mood and affect.    ED Course  Procedures (including critical care time) Patient understand and agree with initial ED impression and plan with expectations set for ED visit. Labs Review Labs Reviewed  BASIC METABOLIC PANEL - Abnormal; Notable for the following:    BUN 37 (*)    Creatinine, Ser 1.67 (*)    GFR calc non Af Amer 38 (*)    GFR calc Af Amer 44 (*)  All other components within normal limits  CBC - Abnormal; Notable for the following:    MCHC 29.9 (*)    RDW 17.7 (*)    Platelets 129 (*)    All other components within normal limits  PRO B NATRIURETIC PEPTIDE  I-STAT TROPOININ, ED  I-STAT TROPOININ, ED    Imaging Review No results found.   EKG Interpretation   Date/Time:  Monday August 18 2014 21:40:17 EDT Ventricular Rate:  64 PR Interval:  194 QRS Duration: 100 QT Interval:  396 QTC Calculation: 408 R Axis:   -84 Text Interpretation:  Sinus rhythm Left anterior fascicular block Probable  anterolateral infarct, age indeterm Abnormal T, consider ischemia, lateral  leads No significant change since last tracing Confirmed by [facility]  MD,  Jenny Reichmann ([id number]) on 08/18/2014 9:47:53 PM Also confirmed by [facility]  MD, Jenny Reichmann  [phone]), editor [place]  CCT, BEVERLY ([id number])  on 08/19/2014 7:52:27 AM      MDM   Final diagnoses:  COPD with acute exacerbation  Community acquired pneumonia    Hand-off performed. Dispo pending.    Babette Relic, MD 09/07/14 2045

## 2014-08-18 NOTE — Discharge Instructions (Signed)
°  RETURN IMMEDIATELY IF you develop worse shortness of breath, confusion or altered mental status, a new rash, become dizzy, faint, or poorly responsive, or are unable to be cared for at home.

## 2014-08-18 NOTE — ED Notes (Signed)
Bed: GQ91 Expected date: 08/18/14 Expected time: 9:02 PM Means of arrival: Ambulance Comments: Short of breath

## 2014-08-18 NOTE — ED Notes (Signed)
Per EMS, pt has hx of COPD and in distress. No wheezes noted onsite but diminshed breath sounds in all quadrants. Pt is on 2L [place] at home at all times. Pt given 10 mg albuterol/1 mg Atrovent and 125 mg Solumedrol en route. Pt finishing neb upon arrival. Now able to speak in complete sentences.

## 2014-08-19 ENCOUNTER — Encounter (HOSPITAL_COMMUNITY): Payer: Medicare Other

## 2014-08-19 LAB — I-STAT TROPONIN, ED: TROPONIN I, POC: 0 ng/mL (ref 0.00–0.08)

## 2014-08-19 MED ORDER — AZITHROMYCIN 250 MG PO TABS: ORAL_TABLET | ORAL | Status: DC

## 2014-08-19 MED ORDER — AZITHROMYCIN 250 MG PO TABS
500.0000 mg | ORAL_TABLET | Freq: Every day | ORAL | Status: DC
  Administered 2014-08-19: 500 mg via ORAL
  Filled 2014-08-19: qty 2

## 2014-08-19 NOTE — ED Notes (Signed)
Pt's daughter updated (with pt's consent) on need for additional blood sample. Expressed understanding.

## 2014-08-19 NOTE — Progress Notes (Signed)
Pt reassessed per wheeze protocol.  Pt resting comfortably, no respiratory distress noted or voiced by pt.  Pt stated he is not sob, no wheezes auscultated.  Breath sounds diminished, but air movement noted in all lung fields.  Pt remains on 2lnc which is his home regimen.  Additional neb treatments not indicated at this time.

## 2014-08-19 NOTE — ED Provider Notes (Signed)
Patient was signed out as COPD exacerbation who was improved and ready for discharge.  CXR and repeat troponin is currently pending.  Repeat trop is negative.  CXR reveals new pneumonia.  Per family in the room, patient has recurrent pneumonia, this could have been the cause of his COPD exacerbation.  Will give azithro pack rx for treatment at home.  Patient state he is ready for DC, he is nontoxic appearing, laying comfortably in bed in NAD on his home 2L [place].  His O2 saturation is normally above 90%.  Vitals remain within his normal limits and he is safe for DC.  Everlene Balls, MD 08/19/14 [phone]

## 2014-08-21 ENCOUNTER — Encounter (HOSPITAL_COMMUNITY): Payer: Medicare Other

## 2014-08-21 ENCOUNTER — Other Ambulatory Visit: Payer: Self-pay | Admitting: *Deleted

## 2014-08-21 DIAGNOSIS — D45 Polycythemia vera: Secondary | ICD-10-CM

## 2014-08-22 ENCOUNTER — Other Ambulatory Visit: Payer: Medicare Other

## 2014-08-22 ENCOUNTER — Telehealth: Payer: Self-pay | Admitting: Oncology

## 2014-08-22 NOTE — Telephone Encounter (Signed)
pt cld to CX lab-pt stated will call back to r/s

## 2014-08-26 ENCOUNTER — Encounter (HOSPITAL_COMMUNITY): Payer: Medicare Other

## 2014-08-26 ENCOUNTER — Other Ambulatory Visit: Payer: Self-pay | Admitting: Internal Medicine

## 2014-08-28 ENCOUNTER — Encounter (HOSPITAL_COMMUNITY): Payer: Medicare Other

## 2014-09-02 ENCOUNTER — Encounter (HOSPITAL_COMMUNITY): Payer: Medicare Other

## 2014-09-04 ENCOUNTER — Encounter (HOSPITAL_COMMUNITY): Payer: Medicare Other

## 2014-09-09 ENCOUNTER — Encounter (HOSPITAL_COMMUNITY): Payer: Medicare Other

## 2014-09-17 ENCOUNTER — Encounter: Payer: Self-pay | Admitting: *Deleted

## 2014-09-19 ENCOUNTER — Ambulatory Visit: Payer: Medicare Other | Admitting: Nurse Practitioner

## 2014-09-19 ENCOUNTER — Other Ambulatory Visit: Payer: Medicare Other

## 2014-10-16 ENCOUNTER — Encounter (HOSPITAL_COMMUNITY): Payer: Self-pay | Admitting: Internal Medicine

## 2014-10-17 ENCOUNTER — Other Ambulatory Visit: Payer: Medicare Other

## 2014-11-04 ENCOUNTER — Encounter: Payer: Self-pay | Admitting: *Deleted

## 2014-11-14 ENCOUNTER — Other Ambulatory Visit: Payer: Medicare Other

## 2014-12-05 ENCOUNTER — Encounter: Payer: Self-pay | Admitting: *Deleted

## 2014-12-29 ENCOUNTER — Other Ambulatory Visit: Payer: Self-pay | Admitting: Oncology

## 2014-12-29 DIAGNOSIS — D45 Polycythemia vera: Secondary | ICD-10-CM

## 2014-12-30 ENCOUNTER — Telehealth: Payer: Self-pay | Admitting: Oncology

## 2014-12-30 ENCOUNTER — Telehealth: Payer: Self-pay | Admitting: *Deleted

## 2014-12-30 NOTE — Telephone Encounter (Signed)
This RN called pt to discuss need to schedule lab and visit for possible scheduling of phlebotomies.  Jesus Sanders verbalized understanding and agreement.  POF sent to scheduling.

## 2014-12-30 NOTE — Telephone Encounter (Signed)
s.w. pt and advised on March appt....pt ok and aware °

## 2015-01-01 ENCOUNTER — Encounter: Payer: Self-pay | Admitting: *Deleted

## 2015-01-13 ENCOUNTER — Other Ambulatory Visit: Payer: Self-pay | Admitting: *Deleted

## 2015-01-13 DIAGNOSIS — D45 Polycythemia vera: Secondary | ICD-10-CM

## 2015-01-14 ENCOUNTER — Other Ambulatory Visit (HOSPITAL_BASED_OUTPATIENT_CLINIC_OR_DEPARTMENT_OTHER): Payer: Medicare Other

## 2015-01-14 ENCOUNTER — Encounter: Payer: Self-pay | Admitting: Nurse Practitioner

## 2015-01-14 ENCOUNTER — Ambulatory Visit (HOSPITAL_BASED_OUTPATIENT_CLINIC_OR_DEPARTMENT_OTHER): Payer: Medicare Other | Admitting: Nurse Practitioner

## 2015-01-14 VITALS — BP 141/76 | HR 78 | Temp 97.7°F | Resp 22 | Ht 72.0 in | Wt 229.3 lb

## 2015-01-14 DIAGNOSIS — D45 Polycythemia vera: Secondary | ICD-10-CM

## 2015-01-14 DIAGNOSIS — R0602 Shortness of breath: Secondary | ICD-10-CM

## 2015-01-14 DIAGNOSIS — J449 Chronic obstructive pulmonary disease, unspecified: Secondary | ICD-10-CM

## 2015-01-14 DIAGNOSIS — Z86718 Personal history of other venous thrombosis and embolism: Secondary | ICD-10-CM

## 2015-01-14 LAB — COMPREHENSIVE METABOLIC PANEL (CC13)
ALBUMIN: 3.9 g/dL (ref 3.5–5.0)
ALT: 12 U/L (ref 0–55)
AST: 17 U/L (ref 5–34)
Alkaline Phosphatase: 96 U/L (ref 40–150)
Anion Gap: 10 mEq/L (ref 3–11)
BUN: 19.2 mg/dL (ref 7.0–26.0)
CALCIUM: 10 mg/dL (ref 8.4–10.4)
CHLORIDE: 105 meq/L (ref 98–109)
CO2: 28 mEq/L (ref 22–29)
CREATININE: 1.3 mg/dL (ref 0.7–1.3)
EGFR: 53 mL/min/{1.73_m2} — ABNORMAL LOW (ref >90)
Glucose: 131 mg/dl (ref 70–140)
POTASSIUM: 4.2 meq/L (ref 3.5–5.1)
Sodium: 144 mEq/L (ref 136–145)
TOTAL PROTEIN: 7.1 g/dL (ref 6.4–8.3)
Total Bilirubin: 0.97 mg/dL (ref 0.20–1.20)

## 2015-01-14 LAB — CBC WITH DIFFERENTIAL/PLATELET
BASO%: 0.2 % (ref 0.0–2.0)
BASOS ABS: 0 10*3/uL (ref 0.0–0.1)
EOS ABS: 0.1 10*3/uL (ref 0.0–0.5)
EOS%: 0.7 % (ref 0.0–7.0)
HEMATOCRIT: 53.4 % — AB (ref 38.4–49.9)
HGB: 16.4 g/dL (ref 13.0–17.1)
LYMPH%: 12 % — AB (ref 14.0–49.0)
MCH: 28.4 pg (ref 27.2–33.4)
MCHC: 30.7 g/dL — AB (ref 32.0–36.0)
MCV: 92.5 fL (ref 79.3–98.0)
MONO#: 0.6 10*3/uL (ref 0.1–0.9)
MONO%: 7.4 % (ref 0.0–14.0)
NEUT%: 79.7 % — AB (ref 39.0–75.0)
NEUTROS ABS: 6.7 10*3/uL — AB (ref 1.5–6.5)
Platelets: 156 10*3/uL (ref 140–400)
RBC: 5.77 10*6/uL (ref 4.20–5.82)
RDW: 15.6 % — AB (ref 11.0–14.6)
WBC: 8.4 10*3/uL (ref 4.0–10.3)
lymph#: 1 10*3/uL (ref 0.9–3.3)

## 2015-01-14 NOTE — Progress Notes (Signed)
ID: Jesus Sanders   DOB: 01-22-1936  MR#: [id number]  AOZ#:[id number]   PCP:  Leonard Downing, MD Other:  Peter Martinique, MD   HISTORY OF PRESENT ILLNESS:   Patient has a long-standing history of polycythemia vera, periodically requiring weekly therapeutic phlebotomy. This was diagnosed greater than 6 years ago. He also has multiple comorbidities which include COPD, multiple cardiac problems, history of DVT, and history of community-acquired pneumonia.   INTERVAL HISTORY:  Jesus Sanders returns today for follow up of his polycythemia vera.Since his last visit, he was admitted to the hospital again for shortness of breath. He uses 2 inhalers and mucinex for his chronic cough and shortness of breath, but the past few weeks it seems to be worse. He is wearing 2L O2 today.   REVIEW OF SYSTEMS:  [name] is doing well otherwise. He has no pain. He denies headaches, dizziness, fevers, or chills. He bruises easily, but no unexplained bleeding. He denies chest pain, or palpitations. He ambulates well without any assistive devices. He sleeps well and is energy level is good during the day. A detailed review of systems is otherwise stable.   PAST MEDICAL HISTORY: Past Medical History  Diagnosis Date  . Polycythemia vera(238.4)     a. Used to receive chronic phlebotomies until 2007, at regional [place].  will restart his phlebotomies from about Mar 15 2011  . Stomach ulcer   . Tobacco abuse     a. cigars  . Duodenal perforation June 2012  . Peritonitis June 2012  . Pneumonia April 2012  . Systolic CHF, chronic     a. 12/2011 Echo: EF 25-30%, mid-dist antsept/inf, apical AK, Gr 1 DD, Triv AI, Mild MR.  . Ischemic cardiomyopathy     a. 01/2012 S/P MDT Protecta single lead ICD, ser # [id number] H  . History of DVT (deep vein thrombosis)   . Chronic bronchitis   . GERD (gastroesophageal reflux disease)   . Myocardial infarction   . Shortness of breath   . COPD (chronic obstructive pulmonary  disease)   . Hypertension   . CAD (coronary artery disease)     a. 07/2011 Anterior apical STEMI/Cath/PCI: LM nl, LAD 100p/m (2.5 x 12mm Mini-Vision BMS), LCX 40p, RCA dominant, nl, EF 30%.    PAST SURGICAL HISTORY: Past Surgical History  Procedure Laterality Date  . Cholecystectomy  03/2011    Dr. Marlou Starks  . Colon surgery    . Cardiac catheterization  Sept 2012    Normal left main, occluded LAD, 40% LCX and normal RCA. EF is 30%  . US echocardiography  Sept 2012    EF 25 to 30% with akinesis of the mid to distal anterior and apical myocardium, trivial AI and no apical thrombus  . Cardiac defibrillator placement      single chamber  . Shoulder arthroscopy      left, rotatotor cuff tendinopathy  . Implantable cardioverter defibrillator implant N/A 01/11/2012    Procedure: IMPLANTABLE CARDIOVERTER DEFIBRILLATOR IMPLANT;  Surgeon: Deboraha Sprang, MD;  Location: [facility] CATH LAB;  Service: Cardiovascular;  Laterality: N/A;    FAMILY HISTORY Family History  Problem Relation Age of Onset  . Lung disease Father     [name]- worked at a [employer]: His niece works for a [employer] and she helps with his medications.   ADVANCED DIRECTIVES: Living will in place  HEALTH MAINTENANCE: History  Substance Use Topics  . Smoking status: Former Smoker -- 0.50 packs/day  for 60 years    Types: Cigars    Quit date: 05/18/2013  . Smokeless tobacco: Current User    Types: Chew  . Alcohol Use: Yes     Comment: rarely    No Known Allergies  Current Outpatient Prescriptions  Medication Sig Dispense Refill  . albuterol (PROVENTIL HFA;VENTOLIN HFA) 108 (90 BASE) MCG/ACT inhaler Inhale 2 puffs into the lungs every 6 (six) hours as needed for wheezing or shortness of breath. 18 g 6  . amLODipine (NORVASC) 10 MG tablet Take 1 tablet (10 mg total) by mouth daily. 30 tablet 0  . arformoterol (BROVANA) 15 MCG/2ML NEBU Take 2 mLs (15 mcg total) by nebulization 2 (two) times daily. Dx  491.9 120 mL 6  . aspirin EC 81 MG tablet Take 81 mg by mouth daily.    . budesonide (PULMICORT) 0.25 MG/2ML nebulizer solution Take 2 mLs (0.25 mg total) by nebulization 2 (two) times daily. Dx 491.9 120 mL 6  . carvedilol (COREG) 25 MG tablet Take 25 mg by mouth 2 (two) times daily with a meal.    . furosemide (LASIX) 40 MG tablet Take 40 mg by mouth daily.     . hydroxyurea (HYDREA) 500 MG capsule TAKE ONE CAPSULE BY MOUTH ONCE DAILY. MAY TAKE WITH FOOD TO MINIMIZE GI SIDE EFFECTS 30 capsule 0  . lovastatin (MEVACOR) 20 MG tablet Take 20 mg by mouth at bedtime.    Marland Kitchen omeprazole (PRILOSEC) 20 MG capsule Take 20 mg by mouth daily.    . valsartan (DIOVAN) 80 MG tablet TAKE ONE TABLET BY MOUTH ONCE DAILY 30 tablet 5  . dextromethorphan-guaiFENesin (MUCINEX DM) 30-600 MG per 12 hr tablet Take 1 tablet by mouth 2 (two) times daily as needed for cough.     No current facility-administered medications for this visit.    OBJECTIVE: Elderly white man wearing 2L O2 Filed Vitals:   01/14/15 1039  BP: 141/76  Pulse: 78  Temp: 97.7 F (36.5 C)  Resp: 22     Body mass index is 31.09 kg/(m^2).    ECOG FS: 2  Filed Weights   01/14/15 1039  Weight: 229 lb 4.8 oz (104.01 kg)   Skin: warm, dry  HEENT: sclerae anicteric, conjunctivae pink, oropharynx clear. No thrush or mucositis.  Lymph Nodes: No cervical or supraclavicular lymphadenopathy  Lungs: clear to auscultation bilaterally, no rales, wheezes, or rhonci, however patient is extremely short of breath Heart: regular rate and rhythm  Abdomen: round, soft, non tender, positive bowel sounds  Musculoskeletal: No focal spinal tenderness, no peripheral edema  Neuro: non focal, well oriented, positive affect    LAB RESULTS: Lab Results  Component Value Date   WBC 8.4 01/14/2015   NEUTROABS 6.7* 01/14/2015   HGB 16.4 01/14/2015   HCT 53.4* 01/14/2015   MCV 92.5 01/14/2015   PLT 156 01/14/2015      Chemistry      Component Value Date/Time    NA 137 08/18/2014 2210   NA 143 12/10/2013 1007   K 5.1 08/18/2014 2210   K 4.9 12/10/2013 1007   CL 102 08/18/2014 2210   CO2 26 08/18/2014 2210   CO2 28 12/10/2013 1007   BUN 37* 08/18/2014 2210   BUN 20.5 12/10/2013 1007   CREATININE 1.67* 08/18/2014 2210   CREATININE 1.3 12/10/2013 1007   CREATININE 1.57* 10/24/2012 1327      Component Value Date/Time   CALCIUM 9.5 08/18/2014 2210   CALCIUM 9.8 12/10/2013 1007   ALKPHOS  66 02/22/2014 0428   ALKPHOS 85 12/10/2013 1007   AST 11 02/22/2014 0428   AST 12 12/10/2013 1007   ALT 7 02/22/2014 0428   ALT 12 12/10/2013 1007   BILITOT 0.4 02/22/2014 0428   BILITOT 0.69 12/10/2013 1007        STUDIES: No results found.  ASSESSMENT: 79 y.o. [place] man with   (1)  a history of polycythemia vera treated with phlebotomy.  The goal is to phlebotomize once Mr. Faughn hematocrit is 50 or greater.  At that point, we phlebotomize until the hematocrit is at 40 or less.    (2)  Several comorbidities including community-acquired pneumonia involving the right lower lobe, history of recurrent deep vein thrombosis, history of tobacco abuse with chronic bronchitis, history of CVA and more recently history of STEMI and CHF, s/p defibrillator placement   (3) hydroxyurea started February 2015  PLAN:  Dalon believes his last visit with his PCP was 1 month ago, but I urged him to make a follow up appointment to see if more should not be done with his progressive shortness of breath.   The labs were reviewed in detail and his hct is 53.4. I consulted with Dr. Jana Hakim, and he agreed that a phlebotomy was indicated in this case.   Yair will return next week for a phlebotomy. The week following we will recheck labs and repeat the phlebotomy if necessary. Otherwise he will have a CBC performed every other month until his visit with Dr. Jana Hakim this August. He understands and agrees with this plan. He has been encouraged to call with any  issues that might arise before his next visit here.    Genelle Gather Nishika Parkhurst    01/14/2015

## 2015-01-21 ENCOUNTER — Ambulatory Visit (HOSPITAL_BASED_OUTPATIENT_CLINIC_OR_DEPARTMENT_OTHER): Payer: Medicare Other

## 2015-01-21 VITALS — BP 135/72 | HR 72 | Temp 97.7°F | Resp 18

## 2015-01-21 DIAGNOSIS — D45 Polycythemia vera: Secondary | ICD-10-CM

## 2015-01-21 NOTE — Patient Instructions (Signed)

## 2015-01-21 NOTE — Progress Notes (Signed)
Phlebotomy one unit of blood from right AC.  Pt tolerated well.  Ate snack after procedure.

## 2015-01-22 ENCOUNTER — Ambulatory Visit (INDEPENDENT_AMBULATORY_CARE_PROVIDER_SITE_OTHER): Payer: Medicare Other | Admitting: Internal Medicine

## 2015-01-22 ENCOUNTER — Encounter: Payer: Self-pay | Admitting: Internal Medicine

## 2015-01-22 VITALS — BP 122/64 | HR 83 | Ht 72.0 in | Wt 240.8 lb

## 2015-01-22 DIAGNOSIS — Z4502 Encounter for adjustment and management of automatic implantable cardiac defibrillator: Secondary | ICD-10-CM

## 2015-01-22 DIAGNOSIS — I255 Ischemic cardiomyopathy: Secondary | ICD-10-CM

## 2015-01-22 DIAGNOSIS — I5022 Chronic systolic (congestive) heart failure: Secondary | ICD-10-CM

## 2015-01-22 NOTE — Progress Notes (Signed)
Patient Care Team: Leonard Downing, MD as PCP - General (Family Medicine)   HPI  Jesus Sanders is a 79 y.o. male is seen in followup for an ICD implanted spring 2013 for ischemic cardiac myopathy prior MI and congestive failure.  The patient denies chest pain, shortness of breath, nocturnal dyspnea, orthopnea or peripheral edema. There have been no palpitations, lightheadedness or syncope.     He has chronic COPD-severe with home oxygen.  He denies chest pain or edema    He got lost to followup with Dr. Ledon Snare reset that for 2 months  He has a history of polycythemia. Last blood work in December demonstrated microcytic anemia  Past Medical History  Diagnosis Date  . Polycythemia vera(238.4)     a. Used to receive chronic phlebotomies until 2007, at regional [place].  will restart his phlebotomies from about Mar 15 2011  . Stomach ulcer   . Tobacco abuse     a. cigars  . Duodenal perforation June 2012  . Peritonitis June 2012  . Pneumonia April 2012  . Systolic CHF, chronic     a. 12/2011 Echo: EF 25-30%, mid-dist antsept/inf, apical AK, Gr 1 DD, Triv AI, Mild MR.  . Ischemic cardiomyopathy     a. 01/2012 S/P MDT Protecta single lead ICD, ser # [id number] H  . History of DVT (deep vein thrombosis)   . Chronic bronchitis   . GERD (gastroesophageal reflux disease)   . Myocardial infarction   . Shortness of breath   . COPD (chronic obstructive pulmonary disease)   . Hypertension   . CAD (coronary artery disease)     a. 07/2011 Anterior apical STEMI/Cath/PCI: LM nl, LAD 100p/m (2.5 x 108mm Mini-Vision BMS), LCX 40p, RCA dominant, nl, EF 30%.    Past Surgical History  Procedure Laterality Date  . Cholecystectomy  03/2011    Dr. Marlou Starks  . Colon surgery    . Cardiac catheterization  Sept 2012    Normal left main, occluded LAD, 40% LCX and normal RCA. EF is 30%  . US echocardiography  Sept 2012    EF 25 to 30% with akinesis of the mid to distal anterior and apical  myocardium, trivial AI and no apical thrombus  . Cardiac defibrillator placement      single chamber  . Shoulder arthroscopy      left, rotatotor cuff tendinopathy  . Implantable cardioverter defibrillator implant N/A 01/11/2012    Procedure: IMPLANTABLE CARDIOVERTER DEFIBRILLATOR IMPLANT;  Surgeon: Deboraha Sprang, MD;  Location: [facility] CATH LAB;  Service: Cardiovascular;  Laterality: N/A;    Current Outpatient Prescriptions  Medication Sig Dispense Refill  . albuterol (PROVENTIL HFA;VENTOLIN HFA) 108 (90 BASE) MCG/ACT inhaler Inhale 2 puffs into the lungs every 6 (six) hours as needed for wheezing or shortness of breath. 18 g 6  . amLODipine (NORVASC) 10 MG tablet Take 1 tablet (10 mg total) by mouth daily. 30 tablet 0  . arformoterol (BROVANA) 15 MCG/2ML NEBU Take 2 mLs (15 mcg total) by nebulization 2 (two) times daily. Dx 491.9 120 mL 6  . aspirin EC 81 MG tablet Take 81 mg by mouth daily.    . budesonide (PULMICORT) 0.25 MG/2ML nebulizer solution Take 2 mLs (0.25 mg total) by nebulization 2 (two) times daily. Dx 491.9 120 mL 6  . carvedilol (COREG) 25 MG tablet Take 25 mg by mouth 2 (two) times daily with a meal.    . dextromethorphan-guaiFENesin (MUCINEX DM) 30-600 MG per 12 hr  tablet Take 1 tablet by mouth 2 (two) times daily as needed for cough.    . furosemide (LASIX) 40 MG tablet Take 40 mg by mouth daily.     . hydroxyurea (HYDREA) 500 MG capsule TAKE ONE CAPSULE BY MOUTH ONCE DAILY. MAY TAKE WITH FOOD TO MINIMIZE GI SIDE EFFECTS 30 capsule 0  . lovastatin (MEVACOR) 20 MG tablet Take 20 mg by mouth at bedtime.    Marland Kitchen omeprazole (PRILOSEC) 20 MG capsule Take 20 mg by mouth daily.    . valsartan (DIOVAN) 80 MG tablet TAKE ONE TABLET BY MOUTH ONCE DAILY 30 tablet 5   No current facility-administered medications for this visit.    No Known Allergies  Review of Systems negative except from HPI and PMH  Physical Exam BP 122/64 mmHg  Pulse 83  Ht 6' (1.829 m)  Wt 109.226 kg (240 lb  12.8 oz)  BMI 32.65 kg/m2 Well developed and well nourished in no acute distress wearing O2 HENT normal E scleral and icterus clear Neck Supple Clear to ausculation  Regular rate and rhythm, no murmurs gallops or rub Soft with active bowel sounds No clubbing cyanosis 1+ Edema Alert and oriented, grossly normal motor and sensory function Skin Warm and Dry  Electrocardiogram dated today demonstrates sinus rhythm with a rate of 63 Interval 17/10/41 prior anterolateral infarct  Assessment and  Plan  Ischemic cardiomyopathy  Implantable defibrillator   congestive heart failure-chronic systolic   will have him follow-up with Dr. Shirlee More. He continues under the care of hematology for polycythemia and Dr. [name] for COPD. Device function is normal   Euvolemic continue current meds  The patient's device was interrogated.  The information was reviewed. No changes were made in the programming.

## 2015-01-22 NOTE — Patient Instructions (Signed)
Your physician recommends that you continue on your current medications as directed. Please refer to the Current Medication list given to you today.  Remote monitoring is used to monitor your Pacemaker of ICD from home. This monitoring reduces the number of office visits required to check your device to one time per year. It allows [name] to keep an eye on the functioning of your device to ensure it is working properly. You are scheduled for a device check from home on 04/23/15. You may send your transmission at any time that day. If you have a wireless device, the transmission will be sent automatically. After your physician reviews your transmission, you will receive a postcard with your next transmission date.  Your physician wants you to follow-up in: 6 months with Dr. Martinique. You will receive a reminder letter in the mail two months in advance. If you don't receive a letter, please call our office to schedule the follow-up appointment.  Your physician wants you to follow-up in: 1 year with Chanetta Marshall, NP.  You will receive a reminder letter in the mail two months in advance. If you don't receive a letter, please call our office to schedule the follow-up appointment.

## 2015-01-23 LAB — MDC_IDC_ENUM_SESS_TYPE_INCLINIC
Battery Voltage: 3.09 V
HighPow Impedance: 74 Ohm
Lead Channel Impedance Value: 646 Ohm
Lead Channel Setting Pacing Amplitude: 2.5 V
Lead Channel Setting Pacing Pulse Width: 0.4 ms
Lead Channel Setting Sensing Sensitivity: 0.3 mV
MDC IDC MSMT LEADCHNL RV PACING THRESHOLD AMPLITUDE: 0.5 V
MDC IDC MSMT LEADCHNL RV PACING THRESHOLD PULSEWIDTH: 0.4 ms
MDC IDC MSMT LEADCHNL RV SENSING INTR AMPL: 7 mV
MDC IDC STAT BRADY RV PERCENT PACED: 0.1 %
Zone Setting Detection Interval: 320 ms
Zone Setting Detection Interval: 360 ms
Zone Setting Detection Interval: 360 ms

## 2015-01-28 ENCOUNTER — Telehealth: Payer: Self-pay | Admitting: Oncology

## 2015-01-28 ENCOUNTER — Other Ambulatory Visit: Payer: Medicare Other

## 2015-01-28 ENCOUNTER — Encounter: Payer: Self-pay | Admitting: Internal Medicine

## 2015-01-28 NOTE — Telephone Encounter (Signed)
pt cld to r/s appt-gave pt updated sch

## 2015-01-28 NOTE — Telephone Encounter (Signed)
Jesus Sanders cld back to chge date to 3/29. Kept the same time @ 11:45

## 2015-02-02 ENCOUNTER — Other Ambulatory Visit: Payer: Medicare Other

## 2015-02-03 ENCOUNTER — Other Ambulatory Visit: Payer: Medicare Other

## 2015-02-14 ENCOUNTER — Other Ambulatory Visit: Payer: Self-pay | Admitting: Oncology

## 2015-02-16 ENCOUNTER — Other Ambulatory Visit: Payer: Self-pay | Admitting: *Deleted

## 2015-02-16 ENCOUNTER — Telehealth: Payer: Self-pay | Admitting: *Deleted

## 2015-02-16 ENCOUNTER — Telehealth: Payer: Self-pay | Admitting: Oncology

## 2015-02-16 NOTE — Telephone Encounter (Signed)
Per staff message and POF I have scheduled appts. Advised scheduler of appts. JMW  

## 2015-02-16 NOTE — Telephone Encounter (Signed)
Patient confirmed appointment for 04/15 lab and phlebotomy. Mailed calendar also will have chemo room give calendar as well due to having history per POF.

## 2015-02-17 ENCOUNTER — Ambulatory Visit (INDEPENDENT_AMBULATORY_CARE_PROVIDER_SITE_OTHER)
Admission: RE | Admit: 2015-02-17 | Discharge: 2015-02-17 | Disposition: A | Discharge status: Home / Self Care | Payer: Medicare Other | Source: Ambulatory Visit | Attending: Internal Medicine | Admitting: Internal Medicine

## 2015-02-17 ENCOUNTER — Ambulatory Visit (INDEPENDENT_AMBULATORY_CARE_PROVIDER_SITE_OTHER): Payer: Medicare Other | Admitting: Internal Medicine

## 2015-02-17 ENCOUNTER — Encounter: Payer: Self-pay | Admitting: Internal Medicine

## 2015-02-17 VITALS — BP 114/70 | HR 74 | Ht 72.0 in | Wt 234.2 lb

## 2015-02-17 DIAGNOSIS — R0600 Dyspnea, unspecified: Secondary | ICD-10-CM | POA: Insufficient documentation

## 2015-02-17 DIAGNOSIS — J449 Chronic obstructive pulmonary disease, unspecified: Secondary | ICD-10-CM

## 2015-02-17 DIAGNOSIS — I255 Ischemic cardiomyopathy: Secondary | ICD-10-CM

## 2015-02-17 DIAGNOSIS — J9611 Chronic respiratory failure with hypoxia: Secondary | ICD-10-CM | POA: Diagnosis not present

## 2015-02-17 DIAGNOSIS — I5022 Chronic systolic (congestive) heart failure: Secondary | ICD-10-CM | POA: Diagnosis not present

## 2015-02-17 NOTE — Patient Instructions (Addendum)
Be sure to take your furosemide when your legs/feet are swollen   Please remember to go to the lab and x-ray department downstairs for your tests - we will call you with the results when they are available.  Try to wear your 02 24/7 at 2lpm but especially at bedtime and with any activity more than room to room walking      See Tammy NP w/in 2 weeks with your grand daughter all your medications, even over the counter meds, separated in two separate bags, the ones you take no matter what vs the ones you stop once you feel better and take only as needed when you feel you need them.   Tammy  will generate for you a new user friendly medication calendar that will put [name] all on the same page re: your medication use.     Without this process, it simply isn't possible to assure that we are providing  your outpatient care  with  the attention to detail we feel you deserve.   If we cannot assure that you're getting that kind of care,  then we cannot manage your problem effectively from this clinic.  Once you have seen Tammy and we are sure that we're all on the same page with your medication use she will arrange follow up with me.  Late add: did not go to lab/ needs to do so on return

## 2015-02-17 NOTE — Progress Notes (Signed)
Subjective:    Patient ID: Jesus Sanders, male    DOB: [birth year]   MRN: [id number]    Brief patient profile:  84 yowm quit smoking 2014 admitted to [facility] with establish GOLD IV copd as of 04/03/14   Admit date: 02/21/2014  Discharge date: 02/23/2014     Discharge Diagnoses:  Acute-on-chronic respiratory failure   Polycythemia vera(238.4)  HTN (hypertension)  Ischemic cardiomyopathy  GERD (gastroesophageal reflux disease)  COPD exacerbation, likely         Filed Weights    02/21/14 1945  02/22/14 0502   Weight:  100.8 kg (222 lb 3.6 oz)  100.6 kg (221 lb 12.5 oz)   History of present illness:  79 y.o. male came to [facility] ed 02/21/2014 with shortness of breath. No history of polycythemia vera, CAD history 07/2011 anterior apical MI S./P. BMS to LAD resulting in ischemic cardiomyopathy with systolic CHF [systolic 95%] s/p defibrillator 01/2012, severe COPD FEV1/FVC 0.40, H/o DVt in 2001 and [place] to have AECOPD and admitted for the same  Hospital Course:  Acute-on-chronic respiratory failure-likely AECOPD-treat with inhaled nebulizers every 4 when necessary, add steroids to 60 mg prednisone . Patient educated regarding inhalers as he is only on albuterol which is a rescue inhaler but he thought that his nebulizer was to be used Q6 when necessary and inhaler was a controller med  RT to teach use of proper use of inhaler and when to use what  I will prescribe for him Advair forCOPD Gold stage IV at least . He will need a burst of steroids for about 5-7 days and anticipate he will do well.might benefit from Cardio-pulm rehab  Polycythemia vera(238.4) currently with anemia presently-currently on Hydrea and gets prophylactic phlebotomy. To keep his hematocrit less than 40 which is clearly not at that . hydroxyurea was restarted February 2015. He will followup with his outpatient oncologist when needed  HTN (hypertension)-continue the medications as below. Blood pressure only moderately  controlled currently. Increase Lisinopril or add Amlodpine if still elevated in am  Ischemic cardiomyopathy, CAD history , placement of defibrillator 2012-keep on telemetry. Currently doing well -may benefit from alternative agent other than Coreg 25 mg as has COPD . Continue ACE inhibitor lisinopril 10 daily , continue aspirin 81 mg daily  GERD (gastroesophageal reflux disease) continue Protonix in place of Prilosec  History of DVT 2001-completed therapy with Coumadin  History CVA-stable currently  acute kidney injury BN/creatinine 25/1.24. Labs about the same monitor in the am. Lasix held initially but re-started. Will need close monitoring of bmet as OP  Hyperlipidemia-hold statin for now   05/16/2014 1st [facility] office visit/ [name] / [place] IV COPD  Chief Complaint  Patient presents with  . Pulmoanary Consult    Pt states that he was referred per Cardiac Rehab.  He c/o DOE for "I don't know, several years".  Occurs when "I do too much".     indolent onset doe x sev years to point where has trouble to mb and back flat has to stop half way back but not every day Also has it sitting still  Also has it also sometimes lying down Can do all but the most rigorous ex at rehab s 02  Better on dulera 100 2bid but not sure of what all meds he's using.   >>d/c ACE and rx Diovan    06/02/2014 Np note  Patient returns for a followup and medication review. Unfortunately, pt did not bring  any meds with him today.  He brought his pill box , his grand daughter does his meds.  He does not know any of his meds.  He does not have any prescription coverage.  Discussed with grandaughter on phone , can not afford inhalers.  Goes to cardiopulmonary  rehab. Uses Oxygen with exercise.  Not taking Dulera since last ov.   Reports breathing is somewhat worse since last ov with SOB, wheezing, thorat congestion. Last visit. Patient was changed off of his ACE inhibitor and started on Diovan. Gets short of  breath with minimal activity .  O2 sats 94% at rest  Walking O2 sats 87%.  Patient denies any hemoptysis, chest pain, orthopnea, abdominal pain, nausea, vomiting, or leg swelling.  >>started on O2. With walking. rx brovana/pulmicort neb    06/23/2014   NP note Last ov was started on pulmicort and brovana (he does not have rx coverage). And started on O2 with walking for desats.  He appears to be taking meds correctly. Says he breathing is stable with no flare in cough or wheezing.  rec Follow med calendar closely and bring to each visit.  Continue on current regimen .  Change to Mucinex DM Twice daily  As needed  Cough/congestion  May use Albuterol Neb every 4hrs as needed for wheezing/shortness of breath -this is your rescue medicine.  Continue with Oxygen with walking.    02/17/2015 f/u ov/Marjarie Irion re: GOLD IV copd/ 02 dep/ no med cal/ no 02/no grand daughter, thoroughly confused with details of care  Chief Complaint  Patient presents with  . Follow-up    Pt states breathing is progressively worse since his last visit here. He was SOB walking from lobby to exam room today. He states SOB sometimes at rest. He also c/o clearing his throat often.      No obvious day to day or daytime variabilty or assoc excess or purulent mucus  or cp or chest tightness, subjective wheeze overt sinus or hb symptoms. No unusual exp hx or h/o childhood pna/ asthma or knowledge of premature birth.  Sleeping ok without nocturnal  or early am exacerbation  of respiratory  c/o's or need for noct saba. Also denies any obvious fluctuation of symptoms with weather or environmental changes or other aggravating or alleviating factors except as outlined above   Current Medications, Allergies, Complete Past Medical History, Past Surgical History, Family History, and Social History were reviewed in [employer] record.  ROS  The following are not active complaints unless bolded sore throat,  dysphagia, dental problems, itching, sneezing,  nasal congestion or excess/ purulent secretions, ear ache,   fever, chills, sweats, unintended wt loss, pleuritic or exertional cp, hemoptysis,  orthopnea pnd or leg swelling (says his furosemide is as needed, which is not the way it is listed) , presyncope, palpitations, heartburn, abdominal pain, anorexia, nausea, vomiting, diarrhea  or change in bowel or urinary habits, change in stools or urine, dysuria,hematuria,  rash, arthralgias, visual complaints, headache, numbness weakness or ataxia or problems with walking or coordination,  change in mood/affect or memory.                        Objective:   Physical Exam  Wt Readings from Last 3 Encounters:  02/17/15 234 lb 3.2 oz (106.232 kg)  01/22/15 240 lb 12.8 oz (109.226 kg)  01/14/15 229 lb 4.8 oz (104.01 kg)    Vital signs reviewed  amb elderly wm nad  HEENT mild turbinate edema.  Oropharynx no thrush or excess pnd or cobblestoning.  No JVD or cervical adenopathy. Mild accessory muscle hypertrophy. Trachea midline, nl thryroid. Chest was hyperinflated by percussion with diminished breath sounds and moderate increased exp time without wheeze. Regular rate and rhythm without murmur gallop or rub or increase P2  - 1+ pitting edema both lower ext .  Abd: no hsm, nl excursion. Ext warm without cyanosis or clubbing.     CXR PA and Lateral:   02/17/2015 :     I personally reviewed images and agree with radiology impression as follows:    1. Emphysema. No active process. 2. Probable scarring at both lung bases.  Labs ordered 02/17/15 Bmet/ bnp/ cbc > did not go to lab as requested       Assessment & Plan:

## 2015-02-18 ENCOUNTER — Encounter: Payer: Self-pay | Admitting: Internal Medicine

## 2015-02-18 NOTE — Assessment & Plan Note (Signed)
-  ambulatory desats 87% on RA >O2 w/ act 06/02/2014  -ONO 06/02/2014 >> never completed  - Sat on arrival RA 02/17/2015 = 88% - 02/17/2015  Walked 2lpm x 1 laps = 185 ft   stopped due to sob/ nl pace/ no desat    rec 02 2lpm 24/7 as of 7/71/16   Complicated by polycythemia so should be on 02 24/7

## 2015-02-18 NOTE — Assessment & Plan Note (Signed)
Needs to complete the w/u on return (labs not done)

## 2015-02-18 NOTE — Assessment & Plan Note (Signed)
PFT's 04/03/14 FEV1  0.90 (27%) ratio 38 and DLCO 19% corrects to 30   F/v physiologic - spirometry 05/17/14  FEV1  0.91 (26%) ratio 42 but f/v non physiologic -  Med calendar 06/23/2014 > not using as of 02/17/15    I had an extended discussion with the patient reviewing all relevant studies completed to date and  lasting 15 to 20 minutes of a 25 minute visit on the following ongoing concerns:  1) if not able to do meaningful and accurate med reconciliation we will not be able to continue to provide quality care to this pt  2)  To keep things simple, I have asked the patient and grand-daughter  to first separate medicines that are perceived as maintenance, that is to be taken daily "no matter what", from those medicines that are taken on only on an as-needed basis and I have given the patient examples of both, and then return to see our NP to generate a  detailed  medication calendar which should be followed until the next physician sees the patient and updates it.

## 2015-02-18 NOTE — Assessment & Plan Note (Signed)
Apparently the thinks his furosemide is prn but if so has poor perception when he needs it > demonstrated pitting today   Labs ordered by did not go as requested

## 2015-02-20 ENCOUNTER — Other Ambulatory Visit: Payer: Medicare Other

## 2015-02-27 ENCOUNTER — Ambulatory Visit (HOSPITAL_BASED_OUTPATIENT_CLINIC_OR_DEPARTMENT_OTHER): Payer: Medicare Other

## 2015-02-27 ENCOUNTER — Other Ambulatory Visit (HOSPITAL_BASED_OUTPATIENT_CLINIC_OR_DEPARTMENT_OTHER): Payer: Medicare Other

## 2015-02-27 VITALS — BP 105/58 | HR 72 | Temp 98.1°F | Resp 28

## 2015-02-27 DIAGNOSIS — D45 Polycythemia vera: Secondary | ICD-10-CM | POA: Diagnosis present

## 2015-02-27 LAB — CBC WITH DIFFERENTIAL/PLATELET
BASO%: 0.1 % (ref 0.0–2.0)
Basophils Absolute: 0 10*3/uL (ref 0.0–0.1)
EOS ABS: 0.1 10*3/uL (ref 0.0–0.5)
EOS%: 1.5 % (ref 0.0–7.0)
HCT: 49.1 % (ref 38.4–49.9)
HGB: 15.6 g/dL (ref 13.0–17.1)
LYMPH%: 14.1 % (ref 14.0–49.0)
MCH: 29.1 pg (ref 27.2–33.4)
MCHC: 31.8 g/dL — AB (ref 32.0–36.0)
MCV: 91.6 fL (ref 79.3–98.0)
MONO#: 0.7 10*3/uL (ref 0.1–0.9)
MONO%: 9.3 % (ref 0.0–14.0)
NEUT%: 75 % (ref 39.0–75.0)
NEUTROS ABS: 6 10*3/uL (ref 1.5–6.5)
NRBC: 0 % (ref 0–0)
PLATELETS: 136 10*3/uL — AB (ref 140–400)
RBC: 5.36 10*6/uL (ref 4.20–5.82)
RDW: 16.5 % — ABNORMAL HIGH (ref 11.0–14.6)
WBC: 8 10*3/uL (ref 4.0–10.3)
lymph#: 1.1 10*3/uL (ref 0.9–3.3)

## 2015-02-27 NOTE — Patient Instructions (Signed)

## 2015-02-27 NOTE — Progress Notes (Signed)
[id number] phlebotomized 505 g from R antecubital using 16 g phlebotomy set. Pt tolerated well. Snack given. 30 min observation.

## 2015-03-03 ENCOUNTER — Encounter: Payer: Medicare Other | Admitting: Adult Health

## 2015-03-05 ENCOUNTER — Other Ambulatory Visit: Payer: Self-pay | Admitting: *Deleted

## 2015-03-05 DIAGNOSIS — D45 Polycythemia vera: Secondary | ICD-10-CM

## 2015-03-06 ENCOUNTER — Other Ambulatory Visit: Payer: Medicare Other

## 2015-03-13 ENCOUNTER — Other Ambulatory Visit: Payer: Medicare Other

## 2015-03-20 ENCOUNTER — Other Ambulatory Visit: Payer: Self-pay | Admitting: Oncology

## 2015-04-10 ENCOUNTER — Other Ambulatory Visit: Payer: Self-pay | Admitting: Nurse Practitioner

## 2015-04-15 ENCOUNTER — Other Ambulatory Visit: Payer: Medicare Other

## 2015-04-22 ENCOUNTER — Other Ambulatory Visit: Payer: Self-pay | Admitting: *Deleted

## 2015-04-22 ENCOUNTER — Telehealth: Payer: Self-pay | Admitting: Oncology

## 2015-04-22 NOTE — Progress Notes (Signed)
This RN called pt's home and left a message requesting a return call regarding missed appointments.  POF sent to scheduling to reschedule appointment and document communication due to noted [place].

## 2015-04-22 NOTE — Telephone Encounter (Signed)
Called and left a message to call and reschedule his missed lab and phlebot appointment

## 2015-04-23 ENCOUNTER — Ambulatory Visit (INDEPENDENT_AMBULATORY_CARE_PROVIDER_SITE_OTHER): Payer: Medicare Other | Admitting: *Deleted

## 2015-04-23 DIAGNOSIS — I5022 Chronic systolic (congestive) heart failure: Secondary | ICD-10-CM | POA: Diagnosis not present

## 2015-04-23 DIAGNOSIS — I255 Ischemic cardiomyopathy: Secondary | ICD-10-CM | POA: Diagnosis not present

## 2015-04-23 NOTE — Progress Notes (Signed)
Remote ICD transmission.   

## 2015-04-26 LAB — CUP PACEART REMOTE DEVICE CHECK
Date Time Interrogation Session: 20160616052207
HIGH POWER IMPEDANCE MEASURED VALUE: 551 Ohm
HighPow Impedance: 71 Ohm
Lead Channel Impedance Value: 589 Ohm
Lead Channel Pacing Threshold Amplitude: 0.5 V
Lead Channel Pacing Threshold Pulse Width: 0.4 ms
Lead Channel Setting Pacing Pulse Width: 0.4 ms
MDC IDC MSMT BATTERY VOLTAGE: 3.11 V
MDC IDC MSMT LEADCHNL RV SENSING INTR AMPL: 5.75 mV
MDC IDC MSMT LEADCHNL RV SENSING INTR AMPL: 5.75 mV
MDC IDC SET LEADCHNL RV PACING AMPLITUDE: 2.5 V
MDC IDC SET LEADCHNL RV SENSING SENSITIVITY: 0.3 mV
MDC IDC STAT BRADY RV PERCENT PACED: 0.07 %
Zone Setting Detection Interval: 320 ms
Zone Setting Detection Interval: 360 ms
Zone Setting Detection Interval: 360 ms

## 2015-05-01 ENCOUNTER — Other Ambulatory Visit: Payer: Self-pay | Admitting: Internal Medicine

## 2015-05-04 ENCOUNTER — Other Ambulatory Visit: Payer: Self-pay | Admitting: Oncology

## 2015-05-06 ENCOUNTER — Other Ambulatory Visit: Payer: Self-pay | Admitting: Internal Medicine

## 2015-05-15 ENCOUNTER — Encounter: Payer: Self-pay | Admitting: Cardiology

## 2015-05-21 ENCOUNTER — Encounter: Payer: Self-pay | Admitting: Internal Medicine

## 2015-06-03 ENCOUNTER — Other Ambulatory Visit: Payer: Self-pay | Admitting: *Deleted

## 2015-06-03 ENCOUNTER — Other Ambulatory Visit: Payer: Medicare Other

## 2015-06-04 ENCOUNTER — Other Ambulatory Visit: Payer: Self-pay | Admitting: Internal Medicine

## 2015-06-05 ENCOUNTER — Telehealth: Payer: Self-pay | Admitting: *Deleted

## 2015-06-05 NOTE — Telephone Encounter (Signed)
This RN attempted to contact pt with same outcome of phone number stating mail box is full.  This RN called and left message on daughter's number per verified VM requesting a return call from her or her father regarding appointments needed by this office.

## 2015-06-05 NOTE — Telephone Encounter (Signed)
-----   Message from Barbera Setters sent at 06/05/2015  9:48 AM EDT ----- Per pof patient missed lab/phlebotomy 07/27. I have call patient multiple time with no response and voice mail full. Please advise  Thank you

## 2015-06-11 ENCOUNTER — Encounter: Payer: Self-pay | Admitting: Cardiology

## 2015-06-27 ENCOUNTER — Other Ambulatory Visit: Payer: Self-pay | Admitting: Oncology

## 2015-07-08 ENCOUNTER — Ambulatory Visit: Payer: Medicare Other | Admitting: Oncology

## 2015-07-08 ENCOUNTER — Other Ambulatory Visit: Payer: Medicare Other

## 2015-07-19 ENCOUNTER — Encounter (HOSPITAL_COMMUNITY): Payer: Self-pay | Admitting: Emergency Medicine

## 2015-07-19 ENCOUNTER — Emergency Department (HOSPITAL_COMMUNITY): Payer: Medicare Other

## 2015-07-19 ENCOUNTER — Inpatient Hospital Stay (HOSPITAL_COMMUNITY)
Admission: EM | Admit: 2015-07-19 | Discharge: 2015-07-21 | DRG: 189 | Disposition: A | Discharge status: Home / Self Care | Payer: Medicare Other | Attending: Internal Medicine | Admitting: Internal Medicine

## 2015-07-19 DIAGNOSIS — J9600 Acute respiratory failure, unspecified whether with hypoxia or hypercapnia: Secondary | ICD-10-CM | POA: Diagnosis not present

## 2015-07-19 DIAGNOSIS — K219 Gastro-esophageal reflux disease without esophagitis: Secondary | ICD-10-CM | POA: Diagnosis present

## 2015-07-19 DIAGNOSIS — E780 Pure hypercholesterolemia: Secondary | ICD-10-CM | POA: Diagnosis present

## 2015-07-19 DIAGNOSIS — Z79899 Other long term (current) drug therapy: Secondary | ICD-10-CM | POA: Diagnosis not present

## 2015-07-19 DIAGNOSIS — I714 Abdominal aortic aneurysm, without rupture: Secondary | ICD-10-CM | POA: Diagnosis present

## 2015-07-19 DIAGNOSIS — I724 Aneurysm of artery of lower extremity: Secondary | ICD-10-CM

## 2015-07-19 DIAGNOSIS — J439 Emphysema, unspecified: Secondary | ICD-10-CM | POA: Diagnosis present

## 2015-07-19 DIAGNOSIS — Z9861 Coronary angioplasty status: Secondary | ICD-10-CM | POA: Diagnosis not present

## 2015-07-19 DIAGNOSIS — D45 Polycythemia vera: Secondary | ICD-10-CM | POA: Diagnosis present

## 2015-07-19 DIAGNOSIS — M7989 Other specified soft tissue disorders: Secondary | ICD-10-CM | POA: Diagnosis not present

## 2015-07-19 DIAGNOSIS — Z95810 Presence of automatic (implantable) cardiac defibrillator: Secondary | ICD-10-CM | POA: Diagnosis not present

## 2015-07-19 DIAGNOSIS — J9601 Acute respiratory failure with hypoxia: Secondary | ICD-10-CM | POA: Diagnosis not present

## 2015-07-19 DIAGNOSIS — J441 Chronic obstructive pulmonary disease with (acute) exacerbation: Secondary | ICD-10-CM | POA: Diagnosis present

## 2015-07-19 DIAGNOSIS — J9621 Acute and chronic respiratory failure with hypoxia: Principal | ICD-10-CM | POA: Diagnosis present

## 2015-07-19 DIAGNOSIS — F17220 Nicotine dependence, chewing tobacco, uncomplicated: Secondary | ICD-10-CM | POA: Diagnosis present

## 2015-07-19 DIAGNOSIS — E785 Hyperlipidemia, unspecified: Secondary | ICD-10-CM | POA: Diagnosis not present

## 2015-07-19 DIAGNOSIS — I2510 Atherosclerotic heart disease of native coronary artery without angina pectoris: Secondary | ICD-10-CM | POA: Diagnosis present

## 2015-07-19 DIAGNOSIS — I2782 Chronic pulmonary embolism: Secondary | ICD-10-CM | POA: Diagnosis present

## 2015-07-19 DIAGNOSIS — Z7952 Long term (current) use of systemic steroids: Secondary | ICD-10-CM | POA: Diagnosis not present

## 2015-07-19 DIAGNOSIS — I10 Essential (primary) hypertension: Secondary | ICD-10-CM | POA: Diagnosis present

## 2015-07-19 DIAGNOSIS — Z9114 Patient's other noncompliance with medication regimen: Secondary | ICD-10-CM | POA: Diagnosis present

## 2015-07-19 DIAGNOSIS — J9690 Respiratory failure, unspecified, unspecified whether with hypoxia or hypercapnia: Secondary | ICD-10-CM | POA: Diagnosis present

## 2015-07-19 DIAGNOSIS — I255 Ischemic cardiomyopathy: Secondary | ICD-10-CM | POA: Diagnosis present

## 2015-07-19 DIAGNOSIS — I5022 Chronic systolic (congestive) heart failure: Secondary | ICD-10-CM

## 2015-07-19 DIAGNOSIS — Z86718 Personal history of other venous thrombosis and embolism: Secondary | ICD-10-CM

## 2015-07-19 DIAGNOSIS — Z7982 Long term (current) use of aspirin: Secondary | ICD-10-CM

## 2015-07-19 DIAGNOSIS — I712 Thoracic aortic aneurysm, without rupture: Secondary | ICD-10-CM | POA: Diagnosis present

## 2015-07-19 DIAGNOSIS — F17290 Nicotine dependence, other tobacco product, uncomplicated: Secondary | ICD-10-CM | POA: Diagnosis present

## 2015-07-19 DIAGNOSIS — I252 Old myocardial infarction: Secondary | ICD-10-CM

## 2015-07-19 LAB — CBC WITH DIFFERENTIAL/PLATELET
BASOS ABS: 0 10*3/uL (ref 0.0–0.1)
BASOS PCT: 0 % (ref 0–1)
Eosinophils Absolute: 0 10*3/uL (ref 0.0–0.7)
Eosinophils Relative: 0 % (ref 0–5)
HEMATOCRIT: 50.1 % (ref 39.0–52.0)
Hemoglobin: 15.4 g/dL (ref 13.0–17.0)
Lymphocytes Relative: 3 % — ABNORMAL LOW (ref 12–46)
Lymphs Abs: 0.5 10*3/uL — ABNORMAL LOW (ref 0.7–4.0)
MCH: 28.4 pg (ref 26.0–34.0)
MCHC: 30.7 g/dL (ref 30.0–36.0)
MCV: 92.3 fL (ref 78.0–100.0)
MONO ABS: 0.2 10*3/uL (ref 0.1–1.0)
Monocytes Relative: 1 % — ABNORMAL LOW (ref 3–12)
NEUTROS ABS: 16.7 10*3/uL — AB (ref 1.7–7.7)
Neutrophils Relative %: 96 % — ABNORMAL HIGH (ref 43–77)
PLATELETS: 138 10*3/uL — AB (ref 150–400)
RBC: 5.43 MIL/uL (ref 4.22–5.81)
RDW: 14.8 % (ref 11.5–15.5)
WBC: 17.5 10*3/uL — AB (ref 4.0–10.5)

## 2015-07-19 LAB — COMPREHENSIVE METABOLIC PANEL
ALBUMIN: 3.5 g/dL (ref 3.5–5.0)
ALT: 11 U/L — ABNORMAL LOW (ref 17–63)
AST: 19 U/L (ref 15–41)
Alkaline Phosphatase: 78 U/L (ref 38–126)
Anion gap: 8 (ref 5–15)
BILIRUBIN TOTAL: 0.6 mg/dL (ref 0.3–1.2)
BUN: 25 mg/dL — AB (ref 6–20)
CHLORIDE: 102 mmol/L (ref 101–111)
CO2: 25 mmol/L (ref 22–32)
Calcium: 8.9 mg/dL (ref 8.9–10.3)
Creatinine, Ser: 1.29 mg/dL — ABNORMAL HIGH (ref 0.61–1.24)
GFR calc Af Amer: >60 mL/min (ref >60)
GFR calc non Af Amer: 52 mL/min — ABNORMAL LOW (ref >60)
GLUCOSE: 127 mg/dL — AB (ref 65–99)
POTASSIUM: 5.1 mmol/L (ref 3.5–5.1)
Sodium: 135 mmol/L (ref 135–145)
Total Protein: 6.5 g/dL (ref 6.5–8.1)

## 2015-07-19 LAB — BRAIN NATRIURETIC PEPTIDE: B NATRIURETIC PEPTIDE 5: 52.3 pg/mL (ref 0.0–100.0)

## 2015-07-19 LAB — I-STAT TROPONIN, ED: Troponin i, poc: 0 ng/mL (ref 0.00–0.08)

## 2015-07-19 LAB — TROPONIN I: Troponin I: <0.03 ng/mL (ref <0.031)

## 2015-07-19 MED ORDER — LORATADINE 10 MG PO TABS
10.0000 mg | ORAL_TABLET | Freq: Every day | ORAL | Status: DC
  Administered 2015-07-19 – 2015-07-21 (×3): 10 mg via ORAL
  Filled 2015-07-19 (×3): qty 1

## 2015-07-19 MED ORDER — LEVOFLOXACIN IN D5W 750 MG/150ML IV SOLN
750.0000 mg | Freq: Once | INTRAVENOUS | Status: AC
  Administered 2015-07-19: 750 mg via INTRAVENOUS
  Filled 2015-07-19: qty 150

## 2015-07-19 MED ORDER — METHYLPREDNISOLONE SODIUM SUCC 125 MG IJ SOLR
60.0000 mg | Freq: Three times a day (TID) | INTRAMUSCULAR | Status: DC
  Administered 2015-07-19 – 2015-07-20 (×2): 60 mg via INTRAVENOUS
  Filled 2015-07-19 (×2): qty 0.96
  Filled 2015-07-19 (×2): qty 2
  Filled 2015-07-19: qty 0.96

## 2015-07-19 MED ORDER — AMLODIPINE BESYLATE 10 MG PO TABS
10.0000 mg | ORAL_TABLET | Freq: Every day | ORAL | Status: DC
Start: 2015-07-20 — End: 2015-07-21
  Administered 2015-07-20 – 2015-07-21 (×2): 10 mg via ORAL
  Filled 2015-07-19 (×2): qty 1

## 2015-07-19 MED ORDER — SODIUM CHLORIDE 0.9 % IJ SOLN
3.0000 mL | Freq: Two times a day (BID) | INTRAMUSCULAR | Status: DC
  Administered 2015-07-19: 3 mL via INTRAVENOUS
  Administered 2015-07-20: 10 mL via INTRAVENOUS

## 2015-07-19 MED ORDER — ALBUTEROL SULFATE (2.5 MG/3ML) 0.083% IN NEBU: 2.5000 mg | INHALATION_SOLUTION | RESPIRATORY_TRACT | Status: DC | PRN

## 2015-07-19 MED ORDER — CETYLPYRIDINIUM CHLORIDE 0.05 % MT LIQD
7.0000 mL | Freq: Two times a day (BID) | OROMUCOSAL | Status: DC
  Administered 2015-07-20 – 2015-07-21 (×3): 7 mL via OROMUCOSAL

## 2015-07-19 MED ORDER — METOPROLOL TARTRATE 25 MG PO TABS
25.0000 mg | ORAL_TABLET | Freq: Two times a day (BID) | ORAL | Status: DC
  Administered 2015-07-19 – 2015-07-21 (×4): 25 mg via ORAL
  Filled 2015-07-19 (×5): qty 1

## 2015-07-19 MED ORDER — IPRATROPIUM-ALBUTEROL 0.5-2.5 (3) MG/3ML IN SOLN
3.0000 mL | RESPIRATORY_TRACT | Status: DC
  Filled 2015-07-19: qty 3

## 2015-07-19 MED ORDER — IRBESARTAN 75 MG PO TABS
75.0000 mg | ORAL_TABLET | Freq: Every day | ORAL | Status: DC
  Administered 2015-07-20 – 2015-07-21 (×2): 75 mg via ORAL
  Filled 2015-07-19 (×2): qty 1

## 2015-07-19 MED ORDER — LEVOFLOXACIN IN D5W 750 MG/150ML IV SOLN
750.0000 mg | INTRAVENOUS | Status: DC
Start: 2015-07-19 — End: 2015-07-21
  Administered 2015-07-19 – 2015-07-20 (×2): 750 mg via INTRAVENOUS
  Filled 2015-07-19 (×3): qty 150

## 2015-07-19 MED ORDER — FUROSEMIDE 40 MG PO TABS
40.0000 mg | ORAL_TABLET | Freq: Every day | ORAL | Status: DC
  Administered 2015-07-20 – 2015-07-21 (×2): 40 mg via ORAL
  Filled 2015-07-19 (×2): qty 1

## 2015-07-19 MED ORDER — ONDANSETRON HCL 4 MG PO TABS: 4.0000 mg | ORAL_TABLET | Freq: Four times a day (QID) | ORAL | Status: DC | PRN

## 2015-07-19 MED ORDER — SODIUM CHLORIDE 0.9 % IV SOLN
250.0000 mL | INTRAVENOUS | Status: DC | PRN
  Administered 2015-07-19: 250 mL via INTRAVENOUS

## 2015-07-19 MED ORDER — ONDANSETRON HCL 4 MG/2ML IJ SOLN: 4.0000 mg | Freq: Four times a day (QID) | INTRAMUSCULAR | Status: DC | PRN

## 2015-07-19 MED ORDER — ACETAMINOPHEN 650 MG RE SUPP: 650.0000 mg | Freq: Four times a day (QID) | RECTAL | Status: DC | PRN

## 2015-07-19 MED ORDER — METHYLPREDNISOLONE SODIUM SUCC 125 MG IJ SOLR
125.0000 mg | Freq: Once | INTRAMUSCULAR | Status: AC
Start: 2015-07-19 — End: 2015-07-19
  Administered 2015-07-19: 125 mg via INTRAVENOUS
  Filled 2015-07-19: qty 2

## 2015-07-19 MED ORDER — GUAIFENESIN ER 600 MG PO TB12
1200.0000 mg | ORAL_TABLET | Freq: Two times a day (BID) | ORAL | Status: DC
  Administered 2015-07-19 – 2015-07-21 (×4): 1200 mg via ORAL
  Filled 2015-07-19 (×5): qty 2

## 2015-07-19 MED ORDER — IPRATROPIUM-ALBUTEROL 0.5-2.5 (3) MG/3ML IN SOLN
3.0000 mL | Freq: Four times a day (QID) | RESPIRATORY_TRACT | Status: DC
  Administered 2015-07-20 (×4): 3 mL via RESPIRATORY_TRACT
  Filled 2015-07-19 (×4): qty 3

## 2015-07-19 MED ORDER — ALBUTEROL (5 MG/ML) CONTINUOUS INHALATION SOLN
10.0000 mg/h | INHALATION_SOLUTION | RESPIRATORY_TRACT | Status: DC
  Administered 2015-07-19: 10 mg/h via RESPIRATORY_TRACT
  Filled 2015-07-19: qty 20

## 2015-07-19 MED ORDER — ENOXAPARIN SODIUM 40 MG/0.4ML ~~LOC~~ SOLN
40.0000 mg | SUBCUTANEOUS | Status: DC
  Administered 2015-07-19 – 2015-07-20 (×2): 40 mg via SUBCUTANEOUS
  Filled 2015-07-19 (×3): qty 0.4

## 2015-07-19 MED ORDER — POLYETHYLENE GLYCOL 3350 17 G PO PACK
17.0000 g | PACK | Freq: Every day | ORAL | Status: DC
  Administered 2015-07-19 – 2015-07-21 (×3): 17 g via ORAL
  Filled 2015-07-19 (×3): qty 1

## 2015-07-19 MED ORDER — GUAIFENESIN-DM 100-10 MG/5ML PO SYRP: 5.0000 mL | ORAL_SOLUTION | ORAL | Status: DC | PRN

## 2015-07-19 MED ORDER — PRAVASTATIN SODIUM 20 MG PO TABS
20.0000 mg | ORAL_TABLET | Freq: Every day | ORAL | Status: DC
  Administered 2015-07-19 – 2015-07-20 (×2): 20 mg via ORAL
  Filled 2015-07-19 (×2): qty 1

## 2015-07-19 MED ORDER — FLUTICASONE PROPIONATE 50 MCG/ACT NA SUSP
2.0000 | Freq: Every day | NASAL | Status: DC
  Administered 2015-07-19 – 2015-07-21 (×3): 2 via NASAL
  Filled 2015-07-19: qty 16

## 2015-07-19 MED ORDER — BUDESONIDE 0.25 MG/2ML IN SUSP
0.2500 mg | Freq: Two times a day (BID) | RESPIRATORY_TRACT | Status: DC
  Administered 2015-07-19 – 2015-07-21 (×4): 0.25 mg via RESPIRATORY_TRACT
  Filled 2015-07-19 (×5): qty 2

## 2015-07-19 MED ORDER — LORAZEPAM 2 MG/ML IJ SOLN
0.5000 mg | Freq: Once | INTRAMUSCULAR | Status: AC
Start: 2015-07-19 — End: 2015-07-19
  Administered 2015-07-19: 0.5 mg via INTRAVENOUS
  Filled 2015-07-19: qty 1

## 2015-07-19 MED ORDER — FUROSEMIDE 10 MG/ML IJ SOLN
40.0000 mg | Freq: Once | INTRAMUSCULAR | Status: AC
  Administered 2015-07-19: 40 mg via INTRAVENOUS
  Filled 2015-07-19: qty 4

## 2015-07-19 MED ORDER — IPRATROPIUM BROMIDE 0.02 % IN SOLN
1.0000 mg | Freq: Once | RESPIRATORY_TRACT | Status: AC
  Administered 2015-07-19: 1 mg via RESPIRATORY_TRACT
  Filled 2015-07-19: qty 5

## 2015-07-19 MED ORDER — SODIUM CHLORIDE 0.9 % IJ SOLN
3.0000 mL | Freq: Two times a day (BID) | INTRAMUSCULAR | Status: DC
  Administered 2015-07-21: 3 mL via INTRAVENOUS

## 2015-07-19 MED ORDER — HYDROXYUREA 500 MG PO CAPS
500.0000 mg | ORAL_CAPSULE | Freq: Every day | ORAL | Status: DC
  Administered 2015-07-20 – 2015-07-21 (×2): 500 mg via ORAL
  Filled 2015-07-19 (×2): qty 1

## 2015-07-19 MED ORDER — OXYCODONE HCL 5 MG PO TABS: 5.0000 mg | ORAL_TABLET | ORAL | Status: DC | PRN

## 2015-07-19 MED ORDER — ACETAMINOPHEN 325 MG PO TABS: 650.0000 mg | ORAL_TABLET | Freq: Four times a day (QID) | ORAL | Status: DC | PRN

## 2015-07-19 MED ORDER — ASPIRIN EC 81 MG PO TBEC
81.0000 mg | DELAYED_RELEASE_TABLET | Freq: Every day | ORAL | Status: DC
  Administered 2015-07-20 – 2015-07-21 (×2): 81 mg via ORAL
  Filled 2015-07-19 (×2): qty 1

## 2015-07-19 MED ORDER — PANTOPRAZOLE SODIUM 40 MG PO TBEC
40.0000 mg | DELAYED_RELEASE_TABLET | Freq: Every day | ORAL | Status: DC
  Administered 2015-07-20 – 2015-07-21 (×2): 40 mg via ORAL
  Filled 2015-07-19 (×2): qty 1

## 2015-07-19 MED ORDER — IPRATROPIUM-ALBUTEROL 0.5-2.5 (3) MG/3ML IN SOLN
3.0000 mL | RESPIRATORY_TRACT | Status: DC
  Administered 2015-07-19: 3 mL via RESPIRATORY_TRACT
  Filled 2015-07-19: qty 3

## 2015-07-19 MED ORDER — SODIUM CHLORIDE 0.9 % IJ SOLN: 3.0000 mL | INTRAMUSCULAR | Status: DC | PRN

## 2015-07-19 NOTE — H&P (Signed)
PATIENT DETAILS Name: Jesus Sanders Age: 79 y.o. Sex: male Date of Birth: Mar 05, 1936 Admit Date: 07/19/2015 [id number] Danne Baxter, MD Referring Physician:Dr Laneta Simmers  CHIEF COMPLAINT:  Shortness of breath.  HPI: Jesus Sanders is a 79 y.o. male with a Past Medical History of COPD on home O2 3-4 L/m, polycythemia vera on Hydrea, CAD, chronic systolic heart failure, who presents today with the above noted complaint. Patient is a poor historian-also is on a BiPAP making history taking difficult. But apparently for the past few days, he has had significant worsening of his baseline shortness of breath. At baseline-patient has to stop once on his way to his mailbox, not very ambulatory because of shortness of breath. Per patient, for the past few days he has had significant worsening of his shortness of breath-he feels that the shortness of breath is very typical of his COPD flare. He says been associated with productive cough with white phlegm. No history of fever. Denies any chest pain. Denies any hemoptysis. There is no history of nausea vomiting or diarrhea. He denies any abdominal pain.  ALLERGIES:  No Known Allergies  PAST MEDICAL HISTORY: Past Medical History  Diagnosis Date  . Polycythemia vera(238.4)     a. Used to receive chronic phlebotomies until 2007, at regional [place].  will restart his phlebotomies from about Mar 15 2011  . Stomach ulcer   . Tobacco abuse     a. cigars  . Duodenal perforation June 2012  . Peritonitis June 2012  . Pneumonia April 2012  . Systolic CHF, chronic     a. 12/2011 Echo: EF 25-30%, mid-dist antsept/inf, apical AK, Gr 1 DD, Triv AI, Mild MR.  . Ischemic cardiomyopathy     a. 01/2012 S/P MDT Protecta single lead ICD, ser # [id number]  . History of DVT (deep vein thrombosis)   . Chronic bronchitis   . GERD (gastroesophageal reflux disease)   . Myocardial infarction   . Shortness of breath   . COPD (chronic obstructive  pulmonary disease)   . Hypertension   . CAD (coronary artery disease)     a. 07/2011 Anterior apical STEMI/Cath/PCI: LM nl, LAD 100p/m (2.5 x 31m Mini-Vision BMS), LCX 40p, RCA dominant, nl, EF 30%.    PAST SURGICAL HISTORY: Past Surgical History  Procedure Laterality Date  . Cholecystectomy  03/2011    Dr. TMarlou Starks . Colon surgery    . Cardiac catheterization  Sept 2012    Normal left main, occluded LAD, 40% LCX and normal RCA. EF is 30%  . UKoreaechocardiography  Sept 2012    EF 25 to 30% with akinesis of the mid to distal anterior and apical myocardium, trivial AI and no apical thrombus  . Cardiac defibrillator placement      single chamber  . Shoulder arthroscopy      left, rotatotor cuff tendinopathy  . Implantable cardioverter defibrillator implant N/A 01/11/2012    Procedure: IMPLANTABLE CARDIOVERTER DEFIBRILLATOR IMPLANT;  Surgeon: SDeboraha Sprang MD;  [facility];  Service: Cardiovascular;  Laterality: N/A;    MEDICATIONS AT HOME: Prior to Admission medications   Medication Sig Start Date End Date Taking? Authorizing Provider  albuterol (PROVENTIL HFA;VENTOLIN HFA) 108 (90 BASE) MCG/ACT inhaler Inhale 2 puffs into the lungs every 6 (six) hours as needed for wheezing or shortness of breath. Patient not taking: Reported on 02/17/2015 06/05/14   MTanda Rockers MD  amLODipine (NORVASC) 10 MG tablet Take  1 tablet (10 mg total) by mouth daily. 02/23/14   Nita Sells, MD  arformoterol (BROVANA) 15 MCG/2ML NEBU Take 2 mLs (15 mcg total) by nebulization 2 (two) times daily. Dx 491.9 06/10/14   Tanda Rockers, MD  aspirin EC 81 MG tablet Take 81 mg by mouth daily.    Historical Provider, MD  budesonide (PULMICORT) 0.25 MG/2ML nebulizer solution Take 2 mLs (0.25 mg total) by nebulization 2 (two) times daily. Dx 491.9 06/10/14   Tanda Rockers, MD  carvedilol (COREG) 25 MG tablet Take 25 mg by mouth 2 (two) times daily with a meal.    Historical Provider, MD    dextromethorphan-guaiFENesin (MUCINEX DM) 30-600 MG per 12 hr tablet Take 1 tablet by mouth 2 (two) times daily as needed for cough.    Historical Provider, MD  furosemide (LASIX) 40 MG tablet Take 40 mg by mouth daily.     Historical Provider, MD  hydroxyurea (HYDREA) 500 MG capsule TAKE ONE CAPSULE BY MOUTH ONCE DAILY WITH FOOD TO  MINIMIZE  GI  SIDE  EFFECTS 03/20/15   Chauncey Cruel, MD  hydroxyurea (HYDREA) 500 MG capsule TAKE ONE CAPSULE BY MOUTH ONCE DAILY WITH FOOD TO  MINIMIZE  GI  SIDE  EFFECTS 06/29/15   Chauncey Cruel, MD  lovastatin (MEVACOR) 20 MG tablet Take 20 mg by mouth at bedtime.    Historical Provider, MD  omeprazole (PRILOSEC) 20 MG capsule Take 20 mg by mouth daily.    Historical Provider, MD  valsartan (DIOVAN) 80 MG tablet TAKE ONE TABLET BY MOUTH ONCE DAILY 05/06/15   Tanda Rockers, MD    FAMILY HISTORY: Family History  Problem Relation Age of Onset  . Lung disease Father     [name]- worked at a [employer]:  reports that he quit smoking about 2 years ago. His smoking use included Cigars. His smokeless tobacco use includes Chew. He reports that he drinks alcohol. He reports that he does not use illicit drugs. Lives at: Home Mobility: Independent  REVIEW OF SYSTEMS:  Constitutional:   No  weight loss, night sweats,  Fevers, chills, fatigue.  HEENT:    No headaches, Dysphagia,Tooth/dental problems,Sore throat,   Cardio-vascular: No chest pain,Orthopnea, PND,lower extremity edema, anasarca, palpitations  GI:  No heartburn, indigestion, abdominal pain, nausea, vomiting, diarrhea, melena or hematochezia  Resp: No hemoptysis,plueritic chest pain.   Skin:  No rash or lesions.  GU:  No dysuria, change in color of urine, no urgency or frequency.  No flank pain.  Musculoskeletal: No joint pain or swelling.  No decreased range of motion.  No back pain.  Endocrine: No heat intolerance, no cold intolerance, no polyuria, no  polydipsia  Psych: No change in mood or affect. No depression or anxiety.  No memory loss.   PHYSICAL EXAM: Blood pressure 97/55, pulse 81, resp. rate 28, weight 106.142 kg (234 lb), SpO2 94 %.  General appearance :Awake, alert, on BiPAP HEENT: Atraumatic and Normocephalic, pupils equally reactive to light and accomodation Neck: supple, no JVD. No cervical lymphadenopathy.  Chest: Decreased air entry at bilateral bases, but is moving air well-has coarse rhonchi. CVS: S1 S2 regular, no murmurs.  Abdomen: Bowel sounds present, Non tender and not distended with no gaurding, rigidity or rebound. Extremities: B/L Lower Ext shows no edema, both legs are warm to touch. Chronic stasis dermatitis changes in his right lower extremity. Neurology:  Non focal Skin:No Rash Wounds:N/A  LABS  ON ADMISSION:   Recent Labs  07/19/15 1522  NA 135  K 5.1  CL 102  CO2 25  GLUCOSE 127*  BUN 25*  CREATININE 1.29*  CALCIUM 8.9    Recent Labs  07/19/15 1522  AST 19  ALT 11*  ALKPHOS 78  BILITOT 0.6  PROT 6.5  ALBUMIN 3.5   No results for input(s): LIPASE, AMYLASE in the last 72 hours.  Recent Labs  07/19/15 1522  WBC 17.5*  NEUTROABS 16.7*  HGB 15.4  HCT 50.1  MCV 92.3  PLT 138*   No results for input(s): CKTOTAL, CKMB, CKMBINDEX, TROPONINI in the last 72 hours. No results for input(s): DDIMER in the last 72 hours. Invalid input(s): POCBNP   RADIOLOGIC STUDIES ON ADMISSION: Dg Chest Portable 1 View  07/19/2015   CLINICAL DATA:  Respiratory distress  EXAM: PORTABLE CHEST - 1 VIEW  COMPARISON:  02/17/2015  FINDINGS: Lung bases are omitted from the field of view. Heart size at least moderately enlarged, with central vascular congestion. Left single lead pacer in place. Prominence of the left hilum compatible with prominent vascular pedicle reidentified. Patchy bibasilar airspace opacities are identified but likely not significantly changed since the prior exam, likely scarring.   IMPRESSION: Suboptimal visualization with portable technique and partial obscuration of the lung bases. Emphysema with superimposed bibasilar scarring, although early pneumonia is not excluded. If symptoms persist, consider PA and lateral chest radiographs obtained at full inspiration when the patient is clinically able.   Electronically Signed   By: Conchita Paris M.D.   On: 07/19/2015 12:23   I have personally reviewed images of chest xray    EKG: Pending  ASSESSMENT AND PLAN: Present on Admission:  . Acute on chronic hypoxic respiratory failure: Secondary to COPD exacerbation, seems to be moving air well-have asked RT to see if we can liberate him off the BiPAP. Admit to stepdown, BiPAP at bedside. Uses 3-4 L of oxygen at baseline. See below.   Marland Kitchen COPD exacerbation: Moving air with rhonchi bilaterally-start IV Solu-Medrol, scheduled bronchodilators and empiric Levaquin. Supportive care with Mucinex, Claritin, and Flonase. Follow clinical course.   . Polycythemia vera: Continue Hydrea-follow-up hemoglobin and hematocrit closely-if any significant worsening-would need therapeutic phlebotomy.   . Chronic systolic heart failure: Suspect acute respiratory failure secondary to COPD rather than CHF. However will give 1 dose of IV Lasix today, and then restart oral Lasix tomorrow. Continue ARB, will discontinue Coreg and use a more selective beta blocker-start metoprolol. If  wheezing gets worse, then may need to switch to bisoprolol. Daily weights, close intake/output did follow.  . Automatic implantable cardioverter-defibrillator- medtronic  . GERD (gastroesophageal reflux disease): Continue PPI.   Marland Kitchen HTN (hypertension): BP controlled-continue with metoprolol, Lasix, ARB and amlodipine.   Marland Kitchen CAD (coronary artery disease): Continue aspirin, statin and beta blocker. Check EKG and cycle cardiac enzymes.   . History of COPD GOLD  IV  Further plan will depend as patient's clinical course evolves and  further radiologic and laboratory data become available. Patient will be monitored closely.  Above noted plan was discussed with patient, he was in agreement.   CONSULTS: None  DVT Prophylaxis: Prophylactic Lovenox   Code Status: Full Code  Disposition Plan:  Discharge back home possibly in 2-3  days,but may warrant SNF depending on clinical course   Total time spent  55 minutes.Greater than 50% of this time was spent in counseling, explanation of diagnosis, planning of further management, and coordination of care.  Oren Binet  [facility] Pager [phone]  If 7PM-7AM, please contact night-coverage [email or web address] Password [facility] 07/19/2015, 5:13 PM

## 2015-07-19 NOTE — Progress Notes (Signed)
Pt refuses NIV, and stated that he does not wear a NIV mask of any sort at home, pt is stable at this time. o2 saturations are acceptable.

## 2015-07-19 NOTE — ED Notes (Signed)
Pt standing at end of bed attempting to urinate,assisted to side of bed, urinated in urinal, assisted to get back in bed. Instructed to stay in bed and call nurse for assistance if needed.

## 2015-07-19 NOTE — ED Notes (Signed)
Report given toi rn on 3s

## 2015-07-19 NOTE — Progress Notes (Signed)
UR COMPLETED  

## 2015-07-19 NOTE — ED Notes (Signed)
To ED via [address], fromhome with pt c/o resp distress. On arrival pt on CPAP -- had received NTG x 1 sl, atrovent and albuterol treatment enroute.

## 2015-07-19 NOTE — ED Notes (Signed)
Pt pulled off BiPap , sitting on end of bed in resp distress, saying "I can't breath! I need some help" pt is anxious, moving around in bed, talking continuously. Reminded not to pull off bipap, reminded to not talk with it on. Dr. Laneta Simmers notified of pt increasing anxiety.

## 2015-07-19 NOTE — ED Provider Notes (Signed)
CSN: [id number]     Arrival date & time 07/19/15  1159 History   First MD Initiated Contact with Patient 07/19/15 1214     Chief Complaint  Patient presents with  . Respiratory Distress     (Consider location/radiation/quality/duration/timing/severity/associated sxs/prior Treatment) Patient is a 79 y.o. male presenting with shortness of breath. The history is provided by the patient.  Shortness of Breath Severity:  Moderate Onset quality:  Gradual Duration:  1 week Timing:  Constant Progression:  Worsening Chronicity:  Recurrent Context: activity   Relieved by:  Nothing Worsened by:  Nothing tried Ineffective treatments:  Inhaler and oxygen Associated symptoms: abdominal pain (and distention with weight gain) and cough   Associated symptoms: no chest pain, no fever, no sputum production, no syncope and no vomiting   Risk factors: no hx of PE/DVT     Past Medical History  Diagnosis Date  . Polycythemia vera(238.4)     a. Used to receive chronic phlebotomies until 2007, at regional [place].  will restart his phlebotomies from about Mar 15 2011  . Stomach ulcer   . Tobacco abuse     a. cigars  . Duodenal perforation June 2012  . Peritonitis June 2012  . Pneumonia April 2012  . Systolic CHF, chronic     a. 12/2011 Echo: EF 25-30%, mid-dist antsept/inf, apical AK, Gr 1 DD, Triv AI, Mild MR.  . Ischemic cardiomyopathy     a. 01/2012 S/P MDT Protecta single lead ICD, ser # [id number]  . History of DVT (deep vein thrombosis)   . Chronic bronchitis   . GERD (gastroesophageal reflux disease)   . Myocardial infarction   . Shortness of breath   . COPD (chronic obstructive pulmonary disease)   . Hypertension   . CAD (coronary artery disease)     a. 07/2011 Anterior apical STEMI/Cath/PCI: LM nl, LAD 100p/m (2.5 x 69m Mini-Vision BMS), LCX 40p, RCA dominant, nl, EF 30%.   Past Surgical History  Procedure Laterality Date  . Cholecystectomy  03/2011    Dr. TMarlou Starks . Colon  surgery    . Cardiac catheterization  Sept 2012    Normal left main, occluded LAD, 40% LCX and normal RCA. EF is 30%  . UKoreaechocardiography  Sept 2012    EF 25 to 30% with akinesis of the mid to distal anterior and apical myocardium, trivial AI and no apical thrombus  . Cardiac defibrillator placement      single chamber  . Shoulder arthroscopy      left, rotatotor cuff tendinopathy  . Implantable cardioverter defibrillator implant N/A 01/11/2012    Procedure: IMPLANTABLE CARDIOVERTER DEFIBRILLATOR IMPLANT;  Surgeon: SDeboraha Sprang MD;  [facility];  Service: Cardiovascular;  Laterality: N/A;   Family History  Problem Relation Age of Onset  . Lung disease Father     [name] worked at a c[employer]ulatoHistory  Substance Use Topics  . Smoking status: Former Smoker -- 0.50 packs/day for 60 years    Types: Cigars    Quit date: 05/18/2013  . Smokeless tobacco: Current User    Types: Chew  . Alcohol Use: Yes     Comment: rarely     Review of Systems  Constitutional: Negative for fever.  Respiratory: Positive for cough and shortness of breath. Negative for sputum production.   Cardiovascular: Negative for chest pain and syncope.  Gastrointestinal: Positive for abdominal pain (and distention with weight gain). Negative for vomiting.  All other systems reviewed and are negative.     Allergies  Review of patient's allergies indicates no known allergies.  Home Medications   Prior to Admission medications   Medication Sig Start Date End Date Taking? Authorizing Provider  albuterol (PROVENTIL HFA;VENTOLIN HFA) 108 (90 BASE) MCG/ACT inhaler Inhale 2 puffs into the lungs every 6 (six) hours as needed for wheezing or shortness of breath. Patient not taking: Reported on 02/17/2015 06/05/14   Tanda Rockers, MD  amLODipine (NORVASC) 10 MG tablet Take 1 tablet (10 mg total) by mouth daily. 02/23/14   Nita Sells, MD  arformoterol (BROVANA) 15 MCG/2ML NEBU Take 2  mLs (15 mcg total) by nebulization 2 (two) times daily. Dx 491.9 06/10/14   Tanda Rockers, MD  aspirin EC 81 MG tablet Take 81 mg by mouth daily.    Historical Provider, MD  budesonide (PULMICORT) 0.25 MG/2ML nebulizer solution Take 2 mLs (0.25 mg total) by nebulization 2 (two) times daily. Dx 491.9 06/10/14   Tanda Rockers, MD  carvedilol (COREG) 25 MG tablet Take 25 mg by mouth 2 (two) times daily with a meal.    Historical Provider, MD  dextromethorphan-guaiFENesin (MUCINEX DM) 30-600 MG per 12 hr tablet Take 1 tablet by mouth 2 (two) times daily as needed for cough.    Historical Provider, MD  furosemide (LASIX) 40 MG tablet Take 40 mg by mouth daily.     Historical Provider, MD  hydroxyurea (HYDREA) 500 MG capsule TAKE ONE CAPSULE BY MOUTH ONCE DAILY WITH FOOD TO  MINIMIZE  GI  SIDE  EFFECTS 03/20/15   Chauncey Cruel, MD  hydroxyurea (HYDREA) 500 MG capsule TAKE ONE CAPSULE BY MOUTH ONCE DAILY WITH FOOD TO  MINIMIZE  GI  SIDE  EFFECTS 06/29/15   Chauncey Cruel, MD  lovastatin (MEVACOR) 20 MG tablet Take 20 mg by mouth at bedtime.    Historical Provider, MD  omeprazole (PRILOSEC) 20 MG capsule Take 20 mg by mouth daily.    Historical Provider, MD  valsartan (DIOVAN) 80 MG tablet TAKE ONE TABLET BY MOUTH ONCE DAILY 05/06/15   Tanda Rockers, MD   BP 123/69 mmHg  Pulse 84  Resp 25  Wt 234 lb (106.142 kg)  SpO2 97% Physical Exam  Constitutional: He is oriented to person, place, and time. He appears well-developed and well-nourished. He appears toxic. He appears distressed. Face mask in place.  HENT:  Head: Normocephalic and atraumatic.  Eyes: Conjunctivae are normal.  Neck: Neck supple. No tracheal deviation present.  Cardiovascular: Normal rate and regular rhythm.   Pulmonary/Chest: Accessory muscle usage present. Tachypnea noted. He is in respiratory distress. He has decreased breath sounds (bilaterally with poor air movement).  Abdominal: Soft. He exhibits no distension.  Neurological:  He is alert and oriented to person, place, and time.  Skin: Skin is warm and dry.  Psychiatric: His mood appears anxious.    ED Course  Procedures (including critical care time) CRITICAL CARE Performed by: Leo Grosser Total critical care time: 30 Critical care time was exclusive of separately billable procedures and treating other patients. Critical care was necessary to treat or prevent imminent or life-threatening deterioration. Critical care was time spent personally by me on the following activities: development of treatment plan with patient and/or surrogate as well as nursing, discussions with consultants, evaluation of patient's response to treatment, examination of patient, obtaining history from patient or surrogate, ordering and performing treatments and interventions, ordering and review of laboratory studies,  ordering and review of radiographic studies, pulse oximetry and re-evaluation of patient's condition.   Labs Review Labs Reviewed  CBC WITH DIFFERENTIAL/PLATELET - Abnormal; Notable for the following:    WBC 17.5 (*)    Platelets 138 (*)    Neutrophils Relative % 96 (*)    Neutro Abs 16.7 (*)    Lymphocytes Relative 3 (*)    Lymphs Abs 0.5 (*)    Monocytes Relative 1 (*)    All other components within normal limits  COMPREHENSIVE METABOLIC PANEL - Abnormal; Notable for the following:    Glucose, Bld 127 (*)    BUN 25 (*)    Creatinine, Ser 1.29 (*)    ALT 11 (*)    GFR calc non Af Amer 52 (*)    All other components within normal limits  CULTURE, BLOOD (ROUTINE X 2)  CULTURE, BLOOD (ROUTINE X 2)  MRSA PCR SCREENING  BRAIN NATRIURETIC PEPTIDE  CBC WITH DIFFERENTIAL/PLATELET  BASIC METABOLIC PANEL  CBC  TROPONIN I  TROPONIN I  TROPONIN I  I-STAT TROPOININ, ED    Imaging Review Dg Chest Portable 1 View  07/19/2015   CLINICAL DATA:  Respiratory distress  EXAM: PORTABLE CHEST - 1 VIEW  COMPARISON:  02/17/2015  FINDINGS: Lung bases are omitted from the  field of view. Heart size at least moderately enlarged, with central vascular congestion. Left single lead pacer in place. Prominence of the left hilum compatible with prominent vascular pedicle reidentified. Patchy bibasilar airspace opacities are identified but likely not significantly changed since the prior exam, likely scarring.  IMPRESSION: Suboptimal visualization with portable technique and partial obscuration of the lung bases. Emphysema with superimposed bibasilar scarring, although early pneumonia is not excluded. If symptoms persist, consider PA and lateral chest radiographs obtained at full inspiration when the patient is clinically able.   Electronically Signed   By: Conchita Paris M.D.   On: 07/19/2015 12:23   I have personally reviewed and evaluated these images and lab results as part of my medical decision-making.   EKG Interpretation   Date/Time:  Sunday July 19 2015 18:24:11 EDT Ventricular Rate:  92 PR Interval:  187 QRS Duration: 98 QT Interval:  356 QTC Calculation: 440 R Axis:   -87 Text Interpretation:  Sinus rhythm Left anterior fascicular block Probable  anterolateral infarct, age indeterm Abnormal T, consider ischemia, lateral  leads Baseline wander in lead(s) V3 Confirmed by Johnavon Mcclafferty MD, Railee Bonillas ([id number])  on 07/19/2015 7:58:22 PM      MDM   Final diagnoses:  Acute respiratory failure with hypoxia  COPD exacerbation    79 year old male presents with increased shortness of breath from home, has a history of CHF and severe COPD at baseline on home oxygen. Presents in moderate respiratory distress with retractions and poor air movement. Has been moderately noncompliant with medications, has noticed increased swelling, no chest pain. Suspect CHF exacerbation versus COPD. BNP not dramatically elevated, patient has no pulmonary edema, he is given steroids, breathing treatments and placed on BiPAP for increased work of breathing and hypoxemia. Covered for CAP and  atypicals with levaquin.  Hospitalist was consulted for admission and will see the patient in the emergency department.   Leo Grosser, MD 07/19/15 2002

## 2015-07-19 NOTE — Consult Note (Signed)
ANTIBIOTIC CONSULT NOTE - INITIAL  Pharmacy Consult for Levaquin Indication: COPD/bronchitis  No Known Allergies  Patient Measurements: Weight: 234 lb (106.142 kg)  Vital Signs: Temp: 98.3 F (36.8 C) (09/11 1850) Temp Source: Oral (09/11 1850) BP: 119/70 mmHg (09/11 1850) Pulse Rate: 92 (09/11 1850) Intake/Output from previous day:   Intake/Output from this shift:    Labs:  Recent Labs  07/19/15 1522  WBC 17.5*  HGB 15.4  PLT 138*  CREATININE 1.29*   Estimated Creatinine Clearance: 60.4 mL/min (by C-G formula based on Cr of 1.29).  Microbiology: No results found for this or any previous visit (from the past 720 hour(s)).  Medical History: Past Medical History  Diagnosis Date  . Polycythemia vera(238.4)     a. Used to receive chronic phlebotomies until 2007, at regional [place].  will restart his phlebotomies from about Mar 15 2011  . Stomach ulcer   . Tobacco abuse     a. cigars  . Duodenal perforation June 2012  . Peritonitis June 2012  . Pneumonia April 2012  . Systolic CHF, chronic     a. 12/2011 Echo: EF 25-30%, mid-dist antsept/inf, apical AK, Gr 1 DD, Triv AI, Mild MR.  . Ischemic cardiomyopathy     a. 01/2012 S/P MDT Protecta single lead ICD, ser # [id number] H  . History of DVT (deep vein thrombosis)   . Chronic bronchitis   . GERD (gastroesophageal reflux disease)   . Myocardial infarction   . Shortness of breath   . COPD (chronic obstructive pulmonary disease)   . Hypertension   . CAD (coronary artery disease)     a. 07/2011 Anterior apical STEMI/Cath/PCI: LM nl, LAD 100p/m (2.5 x 98m Mini-Vision BMS), LCX 40p, RCA dominant, nl, EF 30%.   Assessment: 79yom w/ hx COPD presents to the ED with SOB. CXR cannot exclude pneumonia. He will begin levaquin. Renal function stable.  Goal of Therapy:  Appropriate levaquin dosing  Plan:  1) Levaquin '750mg'$  IV q24 2) Follow renal function, LOT  MDeboraha Sprang9/09/2015,6:54 PM

## 2015-07-19 NOTE — ED Notes (Signed)
Safety sitter art the beside.  The pt gets out of bed if no one is with him

## 2015-07-19 NOTE — Progress Notes (Signed)
Pt arrived to [place], pt on 3L [place] with 02 in low 90's, other vss stable. A&0x4. Noted pt has several notable skin lesions pt states, "they are from psoriasis". One lesion is located on l. Lower back, and other is on his left knee. Will continue to monitor.

## 2015-07-19 NOTE — ED Notes (Signed)
Admitting doctor at the bedside.  Bi-pap off per resop by admitting doctors order

## 2015-07-20 ENCOUNTER — Inpatient Hospital Stay (HOSPITAL_COMMUNITY): Payer: Medicare Other

## 2015-07-20 DIAGNOSIS — M7989 Other specified soft tissue disorders: Secondary | ICD-10-CM

## 2015-07-20 LAB — BASIC METABOLIC PANEL
Anion gap: 8 (ref 5–15)
BUN: 28 mg/dL — ABNORMAL HIGH (ref 6–20)
CHLORIDE: 102 mmol/L (ref 101–111)
CO2: 26 mmol/L (ref 22–32)
CREATININE: 1.43 mg/dL — AB (ref 0.61–1.24)
Calcium: 9.2 mg/dL (ref 8.9–10.3)
GFR calc non Af Amer: 46 mL/min — ABNORMAL LOW (ref >60)
GFR, EST AFRICAN AMERICAN: 53 mL/min — AB (ref >60)
Glucose, Bld: 129 mg/dL — ABNORMAL HIGH (ref 65–99)
Potassium: 4.6 mmol/L (ref 3.5–5.1)
Sodium: 136 mmol/L (ref 135–145)

## 2015-07-20 LAB — CBC
HEMATOCRIT: 49.9 % (ref 39.0–52.0)
HEMOGLOBIN: 15.5 g/dL (ref 13.0–17.0)
MCH: 28.1 pg (ref 26.0–34.0)
MCHC: 31.1 g/dL (ref 30.0–36.0)
MCV: 90.6 fL (ref 78.0–100.0)
PLATELETS: 136 10*3/uL — AB (ref 150–400)
RBC: 5.51 MIL/uL (ref 4.22–5.81)
RDW: 14.6 % (ref 11.5–15.5)
WBC: 14.8 10*3/uL — ABNORMAL HIGH (ref 4.0–10.5)

## 2015-07-20 LAB — TROPONIN I: Troponin I: <0.03 ng/mL (ref <0.031)

## 2015-07-20 LAB — MRSA PCR SCREENING: MRSA by PCR: POSITIVE — AB

## 2015-07-20 MED ORDER — METHYLPREDNISOLONE SODIUM SUCC 40 MG IJ SOLR
40.0000 mg | Freq: Three times a day (TID) | INTRAMUSCULAR | Status: DC
  Administered 2015-07-20 – 2015-07-21 (×3): 40 mg via INTRAVENOUS
  Filled 2015-07-20 (×5): qty 1

## 2015-07-20 MED ORDER — MUPIROCIN 2 % EX OINT
1.0000 "application " | TOPICAL_OINTMENT | Freq: Two times a day (BID) | CUTANEOUS | Status: DC
  Administered 2015-07-21: 1 via NASAL
  Filled 2015-07-20: qty 22

## 2015-07-20 MED ORDER — IPRATROPIUM-ALBUTEROL 0.5-2.5 (3) MG/3ML IN SOLN
3.0000 mL | Freq: Two times a day (BID) | RESPIRATORY_TRACT | Status: DC
  Administered 2015-07-21: 3 mL via RESPIRATORY_TRACT
  Filled 2015-07-20: qty 3

## 2015-07-20 MED ORDER — MENTHOL 3 MG MT LOZG
1.0000 | LOZENGE | OROMUCOSAL | Status: DC | PRN
  Administered 2015-07-20: 3 mg via ORAL
  Filled 2015-07-20: qty 9

## 2015-07-20 MED ORDER — CHLORHEXIDINE GLUCONATE CLOTH 2 % EX PADS
6.0000 | MEDICATED_PAD | Freq: Every day | CUTANEOUS | Status: DC
  Administered 2015-07-20 – 2015-07-21 (×2): 6 via TOPICAL

## 2015-07-20 NOTE — Evaluation (Signed)
Physical Therapy Evaluation Patient Details Name: Jesus Sanders MRN: [id number] DOB: 12/28/1935 Today's Date: 07/20/2015   History of Present Illness  pt is a 79 y/o male admitted With increased SOB thought to be a COPD exacerbation.  Clinical Impression  Pt admitted with/for SOB.  Pt currently limited functionally due to the problems listed below.  (see problems list.)  Pt will benefit from PT to maximize function and safety to be able to get home safely with available assist of family .     Follow Up Recommendations No PT follow up    Equipment Recommendations  None recommended by PT    Recommendations for Other Services       Precautions / Restrictions        Mobility  Bed Mobility Overal bed mobility: Modified Independent                Transfers Overall transfer level: Modified independent                  Ambulation/Gait Ambulation/Gait assistance: Supervision Ambulation Distance (Feet): 330 Feet Assistive device: None Gait Pattern/deviations: Step-through pattern Gait velocity: moderate speed Gait velocity interpretation: at or above normal speed for age/gender General Gait Details: generally steady gait with ability to maintain 92% on 4L with moderate dyspnea.  Stairs            Wheelchair Mobility    Modified Rankin (Stroke Patients Only)       Balance Overall balance assessment: Needs assistance Sitting-balance support: No upper extremity supported Sitting balance-Leahy Scale: Good     Standing balance support: No upper extremity supported Standing balance-Leahy Scale: Good                               Pertinent Vitals/Pain Pain Assessment: No/denies pain    Home Living Family/patient expects to be discharged to:: Private residence Living Arrangements: Alone Available Help at Discharge: Available PRN/intermittently (family lives next door) Type of Home: House Home Access: Stairs to enter Entrance  Stairs-Rails: Chemical engineer of Steps: few Home Layout: One level Home Equipment: Grab bars - toilet;Grab bars - tub/shower;Cane - single point      Prior Function Level of Independence: Independent               Hand Dominance        Extremity/Trunk Assessment   Upper Extremity Assessment: Defer to OT evaluation           Lower Extremity Assessment: Overall WFL for tasks assessed         Communication   Communication: No difficulties  Cognition Arousal/Alertness: Awake/alert Behavior During Therapy: WFL for tasks assessed/performed Overall Cognitive Status: Within Functional Limits for tasks assessed                      General Comments General comments (skin integrity, edema, etc.): SpO2 92+% on 3-4 L [place]: End of session on 3L sats 96%  HR in 90's    Exercises        Assessment/Plan    PT Assessment Patient needs continued PT services  PT Diagnosis Other (comment) (decreased activity tolerance)   PT Problem List Decreased activity tolerance;Decreased mobility;Cardiopulmonary status limiting activity  PT Treatment Interventions Gait training;Stair training;Functional mobility training;Therapeutic activities;Patient/family education   PT Goals (Current goals can be found in the Care Plan section) Acute Rehab PT Goals PT Goal Formulation: With patient Time For  Goal Achievement: 07/27/15 Potential to Achieve Goals: Good    Frequency Min 3X/week   Barriers to discharge        Co-evaluation               End of Session   Activity Tolerance: Patient tolerated treatment well Patient left: Other (comment);with call bell/phone within reach (EOB) Nurse Communication: Mobility status         Time: 1410-1430 PT Time Calculation (min) (ACUTE ONLY): 20 min   Charges:     PT Treatments $Gait Training: 8-22 mins   PT G Codes:        Jesus Sanders, Jesus Sanders 07/20/2015, 5:05 PM 07/20/2015  Jesus Sanders,  [place] [phone]  (pager)

## 2015-07-20 NOTE — Progress Notes (Signed)
PATIENT DETAILS Name: Jesus Sanders Age: 79 y.o. Sex: male Date of Birth: Apr 29, 1936 Admit Date: 07/19/2015 Admitting Physician Evalee Mutton Kristeen Mans, MD [facility]:[id number],[id number] Danne Baxter, MD  Subjective: Much improved-no longer requiring BiPAP. Claims close to her usual baseline.  Assessment/Plan: Acute on chronic hypoxic respiratory failure: Secondary to COPD exacerbation, much improved. Required BiPAP on admission in the emergency room, now back on 3-4 L of oxygen via nasal cannula. Transfer to [place].   COPD exacerbation: Significantly improved, hardly any rhonchi today. Taper Solu-Medrol, continue with nebs and empiric Levaquin. Follow clinical course. Suspect that if clinical improvement continues, home on 9/13.  Polycythemia vera: Continue Hydrea-follow-up hemoglobin and hematocrit closely-if any significant worsening-will need therapeutic phlebotomy.   Chronic systolic heart failure: Suspect acute respiratory failure secondary to COPD rather than CHF. CHF remains compensated, continue oral Lasix, metoprolol and ARB. Weight remains stable at 226 pounds.   Automatic implantable cardioverter-defibrillator- medtronic  GERD (gastroesophageal reflux disease): Continue PPI.   HTN (hypertension): BP controlled-continue with metoprolol, Lasix, ARB and amlodipine.   CAD (coronary artery disease): Continue aspirin, statin and beta blocker. Troponins negative.   History of COPD GOLD IV  Disposition: Remain inpatient-transfer to tele  Antimicrobial agents  See below  Anti-infectives    Start     Dose/Rate Route Frequency Ordered Stop   07/19/15 1900  levofloxacin (LEVAQUIN) IVPB 750 mg     750 mg 100 mL/hr over 90 Minutes Intravenous Every 24 hours 07/19/15 1854     07/19/15 1300  levofloxacin (LEVAQUIN) IVPB 750 mg     750 mg 100 mL/hr over 90 Minutes Intravenous  Once 07/19/15 1255 07/19/15 1805      DVT Prophylaxis: Prophylactic Lovenox   Code Status: Full  code  Family Communication None at bedside  Procedures: None  CONSULTS:  None  Time spent 30 minutes-Greater than 50% of this time was spent in counseling, explanation of diagnosis, planning of further management, and coordination of care.  MEDICATIONS: Scheduled Meds: . amLODipine  10 mg Oral Daily  . antiseptic oral rinse  7 mL Mouth Rinse BID  . aspirin EC  81 mg Oral Daily  . budesonide  0.25 mg Nebulization BID  . Chlorhexidine Gluconate Cloth  6 each Topical Q0600  . enoxaparin (LOVENOX) injection  40 mg Subcutaneous Q24H  . fluticasone  2 spray Each Nare Daily  . furosemide  40 mg Oral Daily  . guaiFENesin  1,200 mg Oral BID  . hydroxyurea  500 mg Oral Daily  . ipratropium-albuterol  3 mL Nebulization Q6H  . irbesartan  75 mg Oral Daily  . levofloxacin (LEVAQUIN) IV  750 mg Intravenous Q24H  . loratadine  10 mg Oral Daily  . methylPREDNISolone (SOLU-MEDROL) injection  60 mg Intravenous 3 times per day  . metoprolol tartrate  25 mg Oral BID  . [START ON 07/21/2015] mupirocin ointment  1 application Nasal BID  . pantoprazole  40 mg Oral Daily  . polyethylene glycol  17 g Oral Daily  . pravastatin  20 mg Oral q1800  . sodium chloride  3 mL Intravenous Q12H  . sodium chloride  3 mL Intravenous Q12H   Continuous Infusions:  PRN Meds:.sodium chloride, acetaminophen **OR** acetaminophen, albuterol, guaiFENesin-dextromethorphan, menthol-cetylpyridinium, ondansetron **OR** ondansetron (ZOFRAN) IV, oxyCODONE, sodium chloride    PHYSICAL EXAM: Vital signs in last 24 hours: Filed Vitals:   07/20/15 0500 07/20/15 0715 07/20/15 0807 07/20/15 0814  BP:  132/72   Pulse:  97 90   Temp:  97.7 F (36.5 C)    TempSrc:  Oral    Resp:  26 26   Height:      Weight: 102.6 kg (226 lb 3.1 oz)     SpO2:  93% 90% 93%    Weight change:  Filed Weights   07/19/15 1209 07/19/15 2000 07/20/15 0500  Weight: 106.142 kg (234 lb) 102.9 kg (226 lb 13.7 oz) 102.6 kg (226 lb 3.1 oz)    Body mass index is 30.67 kg/(m^2).   Gen Exam: Awake and alert with clear speech.   Neck: Supple, No JVD.   Chest: B/L Clear.   CVS: S1 S2 Regular, no murmurs.  Abdomen: soft, BS +, non tender, non distended.  Extremities: no edema, lower extremities warm to touch. Neurologic: Non Focal.  Skin: No Rash.   Wounds: N/A.    Intake/Output from previous day:  Intake/Output Summary (Last 24 hours) at 07/20/15 1143 Last data filed at 07/20/15 1100  Gross per 24 hour  Intake    295 ml  Output   1400 ml  Net  -1105 ml     LAB RESULTS: CBC  Recent Labs Lab 07/19/15 1522 07/20/15 0612  WBC 17.5* 14.8*  HGB 15.4 15.5  HCT 50.1 49.9  PLT 138* 136*  MCV 92.3 90.6  MCH 28.4 28.1  MCHC 30.7 31.1  RDW 14.8 14.6  LYMPHSABS 0.5*  --   MONOABS 0.2  --   EOSABS 0.0  --   BASOSABS 0.0  --     Chemistries   Recent Labs Lab 07/19/15 1522 07/20/15 0612  NA 135 136  K 5.1 4.6  CL 102 102  CO2 25 26  GLUCOSE 127* 129*  BUN 25* 28*  CREATININE 1.29* 1.43*  CALCIUM 8.9 9.2    CBG: No results for input(s): GLUCAP in the last 168 hours.  GFR Estimated Creatinine Clearance: 53.6 mL/min (by C-G formula based on Cr of 1.43).  Coagulation profile No results for input(s): INR, PROTIME in the last 168 hours.  Cardiac Enzymes  Recent Labs Lab 07/19/15 1905 07/20/15 0035 07/20/15 0612  TROPONINI <0.03 <0.03 <0.03    Invalid input(s): POCBNP No results for input(s): DDIMER in the last 72 hours. No results for input(s): HGBA1C in the last 72 hours. No results for input(s): CHOL, HDL, LDLCALC, TRIG, CHOLHDL, LDLDIRECT in the last 72 hours. No results for input(s): TSH, T4TOTAL, T3FREE, THYROIDAB in the last 72 hours.  Invalid input(s): FREET3 No results for input(s): VITAMINB12, FOLATE, FERRITIN, TIBC, IRON, RETICCTPCT in the last 72 hours. No results for input(s): LIPASE, AMYLASE in the last 72 hours.  Urine Studies No results for input(s): UHGB, CRYS in the last  72 hours.  Invalid input(s): UACOL, UAPR, USPG, UPH, UTP, UGL, UKET, UBIL, UNIT, UROB, ULEU, UEPI, UWBC, URBC, UBAC, CAST, UCOM, BILUA  MICROBIOLOGY: Recent Results (from the past 240 hour(s))  MRSA PCR Screening     Status: Abnormal   Collection Time: 07/19/15 10:33 PM  Result Value Ref Range Status   MRSA by PCR POSITIVE (A) NEGATIVE Final    Comment:        The GeneXpert MRSA Assay (FDA approved for NASAL specimens only), is one component of a comprehensive MRSA colonization surveillance program. It is not intended to diagnose MRSA infection nor to guide or monitor treatment for MRSA infections. RESULT CALLED TO, READ BACK BY AND VERIFIED WITH: IRBY,T RN [id number] 9/11/6 MITCHELL,L 07/19/15  RADIOLOGY STUDIES/RESULTS: Dg Chest Portable 1 View  07/19/2015   CLINICAL DATA:  Respiratory distress  EXAM: PORTABLE CHEST - 1 VIEW  COMPARISON:  02/17/2015  FINDINGS: Lung bases are omitted from the field of view. Heart size at least moderately enlarged, with central vascular congestion. Left single lead pacer in place. Prominence of the left hilum compatible with prominent vascular pedicle reidentified. Patchy bibasilar airspace opacities are identified but likely not significantly changed since the prior exam, likely scarring.  IMPRESSION: Suboptimal visualization with portable technique and partial obscuration of the lung bases. Emphysema with superimposed bibasilar scarring, although early pneumonia is not excluded. If symptoms persist, consider PA and lateral chest radiographs obtained at full inspiration when the patient is clinically able.   Electronically Signed   By: Conchita Paris M.D.   On: 07/19/2015 12:23    Oren Binet, MD  [facility] Pager:[phone]  If 7PM-7AM, please contact night-coverage [email or web address] Password [facility] 07/20/2015, 11:43 AM   LOS: 1 day

## 2015-07-20 NOTE — Care Management Note (Addendum)
Case Management Note  Patient Details  Name: Jesus Sanders MRN: [id number] Date of Birth: Mar 07, 1936  Subjective/Objective:                 Pt from home  lives alone. Independent with ADLS. Admitted with SOB, history of COPD on home O2 3-4 L/m, polycythemia vera on Hydrea, CAD, chronic systolic heart failure.   Action/Plan: Return to home when medically stable. CM to f/u with d/c needs.  Expected Discharge Date:                  Expected Discharge Plan:  Home/Self Care  In-House Referral:     Discharge planning Services  CM Consult  Post Acute Care Choice:    Choice offered to:     DME Arranged:  Oxygen DME Agency:  [place]:    [facility] Agency:     Status of Service:  In process, will continue to follow  Medicare Important Message Given:    Date Medicare IM Given:    Medicare IM give by:    Date Additional Medicare IM Given:    Additional Medicare Important Message give by:     If discussed at [place] of Stay Meetings, dates discussed:    Additional Comments:   Pt states daughter Suanne Marker) who lives next door to him will take care of him @ d/c. Lendon Ka (Daughter) [phone] ,Welford Roche (Daughter)  [phone]  Sharin Mons, [place], BSN, [place] 07/20/2015, 11:14 AM

## 2015-07-20 NOTE — Progress Notes (Signed)
VASCULAR LAB PRELIMINARY  PRELIMINARY  PRELIMINARY  PRELIMINARY  Bilateral Lower Venous Duplex Completed. Negative for deep and superficial vein thrombosis. Incidental finding - A right popliteal aneurysm was noted measuring approximal 6.1 in length, 3.4 x 4.2 cm. Results given to patient's nurse.  Alla German, RVT 07/20/2015, 11:59 AM

## 2015-07-20 NOTE — Progress Notes (Signed)
Report called pt transferring to 5w05 via w/c with belongings.

## 2015-07-21 ENCOUNTER — Inpatient Hospital Stay (HOSPITAL_COMMUNITY): Payer: Medicare Other

## 2015-07-21 ENCOUNTER — Telehealth: Payer: Self-pay | Admitting: Surgery

## 2015-07-21 ENCOUNTER — Encounter (HOSPITAL_COMMUNITY): Payer: Self-pay | Admitting: Radiology

## 2015-07-21 DIAGNOSIS — J9600 Acute respiratory failure, unspecified whether with hypoxia or hypercapnia: Secondary | ICD-10-CM

## 2015-07-21 DIAGNOSIS — I724 Aneurysm of artery of lower extremity: Secondary | ICD-10-CM

## 2015-07-21 MED ORDER — IOHEXOL 350 MG/ML SOLN
100.0000 mL | Freq: Once | INTRAVENOUS | Status: AC | PRN
  Administered 2015-07-21: 100 mL via INTRAVENOUS

## 2015-07-21 MED ORDER — PREDNISONE 10 MG PO TABS: ORAL_TABLET | ORAL | Status: DC

## 2015-07-21 MED ORDER — APIXABAN 5 MG PO TABS
10.0000 mg | ORAL_TABLET | Freq: Two times a day (BID) | ORAL | Status: DC
  Administered 2015-07-21: 10 mg via ORAL
  Filled 2015-07-21: qty 2

## 2015-07-21 MED ORDER — APIXABAN 5 MG PO TABS: 5.0000 mg | ORAL_TABLET | Freq: Two times a day (BID) | ORAL | Status: DC

## 2015-07-21 MED ORDER — APIXABAN 5 MG PO TABS: 10.0000 mg | ORAL_TABLET | Freq: Two times a day (BID) | ORAL | Status: DC

## 2015-07-21 MED ORDER — METOPROLOL TARTRATE 25 MG PO TABS: 25.0000 mg | ORAL_TABLET | Freq: Two times a day (BID) | ORAL | Status: DC

## 2015-07-21 MED ORDER — LEVOFLOXACIN 750 MG PO TABS: 750.0000 mg | ORAL_TABLET | Freq: Every day | ORAL | Status: DC

## 2015-07-21 NOTE — Progress Notes (Signed)
Eliquis 30 day free card given to patient. Questions answered. Patient grateful.

## 2015-07-21 NOTE — Consult Note (Signed)
[facility] NOTE   MRN : [id number]  Reason for Consult: Popliteal aneurysm    History of Present Illness: 79 y/o male admitted secondary to SOB.  Venous doppler revealed  right popliteal aneurysm was noted measuring approximal 6.1 in length, 3.4 x 4.2 cm.  Negative for deep and superficial vein thrombosis.  He denise leg/ calf pain with ambulation he has had one incident of left knee pain that lasted a few second in the past, otherwise asymptomatic left leg popliteal aneurysm. Past medical history includes: COPD on home O2 3-4 L/m, polycythemia vera on Hydrea, CAD, chronic systolic heart failure.  Hypertension managed with Norvasc and Carvedilol, hypercholesterolemia managed with Lovastatin, as well as a prevent 81 mg Asprin daily.          Current Facility-Administered Medications  Medication Dose Route Frequency Provider Last Rate Last Dose  . 0.9 %  sodium chloride infusion  250 mL Intravenous PRN Jonetta Osgood, MD   Stopped at 07/20/15 1516  . acetaminophen (TYLENOL) tablet 650 mg  650 mg Oral Q6H PRN Shanker Kristeen Mans, MD       Or  . acetaminophen (TYLENOL) suppository 650 mg  650 mg Rectal Q6H PRN Shanker Kristeen Mans, MD      . albuterol (PROVENTIL) (2.5 MG/3ML) 0.083% nebulizer solution 2.5 mg  2.5 mg Nebulization Q2H PRN Jonetta Osgood, MD      . amLODipine (NORVASC) tablet 10 mg  10 mg Oral Daily Jonetta Osgood, MD   10 mg at 07/20/15 1044  . antiseptic oral rinse (CPC / CETYLPYRIDINIUM CHLORIDE 0.05%) solution 7 mL  7 mL Mouth Rinse BID Jonetta Osgood, MD   7 mL at 07/20/15 2311  . aspirin EC tablet 81 mg  81 mg Oral Daily Jonetta Osgood, MD   81 mg at 07/20/15 1044  . budesonide (PULMICORT) nebulizer solution 0.25 mg  0.25 mg Nebulization BID Jonetta Osgood, MD   0.25 mg at 07/21/15 0900  . Chlorhexidine Gluconate Cloth 2 % PADS 6 each  6 each Topical Q0600 Jonetta Osgood, MD   6 each at 07/21/15 [phone]  . enoxaparin  (LOVENOX) injection 40 mg  40 mg Subcutaneous Q24H Jonetta Osgood, MD   40 mg at 07/20/15 2142  . fluticasone (FLONASE) 50 MCG/ACT nasal spray 2 spray  2 spray Each Nare Daily Jonetta Osgood, MD   2 spray at 07/20/15 1045  . furosemide (LASIX) tablet 40 mg  40 mg Oral Daily Jonetta Osgood, MD   40 mg at 07/20/15 1044  . guaiFENesin (MUCINEX) 12 hr tablet 1,200 mg  1,200 mg Oral BID Jonetta Osgood, MD   1,200 mg at 07/20/15 2312  . guaiFENesin-dextromethorphan (ROBITUSSIN DM) 100-10 MG/5ML syrup 5 mL  5 mL Oral Q4H PRN Shanker Kristeen Mans, MD      . hydroxyurea (HYDREA) capsule 500 mg  500 mg Oral Daily Jonetta Osgood, MD   500 mg at 07/20/15 1044  . ipratropium-albuterol (DUONEB) 0.5-2.5 (3) MG/3ML nebulizer solution 3 mL  3 mL Nebulization BID Jonetta Osgood, MD   3 mL at 07/21/15 0900  . irbesartan (AVAPRO) tablet 75 mg  75 mg Oral Daily Jonetta Osgood, MD   75 mg at 07/20/15 1044  . levofloxacin (LEVAQUIN) IVPB 750 mg  750 mg Intravenous Q24H Otilio Miu, RPH   750 mg at 07/20/15 1735  . loratadine (CLARITIN) tablet 10 mg  10 mg  Oral Daily Jonetta Osgood, MD   10 mg at 07/20/15 1044  . menthol-cetylpyridinium (CEPACOL) lozenge 3 mg  1 lozenge Oral PRN Jonetta Osgood, MD   3 mg at 07/20/15 2778  . methylPREDNISolone sodium succinate (SOLU-MEDROL) 40 mg/mL injection 40 mg  40 mg Intravenous 3 times per day Jonetta Osgood, MD   40 mg at 07/21/15 0617  . metoprolol tartrate (LOPRESSOR) tablet 25 mg  25 mg Oral BID Jonetta Osgood, MD   25 mg at 07/20/15 2312  . mupirocin ointment (BACTROBAN) 2 % 1 application  1 application Nasal BID Shanker Kristeen Mans, MD      . ondansetron [facility]) tablet 4 mg  4 mg Oral Q6H PRN Shanker Kristeen Mans, MD       Or  . ondansetron (ZOFRAN) injection 4 mg  4 mg Intravenous Q6H PRN Shanker Kristeen Mans, MD      . oxyCODONE (Oxy IR/ROXICODONE) immediate release tablet 5 mg  5 mg Oral Q4H PRN Shanker Kristeen Mans, MD      . pantoprazole  (PROTONIX) EC tablet 40 mg  40 mg Oral Daily Jonetta Osgood, MD   40 mg at 07/20/15 1044  . polyethylene glycol (MIRALAX / GLYCOLAX) packet 17 g  17 g Oral Daily Jonetta Osgood, MD   17 g at 07/20/15 1044  . pravastatin (PRAVACHOL) tablet 20 mg  20 mg Oral q1800 Jonetta Osgood, MD   20 mg at 07/20/15 1735  . sodium chloride 0.9 % injection 3 mL  3 mL Intravenous Q12H Jonetta Osgood, MD   0 mL at 07/19/15 2027  . sodium chloride 0.9 % injection 3 mL  3 mL Intravenous Q12H Jonetta Osgood, MD   10 mL at 07/20/15 2318  . sodium chloride 0.9 % injection 3 mL  3 mL Intravenous PRN Shanker Kristeen Mans, MD        Pt meds include: Statin :Yes Betablocker: Yes ASA: Yes Other anticoagulants/antiplatelets: none  Past Medical History  Diagnosis Date  . Polycythemia vera(238.4)     a. Used to receive chronic phlebotomies until 2007, at [facility] [place].  will restart his phlebotomies from about Mar 15 2011  . Stomach ulcer   . Tobacco abuse     a. cigars  . Duodenal perforation June 2012  . Peritonitis June 2012  . Pneumonia April 2012  . Systolic CHF, chronic     a. 12/2011 Echo: EF 25-30%, mid-dist antsept/inf, apical AK, Gr 1 DD, Triv AI, Mild MR.  . Ischemic cardiomyopathy     a. 01/2012 S/P MDT Protecta single lead ICD, ser # [id number]  . History of DVT (deep vein thrombosis)   . Chronic bronchitis   . GERD (gastroesophageal reflux disease)   . Myocardial infarction   . Shortness of breath   . COPD (chronic obstructive pulmonary disease)   . Hypertension   . CAD (coronary artery disease)     a. 07/2011 Anterior apical STEMI/Cath/PCI: LM nl, LAD 100p/m (2.5 x 62m Mini-Vision BMS), LCX 40p, RCA dominant, nl, EF 30%.    Past Surgical History  Procedure Laterality Date  . Cholecystectomy  03/2011    Dr. TMarlou Starks . Colon surgery    . Cardiac catheterization  Sept 2012    Normal left main, occluded LAD, 40% LCX and normal RCA. EF is 30%  . UKoreaechocardiography  Sept 2012     EF 25 to 30% with akinesis of the mid to distal  anterior and apical myocardium, trivial AI and no apical thrombus  . Cardiac defibrillator placement      single chamber  . Shoulder arthroscopy      left, rotatotor cuff tendinopathy  . Implantable cardioverter defibrillator implant N/A 01/11/2012    Procedure: IMPLANTABLE CARDIOVERTER DEFIBRILLATOR IMPLANT;  Surgeon: Deboraha Sprang, MD;  Location: [facility] C[facility] LAB;  Service: Cardiovascular;  Laterality: N/A;    Social History Social History  Substance Use Topics  . Smoking status: Former Smoker -- 0.50 packs/day for 60 years    Types: Cigars    Quit date: 05/18/2013  . Smokeless tobacco: Current User    Types: Chew  . Alcohol Use: Yes     Comment: rarely     Family History Family History  Problem Relation Age of Onset  . Lung disease Father     [employer] [employer] [facility]- worked at a [employer]     No Known Allergies   REVIEW OF SYSTEMS  General: '[ ]'$  Weight loss, '[ ]'$  Fever, '[ ]'$  chills Neurologic: '[ ]'$  Dizziness, '[ ]'$  Blackouts, '[ ]'$  Seizure '[ ]'$  Stroke, '[ ]'$  "Mini stroke", '[ ]'$  Slurred speech, '[ ]'$  Temporary blindness; '[ ]'$  weakness in arms or legs, '[ ]'$  Hoarseness '[ ]'$  Dysphagia Cardiac: '[ ]'$  Chest pain/pressure, [x ] Shortness of breath at rest [x ] Shortness of breath with exertion, '[ ]'$  Atrial fibrillation or irregular heartbeat  Vascular: '[ ]'$  Pain in legs with walking, '[ ]'$  Pain in legs at rest, '[ ]'$  Pain in legs at night,  '[ ]'$  Non-healing ulcer, '[ ]'$  Blood clot in vein/DVT,   Pulmonary: '[ ]'$  Home oxygen, '[ ]'$  Productive cough, '[ ]'$  Coughing up blood, '[ ]'$  Asthma,  '[ ]'$  Wheezing '[x]'$  COPD Musculoskeletal:  '[ ]'$  Arthritis, '[ ]'$  Low back pain, '[ ]'$  Joint pain Hematologic: '[ ]'$  Easy Bruising, '[ ]'$  Anemia; '[ ]'$  Hepatitis Gastrointestinal: '[ ]'$  Blood in stool, '[ ]'$  Gastroesophageal Reflux/heartburn, Urinary: '[ ]'$  chronic Kidney disease, '[ ]'$  on HD - '[ ]'$  MWF or '[ ]'$  TTHS, '[ ]'$  Burning with urination, '[ ]'$  Difficulty urinating Skin: '[ ]'$  Rashes, '[ ]'$  Wounds Psychological: '[ ]'$   Anxiety, '[ ]'$  Depression  Physical Examination Filed Vitals:   07/20/15 2316 07/21/15 0531 07/21/15 0534 07/21/15 0900  BP: 115/70  117/63   Pulse: 78  68   Temp:   97.7 F (36.5 C)   TempSrc:   Oral   Resp: 22  20   Height:      Weight:  223 lb 12.3 oz (101.5 kg)    SpO2: 94%  95% 95%   Body mass index is 30.34 kg/(m^2).  General:  WDWN in NAD Gait: Normal HENT: WNL Eyes: Pupils equal Pulmonary: normal non-labored breathing , without Rales, positive rhonchi,  wheezing Cardiac: RRR, without  Murmurs, rubs or gallops; No carotid bruits Abdomen: soft, NT, no masses, no pulsatile mass palpated  Skin: no rashes, ulcers noted;  no Gangrene , no cellulitis; no open wounds;   Vascular Exam/Pulses:palpable radial, femoral, DP/PT pulses bilateral.  No palpable popliteal pulses or pulsatile mass on the left LE.   Musculoskeletal: no muscle wasting or atrophy; no edema  Neurologic: A&O X 3; Appropriate Affect ;  SENSATION: normal; MOTOR FUNCTION: 5/5 Symmetric Speech is fluent/normal   Significant Diagnostic Studies: CBC Lab Results  Component Value Date   WBC 14.8* 07/20/2015   HGB 15.5 07/20/2015   HCT 49.9 07/20/2015   MCV 90.6 07/20/2015   PLT  136* 07/20/2015    BMET    Component Value Date/Time   NA 136 07/20/2015 0612   NA 144 01/14/2015 1027   K 4.6 07/20/2015 0612   K 4.2 01/14/2015 1027   CL 102 07/20/2015 0612   CO2 26 07/20/2015 0612   CO2 28 01/14/2015 1027   GLUCOSE 129* 07/20/2015 0612   GLUCOSE 131 01/14/2015 1027   BUN 28* 07/20/2015 0612   BUN 19.2 01/14/2015 1027   CREATININE 1.43* 07/20/2015 0612   CREATININE 1.3 01/14/2015 1027   CREATININE 1.57* 10/24/2012 1327   CALCIUM 9.2 07/20/2015 0612   CALCIUM 10.0 01/14/2015 1027   GFRNONAA 46* 07/20/2015 0612   GFRNONAA 43* 10/24/2012 1327   GFRAA 53* 07/20/2015 0612   GFRAA 49* 10/24/2012 1327   Estimated Creatinine Clearance: 53.4 mL/min (by C-G formula based on Cr of 1.43).  COAG Lab  Results  Component Value Date   INR 0.99 02/22/2014   INR 0.95 10/12/2012   INR 0.9 01/04/2012   PROTIME 32.4* 06/26/2007   PROTIME 46.8* 06/19/2007   PROTIME 31.2* 06/12/2007     Non-Invasive Vascular Imaging:  Venous doppler revealed  right popliteal aneurysm was noted measuring approximal 6.1 in length, 3.4 x 4.2 cm.  Negative for deep and superficial vein thrombosis.    ASSESSMENT/PLAN:  Left leg popliteal aneurysm. He denise leg/ calf pain with ambulation he has had one incident of left knee pain that lasted a few second in the past, otherwise asymptomatic. We will order CTA abdomin and pelvis to r/o other aneurysms He will f/u in the office with Dr. Trula Slade in 1 week to discuss further work up and plan of treatment. He can be discharged today after CTA is completed.   Laurence Slate [facility] 07/21/2015 9:14 AM

## 2015-07-21 NOTE — Telephone Encounter (Signed)
Spoke with pt to schedule, dpm °

## 2015-07-21 NOTE — Telephone Encounter (Signed)
-----   Message from Mena Goes, RN sent at 07/21/2015 10:44 AM EDT ----- Regarding: Schedule CTA is being done at hospital, Gwenette Greet has already ordered it there.  ----- Message -----    From: Ulyses Amor, PA-C    Sent: 07/21/2015   9:47 AM      To: Vvs Charge Pool  New consult popliteal aneurysm pending CTA abd/pelv f/u with Dr. Trula Slade in 1 week.

## 2015-07-21 NOTE — Progress Notes (Signed)
[name] to be D/Sanders'd Home per MD order.  Discussed with the patient and all questions fully answered.  VSS, Skin clean, dry and intact without evidence of skin break down, no evidence of skin tears noted. IV catheter discontinued intact. Site without signs and symptoms of complications. Dressing and pressure applied.  An After Visit Summary was printed and given to the patient. Patient received prescription. Dr. Sloan Leiter came to bedside to talk to patient about education about eliquis. Handouts also given and teachback used.   D/Sanders education completed with patient/family including follow up instructions, medication list, d/Sanders activities limitations if indicated, with other d/Sanders instructions as indicated by MD - patient able to verbalize understanding, all questions fully answered.   Patient instructed to return to ED, call 911, or call MD for any changes in condition.   Patient escorted via [place], and D/Sanders home via private auto.  Jesus Sanders, Jesus Sanders 07/21/2015 3:20 PM

## 2015-07-21 NOTE — Discharge Instructions (Signed)
Information on my medicine - ELIQUIS (apixaban)  This medication education was reviewed with me or my healthcare representative as part of my discharge preparation.    Why was Eliquis prescribed for you? Eliquis was prescribed to treat blood clots that may have been found in the veins of your legs (deep vein thrombosis) or in your lungs (pulmonary embolism) and to reduce the risk of them occurring again.  What do You need to know about Eliquis ? The starting dose is 10 mg (two 5 mg tablets) taken TWICE daily for the FIRST SEVEN (7) DAYS, then on (enter date)  07/28/15 the dose is reduced to ONE 5 mg tablet taken TWICE daily.  Eliquis may be taken with or without food.   Try to take the dose about the same time in the morning and in the evening. If you have difficulty swallowing the tablet whole please discuss with your pharmacist how to take the medication safely.  Take Eliquis exactly as prescribed and DO NOT stop taking Eliquis without talking to the doctor who prescribed the medication.  Stopping may increase your risk of developing a new blood clot.  Refill your prescription before you run out.  After discharge, you should have regular check-up appointments with your healthcare provider that is prescribing your Eliquis.    What do you do if you miss a dose? If a dose of ELIQUIS is not taken at the scheduled time, take it as soon as possible on the same day and twice-daily administration should be resumed. The dose should not be doubled to make up for a missed dose.  Important Safety Information A possible side effect of Eliquis is bleeding. You should call your healthcare provider right away if you experience any of the following: ? Bleeding from an injury or your nose that does not stop. ? Unusual colored urine (red or dark brown) or unusual colored stools (red or black). ? Unusual bruising for unknown reasons. ? A serious fall or if you hit your head (even if there is no  bleeding).  Some medicines may interact with Eliquis and might increase your risk of bleeding or clotting while on Eliquis. To help avoid this, consult your healthcare provider or pharmacist prior to using any new prescription or non-prescription medications, including herbals, vitamins, non-steroidal anti-inflammatory drugs (NSAIDs) and supplements.  This website has more information on Eliquis (apixaban): http://www.eliquis.com/eliquis/home

## 2015-07-21 NOTE — Care Management Note (Signed)
Case Management Note  Patient Details  Name: Jesus Sanders MRN: [id number] Date of Birth: Oct 28, 1936  Subjective/Objective:                 Patient from home, self care. Will discharge today. Patient is oxygen dependent prior to admission 3-4L. No home therapies needed.    Action/Plan:  DC to home, self care.  Expected Discharge Date:                  Expected Discharge Plan:  Home/Self Care  In-House Referral:     Discharge planning Services  CM Consult  Post Acute Care Choice:    Choice offered to:     DME Arranged:    DME Agency:     HH Arranged:    [place] Agency:     Status of Service:  Completed, signed off  Medicare Important Message Given:    Date Medicare IM Given:    Medicare IM give by:    Date Additional Medicare IM Given:    Additional Medicare Important Message give by:     If discussed at [place] of Stay Meetings, dates discussed:    Additional Comments:  Carles Collet, RN 07/21/2015, 11:38 AM

## 2015-07-21 NOTE — Discharge Summary (Addendum)
PATIENT DETAILS Name: Jesus Sanders Age: 79 y.o. Sex: male Date of Birth: 01/05/1936 MRN: [id number]. Admitting Physician: Jonetta Osgood, MD YQI:[id number] Danne Baxter, MD  Admit Date: 07/19/2015 Discharge date: 07/21/2015  Recommendations for Outpatient Follow-up:  1. Please ensure follow up with [facility]-Dr Brabham-for popliteal aneurysm 2. Please repeat CBC/BMET at next visit 3. Note-changed from Coreg to Metoprolol 4. Note:Chronic but enlarging PE-started on Eliquis 5. Will need monitoring of AAA and descending thoracic anuerysm  PRIMARY DISCHARGE DIAGNOSIS:  Principal Problem:   Acute respiratory failure Active Problems:   Polycythemia vera   CAD (coronary artery disease)   Chronic systolic heart failure   HTN (hypertension)   GERD (gastroesophageal reflux disease)   Hyperlipidemia   Automatic implantable cardioverter-defibrillator- medtronic   COPD GOLD  IV   COPD exacerbation   Popliteal artery aneurysm      PAST MEDICAL HISTORY: Past Medical History  Diagnosis Date  . Polycythemia vera(238.4)     a. Used to receive chronic phlebotomies until 2007, at regional [place].  will restart his phlebotomies from about Mar 15 2011  . Stomach ulcer   . Tobacco abuse     a. cigars  . Duodenal perforation June 2012  . Peritonitis June 2012  . Pneumonia April 2012  . Systolic CHF, chronic     a. 12/2011 Echo: EF 25-30%, mid-dist antsept/inf, apical AK, Gr 1 DD, Triv AI, Mild MR.  . Ischemic cardiomyopathy     a. 01/2012 S/P MDT Protecta single lead ICD, ser # [id number]  . History of DVT (deep vein thrombosis)   . Chronic bronchitis   . GERD (gastroesophageal reflux disease)   . Myocardial infarction   . Shortness of breath   . COPD (chronic obstructive pulmonary disease)   . Hypertension   . CAD (coronary artery disease)     a. 07/2011 Anterior apical STEMI/Cath/PCI: LM nl, LAD 100p/m (2.5 x 8m Mini-Vision BMS), LCX 40p, RCA dominant, nl, EF 30%.     DISCHARGE MEDICATIONS: Current Discharge Medication List    START taking these medications   Details  !! apixaban (ELIQUIS) 5 MG TABS tablet Take 2 tablets (10 mg total) by mouth 2 (two) times daily. 10 mg, Oral, 2 times daily, First dose on Tue 07/21/15 at 1515, For 14 doses Qty: 14 tablet, Refills: 0    !! apixaban (ELIQUIS) 5 MG TABS tablet Take 1 tablet (5 mg total) by mouth 2 (two) times daily. First dose on Tue 07/28/15 at 1000 Qty: 120 tablet, Refills: 0    levofloxacin (LEVAQUIN) 750 MG tablet Take 1 tablet (750 mg total) by mouth daily. Qty: 3 tablet, Refills: 0    metoprolol tartrate (LOPRESSOR) 25 MG tablet Take 1 tablet (25 mg total) by mouth 2 (two) times daily. Qty: 60 tablet, Refills: 0    predniSONE (DELTASONE) 10 MG tablet Take 4 tablets (40 mg) daily for 2 days, then, Take 3 tablets (30 mg) daily for 2 days, then, Take 2 tablets (20 mg) daily for 2 days, then, Take 1 tablets (10 mg) daily for 1 days, then stop Qty: 19 tablet, Refills: 0     !! - Potential duplicate medications found. Please discuss with provider.    CONTINUE these medications which have NOT CHANGED   Details  hydroxyurea (HYDREA) 500 MG capsule TAKE ONE CAPSULE BY MOUTH ONCE DAILY WITH FOOD TO  MINIMIZE  GI  SIDE  EFFECTS Qty: 30 capsule, Refills: 0    albuterol (PROVENTIL HFA;VENTOLIN HFA) 108 (90  BASE) MCG/ACT inhaler Inhale 2 puffs into the lungs every 6 (six) hours as needed for wheezing or shortness of breath. Qty: 18 g, Refills: 6    amLODipine (NORVASC) 10 MG tablet Take 1 tablet (10 mg total) by mouth daily. Qty: 30 tablet, Refills: 0    arformoterol (BROVANA) 15 MCG/2ML NEBU Take 2 mLs (15 mcg total) by nebulization 2 (two) times daily. Dx 491.9 Qty: 120 mL, Refills: 6   Associated Diagnoses: COPD mixed type    budesonide (PULMICORT) 0.25 MG/2ML nebulizer solution Take 2 mLs (0.25 mg total) by nebulization 2 (two) times daily. Dx 491.9 Qty: 120 mL, Refills: 6   Associated  Diagnoses: COPD mixed type    dextromethorphan-guaiFENesin (MUCINEX DM) 30-600 MG per 12 hr tablet Take 1 tablet by mouth 2 (two) times daily as needed for cough.    furosemide (LASIX) 40 MG tablet Take 40 mg by mouth daily.     lovastatin (MEVACOR) 20 MG tablet Take 20 mg by mouth at bedtime.    omeprazole (PRILOSEC) 20 MG capsule Take 20 mg by mouth daily.    valsartan (DIOVAN) 80 MG tablet TAKE ONE TABLET BY MOUTH ONCE DAILY Qty: 30 tablet, Refills: 0      STOP taking these medications     aspirin EC 81 MG tablet      carvedilol (COREG) 25 MG tablet         ALLERGIES:  No Known Allergies  BRIEF HPI:  See H&P, Labs, Consult and Test reports for all details in brief, patient was admitted for evaluation of worsening shortness of breath, felt to have COPD exacerbation and admitted for further evaluation and treatment.  CONSULTATIONS:   vascular surgery  PERTINENT RADIOLOGIC STUDIES: Ct Angio Ao+bifem W/cm &/or Wo/cm  07/21/2015   CLINICAL DATA:  Shortness of breath. Lower extremity venous duplex ultrasound revealed evidence of a right popliteal artery aneurysm.  EXAM: CT ANGIOGRAPHY OF ABDOMINAL AORTA WITH ILIOFEMORAL RUNOFF  TECHNIQUE: Multidetector CT imaging of the abdomen, pelvis and lower extremities was performed using the standard protocol during bolus administration of intravenous contrast. Multiplanar CT image reconstructions and MIPs were obtained to evaluate the vascular anatomy.  CONTRAST:  172m OMNIPAQUE IOHEXOL 350 MG/ML SOLN  COMPARISON:  CTA of the chest on 02/21/2014  FINDINGS: Aorta: The distal descending thoracic aorta shows aneurysmal dilatation measuring 3.9 cm in greatest diameter and containing a small amount of mural thrombus. At the level of the diaphragmatic hiatus, the proximal abdominal aorta measures 3.7 cm in greatest diameter. The aorta tapers to a normal caliber by the level of the celiac axis, measuring 3 cm. In the infrarenal segment, mild  dilatation is present with the distal aorta measuring 3.2 x 3.4 cm.  Mild plaque is present at the origins of the celiac axis and superior mesenteric artery without significant stenosis identified. Mild plaque at the origin of a single right renal artery does not cause significant stenosis. Two separate left renal arteries identified without significant stenosis. The inferior mesenteric artery is normally patent.  Right Lower Extremity: Diffuse calcified plaque is noted involving the common and internal iliac arteries without evidence of significant stenosis. The trunk of the right internal iliac artery demonstrates mild aneurysmal dilatation measuring 11 mm. The proximal external iliac artery shows mild plaque. The external iliac artery shows no significant stenosis. The common femoral artery shows mild plaque without significant stenosis. The femoral bifurcation is normally patent.  The right superficial femoral artery demonstrates scattered plaque without significant stenosis. Maximal  narrowing in the distal thigh approaches 40%. The profunda femoral artery is normally patent.  Elongated and tortuous popliteal artery aneurysm present measuring 3.6 x 4.1 cm in greatest transverse diameter. The opacified lumen of the aneurysm is normal in caliber with the rest of the aneurysm demonstrating thrombosis. Aneurysmal dilatation ends just above the knee joint. The rest of the popliteal artery is normally patent below the knee. Below the knee, dominant runoff is present via the anterior tibial artery which is continuously patent into the foot. The posterior tibial artery is occluded in the proximal calf. The peroneal artery is diseased but is open into the foot.  Left Lower Extremity: Left iliac arteries show calcified plaque without significant stenosis. Mild aneurysmal dilatation of the proximal left internal iliac artery measures 14 mm. The common femoral artery is normally patent. The femoral bifurcation shows normal  patency.  The profunda femoral artery is normally patent. The superficial femoral artery shows scattered plaque without significant stenosis. The popliteal artery demonstrates minimal focal dilatation just above the knee to 11 mm. No significant popliteal stenosis is identified. Below the knee, three-vessel patent runoff is present with fairly equal sized anterior and posterior tibial arteries.  Laminated mural thrombus in the posterior and inferior aspect of the right pulmonary artery extends just into the right lower lobe pulmonary artery. This thrombus measures just over 2 cm in maximal thickness and is more prominent compared to the prior CTA in April, 2015. This is still consistent with chronic thrombus but has clearly increased in prominence.  Calcified plaque seen in the visualized coronary artery tree. Calcified pleural plaque is again identified in the right hemithorax. Prominent adjacent parenchymal scarring and atelectasis near the level of pleural plaque appears similar compared to the prior CT. Multiple collateral veins are again visible in the lower right chest wall lateral right chest wall. The patient has an indwelling implanted defibrillator and collateral venous drainage may relate to chronic venous inflow stenosis in the chest related to the defibrillator.  Review of the MIP images confirms the above findings.  IMPRESSION: 1. Aneurysmal dilatation of the descending thoracic aorta measuring 3.9 cm in greatest diameter. 2. Mild aneurysmal dilatation of the distal abdominal aorta measuring 3.2 x 3.4 cm. 3. Mild aneurysmal disease of the proximal right internal iliac artery measuring 11 mm. 4. Prominent and elongated right popliteal artery aneurysm measuring 3.6 x 4.1 cm in greatest transverse diameter. This aneurysm is largely thrombosed. Patent flow is present through the aneurysmal segment. 5. Occlusion of the proximal right posterior tibial artery. Two-vessel runoff is present on the right below  the knee. 6. Aneurysmal disease of the proximal left internal iliac artery measuring 14 mm. 7. Mild dilatation of the proximal left popliteal artery just above the knee measuring 11 mm. Three-vessel runoff is present below the knee on the left. 8. More prominent laminated mural thrombus in the posterior and inferior aspect of the right pulmonary artery just extending into the right lower lobe pulmonary artery. This does not have the appearance of acute pulmonary embolism but clearly has increased in thickness and prominence compared to the prior CTA of the chest in April, 2015. Consider anticoagulation therapy for this finding.   Electronically Signed   By: Aletta Edouard M.D.   On: 07/21/2015 13:38   Dg Chest Portable 1 View  07/19/2015   CLINICAL DATA:  Respiratory distress  EXAM: PORTABLE CHEST - 1 VIEW  COMPARISON:  02/17/2015  FINDINGS: Lung bases are omitted from the field of  view. Heart size at least moderately enlarged, with central vascular congestion. Left single lead pacer in place. Prominence of the left hilum compatible with prominent vascular pedicle reidentified. Patchy bibasilar airspace opacities are identified but likely not significantly changed since the prior exam, likely scarring.  IMPRESSION: Suboptimal visualization with portable technique and partial obscuration of the lung bases. Emphysema with superimposed bibasilar scarring, although early pneumonia is not excluded. If symptoms persist, consider PA and lateral chest radiographs obtained at full inspiration when the patient is clinically able.   Electronically Signed   By: Conchita Paris M.D.   On: 07/19/2015 12:23     PERTINENT LAB RESULTS: CBC:  Recent Labs  07/19/15 1522 07/20/15 0612  WBC 17.5* 14.8*  HGB 15.4 15.5  HCT 50.1 49.9  PLT 138* 136*   CMET CMP     Component Value Date/Time   NA 136 07/20/2015 0612   NA 144 01/14/2015 1027   K 4.6 07/20/2015 0612   K 4.2 01/14/2015 1027   CL 102 07/20/2015 0612    CO2 26 07/20/2015 0612   CO2 28 01/14/2015 1027   GLUCOSE 129* 07/20/2015 0612   GLUCOSE 131 01/14/2015 1027   BUN 28* 07/20/2015 0612   BUN 19.2 01/14/2015 1027   CREATININE 1.43* 07/20/2015 0612   CREATININE 1.3 01/14/2015 1027   CREATININE 1.57* 10/24/2012 1327   CALCIUM 9.2 07/20/2015 0612   CALCIUM 10.0 01/14/2015 1027   PROT 6.5 07/19/2015 1522   PROT 7.1 01/14/2015 1027   ALBUMIN 3.5 07/19/2015 1522   ALBUMIN 3.9 01/14/2015 1027   AST 19 07/19/2015 1522   AST 17 01/14/2015 1027   ALT 11* 07/19/2015 1522   ALT 12 01/14/2015 1027   ALKPHOS 78 07/19/2015 1522   ALKPHOS 96 01/14/2015 1027   BILITOT 0.6 07/19/2015 1522   BILITOT 0.97 01/14/2015 1027   GFRNONAA 46* 07/20/2015 0612   GFRNONAA 43* 10/24/2012 1327   GFRAA 53* 07/20/2015 0612   GFRAA 49* 10/24/2012 1327    GFR Estimated Creatinine Clearance: 53.4 mL/min (by C-G formula based on Cr of 1.43). No results for input(s): LIPASE, AMYLASE in the last 72 hours.  Recent Labs  07/19/15 1905 07/20/15 0035 07/20/15 0612  TROPONINI <0.03 <0.03 <0.03   Invalid input(s): POCBNP No results for input(s): DDIMER in the last 72 hours. No results for input(s): HGBA1C in the last 72 hours. No results for input(s): CHOL, HDL, LDLCALC, TRIG, CHOLHDL, LDLDIRECT in the last 72 hours. No results for input(s): TSH, T4TOTAL, T3FREE, THYROIDAB in the last 72 hours.  Invalid input(s): FREET3 No results for input(s): VITAMINB12, FOLATE, FERRITIN, TIBC, IRON, RETICCTPCT in the last 72 hours. Coags: No results for input(s): INR in the last 72 hours.  Invalid input(s): PT Microbiology: Recent Results (from the past 240 hour(s))  Blood culture (routine x 2)     Status: None (Preliminary result)   Collection Time: 07/19/15  2:02 PM  Result Value Ref Range Status   Specimen Description BLOOD LEFT ARM  Final   Special Requests BOTTLES DRAWN AEROBIC AND ANAEROBIC 5CC  Final   Culture NO GROWTH 2 DAYS  Final   Report Status  PENDING  Incomplete  Blood culture (routine x 2)     Status: None (Preliminary result)   Collection Time: 07/19/15  2:07 PM  Result Value Ref Range Status   Specimen Description BLOOD RIGHT HAND  Final   Special Requests BOTTLES DRAWN AEROBIC ONLY 5CC  Final   Culture NO GROWTH 2 DAYS  Final   Report Status PENDING  Incomplete  MRSA PCR Screening     Status: Abnormal   Collection Time: 07/19/15 10:33 PM  Result Value Ref Range Status   MRSA by PCR POSITIVE (A) NEGATIVE Final    Comment:        The GeneXpert MRSA Assay (FDA approved for NASAL specimens only), is one component of a comprehensive MRSA colonization surveillance program. It is not intended to diagnose MRSA infection nor to guide or monitor treatment for MRSA infections. RESULT CALLED TO, READ BACK BY AND VERIFIED WITH: IRBY,T RN [id number] 9/11/6 MITCHELL,L 07/19/15      BRIEF HOSPITAL COURSE:  Acute on chronic hypoxic respiratory failure: Secondary to COPD exacerbation, much improved. Required BiPAP on admission in the emergency room, now back on 3-4 L of oxygen via nasal cannula (home regimen)   COPD exacerbation: Significantly improved, no rhonchi-lungs are clear to auscultation. Patient feels he is back to his baseline. Will transition from Solu-Medrol to prednisone, patient asked to continue his regular inhaler/neb regimen and follow up with his PCP and Pulmonologist.   Popliteal aneurysm: Discovered incidentally on a lower extremity Doppler, seen by vascular surgery, plans up for a CT angiogram prior to discharge and outpatient follow-up with vascular surgery for definite workup/treatment.  Pulmonary Embolism:CT Angiogram done for above-demonstrated a chronic PE-this was reconfirmed with Dr Kathlene Cote radiologist, and then case was reviewed with PCCM-Dr Nestor-who looked at the chart-recommendations are to start Anticoagulation. Case was discussed with patient-choices of anticoagulation was discussed-have started  anticoagulation.   Polycythemia vera: Continue Hydrea-follow-up hemoglobin and hematocrit closely-if any significant worsening-will need therapeutic phlebotomy. Please monitor closely in the outpatient setting.  Chronic systolic heart failure: Suspect acute respiratory failure secondary to COPD rather than CHF. CHF remains compensated, continue oral Lasix, metoprolol and ARB. Weight remains stable at 223 pounds.   Descending Thoracic Aneurysm and VPX:TGGY on CT Chest-stable for further monitoring to be done in the outpatient setting  Automatic implantable cardioverter-defibrillator- medtronic  GERD (gastroesophageal reflux disease): Continue PPI.   HTN (hypertension): BP controlled-continue with metoprolol, Lasix, ARB and amlodipine.   CAD (coronary artery disease): Continue aspirin, statin and beta blocker. Troponins negative.  Addendum: will stop ASA given patient will be on Eliquis  History of COPD GOLD IV  TODAY-DAY OF DISCHARGE:  Subjective:   Freda Jackson today has no headache,no chest abdominal pain,no new weakness tingling or numbness, feels much better wants to go home today.   Objective:   Blood pressure 115/73, pulse 78, temperature 97.4 F (36.3 C), temperature source Oral, resp. rate 17, height 6' (1.829 m), weight 101.5 kg (223 lb 12.3 oz), SpO2 93 %.  Intake/Output Summary (Last 24 hours) at 07/21/15 1514 Last data filed at 07/21/15 1220  Gross per 24 hour  Intake 1012.67 ml  Output   1550 ml  Net -537.33 ml   Filed Weights   07/20/15 0500 07/20/15 1944 07/21/15 0531  Weight: 102.6 kg (226 lb 3.1 oz) 102.1 kg (225 lb 1.4 oz) 101.5 kg (223 lb 12.3 oz)    Exam Awake Alert, Oriented *3, No new F.N deficits, Normal affect [place].AT,PERRAL Supple Neck,No JVD, No cervical lymphadenopathy appriciated.  Symmetrical Chest wall movement, Good air movement bilaterally, CTAB RRR,No Gallops,Rubs or new Murmurs, No Parasternal Heave +ve B.Sounds, Abd Soft, Non  tender, No organomegaly appriciated, No rebound -guarding or rigidity. No Cyanosis, Clubbing or edema, No new Rash or bruise  DISCHARGE CONDITION: Stable  DISPOSITION: Home  DISCHARGE INSTRUCTIONS:  Activity:  As tolerated with Full fall precautions use walker/cane & assistance as needed  Get Medicines reviewed and adjusted: Please take all your medications with you for your next visit with your Primary MD  Please request your Primary MD to go over all hospital tests and procedure/radiological results at the follow up, please ask your Primary MD to get all Hospital records sent to his/her office.  If you experience worsening of your admission symptoms, develop shortness of breath, life threatening emergency, suicidal or homicidal thoughts you must seek medical attention immediately by calling 911 or calling your MD immediately  if symptoms less severe.  You must read complete instructions/literature along with all the possible adverse reactions/side effects for all the Medicines you take and that have been prescribed to you. Take any new Medicines after you have completely understood and accpet all the possible adverse reactions/side effects.   Do not drive when taking Pain medications.   Do not take more than prescribed Pain, Sleep and Anxiety Medications  Special Instructions: If you have smoked or chewed Tobacco  in the last 2 yrs please stop smoking, stop any regular Alcohol  and or any Recreational drug use.  Wear Seat belts while driving.  Please note  You were cared for by a hospitalist during your hospital stay. Once you are discharged, your primary care physician will handle any further medical issues. Please note that NO REFILLS for any discharge medications will be authorized once you are discharged, as it is imperative that you return to your primary care physician (or establish a relationship with a primary care physician if you do not have one) for your aftercare needs  so that they can reassess your need for medications and monitor your lab values.   Diet recommendation: Heart Healthy diet   Discharge Instructions    Call MD for:  difficulty breathing, headache or visual disturbances    Complete by:  As directed      Diet - low sodium heart healthy    Complete by:  As directed      Increase activity slowly    Complete by:  As directed            Follow-up Information    Follow up with Annamarie Major, MD In 1 week.   Specialties:  Vascular Surgery, Cardiology   Why:  Office will call you to arrange your appt (sent)   Contact information:   [place] [address] [phone]       Follow up with Leonard Downing, MD. Schedule an appointment as soon as possible for a visit on 07/28/2015.   Specialty:  Family Medicine   Why:  Appointment with Dr. Arelia Sneddon is on 07/28/15 at [facility] information:   [place] [id number] [phone]       Follow up with Christinia Gully, MD On 07/28/2015.   Specialty:  Pulmonary Disease   Why:  appt at 2:45 pm   Contact information:   [address] [address] [phone]      Total Time spent on discharge equals  45 minutes.  SignedOren Binet 07/21/2015 3:14 PM

## 2015-07-21 NOTE — Progress Notes (Signed)
ANTICOAGULATION CONSULT NOTE - Initial Consult  Pharmacy Consult for Apixaban Indication: VTE  No Known Allergies  Patient Measurements: Height: 6' (182.9 cm) Weight: 223 lb 12.3 oz (101.5 kg) IBW/kg (Calculated) : 77.6  Vital Signs: Temp: 97.4 F (36.3 C) (09/13 1447) Temp Source: Oral (09/13 1447) BP: 115/73 mmHg (09/13 1447) Pulse Rate: 78 (09/13 1447)  Labs:  Recent Labs  07/19/15 1522 07/19/15 1905 07/20/15 0035 07/20/15 0612  HGB 15.4  --   --  15.5  HCT 50.1  --   --  49.9  PLT 138*  --   --  136*  CREATININE 1.29*  --   --  1.43*  TROPONINI  --  <0.03 <0.03 <0.03    Estimated Creatinine Clearance: 53.4 mL/min (by C-G formula based on Cr of 1.43).   Medical History: Past Medical History  Diagnosis Date  . Polycythemia vera(238.4)     a. Used to receive chronic phlebotomies until 2007, at regional [place].  will restart his phlebotomies from about Mar 15 2011  . Stomach ulcer   . Tobacco abuse     a. cigars  . Duodenal perforation June 2012  . Peritonitis June 2012  . Pneumonia April 2012  . Systolic CHF, chronic     a. 12/2011 Echo: EF 25-30%, mid-dist antsept/inf, apical AK, Gr 1 DD, Triv AI, Mild MR.  . Ischemic cardiomyopathy     a. 01/2012 S/P MDT Protecta single lead ICD, ser # [id number]  . History of DVT (deep vein thrombosis)   . Chronic bronchitis   . GERD (gastroesophageal reflux disease)   . Myocardial infarction   . Shortness of breath   . COPD (chronic obstructive pulmonary disease)   . Hypertension   . CAD (coronary artery disease)     a. 07/2011 Anterior apical STEMI/Cath/PCI: LM nl, LAD 100p/m (2.5 x 79m Mini-Vision BMS), LCX 40p, RCA dominant, nl, EF 30%.    Medications:  Prescriptions prior to admission  Medication Sig Dispense Refill Last Dose  . hydroxyurea (HYDREA) 500 MG capsule TAKE ONE CAPSULE BY MOUTH ONCE DAILY WITH FOOD TO  MINIMIZE  GI  SIDE  EFFECTS 30 capsule 0 unknown  . albuterol (PROVENTIL HFA;VENTOLIN HFA)  108 (90 BASE) MCG/ACT inhaler Inhale 2 puffs into the lungs every 6 (six) hours as needed for wheezing or shortness of breath. (Patient not taking: Reported on 02/17/2015) 18 g 6 Not Taking  . amLODipine (NORVASC) 10 MG tablet Take 1 tablet (10 mg total) by mouth daily. 30 tablet 0 Unknown  . arformoterol (BROVANA) 15 MCG/2ML NEBU Take 2 mLs (15 mcg total) by nebulization 2 (two) times daily. Dx 491.9 120 mL 6 Unknown  . aspirin EC 81 MG tablet Take 81 mg by mouth daily.   Unknown  . budesonide (PULMICORT) 0.25 MG/2ML nebulizer solution Take 2 mLs (0.25 mg total) by nebulization 2 (two) times daily. Dx 491.9 120 mL 6 Unknown  . carvedilol (COREG) 25 MG tablet Take 25 mg by mouth 2 (two) times daily with a meal.   Unknown  . dextromethorphan-guaiFENesin (MUCINEX DM) 30-600 MG per 12 hr tablet Take 1 tablet by mouth 2 (two) times daily as needed for cough.   Unknown  . furosemide (LASIX) 40 MG tablet Take 40 mg by mouth daily.    Unknown  . hydroxyurea (HYDREA) 500 MG capsule TAKE ONE CAPSULE BY MOUTH ONCE DAILY WITH FOOD TO  MINIMIZE  GI  SIDE  EFFECTS (Patient not taking: Reported on 07/20/2015) 30 capsule  0 Not Taking at Unknown time  . lovastatin (MEVACOR) 20 MG tablet Take 20 mg by mouth at bedtime.   Unknown  . omeprazole (PRILOSEC) 20 MG capsule Take 20 mg by mouth daily.   Unknown  . 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 Chronic thrombus >> but increased/growing.  To start apixaban.  Goal of Therapy:  Tehrapeutic Anticoagulation Monitor platelets by anticoagulation protocol: Yes   Plan:  Apixaban '10mg'$  PO BID x 7 days, then '5mg'$  PO BID D/C SQ Lovenox (VTE px dose - last dose 9/12 PM) Apixaban education  [place], Pharm.D., BCPS Clinical Pharmacist Pager  [phone] 07/21/2015 3:08 PM

## 2015-07-24 ENCOUNTER — Encounter: Payer: Self-pay | Admitting: Surgery

## 2015-07-24 LAB — CULTURE, BLOOD (ROUTINE X 2)
CULTURE: NO GROWTH
CULTURE: NO GROWTH

## 2015-07-27 ENCOUNTER — Encounter: Payer: Self-pay | Admitting: Surgery

## 2015-07-27 ENCOUNTER — Other Ambulatory Visit: Payer: Self-pay | Admitting: *Deleted

## 2015-07-27 ENCOUNTER — Ambulatory Visit (INDEPENDENT_AMBULATORY_CARE_PROVIDER_SITE_OTHER): Payer: Medicare Other | Admitting: *Deleted

## 2015-07-27 ENCOUNTER — Ambulatory Visit: Payer: Medicare Other | Admitting: Surgery

## 2015-07-27 DIAGNOSIS — Z01810 Encounter for preprocedural cardiovascular examination: Secondary | ICD-10-CM

## 2015-07-27 DIAGNOSIS — I255 Ischemic cardiomyopathy: Secondary | ICD-10-CM | POA: Diagnosis not present

## 2015-07-27 DIAGNOSIS — I5022 Chronic systolic (congestive) heart failure: Secondary | ICD-10-CM

## 2015-07-27 DIAGNOSIS — I739 Peripheral vascular disease, unspecified: Secondary | ICD-10-CM

## 2015-07-27 DIAGNOSIS — I724 Aneurysm of artery of lower extremity: Secondary | ICD-10-CM

## 2015-07-27 NOTE — Progress Notes (Signed)
Remote ICD transmission.   

## 2015-07-28 ENCOUNTER — Encounter: Payer: Self-pay | Admitting: Internal Medicine

## 2015-07-28 ENCOUNTER — Ambulatory Visit (INDEPENDENT_AMBULATORY_CARE_PROVIDER_SITE_OTHER): Payer: Medicare Other | Admitting: Internal Medicine

## 2015-07-28 VITALS — BP 126/74 | HR 97 | Ht 72.0 in | Wt 225.6 lb

## 2015-07-28 DIAGNOSIS — J449 Chronic obstructive pulmonary disease, unspecified: Secondary | ICD-10-CM | POA: Diagnosis not present

## 2015-07-28 DIAGNOSIS — J9611 Chronic respiratory failure with hypoxia: Secondary | ICD-10-CM | POA: Diagnosis not present

## 2015-07-28 DIAGNOSIS — I255 Ischemic cardiomyopathy: Secondary | ICD-10-CM | POA: Diagnosis not present

## 2015-07-28 NOTE — Progress Notes (Signed)
Subjective:    Patient ID: Jesus Sanders, male    DOB: [birth year]   MRN: [id number]    Brief patient profile:  84 yowm quit smoking 2014 admitted to [facility] with establish GOLD IV copd as of 04/03/14   Admit date: 02/21/2014  Discharge date: 02/23/2014     Discharge Diagnoses:  Acute-on-chronic respiratory failure   Polycythemia vera(238.4)  HTN (hypertension)  Ischemic cardiomyopathy  GERD (gastroesophageal reflux disease)  COPD exacerbation, likely         Filed Weights    02/21/14 1945  02/22/14 0502   Weight:  100.8 kg (222 lb 3.6 oz)  100.6 kg (221 lb 12.5 oz)   History of present illness:  79 y.o. male came to [facility] ed 02/21/2014 with shortness of breath. No history of polycythemia vera, CAD history 07/2011 anterior apical MI S./P. BMS to LAD resulting in ischemic cardiomyopathy with systolic CHF [systolic 95%] s/p defibrillator 01/2012, severe COPD FEV1/FVC 0.40, H/o DVt in 2001 and [place] to have AECOPD and admitted for the same  Hospital Course:  Acute-on-chronic respiratory failure-likely AECOPD-treat with inhaled nebulizers every 4 when necessary, add steroids to 60 mg prednisone . Patient educated regarding inhalers as he is only on albuterol which is a rescue inhaler but he thought that his nebulizer was to be used Q6 when necessary and inhaler was a controller med  RT to teach use of proper use of inhaler and when to use what  I will prescribe for him Advair forCOPD Gold stage IV at least . He will need a burst of steroids for about 5-7 days and anticipate he will do well.might benefit from Cardio-pulm rehab  Polycythemia vera(238.4) currently with anemia presently-currently on Hydrea and gets prophylactic phlebotomy. To keep his hematocrit less than 40 which is clearly not at that . hydroxyurea was restarted February 2015. He will followup with his outpatient oncologist when needed  HTN (hypertension)-continue the medications as below. Blood pressure only moderately  controlled currently. Increase Lisinopril or add Amlodpine if still elevated in am  Ischemic cardiomyopathy, CAD history , placement of defibrillator 2012-keep on telemetry. Currently doing well -may benefit from alternative agent other than Coreg 25 mg as has COPD . Continue ACE inhibitor lisinopril 10 daily , continue aspirin 81 mg daily  GERD (gastroesophageal reflux disease) continue Protonix in place of Prilosec  History of DVT 2001-completed therapy with Coumadin  History CVA-stable currently  acute kidney injury BN/creatinine 25/1.24. Labs about the same monitor in the am. Lasix held initially but re-started. Will need close monitoring of bmet as OP  Hyperlipidemia-hold statin for now   05/16/2014 1st [facility] office visit/ [name] / [place] IV COPD  Chief Complaint  Patient presents with  . Pulmoanary Consult    Pt states that he was referred per Cardiac Rehab.  He c/o DOE for "I don't know, several years".  Occurs when "I do too much".     indolent onset doe x sev years to point where has trouble to mb and back flat has to stop half way back but not every day Also has it sitting still  Also has it also sometimes lying down Can do all but the most rigorous ex at rehab s 02  Better on dulera 100 2bid but not sure of what all meds he's using.   >>d/c ACE and rx Diovan    06/02/2014 Np note  Patient returns for a followup and medication review. Unfortunately, pt did not bring  any meds with him today.  He brought his pill box , his grand daughter does his meds.  He does not know any of his meds.  He does not have any prescription coverage.  Discussed with grandaughter on phone , can not afford inhalers.  Goes to cardiopulmonary  rehab. Uses Oxygen with exercise.  Not taking Dulera since last ov.   Reports breathing is somewhat worse since last ov with SOB, wheezing, thorat congestion. Last visit. Patient was changed off of his ACE inhibitor and started on Diovan. Gets short of  breath with minimal activity .  O2 sats 94% at rest  Walking O2 sats 87%.  Patient denies any hemoptysis, chest pain, orthopnea, abdominal pain, nausea, vomiting, or leg swelling.  >>started on O2. With walking. rx brovana/pulmicort neb    06/23/2014   NP note Last ov was started on pulmicort and brovana (he does not have rx coverage). And started on O2 with walking for desats.  He appears to be taking meds correctly. Says he breathing is stable with no flare in cough or wheezing.  rec Follow med calendar closely and bring to each visit.  Continue on current regimen .  Change to Mucinex DM Twice daily  As needed  Cough/congestion  May use Albuterol Neb every 4hrs as needed for wheezing/shortness of breath -this is your rescue medicine.  Continue with Oxygen with walking.    02/17/2015 f/u ov/Wert re: GOLD IV copd/ 02 dep/ no med cal/ no 02/no grand daughter, thoroughly confused with details of care  Chief Complaint  Patient presents with  . Follow-up    Pt states breathing is progressively worse since his last visit here. He was SOB walking from lobby to exam room today. He states SOB sometimes at rest. He also c/o clearing his throat often.    rec Be sure to take your furosemide when your legs/feet are swollen  Please remember to go to the lab and x-ray department downstairs for your tests - we will call you with the results when they are available. Try to wear your 02 24/7 at 2lpm but especially at bedtime and with any activity more than room to room walking   See Tammy NP w/in 2 weeks> did not do    Admit Date: 07/19/2015 Discharge date: 07/21/2015  Recommendations for Outpatient Follow-up:  1. Please ensure follow up with [facility]-Dr Brabham-for popliteal aneurysm 2. Please repeat CBC/BMET at next visit 3. Note-changed from Coreg to Metoprolol 4. Note:Chronic but enlarging PE-started on Eliquis 5. Will need monitoring of AAA and descending thoracic anuerysm  PRIMARY DISCHARGE  DIAGNOSIS: Principal Problem:  Acute respiratory failure Active Problems:  Polycythemia vera  CAD (coronary artery disease)  Chronic systolic heart failure  HTN (hypertension)  GERD (gastroesophageal reflux disease)  Hyperlipidemia  Automatic implantable cardioverter-defibrillator- medtronic  COPD GOLD IV  COPD exacerbation  Popliteal artery aneurysm    PAST MEDICAL HISTORY: Past Medical History  Diagnosis Date  . Polycythemia vera(238.4)     a. Used to receive chronic phlebotomies until 2007, at regional [place]. will restart his phlebotomies from about Mar 15 2011  . Stomach ulcer   . Tobacco abuse     a. cigars  . Duodenal perforation June 2012  . Peritonitis June 2012  . Pneumonia April 2012  . Systolic CHF, chronic     a. 12/2011 Echo: EF 25-30%, mid-dist antsept/inf, apical AK, Gr 1 DD, Triv AI, Mild MR.  . Ischemic cardiomyopathy     a. 01/2012  S/P MDT Protecta single lead ICD, ser # [id number]  . History of DVT (deep vein thrombosis)   . Chronic bronchitis   . GERD (gastroesophageal reflux disease)   . Myocardial infarction   . Shortness of breath   . COPD (chronic obstructive pulmonary disease)   . Hypertension   . CAD (coronary artery disease)     a. 07/2011 Anterior apical STEMI/Cath/PCI: LM nl, LAD 100p/m (2.5 x 47m Mini-Vision BMS), LCX 40p, RCA dominant, nl, EF 30%.    DISCHARGE MEDICATIONS: Current Discharge Medication List    START taking these medications   Details  !! apixaban (ELIQUIS) 5 MG TABS tablet Take 2 tablets (10 mg total) by mouth 2 (two) times daily. 10 mg, Oral, 2 times daily, First dose on Tue 07/21/15 at 1515, For 14 doses Qty: 14 tablet, Refills: 0    !! apixaban (ELIQUIS) 5 MG TABS tablet Take 1 tablet (5 mg total) by mouth 2 (two) times daily. First dose on Tue 07/28/15 at 1000 Qty: 120 tablet, Refills: 0    levofloxacin (LEVAQUIN) 750  MG tablet Take 1 tablet (750 mg total) by mouth daily. Qty: 3 tablet, Refills: 0    metoprolol tartrate (LOPRESSOR) 25 MG tablet Take 1 tablet (25 mg total) by mouth 2 (two) times daily. Qty: 60 tablet, Refills: 0    predniSONE (DELTASONE) 10 MG tablet Take 4 tablets (40 mg) daily for 2 days, then, Take 3 tablets (30 mg) daily for 2 days, then, Take 2 tablets (20 mg) daily for 2 days, then, Take 1 tablets (10 mg) daily for 1 days, then stop Qty: 19 tablet, Refills: 0    !! - Potential duplicate medications found. Please discuss with provider.    CONTINUE these medications which have NOT CHANGED   Details  hydroxyurea (HYDREA) 500 MG capsule TAKE ONE CAPSULE BY MOUTH ONCE DAILY WITH FOOD TO MINIMIZE GI SIDE EFFECTS Qty: 30 capsule, Refills: 0    albuterol (PROVENTIL HFA;VENTOLIN HFA) 108 (90 BASE) MCG/ACT inhaler Inhale 2 puffs into the lungs every 6 (six) hours as needed for wheezing or shortness of breath. Qty: 18 g, Refills: 6    amLODipine (NORVASC) 10 MG tablet Take 1 tablet (10 mg total) by mouth daily. Qty: 30 tablet, Refills: 0    arformoterol (BROVANA) 15 MCG/2ML NEBU Take 2 mLs (15 mcg total) by nebulization 2 (two) times daily. Dx 491.9 Qty: 120 mL, Refills: 6   Associated Diagnoses: COPD mixed type    budesonide (PULMICORT) 0.25 MG/2ML nebulizer solution Take 2 mLs (0.25 mg total) by nebulization 2 (two) times daily. Dx 491.9 Qty: 120 mL, Refills: 6   Associated Diagnoses: COPD mixed type    dextromethorphan-guaiFENesin (MUCINEX DM) 30-600 MG per 12 hr tablet Take 1 tablet by mouth 2 (two) times daily as needed for cough.    furosemide (LASIX) 40 MG tablet Take 40 mg by mouth daily.     lovastatin (MEVACOR) 20 MG tablet Take 20 mg by mouth at bedtime.    omeprazole (PRILOSEC) 20 MG capsule Take 20 mg by mouth daily.    valsartan (DIOVAN) 80 MG tablet TAKE ONE TABLET BY MOUTH ONCE DAILY Qty: 30 tablet, Refills: 0        STOP taking these medications     aspirin EC 81 MG tablet      carvedilol (COREG) 25 MG tablet         ALLERGIES: No Known Allergies  BRIEF HPI: See H&P, Labs, Consult and Test reports for  all details in brief, patient was admitted for evaluation of worsening shortness of breath, felt to have COPD exacerbation and admitted for further evaluation and treatment.  CONSULTATIONS:  vascular surgery  PERTINENT RADIOLOGIC STUDIES:  Imaging Results    Ct Angio Ao+bifem W/cm &/or Wo/cm  07/21/2015 CLINICAL DATA: Shortness of breath. Lower extremity venous duplex ultrasound revealed evidence of a right popliteal artery aneurysm. EXAM: CT ANGIOGRAPHY OF ABDOMINAL AORTA WITH ILIOFEMORAL RUNOFF TECHNIQUE: Multidetector CT imaging of the abdomen, pelvis and lower extremities was performed using the standard protocol during bolus administration of intravenous contrast. Multiplanar CT image reconstructions and MIPs were obtained to evaluate the vascular anatomy. CONTRAST: 166m OMNIPAQUE IOHEXOL 350 MG/ML SOLN COMPARISON: CTA of the chest on 02/21/2014 FINDINGS: Aorta: The distal descending thoracic aorta shows aneurysmal dilatation measuring 3.9 cm in greatest diameter and containing a small amount of mural thrombus. At the level of the diaphragmatic hiatus, the proximal abdominal aorta measures 3.7 cm in greatest diameter. The aorta tapers to a normal caliber by the level of the celiac axis, measuring 3 cm. In the infrarenal segment, mild dilatation is present with the distal aorta measuring 3.2 x 3.4 cm. Mild plaque is present at the origins of the celiac axis and superior mesenteric artery without significant stenosis identified. Mild plaque at the origin of a single right renal artery does not cause significant stenosis. Two separate left renal arteries identified without significant stenosis. The inferior mesenteric artery is normally patent. Right Lower Extremity:  Diffuse calcified plaque is noted involving the common and internal iliac arteries without evidence of significant stenosis. The trunk of the right internal iliac artery demonstrates mild aneurysmal dilatation measuring 11 mm. The proximal external iliac artery shows mild plaque. The external iliac artery shows no significant stenosis. The common femoral artery shows mild plaque without significant stenosis. The femoral bifurcation is normally patent. The right superficial femoral artery demonstrates scattered plaque without significant stenosis. Maximal narrowing in the distal thigh approaches 40%. The profunda femoral artery is normally patent. Elongated and tortuous popliteal artery aneurysm present measuring 3.6 x 4.1 cm in greatest transverse diameter. The opacified lumen of the aneurysm is normal in caliber with the rest of the aneurysm demonstrating thrombosis. Aneurysmal dilatation ends just above the knee joint. The rest of the popliteal artery is normally patent below the knee. Below the knee, dominant runoff is present via the anterior tibial artery which is continuously patent into the foot. The posterior tibial artery is occluded in the proximal calf. The peroneal artery is diseased but is open into the foot. Left Lower Extremity: Left iliac arteries show calcified plaque without significant stenosis. Mild aneurysmal dilatation of the proximal left internal iliac artery measures 14 mm. The common femoral artery is normally patent. The femoral bifurcation shows normal patency. The profunda femoral artery is normally patent. The superficial femoral artery shows scattered plaque without significant stenosis. The popliteal artery demonstrates minimal focal dilatation just above the knee to 11 mm. No significant popliteal stenosis is identified. Below the knee, three-vessel patent runoff is present with fairly equal sized anterior and posterior tibial arteries. Laminated mural thrombus in the posterior  and inferior aspect of the right pulmonary artery extends just into the right lower lobe pulmonary artery. This thrombus measures just over 2 cm in maximal thickness and is more prominent compared to the prior CTA in April, 2015. This is still consistent with chronic thrombus but has clearly increased in prominence. Calcified plaque seen in the visualized coronary artery tree.  Calcified pleural plaque is again identified in the right hemithorax. Prominent adjacent parenchymal scarring and atelectasis near the level of pleural plaque appears similar compared to the prior CT. Multiple collateral veins are again visible in the lower right chest wall lateral right chest wall. The patient has an indwelling implanted defibrillator and collateral venous drainage may relate to chronic venous inflow stenosis in the chest related to the defibrillator. Review of the MIP images confirms the above findings. IMPRESSION: 1. Aneurysmal dilatation of the descending thoracic aorta measuring 3.9 cm in greatest diameter. 2. Mild aneurysmal dilatation of the distal abdominal aorta measuring 3.2 x 3.4 cm. 3. Mild aneurysmal disease of the proximal right internal iliac artery measuring 11 mm. 4. Prominent and elongated right popliteal artery aneurysm measuring 3.6 x 4.1 cm in greatest transverse diameter. This aneurysm is largely thrombosed. Patent flow is present through the aneurysmal segment. 5. Occlusion of the proximal right posterior tibial artery. Two-vessel runoff is present on the right below the knee. 6. Aneurysmal disease of the proximal left internal iliac artery measuring 14 mm. 7. Mild dilatation of the proximal left popliteal artery just above the knee measuring 11 mm. Three-vessel runoff is present below the knee on the left. 8. More prominent laminated mural thrombus in the posterior and inferior aspect of the right pulmonary artery just extending into the right lower lobe pulmonary artery. This does not have the  appearance of acute pulmonary embolism but clearly has increased in thickness and prominence compared to the prior CTA of the chest in April, 2015. Consider anticoagulation therapy for this finding. Electronically Signed By: Aletta Edouard M.D. On: 07/21/2015 13:38   Dg Chest Portable 1 View  07/19/2015 CLINICAL DATA: Respiratory distress EXAM: PORTABLE CHEST - 1 VIEW COMPARISON: 02/17/2015 FINDINGS: Lung bases are omitted from the field of view. Heart size at least moderately enlarged, with central vascular congestion. Left single lead pacer in place. Prominence of the left hilum compatible with prominent vascular pedicle reidentified. Patchy bibasilar airspace opacities are identified but likely not significantly changed since the prior exam, likely scarring. IMPRESSION: Suboptimal visualization with portable technique and partial obscuration of the lung bases. Emphysema with superimposed bibasilar scarring, although early pneumonia is not excluded. If symptoms persist, consider PA and lateral chest radiographs obtained at full inspiration when the patient is clinically able. Electronically Signed By: Conchita Paris M.D. On: 07/19/2015 12:23      PERTINENT LAB RESULTS: CBC:  Recent Labs (last 2 labs)      Recent Labs  07/19/15 1522 07/20/15 0612  WBC 17.5* 14.8*  HGB 15.4 15.5  HCT 50.1 49.9  PLT 138* 136*     CMET CMP  Labs (Brief)       Component Value Date/Time   NA 136 07/20/2015 0612   NA 144 01/14/2015 1027   K 4.6 07/20/2015 0612   K 4.2 01/14/2015 1027   CL 102 07/20/2015 0612   CO2 26 07/20/2015 0612   CO2 28 01/14/2015 1027   GLUCOSE 129* 07/20/2015 0612   GLUCOSE 131 01/14/2015 1027   BUN 28* 07/20/2015 0612   BUN 19.2 01/14/2015 1027   CREATININE 1.43* 07/20/2015 0612   CREATININE 1.3 01/14/2015 1027   CREATININE 1.57* 10/24/2012 1327   CALCIUM 9.2 07/20/2015  0612   CALCIUM 10.0 01/14/2015 1027   PROT 6.5 07/19/2015 1522   PROT 7.1 01/14/2015 1027   ALBUMIN 3.5 07/19/2015 1522   ALBUMIN 3.9 01/14/2015 1027   AST 19 07/19/2015 1522   AST 17  01/14/2015 1027   ALT 11* 07/19/2015 1522   ALT 12 01/14/2015 1027   ALKPHOS 78 07/19/2015 1522   ALKPHOS 96 01/14/2015 1027   BILITOT 0.6 07/19/2015 1522   BILITOT 0.97 01/14/2015 1027   GFRNONAA 46* 07/20/2015 0612   GFRNONAA 43* 10/24/2012 1327   GFRAA 53* 07/20/2015 0612   GFRAA 49* 10/24/2012 1327      GFR Estimated Creatinine Clearance: 53.4 mL/min (by C-G formula based on Cr of 1.43).  Recent Labs (last 2 labs)     No results for input(s): LIPASE, AMYLASE in the last 72 hours.    Recent Labs (last 2 labs)      Recent Labs  07/19/15 1905 07/20/15 0035 07/20/15 0612  TROPONINI <0.03 <0.03 <0.03      Recent Labs (last 2 labs)     Invalid input(s): POCBNP    Recent Labs (last 2 labs)     No results for input(s): DDIMER in the last 72 hours.    Recent Labs (last 2 labs)     No results for input(s): HGBA1C in the last 72 hours.    Recent Labs (last 2 labs)     No results for input(s): CHOL, HDL, LDLCALC, TRIG, CHOLHDL, LDLDIRECT in the last 72 hours.    Recent Labs (last 2 labs)     No results for input(s): TSH, T4TOTAL, T3FREE, THYROIDAB in the last 72 hours.  Invalid input(s): FREET3    Recent Labs (last 2 labs)     No results for input(s): VITAMINB12, FOLATE, FERRITIN, TIBC, IRON, RETICCTPCT in the last 72 hours.   Coags:  Recent Labs (last 2 labs)     No results for input(s): INR in the last 72 hours.  Invalid input(s): PT   Microbiology: Recent Results (from the past 240 hour(s))  Blood culture (routine x 2) Status: None (Preliminary result)   Collection Time: 07/19/15 2:02 PM  Result Value Ref Range Status   Specimen Description BLOOD LEFT ARM  Final   Special  Requests BOTTLES DRAWN AEROBIC AND ANAEROBIC 5CC  Final   Culture NO GROWTH 2 DAYS  Final   Report Status PENDING  Incomplete  Blood culture (routine x 2) Status: None (Preliminary result)   Collection Time: 07/19/15 2:07 PM  Result Value Ref Range Status   Specimen Description BLOOD RIGHT HAND  Final   Special Requests BOTTLES DRAWN AEROBIC ONLY 5CC  Final   Culture NO GROWTH 2 DAYS  Final   Report Status PENDING  Incomplete  MRSA PCR Screening Status: Abnormal   Collection Time: 07/19/15 10:33 PM  Result Value Ref Range Status   MRSA by PCR POSITIVE (A) NEGATIVE Final    Comment:   The GeneXpert MRSA Assay (FDA approved for NASAL specimens only), is one component of a comprehensive MRSA colonization surveillance program. It is not intended to diagnose MRSA infection nor to guide or monitor treatment for MRSA infections. RESULT CALLED TO, READ BACK BY AND VERIFIED WITH: IRBY,T RN [id number] 9/11/6 MITCHELL,L 07/19/15      BRIEF HOSPITAL COURSE:  Acute on chronic hypoxic respiratory failure: Secondary to COPD exacerbation, much improved. Required BiPAP on admission in the emergency room, now back on 3-4 L of oxygen via nasal cannula (home regimen)   COPD exacerbation: Significantly improved, no rhonchi-lungs are clear to auscultation. Patient feels he is back to his baseline. Will transition from Solu-Medrol to prednisone, patient asked to continue his regular inhaler/neb regimen and follow up with his PCP and Pulmonologist.  Popliteal aneurysm: Discovered incidentally on a lower extremity Doppler, seen by vascular surgery, plans up for a CT angiogram prior to discharge and outpatient follow-up with vascular surgery for definite workup/treatment.  Pulmonary Embolism:CT Angiogram done for above-demonstrated a chronic PE-this was reconfirmed with Dr Kathlene Cote radiologist, and then case was reviewed with PCCM-Dr  Nestor-who looked at the chart-recommendations are to start Anticoagulation. Case was discussed with patient-choices of anticoagulation was discussed-have started anticoagulation.   Polycythemia vera: Continue Hydrea-follow-up hemoglobin and hematocrit closely-if any significant worsening-will need therapeutic phlebotomy. Please monitor closely in the outpatient setting.  Chronic systolic heart failure: Suspect acute respiratory failure secondary to COPD rather than CHF. CHF remains compensated, continue oral Lasix, metoprolol and ARB. Weight remains stable at 223 pounds.   Descending Thoracic Aneurysm and HYW:VPXT on CT Chest-stable for further monitoring to be done in the outpatient setting  Automatic implantable cardioverter-defibrillator- medtronic  GERD (gastroesophageal reflux disease): Continue PPI.   HTN (hypertension): BP controlled-continue with metoprolol, Lasix, ARB and amlodipine.   CAD (coronary artery disease): Continue aspirin, statin and beta blocker. Troponins negative.  Addendum: will stop ASA given patient will be on Eliquis  History of COPD GOLD IV         07/28/2015 extended post hosp f/u ov/Wert re: GOLD IV copd/ chronic resp failure Chief Complaint  Patient presents with  . HFU    Pt states that his breathing is doing well overall. He c/o frequent need to clear his throat.     Patient again is very confused with details of care. He did not take advantage of my offer to do medication reconciliation with my nurse practitioner and shows very little insight into his medications including how to use his nebulizer versus his other medicines in an appropriate way. He is now using oxygen 2 L 24 hours a day. However, he is extremely sedentary and cannot really answer the question ? what activity limited from doing because of his breathing?    No obvious day to day or daytime variabilty or assoc excess or purulent mucus  or cp or chest tightness, subjective wheeze  overt sinus or hb symptoms. No unusual exp hx or h/o childhood pna/ asthma or knowledge of premature birth.  Sleeping ok without nocturnal  or early am exacerbation  of respiratory  c/o's or need for noct saba. Also denies any obvious fluctuation of symptoms with weather or environmental changes or other aggravating or alleviating factors except as outlined above   Current Medications, Allergies, Complete Past Medical History, Past Surgical History, Family History, and Social History were reviewed in [employer] record.  ROS  The following are not active complaints unless bolded sore throat, dysphagia, dental problems, itching, sneezing,  nasal congestion or excess/ purulent secretions, ear ache,   fever, chills, sweats, unintended wt loss, pleuritic or exertional cp, hemoptysis,  orthopnea pnd or leg swelling  , presyncope, palpitations, heartburn, abdominal pain, anorexia, nausea, vomiting, diarrhea  or change in bowel or urinary habits, change in stools or urine, dysuria,hematuria,  rash, arthralgias, visual complaints, headache, numbness weakness or ataxia or problems with walking or coordination,  change in mood/affect or memory.                        Objective:   Physical Exam   07/28/2015        226  Wt Readings from Last 3 Encounters:  02/17/15 234 lb 3.2 oz (106.232 kg)  01/22/15 240 lb 12.8  oz (109.226 kg)  01/14/15 229 lb 4.8 oz (104.01 kg)    Vital signs reviewed     amb elderly wm nad freq throat clearing   HEENT mild turbinate edema.  Oropharynx no thrush or excess pnd or cobblestoning.  No JVD or cervical adenopathy. Mild accessory muscle hypertrophy. Trachea midline, nl thryroid. Chest was hyperinflated by percussion with diminished breath sounds and moderate increased exp time without wheeze. Regular rate and rhythm without murmur gallop or rub or increase P2  - trace to 1+ pitting edema both lower ext .  Abd: no hsm, nl excursion. Ext warm  without cyanosis or clubbing.       pCXR  :   07/19/15  I personally reviewed images and agree with radiology impression as follows:   Suboptimal visualization with portable technique and partial obscuration of the lung bases. Emphysema with superimposed bibasilar scarring   labs reviewed   Chemistry      Component Value Date/Time   NA 136 07/20/2015 0612   NA 144 01/14/2015 1027   K 4.6 07/20/2015 0612   K 4.2 01/14/2015 1027   CL 102 07/20/2015 0612   CO2 26 07/20/2015 0612   CO2 28 01/14/2015 1027   BUN 28* 07/20/2015 0612   BUN 19.2 01/14/2015 1027   CREATININE 1.43* 07/20/2015 0612   CREATININE 1.3 01/14/2015 1027   CREATININE 1.57* 10/24/2012 1327      Component Value Date/Time   CALCIUM 9.2 07/20/2015 0612   CALCIUM 10.0 01/14/2015 1027   ALKPHOS 78 07/19/2015 1522   ALKPHOS 96 01/14/2015 1027   AST 19 07/19/2015 1522   AST 17 01/14/2015 1027   ALT 11* 07/19/2015 1522   ALT 12 01/14/2015 1027   BILITOT 0.6 07/19/2015 1522   BILITOT 0.97 01/14/2015 1027       Lab Results  Component Value Date   WBC 14.8* 07/20/2015   HGB 15.5 07/20/2015   HCT 49.9 07/20/2015   MCV 90.6 07/20/2015   PLT 136* 07/20/2015          Assessment & Plan:   Outpatient Encounter Prescriptions as of 07/28/2015  Medication Sig  . albuterol (PROVENTIL HFA;VENTOLIN HFA) 108 (90 BASE) MCG/ACT inhaler Inhale 2 puffs into the lungs every 6 (six) hours as needed for wheezing or shortness of breath.  Marland Kitchen amLODipine (NORVASC) 10 MG tablet Take 1 tablet (10 mg total) by mouth daily.  Marland Kitchen apixaban (ELIQUIS) 5 MG TABS tablet Take 1 tablet (5 mg total) by mouth 2 (two) times daily. First dose on Tue 07/28/15 at 1000  . arformoterol (BROVANA) 15 MCG/2ML NEBU Take 2 mLs (15 mcg total) by nebulization 2 (two) times daily. Dx 491.9  . budesonide (PULMICORT) 0.25 MG/2ML nebulizer solution Take 2 mLs (0.25 mg total) by nebulization 2 (two) times daily. Dx 491.9  . dextromethorphan-guaiFENesin (MUCINEX  DM) 30-600 MG per 12 hr tablet Take 1 tablet by mouth 2 (two) times daily as needed for cough.  . furosemide (LASIX) 40 MG tablet Take 40 mg by mouth daily.   . hydroxyurea (HYDREA) 500 MG capsule TAKE ONE CAPSULE BY MOUTH ONCE DAILY WITH FOOD TO  MINIMIZE  GI  SIDE  EFFECTS  . lovastatin (MEVACOR) 20 MG tablet Take 20 mg by mouth at bedtime.  . metoprolol tartrate (LOPRESSOR) 25 MG tablet Take 1 tablet (25 mg total) by mouth 2 (two) times daily.  Marland Kitchen omeprazole (PRILOSEC) 20 MG capsule Take 20 mg by mouth daily.  . predniSONE (DELTASONE) 10 MG tablet  Take 4 tablets (40 mg) daily for 2 days, then, Take 3 tablets (30 mg) daily for 2 days, then, Take 2 tablets (20 mg) daily for 2 days, then, Take 1 tablets (10 mg) daily for 1 days, then stop  . valsartan (DIOVAN) 80 MG tablet TAKE ONE TABLET BY MOUTH ONCE DAILY  . [DISCONTINUED] apixaban (ELIQUIS) 5 MG TABS tablet Take 2 tablets (10 mg total) by mouth 2 (two) times daily. 10 mg, Oral, 2 times daily, First dose on Tue 07/21/15 at 1515, For 14 doses  . [DISCONTINUED] levofloxacin (LEVAQUIN) 750 MG tablet Take 1 tablet (750 mg total) by mouth daily.   No facility-administered encounter medications on file as of 07/28/2015.

## 2015-07-28 NOTE — Patient Instructions (Addendum)
GERD (REFLUX)  is an extremely common cause of respiratory symptoms just like yours , many times with no obvious heartburn at all.    It can be treated with medication, but also with lifestyle changes including elevation of the head of your bed (ideally with 6 inch  bed blocks),  Smoking cessation, avoidance of late meals, excessive alcohol, and avoid fatty foods, chocolate, peppermint, colas, red wine, and acidic juices such as orange juice.  NO MINT OR MENTHOL PRODUCTS SO NO COUGH DROPS  USE SUGARLESS CANDY INSTEAD (Jolley ranchers or Stover's or Life Savers) or even ice chips will also do - the key is to swallow to prevent all throat clearing. NO OIL BASED VITAMINS - use powdered substitutes.    02 2lpm 24/7:   Brovana/budesonide automatically twice daily   Only use your albuterol as a rescue medication to be used if you can't catch your breath by resting or doing a relaxed purse lip breathing pattern.  - The less you use it, the better it will work when you need it. - Ok to use up to 2 puffs  every 4 hours if you must but call for immediate appointment if use goes up over your usual need - Don't leave home without it !!  (think of it like the spare tire for your car)   Follow up here is as needed -  If you need to return for your breathing you will need to bring all your medications and inhalers and solutions with you to verify what you take is exactly what we have you listed as taking before we consider changing any of your meds

## 2015-07-29 ENCOUNTER — Ambulatory Visit (INDEPENDENT_AMBULATORY_CARE_PROVIDER_SITE_OTHER): Payer: Medicare Other | Admitting: Surgery

## 2015-07-29 ENCOUNTER — Other Ambulatory Visit: Payer: Self-pay

## 2015-07-29 ENCOUNTER — Telehealth: Payer: Self-pay | Admitting: Cardiology

## 2015-07-29 ENCOUNTER — Encounter: Payer: Self-pay | Admitting: Surgery

## 2015-07-29 VITALS — BP 121/89 | HR 94 | Temp 97.0°F | Resp 20 | Ht 72.0 in | Wt 226.3 lb

## 2015-07-29 DIAGNOSIS — I724 Aneurysm of artery of lower extremity: Secondary | ICD-10-CM

## 2015-07-29 DIAGNOSIS — I255 Ischemic cardiomyopathy: Secondary | ICD-10-CM

## 2015-07-29 NOTE — Telephone Encounter (Signed)
Received a call from Gaffney with [facility].She stated patient is scheduled to have right popliteal artery stent 08/04/15 by Dr.Brabham 08/04/15.She needs a ok from Dr.Jordan to hold eliquis 3 days before procedure.Advised Dr.Jordan out of office this week.Patient's last appointment 08/01/13.Appointment scheduled with Truitt Merle NP 07/30/15 at 1:30 pm at [facility] office.Patient was called and notified.Fax ok to hold eliquis to [facility] at fax # [phone].

## 2015-07-29 NOTE — Progress Notes (Signed)
Patient name: Jesus Sanders MRN: [id number] DOB: 10-27-1936 Sex: male     Chief Complaint  Patient presents with  . Follow-up    1 week FU   post hospitalization      HISTORY OF PRESENT ILLNESS: This is a 79 year old male who was just discharged from the hospital for SOB.  Just prior to discharge, a venous doppler was ordered.  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  This was confirmed with CTA.  He is here today to discuss treatment options.  The patient suffers from COPD secondary to tobacco abuse.  He is on continuous home O2.  He has an ischemic cardiomyopathy and has a single lead pacemaker.  Her takes Eliquis.  He is s/p MI and has undergone PCI.  He suffers from hypercholesterolemia and takes a statin.  His HTN is managed medically  Past Medical History  Diagnosis Date  . Polycythemia vera(238.4)     a. Used to receive chronic phlebotomies until 2007, at [facility] [place].  will restart his phlebotomies from about Mar 15 2011  . Stomach ulcer   . Tobacco abuse     a. cigars  . Duodenal perforation June 2012  . Peritonitis June 2012  . Pneumonia April 2012  . Systolic CHF, chronic     a. 12/2011 Echo: EF 25-30%, mid-dist antsept/inf, apical AK, Gr 1 DD, Triv AI, Mild MR.  . Ischemic cardiomyopathy     a. 01/2012 S/P MDT Protecta single lead ICD, ser # [id number]  . History of DVT (deep vein thrombosis)   . Chronic bronchitis   . GERD (gastroesophageal reflux disease)   . Myocardial infarction   . Shortness of breath   . COPD (chronic obstructive pulmonary disease)   . Hypertension   . CAD (coronary artery disease)     a. 07/2011 Anterior apical STEMI/Cath/PCI: LM nl, LAD 100p/m (2.5 x 60m Mini-Vision BMS), LCX 40p, RCA dominant, nl, EF 30%.    Past Surgical History  Procedure Laterality Date  . Cholecystectomy  03/2011    Dr. TMarlou Sanders . Colon surgery    . Cardiac catheterization  Sept 2012    Normal left main, occluded LAD, 40% LCX and  normal RCA. EF is 30%  . UKoreaechocardiography  Sept 2012    EF 25 to 30% with akinesis of the mid to distal anterior and apical myocardium, trivial AI and no apical thrombus  . Cardiac defibrillator placement      single chamber  . Shoulder arthroscopy      left, rotatotor cuff tendinopathy  . Implantable cardioverter defibrillator implant N/A 01/11/2012    Procedure: IMPLANTABLE CARDIOVERTER DEFIBRILLATOR IMPLANT;  Surgeon: Jesus Sprang MD;  [facility];  Service: Cardiovascular;  Laterality: N/A;    Social History   Social History  . Marital Status: Widowed    Spouse Name: N/A  . Number of Children: N/A  . Years of Education: N/A   Occupational History  . Not on file.   Social History Main Topics  . Smoking status: Former Smoker -- 0.50 packs/day for 60 years    Types: Cigars    Quit date: 05/18/2013  . Smokeless tobacco: Current User    Types: Chew  . Alcohol Use: Yes     Comment: rarely   . Drug Use: No  . Sexual Activity: No   Other Topics Concern  . Not on file   Social History Narrative  Lives with his granddaughter at home.    Daughter lives next door and is his healthcare power of attorney   Experiences dyspnea with minimal activity - sedentary.  Wife died about 02/04/1999 and   Retired Airline pilot.    Family History  Problem Relation Age of Onset  . Lung disease Father     Jesus Sanders- worked at a [employer] as of 07/29/2015  . (No Known Allergies)    Current Outpatient Prescriptions on File Prior to Visit  Medication Sig Dispense Refill  . amLODipine (NORVASC) 10 MG tablet Take 1 tablet (10 mg total) by mouth daily. 30 tablet 0  . apixaban (ELIQUIS) 5 MG TABS tablet Take 1 tablet (5 mg total) by mouth 2 (two) times daily. First dose on Tue 07/28/15 at 1000 120 tablet 0  . arformoterol (BROVANA) 15 MCG/2ML NEBU Take 2 mLs (15 mcg total) by nebulization 2 (two) times daily. Dx 491.9 120 mL 6  . budesonide (PULMICORT) 0.25 MG/2ML  nebulizer solution Take 2 mLs (0.25 mg total) by nebulization 2 (two) times daily. Dx 491.9 120 mL 6  . dextromethorphan-guaiFENesin (MUCINEX DM) 30-600 MG per 12 hr tablet Take 1 tablet by mouth 2 (two) times daily as needed for cough.    . furosemide (LASIX) 40 MG tablet Take 40 mg by mouth daily.     . hydroxyurea (HYDREA) 500 MG capsule TAKE ONE CAPSULE BY MOUTH ONCE DAILY WITH FOOD TO  MINIMIZE  GI  SIDE  EFFECTS 30 capsule 0  . lovastatin (MEVACOR) 20 MG tablet Take 20 mg by mouth at bedtime.    . metoprolol tartrate (LOPRESSOR) 25 MG tablet Take 1 tablet (25 mg total) by mouth 2 (two) times daily. 60 tablet 0  . omeprazole (PRILOSEC) 20 MG capsule Take 20 mg by mouth daily.    . predniSONE (DELTASONE) 10 MG tablet Take 4 tablets (40 mg) daily for 2 days, then, Take 3 tablets (30 mg) daily for 2 days, then, Take 2 tablets (20 mg) daily for 2 days, then, Take 1 tablets (10 mg) daily for 1 days, then stop 19 tablet 0  . valsartan (DIOVAN) 80 MG tablet TAKE ONE TABLET BY MOUTH ONCE DAILY 30 tablet 0  . albuterol (PROVENTIL HFA;VENTOLIN HFA) 108 (90 BASE) MCG/ACT inhaler Inhale 2 puffs into the lungs every 6 (six) hours as needed for wheezing or shortness of breath. (Patient not taking: Reported on 07/29/2015) 18 g 6   No current facility-administered medications on file prior to visit.     REVIEW OF SYSTEMS: Cardiovascular: No chest pain, chest pressure, palpitations, Positive SOB Pulmonary:positive wheezing, home O2. Neurologic: No weakness, paresthesias, aphasia, or amaurosis. No dizziness. Hematologic: No bleeding problems or clotting disorders. Musculoskeletal: No joint pain or joint swelling. Gastrointestinal: No blood in stool or hematemesis Genitourinary: No dysuria or hematuria. Psychiatric:: No history of major depression. Integumentary: No rashes or ulcers. Constitutional: No fever or chills.  PHYSICAL EXAMINATION:   Vital signs are  Filed Vitals:   07/29/15 1024  BP:  121/89  Pulse: 94  Temp: 97 F (36.1 C)  TempSrc: Oral  Resp: 20  Height: 6' (1.829 m)  Weight: 226 lb 4.8 oz (102.649 kg)  SpO2: 94%   Body mass index is 30.69 kg/(m^2). General: The patient appears their stated age. HEENT:  No gross abnormalities Pulmonary:  Non labored breathing Abdomen: Soft and non-tender Musculoskeletal: There are no major deformities. Neurologic: No focal weakness or paresthesias are detected, Skin:  There are no ulcer or rashes noted. Psychiatric: The patient has normal affect. Cardiovascular: There is a regular rate and rhythm without significant murmur appreciated.   Diagnostic Studies I have reviewed his CTA with the following findings: 1. Aneurysmal dilatation of the descending thoracic aorta measuring 3.9 cm in greatest diameter. 2. Mild aneurysmal dilatation of the distal abdominal aorta measuring 3.2 x 3.4 cm. 3. Mild aneurysmal disease of the proximal right internal iliac artery measuring 11 mm. 4. Prominent and elongated right popliteal artery aneurysm measuring 3.6 x 4.1 cm in greatest transverse diameter. This aneurysm is largely thrombosed. Patent flow is present through the aneurysmal segment. 5. Occlusion of the proximal right posterior tibial artery. Two-vessel runoff is present on the right below the knee. 6. Aneurysmal disease of the proximal left internal iliac artery measuring 14 mm. 7. Mild dilatation of the proximal left popliteal artery just above the knee measuring 11 mm. Three-vessel runoff is present below the knee on the left. 8. More prominent laminated mural thrombus in the posterior and inferior aspect of the right pulmonary artery just extending into the right lower lobe pulmonary artery. This does not have the appearance of acute pulmonary embolism but clearly has increased in thickness and prominence compared to the prior CTA of the chest in April, 2015. Consider anticoagulation therapy for this  finding.   Assessment: Right Popliteal aneurysm Plan: I discussed that because of the large size of his right popliteal artery aneurysm, he is at risk for complications, namely rupture or embolization.  After reviewing his CTA, I feel he is a candidate for percutaneous treatment with a covered stent.  He will need to be off his Eliquis for 3 days prior, which I will clear with cardiology to make sure he does not require a Lovenox bridge.  I discussed the risks and benefits including embolization, possible surgical intervention, and stent failure.  He is willing to proceed.  This is scheduled for Tuesday September 27  V. Leia Alf, M.D. [facility] Office: [phone] Pager:  [phone]

## 2015-07-30 ENCOUNTER — Encounter: Payer: Self-pay | Admitting: Physician Assistant

## 2015-07-30 ENCOUNTER — Ambulatory Visit: Payer: Medicare Other | Admitting: Nurse Practitioner

## 2015-07-30 NOTE — Progress Notes (Signed)
Cardiology Office Note Date:  07/31/2015  Patient ID:  Jesus Sanders, Jesus Sanders 1936-10-21, MRN [id number] PCP:  Leonard Downing, MD  Cardiologist:  Dr. Caryl Comes for EP and remotely saw Dr. Martinique   Chief Complaint: vascular would like to know if he can come off Eliquis for recent diagnosis of PE  History of Present Illness: Jesus Sanders is a 79 y.o. male with history of severe COPD with chronic respiratory failure on home O2, recently diagnosed chronic appearing but enlarging PE in 07/2015, CAD (STEMI 2012 s/p BMS to LAD), ICM s/p [employer] ICD 2013 (last echo in 2013 showed EF 25-30%), microcytic anemia, h/o DVT, GERD, HTN, prior h/o polycythema vera who presents for cardiac evaluation.  He was recently admitted earlier this month for acute on chronic hypoxic respiratory failure secondary to COPD exacerbation. During admission he was found to have a popliteal aneurysm, descending thoracic aneurysm, and AAA as well as chronic but enlarging PE. He was started on Eliquis by internal medicine.  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 He was seen by Dr. Trula Slade who recommends to proceed with percutaneous treatment of his right popliteal artery aneurysm. Per Dr. Stephens Shire note, he would like the patient to hold Eliquis for 3 days prior. The patient is here to discuss clearance for such.   The patient denies any chest pain. Breathing is back to  baseline. No recent ICD discharges. Denies bleeding. Overall he has no complaints today. He is here with one of his granddaughters - she will be communicating any recommendations to his other granddaughter who looks after his care.    Past Medical History  Diagnosis Date  . Polycythemia vera(238.4)     a. Used to receive chronic phlebotomies until 2007, at regional [place].  will restart his phlebotomies from about Mar 15 2011  . Stomach ulcer   . Tobacco abuse     a. cigars  . Duodenal perforation June 2012  . Peritonitis June 2012  . Pneumonia April 2012  . Systolic CHF, chronic     a. 12/2011 Echo: EF 25-30%, mid-dist antsept/inf, apical AK, Gr 1 DD, Triv AI, Mild MR.  . Ischemic cardiomyopathy     a. 01/2012 S/P MDT Protecta single lead ICD, ser # [id number]  . History of DVT (deep vein thrombosis)   . Chronic bronchitis   . GERD (gastroesophageal reflux disease)   . Myocardial infarction   . Shortness of breath   . COPD (chronic obstructive pulmonary disease)   . Hypertension   . CAD (coronary artery disease)     a. 07/2011 Anterior apical STEMI/Cath/PCI: LM nl, LAD 100p/m (2.5 x 38m Mini-Vision BMS), LCX 40p, RCA dominant, nl, EF 30%.  . Pulmonary embolism     a. Diagnosed 07/2015 - placed on Eliquis.  . Chronic respiratory failure   . Popliteal aneurysm   . Dilatation of thoracic aorta     a. CT 07/2015 - aneurysmal dilatation of the descending thoracic aorta measuring 3.9 cm in greatest diameter.  .Jesus Sanders  AAA (abdominal aortic aneurysm)     a. CT 07/2015 - mild aneurysmal dilatation of the distal abdominal aorta measuring 3.2 x 3.4 cm.    Past Surgical History  Procedure Laterality Date  . Cholecystectomy  03/2011    Dr. Marlou Starks  . Colon surgery    . Cardiac catheterization  Sept 2012    Normal left main, occluded LAD, 40% LCX and normal RCA. EF is 30%  . US echocardiography  Sept 2012    EF 25 to 30% with akinesis of the mid to distal anterior and apical myocardium,  trivial AI and no apical thrombus  . Cardiac defibrillator placement      single chamber  . Shoulder arthroscopy      left, rotatotor cuff tendinopathy  . Implantable cardioverter defibrillator implant N/A 01/11/2012    Procedure: IMPLANTABLE CARDIOVERTER DEFIBRILLATOR IMPLANT;  Surgeon: Deboraha Sprang, MD;  Location: [facility] CATH LAB;  Service: Cardiovascular;  Laterality: N/A;    Current Outpatient Prescriptions  Medication Sig Dispense Refill  . albuterol (PROVENTIL HFA;VENTOLIN HFA) 108 (90 BASE) MCG/ACT inhaler Inhale 2 puffs into the lungs every 6 (six) hours as needed for wheezing or shortness of breath. 18 g 6  . amLODipine (NORVASC) 10 MG tablet Take 1 tablet (10 mg total) by mouth daily. 30 tablet 0  . apixaban (ELIQUIS) 5 MG TABS tablet Take 1 tablet (5 mg total) by mouth 2 (two) times daily. First dose on Tue 07/28/15 at 1000 120 tablet 0  . arformoterol (BROVANA) 15 MCG/2ML NEBU Take 2 mLs (15 mcg total) by nebulization 2 (two) times daily. Dx 491.9 120 mL 6  . budesonide (PULMICORT) 0.25 MG/2ML nebulizer solution Take 2 mLs (0.25 mg total) by nebulization 2 (two) times daily. Dx 491.9 120 mL 6  . dextromethorphan-guaiFENesin (MUCINEX DM) 30-600 MG per 12 hr tablet Take 1 tablet by mouth 2 (two) times daily as needed for cough.    . furosemide (LASIX) 40 MG tablet Take 40 mg by mouth daily.     . hydroxyurea (HYDREA) 500 MG capsule TAKE ONE CAPSULE BY MOUTH ONCE DAILY WITH FOOD TO  MINIMIZE  GI  SIDE  EFFECTS 30 capsule 0  . lovastatin (MEVACOR) 20 MG tablet Take 20 mg by mouth at bedtime.    . metoprolol tartrate (LOPRESSOR) 25 MG tablet Take 1 tablet (25 mg total) by mouth 2 (two) times daily. 60 tablet 0  . omeprazole (PRILOSEC) 20 MG capsule Take 20 mg by mouth daily.    . predniSONE (DELTASONE) 10 MG tablet Take 4 tablets (40 mg) daily for 2 days, then, Take 3 tablets (30 mg) daily for 2 days, then, Take 2 tablets (20 mg) daily for 2 days, then, Take 1 tablets (10 mg) daily for 1  days, then stop 19 tablet 0  . valsartan (DIOVAN) 80 MG tablet TAKE ONE TABLET BY MOUTH ONCE DAILY 30 tablet 0   No current facility-administered medications for this visit.    Allergies:   Review of patient's allergies indicates no known allergies.   Social History:  The patient  reports that he quit smoking about 2 years ago. His smoking use included Cigars. His smokeless tobacco use includes Chew. He reports that he drinks alcohol. He reports that he does not use illicit drugs.   Family History:  The patient's family history includes Lung disease in his father.  ROS:  Please see the history of present illness. + Chronic DOE.  All other systems are reviewed and otherwise  negative.   PHYSICAL EXAM: VS:  BP 118/72 mmHg  Pulse 99  Ht 6' (1.829 m)  Wt 228 lb 9.6 oz (103.692 kg)  BMI 31.00 kg/m2  SpO2 94% BMI: Body mass index is 31 kg/(m^2). Well nourished chronically ill appearing WM in no acute distress HEENT: normocephalic, atraumatic Neck: no JVD, carotid bruits or masses Cardiac:  normal S1, S2; RRR; no murmurs, rubs, or gallops Lungs:  Diffusely diminished with no wheezing, rhonchi or rales Abd: soft, nontender, no hepatomegaly, + BS MS: no deformity or atrophy Ext: no edema, chronic stasis changes Skin: warm and dry, no rash Neuro:  moves all extremities spontaneously, no focal abnormalities noted, follows commands Psych: euthymic mood, full affect   EKG:  Done today shows NSR 99bpm, left anterior fascicular block, anterolateral infarct age undetermined with poor R wave progression.  Recent Labs: 08/18/2014: Pro B Natriuretic peptide (BNP) 308.9 07/19/2015: ALT 11*; B Natriuretic Peptide 52.3 07/20/2015: BUN 28*; Creatinine, Ser 1.43*; Hemoglobin 15.5; Platelets 136*; Potassium 4.6; Sodium 136  No results found for requested labs within last 365 days.   Estimated Creatinine Clearance: 53.8 mL/min (by C-G formula based on Cr of 1.43).   Wt Readings from Last 3  Encounters:  07/31/15 228 lb 9.6 oz (103.692 kg)  07/29/15 226 lb 4.8 oz (102.649 kg)  07/28/15 225 lb 9.6 oz (102.331 kg)     Other studies reviewed: Additional studies/records reviewed today include: summarized above  ASSESSMENT AND PLAN:  1. PVD including popliteal artery aneurysm - we are asked to see the patient to clear him to come off Eliquis and decide regarding bridging. He is not on Eliquis for a cardiac reason - this was not prescribed by cardiology. He was recently placed on this by internal medicine due to a pulmonary embolism. I discussed the comprehensive details of the case with Dr. Ron Parker as well as our [facility] pharmacist. Given the diagnosis of PE it is of our recommendation to limit the amount of time he will be off this medication. Given that it is a NOAC, bridging is not necessary - the pharmacist has recommended that he remain off this for 24 hours prior to procedure. This would mean his last dose (before his 9/27 morning procedure) would be the evening of 9/25. We would recommend to resume it as soon as safe by vascular, preferably no more than 24 hours later. I called [facility] to review our recommendations with Dr. Trula Slade and was told he is out of the office. I spoke with one of his nurses who graciously offered to help share our recommendations with one of Dr. Stephens Shire partners to get their final input on timing of last dose of Eliquis (since Dr. Trula Slade had specifically mentioned 3 days and possible bridge in his note). She said their office will call the patient with the final recommendation. I told the patient's granddaughter to call their office tomorrow AM if she has not yet heard back as they will require an answer of what to do by tomorrow. 2. Chronic pulmonary embolism, recently diagnosed - see above. 3. CAD as above - no recent angina. Not on ASA due to now being on Eliquis. Continue statin and metoprolol. 4. Ischemic cardiomyopathy - volume status appears stable. He  is on metoprolol and ARB. Will defer further surveillance echocardiograms to primary cardiologist. 5. Essential HTN - controlled in clinic today.   Disposition: F/u with Dr. Martinique in 4 months.  Current medicines are reviewed at length with the patient today.  The patient did not have any concerns regarding medicines.  Raechel Ache PA-C 07/31/2015 10:22 AM     [facility] [address] [place] [address] [phone] (office)  [phone] (fax)

## 2015-07-31 ENCOUNTER — Encounter: Payer: Self-pay | Admitting: Physician Assistant

## 2015-07-31 ENCOUNTER — Ambulatory Visit (INDEPENDENT_AMBULATORY_CARE_PROVIDER_SITE_OTHER): Payer: Medicare Other | Admitting: Physician Assistant

## 2015-07-31 VITALS — BP 118/72 | HR 99 | Ht 72.0 in | Wt 228.6 lb

## 2015-07-31 DIAGNOSIS — I255 Ischemic cardiomyopathy: Secondary | ICD-10-CM | POA: Diagnosis not present

## 2015-07-31 DIAGNOSIS — I724 Aneurysm of artery of lower extremity: Secondary | ICD-10-CM | POA: Diagnosis not present

## 2015-07-31 DIAGNOSIS — I10 Essential (primary) hypertension: Secondary | ICD-10-CM

## 2015-07-31 DIAGNOSIS — Z9861 Coronary angioplasty status: Secondary | ICD-10-CM

## 2015-07-31 DIAGNOSIS — I2782 Chronic pulmonary embolism: Secondary | ICD-10-CM

## 2015-07-31 DIAGNOSIS — I2510 Atherosclerotic heart disease of native coronary artery without angina pectoris: Secondary | ICD-10-CM | POA: Diagnosis not present

## 2015-07-31 NOTE — Patient Instructions (Signed)
Medication Instructions:  Your physician recommends that you continue on your current medications as directed. Please refer to the Current Medication list given to you today.   Labwork: None ordered  Testing/Procedures: None ordered  Follow-Up: Your physician recommends that you schedule a follow-up appointment in: 4 MONTHS WITH DR. Martinique   Any Other Special Instructions Will Be Listed Below (If Applicable). Dr. Stephens Shire office will contact you later today with the instructions on the blood thinner.  If you haven't heard by TOMORROW MORNING [facility]) please call Dr. Kathleen Lime office and speak to the on call MD.

## 2015-08-03 ENCOUNTER — Telehealth: Payer: Self-pay | Admitting: Cardiology

## 2015-08-03 NOTE — Assessment & Plan Note (Signed)
-  ambulatory desats 87% on RA >O2 w/ act 06/02/2014  -ONO 06/02/2014 >> never completed  - Sat on arrival RA 02/17/2015 = 88% - 02/17/2015  Walked 2lpm x 1 laps = 185 ft   stopped due to sob/ nl pace/ no desat    rec 02 2lpm 24/7 as of  07/28/2015

## 2015-08-03 NOTE — Assessment & Plan Note (Addendum)
PFT's 04/03/14 FEV1  0.90 (27%) ratio 38 and DLCO 19% corrects to 30   F/v physiologic - spirometry 05/17/14  FEV1  0.91 (26%) ratio 42 but f/v non physiologic -  Med calendar 06/23/2014 > not using as of 02/17/15   So he has very severe copd at baseline and best option is laba/ICS neb and prn saba at this point using the KIS principle  I had an extended discussion with the patient reviewing all relevant studies completed to date and  lasting 15 to 20 minutes of a 25 minute visit    I offered to see him back if he'll meet me half way and bring all active meds with him but s this vital step there's little else to offer here and he appears to be approaching nhp if slips much further functionally.   Each maintenance medication was reviewed in detail including most importantly the difference between maintenance and prns and under what circumstances the prns are to be triggered using an action plan format that is not reflected in the computer generated alphabetically organized AVS.    Please see instructions for details which were reviewed in writing and the patient given a copy highlighting the part that I personally wrote and discussed at today's ov.

## 2015-08-04 ENCOUNTER — Other Ambulatory Visit: Payer: Self-pay | Admitting: Internal Medicine

## 2015-08-04 LAB — CUP PACEART REMOTE DEVICE CHECK
Brady Statistic RV Percent Paced: 0.09 %
HIGH POWER IMPEDANCE MEASURED VALUE: 83 Ohm
HighPow Impedance: 589 Ohm
Lead Channel Impedance Value: 684 Ohm
Lead Channel Pacing Threshold Pulse Width: 0.4 ms
Lead Channel Sensing Intrinsic Amplitude: 5.375 mV
Lead Channel Setting Pacing Pulse Width: 0.4 ms
Lead Channel Setting Sensing Sensitivity: 0.3 mV
MDC IDC MSMT BATTERY VOLTAGE: 3.07 V
MDC IDC MSMT LEADCHNL RV PACING THRESHOLD AMPLITUDE: 0.5 V
MDC IDC SESS DTM: 20160919052410
MDC IDC SET LEADCHNL RV PACING AMPLITUDE: 2.5 V
MDC IDC SET ZONE DETECTION INTERVAL: 360 ms
MDC IDC SET ZONE DETECTION INTERVAL: 360 ms
Zone Setting Detection Interval: 320 ms

## 2015-08-05 ENCOUNTER — Ambulatory Visit (HOSPITAL_COMMUNITY)
Admission: RE | Admit: 2015-08-05 | Discharge: 2015-08-05 | Disposition: A | Discharge status: Home / Self Care | Payer: Medicare Other | Source: Ambulatory Visit | Attending: Surgery | Admitting: Surgery

## 2015-08-05 ENCOUNTER — Encounter (HOSPITAL_COMMUNITY): Payer: Self-pay | Admitting: Surgery

## 2015-08-05 ENCOUNTER — Encounter (HOSPITAL_COMMUNITY)
Admission: RE | Disposition: A | Discharge status: Home / Self Care | Payer: Self-pay | Source: Ambulatory Visit | Attending: Surgery

## 2015-08-05 ENCOUNTER — Other Ambulatory Visit: Payer: Self-pay

## 2015-08-05 DIAGNOSIS — Z9981 Dependence on supplemental oxygen: Secondary | ICD-10-CM | POA: Insufficient documentation

## 2015-08-05 DIAGNOSIS — Z950 Presence of cardiac pacemaker: Secondary | ICD-10-CM | POA: Diagnosis not present

## 2015-08-05 DIAGNOSIS — I252 Old myocardial infarction: Secondary | ICD-10-CM | POA: Insufficient documentation

## 2015-08-05 DIAGNOSIS — I2510 Atherosclerotic heart disease of native coronary artery without angina pectoris: Secondary | ICD-10-CM | POA: Insufficient documentation

## 2015-08-05 DIAGNOSIS — I10 Essential (primary) hypertension: Secondary | ICD-10-CM | POA: Diagnosis not present

## 2015-08-05 DIAGNOSIS — F17220 Nicotine dependence, chewing tobacco, uncomplicated: Secondary | ICD-10-CM | POA: Insufficient documentation

## 2015-08-05 DIAGNOSIS — J449 Chronic obstructive pulmonary disease, unspecified: Secondary | ICD-10-CM | POA: Diagnosis not present

## 2015-08-05 DIAGNOSIS — Z7901 Long term (current) use of anticoagulants: Secondary | ICD-10-CM | POA: Insufficient documentation

## 2015-08-05 DIAGNOSIS — I714 Abdominal aortic aneurysm, without rupture, unspecified: Secondary | ICD-10-CM

## 2015-08-05 DIAGNOSIS — Z95828 Presence of other vascular implants and grafts: Secondary | ICD-10-CM

## 2015-08-05 DIAGNOSIS — E780 Pure hypercholesterolemia: Secondary | ICD-10-CM | POA: Diagnosis not present

## 2015-08-05 DIAGNOSIS — K219 Gastro-esophageal reflux disease without esophagitis: Secondary | ICD-10-CM | POA: Insufficient documentation

## 2015-08-05 DIAGNOSIS — I724 Aneurysm of artery of lower extremity: Secondary | ICD-10-CM | POA: Insufficient documentation

## 2015-08-05 DIAGNOSIS — Z86718 Personal history of other venous thrombosis and embolism: Secondary | ICD-10-CM | POA: Diagnosis not present

## 2015-08-05 DIAGNOSIS — I255 Ischemic cardiomyopathy: Secondary | ICD-10-CM | POA: Diagnosis not present

## 2015-08-05 DIAGNOSIS — D45 Polycythemia vera: Secondary | ICD-10-CM | POA: Diagnosis not present

## 2015-08-05 DIAGNOSIS — Z48812 Encounter for surgical aftercare following surgery on the circulatory system: Secondary | ICD-10-CM

## 2015-08-05 DIAGNOSIS — I5022 Chronic systolic (congestive) heart failure: Secondary | ICD-10-CM | POA: Diagnosis not present

## 2015-08-05 HISTORY — PX: PERIPHERAL VASCULAR CATHETERIZATION: SHX172C

## 2015-08-05 LAB — POCT I-STAT, CHEM 8
BUN: 27 mg/dL — AB (ref 6–20)
CALCIUM ION: 1.25 mmol/L (ref 1.13–1.30)
Chloride: 105 mmol/L (ref 101–111)
Creatinine, Ser: 1.3 mg/dL — ABNORMAL HIGH (ref 0.61–1.24)
Glucose, Bld: 96 mg/dL (ref 65–99)
HEMATOCRIT: 51 % (ref 39.0–52.0)
Hemoglobin: 17.3 g/dL — ABNORMAL HIGH (ref 13.0–17.0)
Potassium: 4 mmol/L (ref 3.5–5.1)
SODIUM: 141 mmol/L (ref 135–145)
TCO2: 24 mmol/L (ref 0–100)

## 2015-08-05 LAB — POCT ACTIVATED CLOTTING TIME: Activated Clotting Time: 220 seconds

## 2015-08-05 SURGERY — LOWER EXTREMITY ANGIOGRAPHY

## 2015-08-05 MED ORDER — MIDAZOLAM HCL 2 MG/2ML IJ SOLN
INTRAMUSCULAR | Status: DC | PRN
  Administered 2015-08-05 (×2): 1 mg via INTRAVENOUS

## 2015-08-05 MED ORDER — OXYCODONE HCL 5 MG PO TABS: 5.0000 mg | ORAL_TABLET | ORAL | Status: DC | PRN

## 2015-08-05 MED ORDER — MORPHINE SULFATE (PF) 10 MG/ML IV SOLN: 2.0000 mg | INTRAVENOUS | Status: DC | PRN

## 2015-08-05 MED ORDER — FENTANYL CITRATE (PF) 100 MCG/2ML IJ SOLN
INTRAMUSCULAR | Status: DC | PRN
  Administered 2015-08-05 (×3): 25 ug via INTRAVENOUS

## 2015-08-05 MED ORDER — HEPARIN SODIUM (PORCINE) 1000 UNIT/ML IJ SOLN
INTRAMUSCULAR | Status: DC | PRN
  Administered 2015-08-05: 10000 [IU] via INTRAVENOUS

## 2015-08-05 MED ORDER — MIDAZOLAM HCL 2 MG/2ML IJ SOLN
INTRAMUSCULAR | Status: AC
  Filled 2015-08-05: qty 4

## 2015-08-05 MED ORDER — FENTANYL CITRATE (PF) 100 MCG/2ML IJ SOLN
INTRAMUSCULAR | Status: AC
  Filled 2015-08-05: qty 4

## 2015-08-05 MED ORDER — METOPROLOL TARTRATE 1 MG/ML IV SOLN: 2.0000 mg | INTRAVENOUS | Status: DC | PRN

## 2015-08-05 MED ORDER — ALUM & MAG HYDROXIDE-SIMETH 200-200-20 MG/5ML PO SUSP
15.0000 mL | ORAL | Status: DC | PRN
  Filled 2015-08-05: qty 30

## 2015-08-05 MED ORDER — HEPARIN (PORCINE) IN NACL 2-0.9 UNIT/ML-% IJ SOLN
INTRAMUSCULAR | Status: AC
  Filled 2015-08-05: qty 1000

## 2015-08-05 MED ORDER — SODIUM CHLORIDE 0.9 % IV SOLN
INTRAVENOUS | Status: DC
  Administered 2015-08-05: 1000 mL via INTRAVENOUS

## 2015-08-05 MED ORDER — DOCUSATE SODIUM 100 MG PO CAPS: 100.0000 mg | ORAL_CAPSULE | Freq: Every day | ORAL | Status: DC

## 2015-08-05 MED ORDER — LIDOCAINE HCL (PF) 1 % IJ SOLN
INTRAMUSCULAR | Status: DC | PRN
  Administered 2015-08-05: 12 mL

## 2015-08-05 MED ORDER — SODIUM CHLORIDE 0.9 % IV SOLN: 1.0000 mL/kg/h | INTRAVENOUS | Status: DC

## 2015-08-05 MED ORDER — HYDRALAZINE HCL 20 MG/ML IJ SOLN: 5.0000 mg | INTRAMUSCULAR | Status: DC | PRN

## 2015-08-05 MED ORDER — PHENOL 1.4 % MT LIQD: 1.0000 | OROMUCOSAL | Status: DC | PRN

## 2015-08-05 MED ORDER — LIDOCAINE HCL (PF) 1 % IJ SOLN
INTRAMUSCULAR | Status: AC
  Filled 2015-08-05: qty 30

## 2015-08-05 MED ORDER — ACETAMINOPHEN 325 MG PO TABS
325.0000 mg | ORAL_TABLET | ORAL | Status: DC | PRN
  Filled 2015-08-05: qty 2

## 2015-08-05 MED ORDER — GUAIFENESIN-DM 100-10 MG/5ML PO SYRP
15.0000 mL | ORAL_SOLUTION | ORAL | Status: DC | PRN
  Filled 2015-08-05: qty 15

## 2015-08-05 MED ORDER — ONDANSETRON HCL 4 MG/2ML IJ SOLN: 4.0000 mg | Freq: Four times a day (QID) | INTRAMUSCULAR | Status: DC | PRN

## 2015-08-05 MED ORDER — LABETALOL HCL 5 MG/ML IV SOLN: 10.0000 mg | INTRAVENOUS | Status: DC | PRN

## 2015-08-05 MED ORDER — ACETAMINOPHEN 325 MG RE SUPP
325.0000 mg | RECTAL | Status: DC | PRN
  Filled 2015-08-05: qty 2

## 2015-08-05 MED ORDER — LIDOCAINE HCL (PF) 1 % IJ SOLN
INTRAMUSCULAR | Status: DC | PRN
  Administered 2015-08-05: 09:00:00

## 2015-08-05 SURGICAL SUPPLY — 22 items
BAG SNAP BAND KOVER 36X36 (MISCELLANEOUS) ×3 IMPLANT
BALLOON POWERFLX PRO 7X100X135 (BALLOONS) ×3 IMPLANT
CATH ANGIO 5F PIGTAIL 65CM (CATHETERS) ×3 IMPLANT
CATH CROSS OVER TEMPO 5F (CATHETERS) ×3 IMPLANT
COVER DOME SNAP 22 D (MISCELLANEOUS) ×3 IMPLANT
COVER PRB 48X5XTLSCP FOLD TPE (BAG) ×1 IMPLANT
COVER PROBE 5X48 (BAG) ×2
DEVICE CLOSURE PERCLS PRGLD 6F (VASCULAR PRODUCTS) ×1 IMPLANT
KIT ENCORE 26 ADVANTAGE (KITS) ×3 IMPLANT
KIT MICROINTRODUCER STIFF 5F (SHEATH) ×3 IMPLANT
KIT PV (KITS) ×3 IMPLANT
PERCLOSE PROGLIDE 6F (VASCULAR PRODUCTS) ×3
SHEATH PINNACLE 5F 10CM (SHEATH) ×3 IMPLANT
SHEATH PINNACLE ST 7F 45CM (SHEATH) ×3 IMPLANT
SHIELD RADPAD SCOOP 12X17 (MISCELLANEOUS) ×3 IMPLANT
STENT VIABAHN 8X150X120 (Permanent Stent) ×2 IMPLANT
STENT VIABAHN 8X15X120 7FR (Permanent Stent) ×1 IMPLANT
SYR MEDRAD MARK V 150ML (SYRINGE) ×3 IMPLANT
TRANSDUCER W/STOPCOCK (MISCELLANEOUS) ×3 IMPLANT
TRAY PV CATH (CUSTOM PROCEDURE TRAY) ×3 IMPLANT
WIRE BENTSON .035X145CM (WIRE) ×3 IMPLANT
WIRE SPARTACORE .014X300CM (WIRE) ×3 IMPLANT

## 2015-08-05 NOTE — Interval H&P Note (Signed)
History and Physical Interval Note:  08/05/2015 8:44 AM  [name]  has presented today for surgery, with the diagnosis of right pop artery anorizum  The various methods of treatment have been discussed with the patient and family. After consideration of risks, benefits and other options for treatment, the patient has consented to  Procedure(s): Lower Extremity Angiography (N/A) as a surgical intervention .  The patient's history has been reviewed, patient examined, no change in status, stable for surgery.  I have reviewed the patient's chart and labs.  Questions were answered to the patient's satisfaction.     Jesus Sanders

## 2015-08-05 NOTE — H&P (View-Only) (Signed)
Patient name: Jesus Sanders MRN: [id number] DOB: [birth year] Sex: male     Chief Complaint  Patient presents with  . Follow-up    1 week FU   post hospitalization      HISTORY OF PRESENT ILLNESS: This is a 79 year old male who was just discharged from the hospital for SOB.  Just prior to discharge, a venous doppler was ordered.  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  This was confirmed with CTA.  He is here today to discuss treatment options.  The patient suffers from COPD secondary to tobacco abuse.  He is on continuous home O2.  He has an ischemic cardiomyopathy and has a single lead pacemaker.  Her takes Eliquis.  He is s/p MI and has undergone PCI.  He suffers from hypercholesterolemia and takes a statin.  His HTN is managed medically  Past Medical History  Diagnosis Date  . Polycythemia vera(238.4)     a. Used to receive chronic phlebotomies until 2007, at regional [place].  will restart his phlebotomies from about Mar 15 2011  . Stomach ulcer   . Tobacco abuse     a. cigars  . Duodenal perforation June 2012  . Peritonitis June 2012  . Pneumonia April 2012  . Systolic CHF, chronic     a. 12/2011 Echo: EF 25-30%, mid-dist antsept/inf, apical AK, Gr 1 DD, Triv AI, Mild MR.  . Ischemic cardiomyopathy     a. 01/2012 S/P MDT Protecta single lead ICD, ser # [id number] H  . History of DVT (deep vein thrombosis)   . Chronic bronchitis   . GERD (gastroesophageal reflux disease)   . Myocardial infarction   . Shortness of breath   . COPD (chronic obstructive pulmonary disease)   . Hypertension   . CAD (coronary artery disease)     a. 07/2011 Anterior apical STEMI/Cath/PCI: LM nl, LAD 100p/m (2.5 x 6m Mini-Vision BMS), LCX 40p, RCA dominant, nl, EF 30%.    Past Surgical History  Procedure Laterality Date  . Cholecystectomy  03/2011    Dr. TMarlou Starks . Colon surgery    . Cardiac catheterization  Sept 2012    Normal left main, occluded LAD, 40% LCX and  normal RCA. EF is 30%  . UKoreaechocardiography  Sept 2012    EF 25 to 30% with akinesis of the mid to distal anterior and apical myocardium, trivial AI and no apical thrombus  . Cardiac defibrillator placement      single chamber  . Shoulder arthroscopy      left, rotatotor cuff tendinopathy  . Implantable cardioverter defibrillator implant N/A 01/11/2012    Procedure: IMPLANTABLE CARDIOVERTER DEFIBRILLATOR IMPLANT;  Surgeon: SDeboraha Sprang MD;  [facility];  Service: Cardiovascular;  Laterality: N/A;    Social History   Social History  . Marital Status: Widowed    Spouse Name: N/A  . Number of Children: N/A  . Years of Education: N/A   Occupational History  . Not on file.   Social History Main Topics  . Smoking status: Former Smoker -- 0.50 packs/day for 60 years    Types: Cigars    Quit date: 05/18/2013  . Smokeless tobacco: Current User    Types: Chew  . Alcohol Use: Yes     Comment: rarely   . Drug Use: No  . Sexual Activity: No   Other Topics Concern  . Not on file   Social History Narrative  Lives with his granddaughter at home.    Daughter lives next door and is his healthcare power of attorney   Experiences dyspnea with minimal activity - sedentary.  Wife died about January 29, 1999 and   Retired Airline pilot.    Family History  Problem Relation Age of Onset  . Lung disease Father     [name]- worked at a [employer] as of 07/29/2015  . (No Known Allergies)    Current Outpatient Prescriptions on File Prior to Visit  Medication Sig Dispense Refill  . amLODipine (NORVASC) 10 MG tablet Take 1 tablet (10 mg total) by mouth daily. 30 tablet 0  . apixaban (ELIQUIS) 5 MG TABS tablet Take 1 tablet (5 mg total) by mouth 2 (two) times daily. First dose on Tue 07/28/15 at 1000 120 tablet 0  . arformoterol (BROVANA) 15 MCG/2ML NEBU Take 2 mLs (15 mcg total) by nebulization 2 (two) times daily. Dx 491.9 120 mL 6  . budesonide (PULMICORT) 0.25 MG/2ML  nebulizer solution Take 2 mLs (0.25 mg total) by nebulization 2 (two) times daily. Dx 491.9 120 mL 6  . dextromethorphan-guaiFENesin (MUCINEX DM) 30-600 MG per 12 hr tablet Take 1 tablet by mouth 2 (two) times daily as needed for cough.    . furosemide (LASIX) 40 MG tablet Take 40 mg by mouth daily.     . hydroxyurea (HYDREA) 500 MG capsule TAKE ONE CAPSULE BY MOUTH ONCE DAILY WITH FOOD TO  MINIMIZE  GI  SIDE  EFFECTS 30 capsule 0  . lovastatin (MEVACOR) 20 MG tablet Take 20 mg by mouth at bedtime.    . metoprolol tartrate (LOPRESSOR) 25 MG tablet Take 1 tablet (25 mg total) by mouth 2 (two) times daily. 60 tablet 0  . omeprazole (PRILOSEC) 20 MG capsule Take 20 mg by mouth daily.    . predniSONE (DELTASONE) 10 MG tablet Take 4 tablets (40 mg) daily for 2 days, then, Take 3 tablets (30 mg) daily for 2 days, then, Take 2 tablets (20 mg) daily for 2 days, then, Take 1 tablets (10 mg) daily for 1 days, then stop 19 tablet 0  . valsartan (DIOVAN) 80 MG tablet TAKE ONE TABLET BY MOUTH ONCE DAILY 30 tablet 0  . albuterol (PROVENTIL HFA;VENTOLIN HFA) 108 (90 BASE) MCG/ACT inhaler Inhale 2 puffs into the lungs every 6 (six) hours as needed for wheezing or shortness of breath. (Patient not taking: Reported on 07/29/2015) 18 g 6   No current facility-administered medications on file prior to visit.     REVIEW OF SYSTEMS: Cardiovascular: No chest pain, chest pressure, palpitations, Positive SOB Pulmonary:positive wheezing, home O2. Neurologic: No weakness, paresthesias, aphasia, or amaurosis. No dizziness. Hematologic: No bleeding problems or clotting disorders. Musculoskeletal: No joint pain or joint swelling. Gastrointestinal: No blood in stool or hematemesis Genitourinary: No dysuria or hematuria. Psychiatric:: No history of major depression. Integumentary: No rashes or ulcers. Constitutional: No fever or chills.  PHYSICAL EXAMINATION:   Vital signs are  Filed Vitals:   07/29/15 1024  BP:  121/89  Pulse: 94  Temp: 97 F (36.1 C)  TempSrc: Oral  Resp: 20  Height: 6' (1.829 m)  Weight: 226 lb 4.8 oz (102.649 kg)  SpO2: 94%   Body mass index is 30.69 kg/(m^2). General: The patient appears their stated age. HEENT:  No gross abnormalities Pulmonary:  Non labored breathing Abdomen: Soft and non-tender Musculoskeletal: There are no major deformities. Neurologic: No focal weakness or paresthesias are detected, Skin:  There are no ulcer or rashes noted. Psychiatric: The patient has normal affect. Cardiovascular: There is a regular rate and rhythm without significant murmur appreciated.   Diagnostic Studies I have reviewed his CTA with the following findings: 1. Aneurysmal dilatation of the descending thoracic aorta measuring 3.9 cm in greatest diameter. 2. Mild aneurysmal dilatation of the distal abdominal aorta measuring 3.2 x 3.4 cm. 3. Mild aneurysmal disease of the proximal right internal iliac artery measuring 11 mm. 4. Prominent and elongated right popliteal artery aneurysm measuring 3.6 x 4.1 cm in greatest transverse diameter. This aneurysm is largely thrombosed. Patent flow is present through the aneurysmal segment. 5. Occlusion of the proximal right posterior tibial artery. Two-vessel runoff is present on the right below the knee. 6. Aneurysmal disease of the proximal left internal iliac artery measuring 14 mm. 7. Mild dilatation of the proximal left popliteal artery just above the knee measuring 11 mm. Three-vessel runoff is present below the knee on the left. 8. More prominent laminated mural thrombus in the posterior and inferior aspect of the right pulmonary artery just extending into the right lower lobe pulmonary artery. This does not have the appearance of acute pulmonary embolism but clearly has increased in thickness and prominence compared to the prior CTA of the chest in April, 2015. Consider anticoagulation therapy for this  finding.   Assessment: Right Popliteal aneurysm Plan: I discussed that because of the large size of his right popliteal artery aneurysm, he is at risk for complications, namely rupture or embolization.  After reviewing his CTA, I feel he is a candidate for percutaneous treatment with a covered stent.  He will need to be off his Eliquis for 3 days prior, which I will clear with cardiology to make sure he does not require a Lovenox bridge.  I discussed the risks and benefits including embolization, possible surgical intervention, and stent failure.  He is willing to proceed.  This is scheduled for Tuesday September 27  V. Leia Alf, M.D. [facility] Office: [phone] Pager:  [phone]

## 2015-08-05 NOTE — Telephone Encounter (Signed)
Dr. Claiborne Billings spoke with Dr. Trula Slade in reference to this patient. This was overhead page call.

## 2015-08-05 NOTE — Op Note (Signed)
Patient name: Jesus Sanders MRN: [id number] DOB: 1936/04/13 Sex: male  08/05/2015 Pre-operative Diagnosis: Right popliteal artery aneurysm Post-operative diagnosis:  Same Surgeon:  Annamarie Major Procedure Performed:  1.  Ultrasound-guided access, left femoral artery  2.  Abdominal aortogram  3.  Bilateral lower extremity runoff  4.  Endovascular repair of right popliteal artery aneurysm using a Viabahn 8 x 1 50  5.  Closure device     Indications:  The patient was found to have a large right popliteal aneurysm, measuring 4.1 cm.  He is a very poor candidate for open repair therefore I elected to place a covered stent.  Risks and benefits were discussed the patient.  Procedure:  The patient was identified in the holding area and taken to room 8.  The patient was then placed supine on the table and prepped and draped in the usual sterile fashion.  A time out was called.  Ultrasound was used to evaluate the left common femoral artery.  It was patent .  A digital ultrasound image was acquired.  A micropuncture needle was used to access the left common femoral artery under ultrasound guidance.  An 018 wire was advanced without resistance and a micropuncture sheath was placed.  The 018 wire was removed and a benson wire was placed.  The micropuncture sheath was exchanged for a 5 french sheath.  An omniflush catheter was advanced over the wire to the level of L-1.  An abdominal angiogram was obtained.  Next, using the omniflush catheter and a benson wire, the aortic bifurcation was crossed and the catheter was placed into theright external iliac artery and right runoff was obtained.  A retrograde injection from the sheath was performed to evaluate the proximal left leg.  Findings:   Aortogram:  No significant renal stenosis is identified.  Aneurysmal changes are noted in the infrarenal abdominal aorta however they cannot be fully defined by angiography.  No significant stenosis is identified  within bilateral common or external iliac arteries.  Right Lower Extremity:  The right common femoral, profundofemoral and superficial femoral artery are widely patent.  Tortuosity is noted within the right popliteal artery behind the patella which corresponds to the location of the aneurysm.  The below knee popliteal artery is widely patent.  There is two-vessel runoff via the anterior tibial peroneal  Left Lower Extremity:  Left common femoral profunda femoral and proximal superficial femoral artery are widely patent  Intervention:  After the above images were acquired, the decision was made to proceed with intervention.  A 7 French sheath was advanced over the bifurcation placed in the right external iliac artery.  The patient was fully heparinized.  Using a 014 wire, the superficial femoral artery was selected.  Wire access was obtained down into the anterior tibial artery.  I selected a Viabahn 8 x 1 50 covered stent.  This was deployed just proximal to the joint space.  It was then molded to confirmation using a 7 mm balloon.  Completion imaging revealed complete exclusion of the aneurysm and no change in the runoff.  The catheters and wires were removed.  The sheath was withdrawn to the left external iliac artery.  I used a pro-glide to close the arteriotomy site.  There was no complications.  Impression:  #1  successful endovascular repair of a right popliteal artery aneurysm using a Viabahn 8 x 1 50 covered stent    V. Annamarie Major, M.D. [facility] [employer] Office: [phone]  Pager:  [phone]

## 2015-08-05 NOTE — Discharge Instructions (Signed)

## 2015-08-06 ENCOUNTER — Telehealth: Payer: Self-pay | Admitting: Surgery

## 2015-08-06 NOTE — Telephone Encounter (Addendum)
-----   Message from Denman George, RN sent at 08/05/2015 12:35 PM EDT ----- Regarding: Zigmund Daniel log;  also needs 3 mo. AAA [name], and Bilat LE Art Dup and OV with VWB   ----- Message -----    From: Serafina Mitchell, MD    Sent: 08/05/2015   9:56 AM      To: [facility] Charge Pool  08/05/2015:  Surgeon:  Annamarie Major Procedure Performed:  1.  Ultrasound-guided access, left femoral artery  2.  Abdominal aortogram  3.  Bilateral lower extremity runoff  4.  Endovascular repair of right popliteal artery aneurysm using a Viabahn 8 x 1 50  5.  Closure device    Schedule follow-up in 3 months with an abdominal ultrasound for his aneurysm and a lower extremity duplex to evaluate the stent in his right popliteal artery and the left popliteal artery aneurysm  notified patient of fu appointments January 5, 8am, 9am, 10am, and then 11-16-15 at 1:30 with dr. Trula Slade

## 2015-08-18 ENCOUNTER — Encounter: Payer: Self-pay | Admitting: Cardiology

## 2015-09-01 ENCOUNTER — Encounter: Payer: Self-pay | Admitting: Cardiology

## 2015-09-01 ENCOUNTER — Encounter: Payer: Self-pay | Admitting: Internal Medicine

## 2015-09-09 ENCOUNTER — Telehealth: Payer: Self-pay | Admitting: Internal Medicine

## 2015-09-09 NOTE — Telephone Encounter (Signed)
Ok x 3 months - be sure he has f/u scheduled before this runs out so we can discuss who will do this going forward as I did not understand on the last ov that this was supposed to be me refilling it indefinitely as that is usually a primary care doctors role

## 2015-09-09 NOTE — Telephone Encounter (Signed)
MW would you be willing to refill this medication? Thanks.

## 2015-09-09 NOTE — Telephone Encounter (Signed)
lmtcb x1 for pt to schedule appointment.

## 2015-09-10 MED ORDER — AMLODIPINE BESYLATE 10 MG PO TABS: 10.0000 mg | ORAL_TABLET | Freq: Every day | ORAL | Status: DC

## 2015-09-10 NOTE — Telephone Encounter (Signed)
Spoke with pt. He is aware that we will refill his medication. OV has been scheduled for 12/09/15 at 1:45pm. Rx has been sent in. Nothing further was needed.

## 2015-10-26 ENCOUNTER — Ambulatory Visit (INDEPENDENT_AMBULATORY_CARE_PROVIDER_SITE_OTHER): Payer: Medicare Other | Admitting: *Deleted

## 2015-10-26 DIAGNOSIS — I5022 Chronic systolic (congestive) heart failure: Secondary | ICD-10-CM | POA: Diagnosis not present

## 2015-10-26 DIAGNOSIS — I255 Ischemic cardiomyopathy: Secondary | ICD-10-CM

## 2015-10-26 NOTE — Progress Notes (Signed)
Remote ICD transmission.   

## 2015-10-27 LAB — CUP PACEART REMOTE DEVICE CHECK
Battery Voltage: 3.1 V
Brady Statistic RV Percent Paced: 0.11 %
Date Time Interrogation Session: 20161219072708
HIGH POWER IMPEDANCE MEASURED VALUE: 570 Ohm
HIGH POWER IMPEDANCE MEASURED VALUE: 83 Ohm
Lead Channel Pacing Threshold Amplitude: 0.5 V
Lead Channel Sensing Intrinsic Amplitude: 5.5 mV
Lead Channel Sensing Intrinsic Amplitude: 5.5 mV
Lead Channel Setting Pacing Pulse Width: 0.4 ms
MDC IDC LEAD IMPLANT DT: 20130306
MDC IDC LEAD LOCATION: 753860
MDC IDC LEAD MODEL: 6935
MDC IDC MSMT LEADCHNL RV IMPEDANCE VALUE: 684 Ohm
MDC IDC MSMT LEADCHNL RV PACING THRESHOLD PULSEWIDTH: 0.4 ms
MDC IDC SET LEADCHNL RV PACING AMPLITUDE: 2.5 V
MDC IDC SET LEADCHNL RV SENSING SENSITIVITY: 0.3 mV

## 2015-10-29 ENCOUNTER — Encounter: Payer: Self-pay | Admitting: Cardiology

## 2015-11-05 ENCOUNTER — Encounter: Payer: Self-pay | Admitting: Surgery

## 2015-11-12 ENCOUNTER — Other Ambulatory Visit (HOSPITAL_COMMUNITY): Payer: Medicare Other

## 2015-11-12 ENCOUNTER — Encounter: Payer: Self-pay | Admitting: Cardiology

## 2015-11-12 ENCOUNTER — Encounter (HOSPITAL_COMMUNITY): Payer: Medicare Other

## 2015-11-16 ENCOUNTER — Ambulatory Visit: Payer: Medicare Other | Admitting: Surgery

## 2015-11-22 ENCOUNTER — Emergency Department (HOSPITAL_COMMUNITY): Payer: Medicare Other

## 2015-11-22 ENCOUNTER — Encounter (HOSPITAL_COMMUNITY): Payer: Self-pay

## 2015-11-22 ENCOUNTER — Emergency Department (HOSPITAL_COMMUNITY)
Admission: EM | Admit: 2015-11-22 | Discharge: 2015-11-22 | Disposition: A | Discharge status: Home / Self Care | Payer: Medicare Other | Attending: Emergency Medicine | Admitting: Emergency Medicine

## 2015-11-22 DIAGNOSIS — H538 Other visual disturbances: Secondary | ICD-10-CM | POA: Diagnosis not present

## 2015-11-22 DIAGNOSIS — Z86711 Personal history of pulmonary embolism: Secondary | ICD-10-CM | POA: Diagnosis not present

## 2015-11-22 DIAGNOSIS — H532 Diplopia: Secondary | ICD-10-CM | POA: Diagnosis not present

## 2015-11-22 DIAGNOSIS — I2510 Atherosclerotic heart disease of native coronary artery without angina pectoris: Secondary | ICD-10-CM | POA: Insufficient documentation

## 2015-11-22 DIAGNOSIS — Z86718 Personal history of other venous thrombosis and embolism: Secondary | ICD-10-CM | POA: Insufficient documentation

## 2015-11-22 DIAGNOSIS — Z87891 Personal history of nicotine dependence: Secondary | ICD-10-CM | POA: Diagnosis not present

## 2015-11-22 DIAGNOSIS — Z79899 Other long term (current) drug therapy: Secondary | ICD-10-CM | POA: Diagnosis not present

## 2015-11-22 DIAGNOSIS — I252 Old myocardial infarction: Secondary | ICD-10-CM | POA: Insufficient documentation

## 2015-11-22 DIAGNOSIS — Z95810 Presence of automatic (implantable) cardiac defibrillator: Secondary | ICD-10-CM | POA: Insufficient documentation

## 2015-11-22 DIAGNOSIS — Z9889 Other specified postprocedural states: Secondary | ICD-10-CM | POA: Diagnosis not present

## 2015-11-22 DIAGNOSIS — J449 Chronic obstructive pulmonary disease, unspecified: Secondary | ICD-10-CM | POA: Diagnosis not present

## 2015-11-22 DIAGNOSIS — I10 Essential (primary) hypertension: Secondary | ICD-10-CM | POA: Insufficient documentation

## 2015-11-22 DIAGNOSIS — Z8701 Personal history of pneumonia (recurrent): Secondary | ICD-10-CM | POA: Diagnosis not present

## 2015-11-22 DIAGNOSIS — D45 Polycythemia vera: Secondary | ICD-10-CM | POA: Diagnosis not present

## 2015-11-22 DIAGNOSIS — K219 Gastro-esophageal reflux disease without esophagitis: Secondary | ICD-10-CM | POA: Insufficient documentation

## 2015-11-22 DIAGNOSIS — Z7902 Long term (current) use of antithrombotics/antiplatelets: Secondary | ICD-10-CM | POA: Insufficient documentation

## 2015-11-22 DIAGNOSIS — I5022 Chronic systolic (congestive) heart failure: Secondary | ICD-10-CM | POA: Diagnosis not present

## 2015-11-22 LAB — I-STAT CHEM 8, ED
BUN: 15 mg/dL (ref 6–20)
CHLORIDE: 104 mmol/L (ref 101–111)
CREATININE: 1 mg/dL (ref 0.61–1.24)
Calcium, Ion: 1.24 mmol/L (ref 1.13–1.30)
Glucose, Bld: 98 mg/dL (ref 65–99)
HEMATOCRIT: 52 % (ref 39.0–52.0)
Hemoglobin: 17.7 g/dL — ABNORMAL HIGH (ref 13.0–17.0)
Potassium: 4 mmol/L (ref 3.5–5.1)
SODIUM: 143 mmol/L (ref 135–145)
TCO2: 25 mmol/L (ref 0–100)

## 2015-11-22 LAB — COMPREHENSIVE METABOLIC PANEL
ALBUMIN: 3.4 g/dL — AB (ref 3.5–5.0)
ALK PHOS: 86 U/L (ref 38–126)
ALT: 13 U/L — ABNORMAL LOW (ref 17–63)
AST: 20 U/L (ref 15–41)
Anion gap: 7 (ref 5–15)
BILIRUBIN TOTAL: 0.9 mg/dL (ref 0.3–1.2)
BUN: 12 mg/dL (ref 6–20)
CALCIUM: 9.2 mg/dL (ref 8.9–10.3)
CO2: 27 mmol/L (ref 22–32)
Chloride: 107 mmol/L (ref 101–111)
Creatinine, Ser: 1.04 mg/dL (ref 0.61–1.24)
GFR calc Af Amer: >60 mL/min (ref >60)
GFR calc non Af Amer: >60 mL/min (ref >60)
GLUCOSE: 104 mg/dL — AB (ref 65–99)
POTASSIUM: 4.3 mmol/L (ref 3.5–5.1)
SODIUM: 141 mmol/L (ref 135–145)
TOTAL PROTEIN: 6.4 g/dL — AB (ref 6.5–8.1)

## 2015-11-22 LAB — CBC
HCT: 49.1 % (ref 39.0–52.0)
HEMOGLOBIN: 15.3 g/dL (ref 13.0–17.0)
MCH: 26.7 pg (ref 26.0–34.0)
MCHC: 31.2 g/dL (ref 30.0–36.0)
MCV: 85.5 fL (ref 78.0–100.0)
PLATELETS: 151 10*3/uL (ref 150–400)
RBC: 5.74 MIL/uL (ref 4.22–5.81)
RDW: 15.9 % — ABNORMAL HIGH (ref 11.5–15.5)
WBC: 7.4 10*3/uL (ref 4.0–10.5)

## 2015-11-22 LAB — DIFFERENTIAL
BASOS ABS: 0 10*3/uL (ref 0.0–0.1)
Basophils Relative: 0 %
Eosinophils Absolute: 0.1 10*3/uL (ref 0.0–0.7)
Eosinophils Relative: 2 %
LYMPHS ABS: 0.9 10*3/uL (ref 0.7–4.0)
LYMPHS PCT: 13 %
Monocytes Absolute: 0.6 10*3/uL (ref 0.1–1.0)
Monocytes Relative: 8 %
NEUTROS PCT: 77 %
Neutro Abs: 5.7 10*3/uL (ref 1.7–7.7)

## 2015-11-22 LAB — APTT: APTT: 25 s (ref 24–37)

## 2015-11-22 LAB — I-STAT TROPONIN, ED: Troponin i, poc: 0.01 ng/mL (ref 0.00–0.08)

## 2015-11-22 LAB — PROTIME-INR
INR: 1 (ref 0.00–1.49)
Prothrombin Time: 13.4 seconds (ref 11.6–15.2)

## 2015-11-22 LAB — CBG MONITORING, ED: GLUCOSE-CAPILLARY: 104 mg/dL — AB (ref 65–99)

## 2015-11-22 NOTE — ED Notes (Signed)
Patient refused to allow discharge vitals states already dressed.

## 2015-11-22 NOTE — Discharge Instructions (Signed)
Follow up wit your md this week.   Return if problems

## 2015-11-22 NOTE — ED Provider Notes (Signed)
CSN: [id number]     Arrival date & time 11/22/15  1107 History   First MD Initiated Contact with Patient 11/22/15 1144     Chief Complaint  Patient presents with  . Blurred Vision     (Consider location/radiation/quality/duration/timing/severity/associated sxs/prior Treatment) Patient is a 80 y.o. male presenting with eye problem. The history is provided by the patient (The patient states that he had double vision in his left eye he said mainly. This lasted for about 45 minutes to an hour he is back to normal).  Eye Problem Location:  L eye Quality: Double vision. Severity:  Moderate Onset quality:  Sudden Timing:  Rare Progression:  Resolved Chronicity:  New Associated symptoms: no discharge and no headaches     Past Medical History  Diagnosis Date  . Polycythemia vera(238.4)     a. Used to receive chronic phlebotomies until 2007, at regional [place].  will restart his phlebotomies from about Mar 15 2011  . Stomach ulcer   . Tobacco abuse     a. cigars  . Duodenal perforation [facility]) June 2012  . Peritonitis [facility]) June 2012  . Pneumonia April 2012  . Systolic CHF, chronic ([place])     a. 12/2011 Echo: EF 25-30%, mid-dist antsept/inf, apical AK, Gr 1 DD, Triv AI, Mild MR.  . Ischemic cardiomyopathy     a. 01/2012 S/P MDT Protecta single lead ICD, ser # [id number]  . History of DVT (deep vein thrombosis)   . Chronic bronchitis   . GERD (gastroesophageal reflux disease)   . Myocardial infarction ([place])   . Shortness of breath   . COPD (chronic obstructive pulmonary disease) ([place])   . Hypertension   . CAD (coronary artery disease)     a. 07/2011 Anterior apical STEMI/Cath/PCI: LM nl, LAD 100p/m (2.5 x 82m Mini-Vision BMS), LCX 40p, RCA dominant, nl, EF 30%.  . Pulmonary embolism ([place]     a. Diagnosed 07/2015 - placed on Eliquis.  . Chronic respiratory failure ([place]   . Popliteal aneurysm ([place]   . Dilatation of thoracic aorta ([place]     a. CT 07/2015 - aneurysmal dilatation  of the descending thoracic aorta measuring 3.9 cm in greatest diameter.  .Marland KitchenAAA (abdominal aortic aneurysm) ([place]     a. CT 07/2015 - mild aneurysmal dilatation of the distal abdominal aorta measuring 3.2 x 3.4 cm.   Past Surgical History  Procedure Laterality Date  . Cholecystectomy  03/2011    Dr. TMarlou Starks . Colon surgery    . Cardiac catheterization  Sept 2012    Normal left main, occluded LAD, 40% LCX and normal RCA. EF is 30%  . UKoreaechocardiography  Sept 2012    EF 25 to 30% with akinesis of the mid to distal anterior and apical myocardium, trivial AI and no apical thrombus  . Cardiac defibrillator placement      single chamber  . Shoulder arthroscopy      left, rotatotor cuff tendinopathy  . Implantable cardioverter defibrillator implant N/A 01/11/2012    Procedure: IMPLANTABLE CARDIOVERTER DEFIBRILLATOR IMPLANT;  Surgeon: SDeboraha Sprang MD;  [facility];  Service: Cardiovascular;  Laterality: N/A;  . Peripheral vascular catheterization N/A 08/05/2015    Procedure: Lower Extremity Angiography;  Surgeon: VSerafina Mitchell MD;  [facility];  Service: Cardiovascular;  Laterality: N/A;   Family History  Problem Relation Age of Onset  . Lung disease Father     [name] worked at a [employer]    Social History  Substance Use Topics  . Smoking status: Former Smoker -- 0.50 packs/day for 60 years    Types: Cigars    Quit date: 05/18/2013  . Smokeless tobacco: Current User    Types: Chew  . Alcohol Use: Yes     Comment: rarely     Review of Systems  Constitutional: Negative for appetite change and fatigue.  HENT: Negative for congestion, ear discharge and sinus pressure.        Double vision  Eyes: Negative for discharge.  Respiratory: Negative for cough.   Cardiovascular: Negative for chest pain.  Gastrointestinal: Negative for abdominal pain and diarrhea.  Genitourinary: Negative for frequency and hematuria.  Musculoskeletal: Negative for back  pain.  Skin: Negative for rash.  Neurological: Negative for seizures and headaches.  Psychiatric/Behavioral: Negative for hallucinations.      Allergies  Review of patient's allergies indicates no known allergies.  Home Medications   Prior to Admission medications   Medication Sig Start Date End Date Taking? Authorizing Provider  amLODipine (NORVASC) 10 MG tablet Take 1 tablet (10 mg total) by mouth daily. 09/10/15  Yes Tanda Rockers, MD  apixaban (ELIQUIS) 5 MG TABS tablet Take 1 tablet (5 mg total) by mouth 2 (two) times daily. First dose on Tue 07/28/15 at 1000 07/28/15  Yes Shanker Kristeen Mans, MD  BROVANA 15 MCG/2ML NEBU USE ONE VIAL IN NEBULIZER TWICE DAILY 08/04/15  Yes Tanda Rockers, MD  cetirizine (ZYRTEC) 10 MG chewable tablet Chew 10 mg by mouth daily as needed for allergies.   Yes Historical Provider, MD  dextromethorphan-guaiFENesin (MUCINEX DM) 30-600 MG per 12 hr tablet Take 1 tablet by mouth 2 (two) times daily as needed for cough.   Yes Historical Provider, MD  furosemide (LASIX) 40 MG tablet Take 40 mg by mouth daily.    Yes Historical Provider, MD  hydroxyurea (HYDREA) 500 MG capsule TAKE ONE CAPSULE BY MOUTH ONCE DAILY WITH FOOD TO  MINIMIZE  GI  SIDE  EFFECTS 03/20/15  Yes Chauncey Cruel, MD  lovastatin (MEVACOR) 20 MG tablet Take 20 mg by mouth at bedtime.   Yes Historical Provider, MD  omeprazole (PRILOSEC) 20 MG capsule Take 20 mg by mouth daily.   Yes Historical Provider, MD  budesonide (PULMICORT) 0.25 MG/2ML nebulizer solution USE ONE VIAL IN NEBULIZER TWICE DAILY Patient not taking: Reported on 11/22/2015 08/04/15   Tanda Rockers, MD  metoprolol tartrate (LOPRESSOR) 25 MG tablet Take 1 tablet (25 mg total) by mouth 2 (two) times daily. 07/21/15   Shanker Kristeen Mans, MD  valsartan (DIOVAN) 80 MG tablet TAKE ONE TABLET BY MOUTH ONCE DAILY 05/06/15   Tanda Rockers, MD   Ht 6' (1.829 m)  Wt 228 lb (103.42 kg)  BMI 30.92 kg/m2  SpO2 92% Physical Exam   Constitutional: He is oriented to person, place, and time. He appears well-developed.  HENT:  Head: Normocephalic.  Eyes: Conjunctivae and EOM are normal. No scleral icterus.  Neck: Neck supple. No thyromegaly present.  Cardiovascular: Normal rate and regular rhythm.  Exam reveals no gallop and no friction rub.   No murmur heard. Pulmonary/Chest: No stridor. He has no wheezes. He has no rales. He exhibits no tenderness.  Abdominal: He exhibits no distension. There is no tenderness. There is no rebound.  Musculoskeletal: Normal range of motion. He exhibits no edema.  Lymphadenopathy:    He has no cervical adenopathy.  Neurological: He is oriented to person,  place, and time. He exhibits normal muscle tone. Coordination normal.  Skin: No rash noted. No erythema.  Psychiatric: He has a normal mood and affect. His behavior is normal.    ED Course  Procedures (including critical care time) Labs Review Labs Reviewed  CBC - Abnormal; Notable for the following:    RDW 15.9 (*)    All other components within normal limits  COMPREHENSIVE METABOLIC PANEL - Abnormal; Notable for the following:    Glucose, Bld 104 (*)    Total Protein 6.4 (*)    Albumin 3.4 (*)    ALT 13 (*)    All other components within normal limits  CBG MONITORING, ED - Abnormal; Notable for the following:    Glucose-Capillary 104 (*)    All other components within normal limits  I-STAT CHEM 8, ED - Abnormal; Notable for the following:    Hemoglobin 17.7 (*)    All other components within normal limits  PROTIME-INR  APTT  DIFFERENTIAL  I-STAT TROPOININ, ED  CBG MONITORING, ED    Imaging Review Dg Chest 2 View  11/22/2015  CLINICAL DATA:  Episode of double vision for 1.5 hours this morning, chronic cough and shortness of breath, COPD, defibrillator, prior MI, former smoker, ischemic cardiomyopathy EXAM: CHEST  2 VIEW COMPARISON:  07/19/2015 FINDINGS: LEFT subclavian AICD lead tip projects over RIGHT ventricle.  Minimal enlargement of cardiac silhouette. Atherosclerotic calcification aorta. Mediastinal contours normal. Enlarged central pulmonary arteries again noted. Emphysematous and chronic bronchitic changes consistent with COPD. Chronic interstitial lung disease changes in the mid to lower lungs bilaterally with questionable superimposed infiltrate or atelectasis at RIGHT base. Small RIGHT pleural effusion. No pneumothorax. Bones demineralized. IMPRESSION: Minimal enlargement of cardiac silhouette post AICD. Changes of COPD and chronic interstitial lung disease with small RIGHT pleural effusion and question mild superimposed atelectasis versus infiltrate at RIGHT base. Electronically Signed   By: Lavonia Dana M.D.   On: 11/22/2015 12:27   Ct Head Wo Contrast  11/22/2015  CLINICAL DATA:  Episode of double vision for 1.5 hours this morning, history COPD, former smoker, CHF, ischemic cardiomyopathy, coronary disease post MI EXAM: CT HEAD WITHOUT CONTRAST TECHNIQUE: Contiguous axial images were obtained from the base of the skull through the vertex without intravenous contrast. COMPARISON:  None FINDINGS: Generalized atrophy. Slightly asymmetric lateral ventricles RIGHT greater than LEFT, likely ex vacuo. No midline shift or mass effect. Old RIGHT MCA territory infarct with encephalomalacia in RIGHT hemisphere. Small vessel chronic ischemic changes of deep cerebral white matter. No intracranial hemorrhage, mass lesion, or evidence acute infarction. No extra-axial fluid collections. Orbits unremarkable. Visualized paranasal sinuses and mastoid air cells clear. Bones unremarkable. IMPRESSION: Atrophy with small vessel chronic ischemic changes of deep cerebral white matter. Old RIGHT MCA territory infarct. No acute intracranial abnormalities. Electronically Signed   By: Lavonia Dana M.D.   On: 11/22/2015 12:29   I have personally reviewed and evaluated these images and lab results as part of my medical decision-making.    EKG Interpretation   Date/Time:  Sunday November 22 2015 11:12:46 EST Ventricular Rate:  72 PR Interval:  185 QRS Duration: 100 QT Interval:  406 QTC Calculation: 444 R Axis:   -115 Text Interpretation:  Sinus rhythm Atrial premature complex Consider left  atrial enlargement Left anterior fascicular block Probable anterolateral  infarct, age indeterm Abnormal T, consider ischemia, lateral leads  Baseline wander in lead(s) V4 Confirmed by Eilyn Polack  MD, Naika Noto [id number]) on  11/22/2015 1:03:54 PM  MDM   Final diagnoses:  Blurred vision, left eye    Lab work was unremarkable CT scan shows old stroke. I spoke with the neurologist and since the patient is already on Eliquis and his symptoms have improved he felt like patient could follow-up with his PCP and no inpatient workup is necessary    Milton Ferguson, MD 11/22/15 [id number]

## 2015-11-22 NOTE — ED Notes (Addendum)
Pt. Woke up this am and was feeling okay. Was watching TV and developed lt. Eye blurred vision. When paramedics arrived they report that symptoms were resolve at 10:40 . Pt. S BP was elevated 176/102, AT Present symptoms are resolved. Pt. Denies any chest pain, Pt. Denies any pain .  GCS 15, Pt. Does have chronic SOB due to COPD but reports that it has increased with exertion.

## 2015-12-09 ENCOUNTER — Ambulatory Visit: Payer: Medicare Other | Admitting: Internal Medicine

## 2015-12-30 ENCOUNTER — Encounter: Payer: Self-pay | Admitting: Surgery

## 2016-01-04 ENCOUNTER — Ambulatory Visit: Payer: Medicare Other | Admitting: Surgery

## 2016-01-04 ENCOUNTER — Encounter (HOSPITAL_COMMUNITY): Payer: Medicare Other

## 2016-01-04 ENCOUNTER — Inpatient Hospital Stay (HOSPITAL_COMMUNITY): Admission: RE | Admit: 2016-01-04 | Payer: Medicare Other | Source: Ambulatory Visit

## 2016-01-20 ENCOUNTER — Emergency Department (HOSPITAL_COMMUNITY): Payer: Medicare Other

## 2016-01-20 ENCOUNTER — Emergency Department (HOSPITAL_COMMUNITY)
Admission: EM | Admit: 2016-01-20 | Discharge: 2016-01-20 | Disposition: A | Discharge status: Home / Self Care | Payer: Medicare Other | Attending: Emergency Medicine | Admitting: Emergency Medicine

## 2016-01-20 ENCOUNTER — Encounter (HOSPITAL_COMMUNITY): Payer: Self-pay

## 2016-01-20 DIAGNOSIS — Z9889 Other specified postprocedural states: Secondary | ICD-10-CM | POA: Diagnosis not present

## 2016-01-20 DIAGNOSIS — Z79899 Other long term (current) drug therapy: Secondary | ICD-10-CM | POA: Diagnosis not present

## 2016-01-20 DIAGNOSIS — Z862 Personal history of diseases of the blood and blood-forming organs and certain disorders involving the immune mechanism: Secondary | ICD-10-CM | POA: Insufficient documentation

## 2016-01-20 DIAGNOSIS — I5022 Chronic systolic (congestive) heart failure: Secondary | ICD-10-CM | POA: Diagnosis not present

## 2016-01-20 DIAGNOSIS — I2510 Atherosclerotic heart disease of native coronary artery without angina pectoris: Secondary | ICD-10-CM | POA: Insufficient documentation

## 2016-01-20 DIAGNOSIS — J449 Chronic obstructive pulmonary disease, unspecified: Secondary | ICD-10-CM | POA: Diagnosis not present

## 2016-01-20 DIAGNOSIS — Z8701 Personal history of pneumonia (recurrent): Secondary | ICD-10-CM | POA: Insufficient documentation

## 2016-01-20 DIAGNOSIS — K219 Gastro-esophageal reflux disease without esophagitis: Secondary | ICD-10-CM | POA: Diagnosis not present

## 2016-01-20 DIAGNOSIS — Z86711 Personal history of pulmonary embolism: Secondary | ICD-10-CM | POA: Insufficient documentation

## 2016-01-20 DIAGNOSIS — I471 Supraventricular tachycardia: Secondary | ICD-10-CM | POA: Diagnosis not present

## 2016-01-20 DIAGNOSIS — Z87891 Personal history of nicotine dependence: Secondary | ICD-10-CM | POA: Insufficient documentation

## 2016-01-20 DIAGNOSIS — I252 Old myocardial infarction: Secondary | ICD-10-CM | POA: Insufficient documentation

## 2016-01-20 DIAGNOSIS — Z86718 Personal history of other venous thrombosis and embolism: Secondary | ICD-10-CM | POA: Insufficient documentation

## 2016-01-20 DIAGNOSIS — R000 Tachycardia, unspecified: Secondary | ICD-10-CM | POA: Diagnosis present

## 2016-01-20 LAB — BASIC METABOLIC PANEL
Anion gap: 9 (ref 5–15)
BUN: 16 mg/dL (ref 6–20)
CALCIUM: 9.1 mg/dL (ref 8.9–10.3)
CO2: 25 mmol/L (ref 22–32)
CREATININE: 1.12 mg/dL (ref 0.61–1.24)
Chloride: 108 mmol/L (ref 101–111)
GFR calc Af Amer: >60 mL/min (ref >60)
Glucose, Bld: 93 mg/dL (ref 65–99)
POTASSIUM: 4.2 mmol/L (ref 3.5–5.1)
SODIUM: 142 mmol/L (ref 135–145)

## 2016-01-20 LAB — CBC
HCT: 51.2 % (ref 39.0–52.0)
Hemoglobin: 15.3 g/dL (ref 13.0–17.0)
MCH: 25.9 pg — ABNORMAL LOW (ref 26.0–34.0)
MCHC: 29.9 g/dL — ABNORMAL LOW (ref 30.0–36.0)
MCV: 86.8 fL (ref 78.0–100.0)
PLATELETS: 137 10*3/uL — AB (ref 150–400)
RBC: 5.9 MIL/uL — ABNORMAL HIGH (ref 4.22–5.81)
RDW: 15.9 % — AB (ref 11.5–15.5)
WBC: 8.1 10*3/uL (ref 4.0–10.5)

## 2016-01-20 LAB — I-STAT TROPONIN, ED: TROPONIN I, POC: 0.02 ng/mL (ref 0.00–0.08)

## 2016-01-20 NOTE — ED Notes (Signed)
Per [facility], Pt is coming from home. Pt started to feel dizzy and overall not well throughout the day. Wife took patient's VS and noticed pt's HR to be in the 180s. Wife called EMS. When EMS arrived, Patient had a HR of 180s, but was alert and oriented x4. Patient denied pain, but complained of dizzines. Patient was given 6 mg of Adenosine withy no change, then 12 mg with no change, and 12 mg with conversion. Pt's HR is in the 90s ST after conversion. Denies dizziness and pain. Initial Vitals per EMS: 102/64, 166 HR, 22 RR, 94% on 4L, 132 CBG. Last Vitals: 116/76 BP, and 98 HR. Pt has HX of COPD, Silent MI, High Cholesterol, and Chronic Oxygen use.

## 2016-01-20 NOTE — Discharge Instructions (Signed)
You were seen in the ED for Supraventricular tachycardia. If you have develop shortness of breath, chest pain or dizziness, return to the ED for evaluation Please follow with your primary care provider within the next week

## 2016-01-20 NOTE — ED Notes (Signed)
Patient verbalized understanding of discharge instructions and denies any further needs or questions at this time. VS stable. Patient ambulatory with steady gait. Assisted to ED entrance in wheelchair.   

## 2016-01-20 NOTE — ED Notes (Signed)
MD at bedside. 

## 2016-01-20 NOTE — ED Provider Notes (Signed)
CSN: [id number]     Arrival date & time 01/20/16  1732 History   None    Chief Complaint  Patient presents with  . Tachycardia     HPI  80 year old male presenting with acute onset shortness of breath and " fuzzy headedness" that started this AM. He also felt hot and clammy. His daughter later called EMS for these symptoms. He denies having chest pain, headache, changes in vision or abdominal pain associated. He denies actual dizziness or falls associated. Once EMS arrived he was found to be in SVT and adenosine '6mg'$ , 12 mg and 12 mg was administered with conversion. On presentation to the ED he denies feeling " fuzzy headed", denies chest pain, shortness of breath.  Otherwise he denies fevers, recent illnesses, cough, weakness, nausea, vomiting, diarrhea  Past Medical History  Diagnosis Date  . Polycythemia vera(238.4)     a. Used to receive chronic phlebotomies until 2007, at regional [facility].  will restart his phlebotomies from about Mar 15 2011  . Stomach ulcer   . Tobacco abuse     a. cigars  . Duodenal perforation [facility]) June 2012  . Peritonitis [facility]) June 2012  . Pneumonia April 2012  . Systolic CHF, chronic ([place])     a. 12/2011 Echo: EF 25-30%, mid-dist antsept/inf, apical AK, Gr 1 DD, Triv AI, Mild MR.  . Ischemic cardiomyopathy     a. 01/2012 S/P MDT Protecta single lead ICD, ser # [id number]  . History of DVT (deep vein thrombosis)   . Chronic bronchitis   . GERD (gastroesophageal reflux disease)   . Myocardial infarction ([place])   . Shortness of breath   . COPD (chronic obstructive pulmonary disease) ([place])   . Hypertension   . CAD (coronary artery disease)     a. 07/2011 Anterior apical STEMI/Cath/PCI: LM nl, LAD 100p/m (2.5 x 86m Mini-Vision BMS), LCX 40p, RCA dominant, nl, EF 30%.  . Pulmonary embolism ([place]     a. Diagnosed 07/2015 - placed on Eliquis.  . Chronic respiratory failure ([place]   . Popliteal aneurysm ([place]   . Dilatation of thoracic aorta ([place]      a. CT 07/2015 - aneurysmal dilatation of the descending thoracic aorta measuring 3.9 cm in greatest diameter.  .Marland KitchenAAA (abdominal aortic aneurysm) ([place]     a. CT 07/2015 - mild aneurysmal dilatation of the distal abdominal aorta measuring 3.2 x 3.4 cm.   Past Surgical History  Procedure Laterality Date  . Cholecystectomy  03/2011    Dr. TMarlou Starks . Colon surgery    . Cardiac catheterization  Sept 2012    Normal left main, occluded LAD, 40% LCX and normal RCA. EF is 30%  . UKoreaechocardiography  Sept 2012    EF 25 to 30% with akinesis of the mid to distal anterior and apical myocardium, trivial AI and no apical thrombus  . Cardiac defibrillator placement      single chamber  . Shoulder arthroscopy      left, rotatotor cuff tendinopathy  . Implantable cardioverter defibrillator implant N/A 01/11/2012    Procedure: IMPLANTABLE CARDIOVERTER DEFIBRILLATOR IMPLANT;  Surgeon: SDeboraha Sprang MD;  [facility];  Service: Cardiovascular;  Laterality: N/A;  . Peripheral vascular catheterization N/A 08/05/2015    Procedure: Lower Extremity Angiography;  Surgeon: VSerafina Mitchell MD;  [facility];  Service: Cardiovascular;  Laterality: N/A;   Family History  Problem Relation Age of Onset  . Lung disease Father  [name]- worked at a [employer] History  Substance Use Topics  . Smoking status: Former Smoker -- 0.50 packs/day for 60 years    Types: Cigars    Quit date: 05/18/2013  . Smokeless tobacco: Current User    Types: Chew  . Alcohol Use: Yes     Comment: rarely     Review of Systems  Constitutional: Negative.   HENT: Negative.   Eyes: Negative.   Respiratory: Negative.   Cardiovascular: Negative.   Gastrointestinal: Negative.   Genitourinary: Negative.   Musculoskeletal: Negative.   Skin: Negative.   Neurological: Negative.   Psychiatric/Behavioral: Negative.       Allergies  Review of patient's allergies indicates no known  allergies.  Home Medications   Prior to Admission medications   Medication Sig Start Date End Date Taking? Authorizing Provider  amLODipine (NORVASC) 10 MG tablet Take 1 tablet (10 mg total) by mouth daily. 09/10/15   Tanda Rockers, MD  apixaban (ELIQUIS) 5 MG TABS tablet Take 1 tablet (5 mg total) by mouth 2 (two) times daily. First dose on Tue 07/28/15 at 1000 07/28/15   Shanker Kristeen Mans, MD  BROVANA 15 MCG/2ML NEBU USE ONE VIAL IN NEBULIZER TWICE DAILY 08/04/15   Tanda Rockers, MD  budesonide (PULMICORT) 0.25 MG/2ML nebulizer solution USE ONE VIAL IN NEBULIZER TWICE DAILY Patient not taking: Reported on 11/22/2015 08/04/15   Tanda Rockers, MD  cetirizine (ZYRTEC) 10 MG chewable tablet Chew 10 mg by mouth daily as needed for allergies.    Historical Provider, MD  dextromethorphan-guaiFENesin (MUCINEX DM) 30-600 MG per 12 hr tablet Take 1 tablet by mouth 2 (two) times daily as needed for cough.    Historical Provider, MD  furosemide (LASIX) 40 MG tablet Take 40 mg by mouth daily.     Historical Provider, MD  hydroxyurea (HYDREA) 500 MG capsule TAKE ONE CAPSULE BY MOUTH ONCE DAILY WITH FOOD TO  MINIMIZE  GI  SIDE  EFFECTS 03/20/15   Chauncey Cruel, MD  lovastatin (MEVACOR) 20 MG tablet Take 20 mg by mouth at bedtime.    Historical Provider, MD  metoprolol tartrate (LOPRESSOR) 25 MG tablet Take 1 tablet (25 mg total) by mouth 2 (two) times daily. 07/21/15   Shanker Kristeen Mans, MD  omeprazole (PRILOSEC) 20 MG capsule Take 20 mg by mouth daily.    Historical Provider, MD  valsartan (DIOVAN) 80 MG tablet TAKE ONE TABLET BY MOUTH ONCE DAILY 05/06/15   Tanda Rockers, MD   BP 107/72 mmHg  Pulse 88  Temp(Src) 97.9 F (36.6 C) (Oral)  Resp 26  SpO2 99% Physical Exam  Constitutional: He is oriented to person, place, and time. He appears well-developed and well-nourished.  HENT:  Head: Normocephalic and atraumatic.  Neck: Normal range of motion.  Cardiovascular: Normal rate and regular rhythm.    Pulmonary/Chest: Effort normal and breath sounds normal.  Abdominal: Soft. Bowel sounds are normal. He exhibits no distension.  Musculoskeletal: Normal range of motion.  Neurological: He is alert and oriented to person, place, and time.  Skin: Skin is warm and dry.  Psychiatric: He has a normal mood and affect.    ED Course  Procedures (including critical care time) Labs Review Labs Reviewed  CBC - Abnormal; Notable for the following:    RBC 5.90 (*)    MCH 25.9 (*)    MCHC 29.9 (*)    RDW 15.9 (*)    Platelets 137 (*)  All other components within normal limits  BASIC METABOLIC PANEL  I-STAT TROPOININ, ED    Imaging Review No results found. I have personally reviewed and evaluated these images and lab results as part of my medical decision-making.   EKG Interpretation   Date/Time:  Wednesday January 20 2016 17:35:20 EDT Ventricular Rate:  87 PR Interval:  197 QRS Duration: 100 QT Interval:  352 QTC Calculation: 423 R Axis:   -81 Text Interpretation:  Sinus rhythm Left anterior fascicular block Probable  anterolateral infarct, age indeterm Abnormal T, consider ischemia, lateral  leads No significant change since last tracing Confirmed by [facility] MD,  Corene Cornea [id number]) on 01/20/2016 5:44:32 PM      MDM   Final diagnoses:  SVT (supraventricular tachycardia) ([place])    80 y/o male presenting for a SVT status post conversion by EMS. Vital signs within normal limits in the ED. No evidence of ACS with no chest pain, negative troponin. EKG showed no evidence of ischemia x2. Chest Xray showed stable pulmonary findings consistent with COPD history with no other cardio pulmonary pathology.     Norma Ignasiak A. Lincoln Brigham MD, [facility] Resident PGY-2 Pager [phone]   Veatrice Bourbon, MD 01/20/16 Tyrone Apple, MD 01/20/16 [phone]

## 2016-01-27 ENCOUNTER — Emergency Department (HOSPITAL_COMMUNITY)
Admission: EM | Admit: 2016-01-27 | Discharge: 2016-01-27 | Disposition: A | Discharge status: Home / Self Care | Payer: Medicare Other | Attending: Emergency Medicine | Admitting: Emergency Medicine

## 2016-01-27 ENCOUNTER — Emergency Department (HOSPITAL_COMMUNITY): Payer: Medicare Other

## 2016-01-27 ENCOUNTER — Encounter (HOSPITAL_COMMUNITY): Payer: Self-pay | Admitting: *Deleted

## 2016-01-27 ENCOUNTER — Ambulatory Visit: Payer: Medicare Other | Admitting: Cardiology

## 2016-01-27 DIAGNOSIS — R140 Abdominal distension (gaseous): Secondary | ICD-10-CM | POA: Insufficient documentation

## 2016-01-27 DIAGNOSIS — I252 Old myocardial infarction: Secondary | ICD-10-CM | POA: Insufficient documentation

## 2016-01-27 DIAGNOSIS — I2510 Atherosclerotic heart disease of native coronary artery without angina pectoris: Secondary | ICD-10-CM | POA: Insufficient documentation

## 2016-01-27 DIAGNOSIS — Z7901 Long term (current) use of anticoagulants: Secondary | ICD-10-CM | POA: Insufficient documentation

## 2016-01-27 DIAGNOSIS — K219 Gastro-esophageal reflux disease without esophagitis: Secondary | ICD-10-CM | POA: Diagnosis not present

## 2016-01-27 DIAGNOSIS — J441 Chronic obstructive pulmonary disease with (acute) exacerbation: Secondary | ICD-10-CM | POA: Diagnosis not present

## 2016-01-27 DIAGNOSIS — I5022 Chronic systolic (congestive) heart failure: Secondary | ICD-10-CM | POA: Diagnosis not present

## 2016-01-27 DIAGNOSIS — I10 Essential (primary) hypertension: Secondary | ICD-10-CM | POA: Diagnosis not present

## 2016-01-27 DIAGNOSIS — R0602 Shortness of breath: Secondary | ICD-10-CM | POA: Diagnosis present

## 2016-01-27 DIAGNOSIS — Z87891 Personal history of nicotine dependence: Secondary | ICD-10-CM | POA: Insufficient documentation

## 2016-01-27 DIAGNOSIS — Z7951 Long term (current) use of inhaled steroids: Secondary | ICD-10-CM | POA: Insufficient documentation

## 2016-01-27 DIAGNOSIS — Z9889 Other specified postprocedural states: Secondary | ICD-10-CM | POA: Insufficient documentation

## 2016-01-27 DIAGNOSIS — Z79899 Other long term (current) drug therapy: Secondary | ICD-10-CM | POA: Insufficient documentation

## 2016-01-27 DIAGNOSIS — R197 Diarrhea, unspecified: Secondary | ICD-10-CM | POA: Insufficient documentation

## 2016-01-27 DIAGNOSIS — Z86018 Personal history of other benign neoplasm: Secondary | ICD-10-CM | POA: Insufficient documentation

## 2016-01-27 DIAGNOSIS — Z95810 Presence of automatic (implantable) cardiac defibrillator: Secondary | ICD-10-CM | POA: Diagnosis not present

## 2016-01-27 DIAGNOSIS — Z8701 Personal history of pneumonia (recurrent): Secondary | ICD-10-CM | POA: Diagnosis not present

## 2016-01-27 DIAGNOSIS — Z86711 Personal history of pulmonary embolism: Secondary | ICD-10-CM | POA: Diagnosis not present

## 2016-01-27 DIAGNOSIS — Z86718 Personal history of other venous thrombosis and embolism: Secondary | ICD-10-CM | POA: Diagnosis not present

## 2016-01-27 DIAGNOSIS — R112 Nausea with vomiting, unspecified: Secondary | ICD-10-CM | POA: Diagnosis not present

## 2016-01-27 LAB — COMPREHENSIVE METABOLIC PANEL
ALBUMIN: 3.5 g/dL (ref 3.5–5.0)
ALT: 17 U/L (ref 17–63)
AST: 31 U/L (ref 15–41)
Alkaline Phosphatase: 85 U/L (ref 38–126)
Anion gap: 12 (ref 5–15)
BUN: 20 mg/dL (ref 6–20)
CHLORIDE: 105 mmol/L (ref 101–111)
CO2: 22 mmol/L (ref 22–32)
CREATININE: 1.49 mg/dL — AB (ref 0.61–1.24)
Calcium: 8.9 mg/dL (ref 8.9–10.3)
GFR calc Af Amer: 50 mL/min — ABNORMAL LOW (ref >60)
GFR, EST NON AFRICAN AMERICAN: 44 mL/min — AB (ref >60)
GLUCOSE: 115 mg/dL — AB (ref 65–99)
Potassium: 3.7 mmol/L (ref 3.5–5.1)
Sodium: 139 mmol/L (ref 135–145)
Total Bilirubin: 0.9 mg/dL (ref 0.3–1.2)
Total Protein: 6.5 g/dL (ref 6.5–8.1)

## 2016-01-27 LAB — CBC
HEMATOCRIT: 53.4 % — AB (ref 39.0–52.0)
Hemoglobin: 16 g/dL (ref 13.0–17.0)
MCH: 25.5 pg — AB (ref 26.0–34.0)
MCHC: 30 g/dL (ref 30.0–36.0)
MCV: 85 fL (ref 78.0–100.0)
PLATELETS: 116 10*3/uL — AB (ref 150–400)
RBC: 6.28 MIL/uL — ABNORMAL HIGH (ref 4.22–5.81)
RDW: 15.7 % — AB (ref 11.5–15.5)
WBC: 12.1 10*3/uL — AB (ref 4.0–10.5)

## 2016-01-27 LAB — URINE MICROSCOPIC-ADD ON

## 2016-01-27 LAB — URINALYSIS, ROUTINE W REFLEX MICROSCOPIC
GLUCOSE, UA: NEGATIVE mg/dL
KETONES UR: NEGATIVE mg/dL
LEUKOCYTES UA: NEGATIVE
Nitrite: NEGATIVE
PH: 5 (ref 5.0–8.0)
PROTEIN: 100 mg/dL — AB
Specific Gravity, Urine: 1.021 (ref 1.005–1.030)

## 2016-01-27 LAB — LIPASE, BLOOD: LIPASE: 118 U/L — AB (ref 11–51)

## 2016-01-27 MED ORDER — PREDNISONE 20 MG PO TABS
60.0000 mg | ORAL_TABLET | Freq: Once | ORAL | Status: AC
  Administered 2016-01-27: 60 mg via ORAL
  Filled 2016-01-27: qty 3

## 2016-01-27 MED ORDER — IPRATROPIUM-ALBUTEROL 0.5-2.5 (3) MG/3ML IN SOLN
3.0000 mL | Freq: Once | RESPIRATORY_TRACT | Status: AC
  Administered 2016-01-27: 3 mL via RESPIRATORY_TRACT
  Filled 2016-01-27: qty 3

## 2016-01-27 MED ORDER — PREDNISONE 20 MG PO TABS: 60.0000 mg | ORAL_TABLET | Freq: Every day | ORAL | Status: DC

## 2016-01-27 MED ORDER — AZITHROMYCIN 250 MG PO TABS: ORAL_TABLET | ORAL | Status: DC

## 2016-01-27 NOTE — ED Notes (Signed)
Pt stable, ambulatory, states understanding of discharge instructions 

## 2016-01-27 NOTE — ED Notes (Addendum)
Per EMS- pt was at his PCP for N/V/D for 2 days. Pt reports worsening SOB as well. Pt denies pain or nausea at this time. No vomiting reported with EMS. No reported fevers

## 2016-01-27 NOTE — ED Provider Notes (Signed)
CSN: [id number]     Arrival date & time 01/27/16  1704 History   First MD Initiated Contact with Patient 01/27/16 1729     Chief Complaint  Patient presents with  . Emesis  . Diarrhea  . Shortness of Breath     (Consider location/radiation/quality/duration/timing/severity/associated sxs/prior Treatment) Patient is a 80 y.o. male presenting with GI illness. The history is provided by the patient.  GI Problem This is a new problem. Episode onset: 2 days ago. The problem has been unchanged. Associated symptoms include nausea and vomiting (vomiting two days ago, none since). Pertinent negatives include no abdominal pain, chest pain, coughing, diaphoresis, fever, headaches, myalgias, rash, sore throat or weakness. Associated symptoms comments: Diarrhea and SOB. Nothing aggravates the symptoms. He has tried nothing for the symptoms.    Past Medical History  Diagnosis Date  . Polycythemia vera(238.4)     a. Used to receive chronic phlebotomies until 2007, at regional [place].  will restart his phlebotomies from about Mar 15 2011  . Stomach ulcer   . Tobacco abuse     a. cigars  . Duodenal perforation [facility]) June 2012  . Peritonitis [facility]) June 2012  . Pneumonia April 2012  . Systolic CHF, chronic ([place])     a. 12/2011 Echo: EF 25-30%, mid-dist antsept/inf, apical AK, Gr 1 DD, Triv AI, Mild MR.  . Ischemic cardiomyopathy     a. 01/2012 S/P MDT Protecta single lead ICD, ser # [id number] H  . History of DVT (deep vein thrombosis)   . Chronic bronchitis   . GERD (gastroesophageal reflux disease)   . Myocardial infarction ([place])   . Shortness of breath   . COPD (chronic obstructive pulmonary disease) ([place])   . Hypertension   . CAD (coronary artery disease)     a. 07/2011 Anterior apical STEMI/Cath/PCI: LM nl, LAD 100p/m (2.5 x 91m Mini-Vision BMS), LCX 40p, RCA dominant, nl, EF 30%.  . Pulmonary embolism ([place]     a. Diagnosed 07/2015 - placed on Eliquis.  . Chronic respiratory failure  ([place]   . 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 Dilatation of thoracic aorta ([place]     a. CT 07/2015 - aneurysmal dilatation of the descending thoracic aorta measuring 3.9 cm in greatest diameter.  .Marland KitchenAAA (abdominal aortic aneurysm) ([place]     a. CT 07/2015 - mild aneurysmal dilatation of the distal abdominal aorta measuring 3.2 x 3.4 cm.   Past Surgical History  Procedure Laterality Date  . Cholecystectomy  03/2011    Dr. TMarlou Starks . Colon surgery    . Cardiac catheterization  Sept 2012    Normal left main, occluded LAD, 40% LCX and normal RCA. EF is 30%  . UKoreaechocardiography  Sept 2012    EF 25 to 30% with akinesis of the mid to distal anterior and apical myocardium, trivial AI and no apical thrombus  . Cardiac defibrillator placement      single chamber  . Shoulder arthroscopy      left, rotatotor cuff tendinopathy  . Implantable cardioverter defibrillator implant N/A 01/11/2012    Procedure: IMPLANTABLE CARDIOVERTER DEFIBRILLATOR IMPLANT;  Surgeon: SDeboraha Sprang MD;  [facility];  Service: Cardiovascular;  Laterality: N/A;  . Peripheral vascular catheterization N/A 08/05/2015    Procedure: Lower Extremity Angiography;  Surgeon: VSerafina Mitchell MD;  [facility];  Service: Cardiovascular;  Laterality: N/A;   Family History  Problem Relation Age of Onset  . Lung disease Father  [name]- worked at a [place] History  Substance Use Topics  . Smoking status: Former Smoker -- 0.50 packs/day for 60 years    Types: Cigars    Quit date: 05/18/2013  . Smokeless tobacco: Current User    Types: Chew  . Alcohol Use: Yes     Comment: rarely     Review of Systems  Constitutional: Negative for fever, diaphoresis, activity change and appetite change.  HENT: Negative for facial swelling, sore throat, tinnitus, trouble swallowing and voice change.   Eyes: Negative for pain, redness and visual disturbance.  Respiratory: Positive for shortness of breath.  Negative for cough, chest tightness and wheezing.   Cardiovascular: Negative for chest pain, palpitations and leg swelling.  Gastrointestinal: Positive for nausea, vomiting (vomiting two days ago, none since) and diarrhea. Negative for abdominal pain, constipation and abdominal distention.  Endocrine: Negative.   Genitourinary: Negative.  Negative for dysuria, decreased urine volume, scrotal swelling and testicular pain.  Musculoskeletal: Negative for myalgias, back pain and gait problem.  Skin: Negative.  Negative for rash.  Neurological: Negative.  Negative for dizziness, tremors, weakness and headaches.  Psychiatric/Behavioral: Negative for suicidal ideas, hallucinations and self-injury. The patient is not nervous/anxious.       Allergies  Review of patient's allergies indicates no known allergies.  Home Medications   Prior to Admission medications   Medication Sig Start Date End Date Taking? Authorizing Provider  amLODipine (NORVASC) 10 MG tablet Take 1 tablet (10 mg total) by mouth daily. 09/10/15  Yes Tanda Rockers, MD  apixaban (ELIQUIS) 5 MG TABS tablet Take 1 tablet (5 mg total) by mouth 2 (two) times daily. First dose on Tue 07/28/15 at 1000 07/28/15  Yes Shanker Kristeen Mans, MD  BROVANA 15 MCG/2ML NEBU USE ONE VIAL IN NEBULIZER TWICE DAILY 08/04/15  Yes Tanda Rockers, MD  budesonide (PULMICORT) 0.25 MG/2ML nebulizer solution USE ONE VIAL IN NEBULIZER TWICE DAILY 08/04/15  Yes Tanda Rockers, MD  cetirizine (ZYRTEC) 10 MG chewable tablet Chew 10 mg by mouth daily as needed for allergies.   Yes Historical Provider, MD  dextromethorphan-guaiFENesin (MUCINEX DM) 30-600 MG per 12 hr tablet Take 1 tablet by mouth 2 (two) times daily as needed for cough.   Yes Historical Provider, MD  furosemide (LASIX) 40 MG tablet Take 40 mg by mouth daily.    Yes Historical Provider, MD  hydroxyurea (HYDREA) 500 MG capsule TAKE ONE CAPSULE BY MOUTH ONCE DAILY WITH FOOD TO  MINIMIZE  GI  SIDE  EFFECTS  03/20/15  Yes Chauncey Cruel, MD  lovastatin (MEVACOR) 20 MG tablet Take 20 mg by mouth at bedtime.   Yes Historical Provider, MD  metoprolol tartrate (LOPRESSOR) 25 MG tablet Take 1 tablet (25 mg total) by mouth 2 (two) times daily. 07/21/15  Yes Shanker Kristeen Mans, MD  omeprazole (PRILOSEC) 20 MG capsule Take 20 mg by mouth daily.   Yes Historical Provider, MD  valsartan (DIOVAN) 80 MG tablet TAKE ONE TABLET BY MOUTH ONCE DAILY 05/06/15  Yes Tanda Rockers, MD  azithromycin (ZITHROMAX) 250 MG tablet Take 2 tablets by mouth day one and one tablet by mouth daily once a day for the next 4 days 01/27/16   Margaretann Loveless, MD  predniSONE (DELTASONE) 20 MG tablet Take 3 tablets (60 mg total) by mouth daily. 01/28/16   Margaretann Loveless, MD   BP 101/66 mmHg  Pulse 81  Temp(Src) 98.3 F (36.8 C) (Oral)  Resp  22  Wt 100.245 kg  SpO2 94% Physical Exam  Constitutional: He is oriented to person, place, and time. He appears well-developed and well-nourished. No distress.  HENT:  Head: Normocephalic and atraumatic.  Right Ear: External ear normal.  Left Ear: External ear normal.  [place] in place  Eyes: Conjunctivae and EOM are normal. Pupils are equal, round, and reactive to light. No scleral icterus.  Neck: Normal range of motion. Neck supple. No JVD present. No tracheal deviation present. No thyromegaly present.  Cardiovascular: Normal rate and intact distal pulses.  Exam reveals no gallop and no friction rub.   No murmur heard. Pulmonary/Chest: Effort normal and breath sounds normal. No stridor. No respiratory distress. He has no wheezes. He has no rales.  Abdominal: Soft. He exhibits distension. There is no tenderness. There is no rebound and no guarding.  No abdominal tenderness to palpation on exam  Musculoskeletal: Normal range of motion. He exhibits no edema or tenderness.  Neurological: He is alert and oriented to person, place, and time. No cranial nerve deficit. He exhibits normal muscle tone.  Coordination normal.  5/5 strength in all 4 extremities. Normal Gait.   Skin: Skin is warm and dry. No rash noted. He is not diaphoretic.  Psychiatric: He has a normal mood and affect. His behavior is normal.  Nursing note and vitals reviewed.   ED Course  Procedures (including critical care time) Labs Review Labs Reviewed  LIPASE, BLOOD - Abnormal; Notable for the following:    Lipase 118 (*)    All other components within normal limits  COMPREHENSIVE METABOLIC PANEL - Abnormal; Notable for the following:    Glucose, Bld 115 (*)    Creatinine, Ser 1.49 (*)    GFR calc non Af Amer 44 (*)    GFR calc Af Amer 50 (*)    All other components within normal limits  CBC - Abnormal; Notable for the following:    WBC 12.1 (*)    RBC 6.28 (*)    HCT 53.4 (*)    MCH 25.5 (*)    RDW 15.7 (*)    Platelets 116 (*)    All other components within normal limits  URINALYSIS, ROUTINE W REFLEX MICROSCOPIC (NOT AT [facility]) - Abnormal; Notable for the following:    Color, Urine AMBER (*)    Hgb urine dipstick SMALL (*)    Bilirubin Urine MODERATE (*)    Protein, ur 100 (*)    All other components within normal limits  URINE MICROSCOPIC-ADD ON - Abnormal; Notable for the following:    Squamous Epithelial / LPF 0-5 (*)    Bacteria, UA FEW (*)    Casts GRANULAR CAST (*)    All other components within normal limits    Imaging Review Dg Chest 2 View  01/27/2016  CLINICAL DATA:  Short of breath for 2 day EXAM: CHEST  2 VIEW COMPARISON:  01/20/2016 FINDINGS: Cardiomegaly. Left subclavian AICD device is stable. Heterogeneous opacities at both lung bases are improved. Chronic blunting of the right costophrenic angle. IMPRESSION: Improved bibasilar bronchopneumonia. Electronically Signed   By: Marybelle Killings M.D.   On: 01/27/2016 18:23   I have personally reviewed and evaluated these images and lab results as part of my medical decision-making.   EKG Interpretation   Date/Time:  Wednesday January 27 2016  17:20:53 EDT Ventricular Rate:  78 PR Interval:  180 QRS Duration: 106 QT Interval:  408 QTC Calculation: 465 R Axis:   -83 Text Interpretation:  Sinus rhythm Left anterior fascicular block Probable  anterolateral infarct, age indeterm Abnormal T, consider ischemia, lateral  leads No significant change since last tracing Confirmed by LITTLE MD,  RACHEL [phone]) on 01/27/2016 5:34:21 PM      MDM   Final diagnoses:  COPD exacerbation ([place])    The patient is a 80 year old male with a history of COPD with 2 L oxygen requirement at home who presents for 2 days of nausea vomiting diarrhea and shortness of breath. He denies fever or productive cough. Patient reports he had 1 episode of emesis 2 days ago and since however he has continued to have nausea and diarrhea. He is tolerating by mouth well on arrival. Afebrile mechanically stable. Physical exam benign as above. X-rays read as improved bibasilar bronchopneumonia. Recent failure of outpatient treatment. Patient satting well on 2 L. Feel he is appropriate for treatment with prednisone and azithromycin. Standard ED return precautions) care follow-up given. Patient expresses understanding and agreement with this plan.  Patient seen with attending, Dr. Rex Kras, who oversaw clinical decision making.     Margaretann Loveless, MD 01/27/16 2107  Sharlett Iles, MD 01/28/16 [phone]

## 2016-01-27 NOTE — ED Notes (Signed)
MD at bedside. 

## 2016-02-14 ENCOUNTER — Other Ambulatory Visit: Payer: Self-pay

## 2016-06-23 ENCOUNTER — Ambulatory Visit (INDEPENDENT_AMBULATORY_CARE_PROVIDER_SITE_OTHER): Payer: Medicare Other | Admitting: Internal Medicine

## 2016-06-23 ENCOUNTER — Encounter: Payer: Self-pay | Admitting: Internal Medicine

## 2016-06-23 VITALS — BP 132/72 | HR 82 | Ht 72.0 in | Wt 219.0 lb

## 2016-06-23 DIAGNOSIS — J449 Chronic obstructive pulmonary disease, unspecified: Secondary | ICD-10-CM

## 2016-06-23 DIAGNOSIS — J9611 Chronic respiratory failure with hypoxia: Secondary | ICD-10-CM | POA: Diagnosis not present

## 2016-06-23 NOTE — Patient Instructions (Signed)
Plan A = Automatic =  Brovana/budesonide twice daily   Plan B = Backup Only use your albuterol as a rescue medication to be used if you can't catch your breath by resting or doing a relaxed purse lip breathing pattern.  - The less you use it, the better it will work when you need it. - Ok to use the abuterol up to every 4 hours if you must but call for appointment if use goes up over your usual need    If you are satisfied with your treatment plan,  let your doctor know and he/she can either refill your medications or you can return here when your prescription runs out.     If in any way you are not 100% satisfied,  please tell us.  If 100% better, tell your friends!  Pulmonary follow up is as needed

## 2016-06-23 NOTE — Progress Notes (Signed)
Subjective:    Patient ID: Jesus Sanders, male    DOB: 1936-09-03   MRN: [id number]    Brief patient profile:  58 yowm quit smoking 2014 admitted to [facility] with establish GOLD IV copd as of 04/03/14    Admit date: 02/21/2014  Discharge date: 02/23/2014     Discharge Diagnoses:  Acute-on-chronic respiratory failure   Polycythemia vera(238.4)  HTN (hypertension)  Ischemic cardiomyopathy  GERD (gastroesophageal reflux disease)  COPD exacerbation, likely         Filed Weights    02/21/14 1945  02/22/14 0502   Weight:  100.8 kg (222 lb 3.6 oz)  100.6 kg (221 lb 12.5 oz)   History of present illness:  80 y.o. male came to [name] ed 02/21/2014 with shortness of breath. No history of polycythemia vera, CAD history 07/2011 anterior apical MI S./P. BMS to LAD resulting in ischemic cardiomyopathy with systolic CHF [systolic 32%] s/p defibrillator 01/2012, severe COPD FEV1/FVC 0.40, H/o DVt in 2001 and [name] to have AECOPD and admitted for the same  Hospital Course:  Acute-on-chronic respiratory failure-likely AECOPD-treat with inhaled nebulizers every 4 when necessary, add steroids to 60 mg prednisone . Patient educated regarding inhalers as he is only on albuterol which is a rescue inhaler but he thought that his nebulizer was to be used Q6 when necessary and inhaler was a controller med  RT to teach use of proper use of inhaler and when to use what  I will prescribe for him Advair forCOPD Gold stage IV at least . He will need a burst of steroids for about 5-7 days and anticipate he will do well.might benefit from Cardio-pulm rehab  Polycythemia vera(238.4) currently with anemia presently-currently on Hydrea and gets prophylactic phlebotomy. To keep his hematocrit less than 40 which is clearly not at that . hydroxyurea was restarted February 2015. He will followup with his outpatient oncologist when needed  HTN (hypertension)-continue the medications as below. Blood pressure only moderately  controlled currently. Increase Lisinopril or add Amlodpine if still elevated in am  Ischemic cardiomyopathy, CAD history , placement of defibrillator 2012-keep on telemetry. Currently doing well -may benefit from alternative agent other than Coreg 25 mg as has COPD . Continue ACE inhibitor lisinopril 10 daily , continue aspirin 81 mg daily  GERD (gastroesophageal reflux disease) continue Protonix in place of Prilosec  History of DVT 2001-completed therapy with Coumadin  History CVA-stable currently  acute kidney injury BN/creatinine 25/1.24. Labs about the same monitor in the am. Lasix held initially but re-started. Will need close monitoring of bmet as OP  Hyperlipidemia-hold statin for now   05/16/2014 1st [facility] office visit/ Jesus Sanders / [place] IV COPD  Chief Complaint  Patient presents with  . Pulmoanary Consult    Pt states that he was referred per Cardiac Rehab.  He c/o DOE for "I don't know, several years".  Occurs when "I do too much".     indolent onset doe x sev years to point where has trouble to mb and back flat has to stop half way back but not every day Also has it sitting still  Also has it also sometimes lying down Can do all but the most rigorous ex at rehab s 02  Better on dulera 100 2bid but not sure of what all meds he's using.   >>d/c ACE and rx Diovan    06/02/2014 Np note  Patient returns for a followup and medication review. Unfortunately, pt did not  bring any meds with him today.  He brought his pill box , his grand daughter does his meds.  He does not know any of his meds.  He does not have any prescription coverage.  Discussed with grandaughter on phone , can not afford inhalers.  Goes to cardiopulmonary  rehab. Uses Oxygen with exercise.  Not taking Dulera since last ov.   Reports breathing is somewhat worse since last ov with SOB, wheezing, thorat congestion. Last visit. Patient was changed off of his ACE inhibitor and started on Diovan. Gets short of  breath with minimal activity .  O2 sats 94% at rest  Walking O2 sats 87%.  Patient denies any hemoptysis, chest pain, orthopnea, abdominal pain, nausea, vomiting, or leg swelling.  >>started on O2. With walking. rx brovana/pulmicort neb    06/23/2014   NP note Last ov was started on pulmicort and brovana (he does not have rx coverage). And started on O2 with walking for desats.  He appears to be taking meds correctly. Says he breathing is stable with no flare in cough or wheezing.  rec Follow med calendar closely and bring to each visit.  Continue on current regimen .  Change to Mucinex DM Twice daily  As needed  Cough/congestion  May use Albuterol Neb every 4hrs as needed for wheezing/shortness of breath -this is your rescue medicine.  Continue with Oxygen with walking.    02/17/2015 f/u ov/Jesus Sanders re: GOLD IV copd/ 02 dep/ no med cal/ no 02/no grand daughter, thoroughly confused with details of care  Chief Complaint  Patient presents with  . Follow-up    Pt states breathing is progressively worse since his last visit here. He was SOB walking from lobby to exam room today. He states SOB sometimes at rest. He also c/o clearing his throat often.    rec Be sure to take your furosemide when your legs/feet are swollen  Please remember to go to the lab and x-ray department downstairs for your tests - we will call you with the results when they are available. Try to wear your 02 24/7 at 2lpm but especially at bedtime and with any activity more than room to room walking   See Tammy NP w/in 2 weeks> did not do    Admit Date: 07/19/2015 Discharge date: 07/21/2015  Recommendations for Outpatient Follow-up:  1. Please ensure follow up with [facility]-Jesus Sanders-for popliteal aneurysm 2. Please repeat CBC/BMET at next visit 3. Note-changed from Coreg to Metoprolol 4. Note:Chronic but enlarging PE-started on Eliquis 5. Will need monitoring of AAA and descending thoracic anuerysm  PRIMARY DISCHARGE  DIAGNOSIS: Principal Problem:  Acute respiratory failure Active Problems:  Polycythemia vera  CAD (coronary artery disease)  Chronic systolic heart failure  HTN (hypertension)  GERD (gastroesophageal reflux disease)  Hyperlipidemia  Automatic implantable cardioverter-defibrillator- medtronic  COPD GOLD IV  COPD exacerbation  Popliteal artery aneurysm    PAST MEDICAL HISTORY: Past Medical History  Diagnosis Date  . Polycythemia vera(238.4)     a. Used to receive chronic phlebotomies until 2007, at regional [place]. will restart his phlebotomies from about Mar 15 2011  . Stomach ulcer   . Tobacco abuse     a. cigars  . Duodenal perforation June 2012  . Peritonitis June 2012  . Pneumonia April 2012  . Systolic CHF, chronic     a. 12/2011 Echo: EF 25-30%, mid-dist antsept/inf, apical AK, Gr 1 DD, Triv AI, Mild MR.  . Ischemic cardiomyopathy     a.  01/2012 S/P MDT Protecta single lead ICD, ser # [id number] H  . History of DVT (deep vein thrombosis)   . Chronic bronchitis   . GERD (gastroesophageal reflux disease)   . Myocardial infarction   . Shortness of breath   . COPD (chronic obstructive pulmonary disease)   . Hypertension   . CAD (coronary artery disease)     a. 07/2011 Anterior apical STEMI/Cath/PCI: LM nl, LAD 100p/m (2.5 x 62m Mini-Vision BMS), LCX 40p, RCA dominant, nl, EF 30%.    DISCHARGE MEDICATIONS: Current Discharge Medication List    START taking these medications   Details  !! apixaban (ELIQUIS) 5 MG TABS tablet Take 2 tablets (10 mg total) by mouth 2 (two) times daily. 10 mg, Oral, 2 times daily, First dose on Tue 07/21/15 at 1515, For 14 doses Qty: 14 tablet, Refills: 0    !! apixaban (ELIQUIS) 5 MG TABS tablet Take 1 tablet (5 mg total) by mouth 2 (two) times daily. First dose on Tue 07/28/15 at 1000 Qty: 120 tablet, Refills: 0    levofloxacin (LEVAQUIN) 750  MG tablet Take 1 tablet (750 mg total) by mouth daily. Qty: 3 tablet, Refills: 0    metoprolol tartrate (LOPRESSOR) 25 MG tablet Take 1 tablet (25 mg total) by mouth 2 (two) times daily. Qty: 60 tablet, Refills: 0    predniSONE (DELTASONE) 10 MG tablet Take 4 tablets (40 mg) daily for 2 days, then, Take 3 tablets (30 mg) daily for 2 days, then, Take 2 tablets (20 mg) daily for 2 days, then, Take 1 tablets (10 mg) daily for 1 days, then stop Qty: 19 tablet, Refills: 0    !! - Potential duplicate medications found. Please discuss with provider.    CONTINUE these medications which have NOT CHANGED   Details  hydroxyurea (HYDREA) 500 MG capsule TAKE ONE CAPSULE BY MOUTH ONCE DAILY WITH FOOD TO MINIMIZE GI SIDE EFFECTS Qty: 30 capsule, Refills: 0    albuterol (PROVENTIL HFA;VENTOLIN HFA) 108 (90 BASE) MCG/ACT inhaler Inhale 2 puffs into the lungs every 6 (six) hours as needed for wheezing or shortness of breath. Qty: 18 g, Refills: 6    amLODipine (NORVASC) 10 MG tablet Take 1 tablet (10 mg total) by mouth daily. Qty: 30 tablet, Refills: 0    arformoterol (BROVANA) 15 MCG/2ML NEBU Take 2 mLs (15 mcg total) by nebulization 2 (two) times daily. Dx 491.9 Qty: 120 mL, Refills: 6   Associated Diagnoses: COPD mixed type    budesonide (PULMICORT) 0.25 MG/2ML nebulizer solution Take 2 mLs (0.25 mg total) by nebulization 2 (two) times daily. Dx 491.9 Qty: 120 mL, Refills: 6   Associated Diagnoses: COPD mixed type    dextromethorphan-guaiFENesin (MUCINEX DM) 30-600 MG per 12 hr tablet Take 1 tablet by mouth 2 (two) times daily as needed for cough.    furosemide (LASIX) 40 MG tablet Take 40 mg by mouth daily.     lovastatin (MEVACOR) 20 MG tablet Take 20 mg by mouth at bedtime.    omeprazole (PRILOSEC) 20 MG capsule Take 20 mg by mouth daily.    valsartan (DIOVAN) 80 MG tablet TAKE ONE TABLET BY MOUTH ONCE DAILY Qty: 30 tablet, Refills: 0        STOP taking these medications     aspirin EC 81 MG tablet      carvedilol (COREG) 25 MG tablet         ALLERGIES: No Known Allergies  BRIEF HPI: See H&P, Labs, Consult and Test reports  for all details in brief, patient was admitted for evaluation of worsening shortness of breath, felt to have COPD exacerbation and admitted for further evaluation and treatment.  CONSULTATIONS:  vascular surgery  PERTINENT RADIOLOGIC STUDIES:  Imaging Results    Ct Angio Ao+bifem W/cm &/or Wo/cm  07/21/2015 CLINICAL DATA: Shortness of breath. Lower extremity venous duplex ultrasound revealed evidence of a right popliteal artery aneurysm. EXAM: CT ANGIOGRAPHY OF ABDOMINAL AORTA WITH ILIOFEMORAL RUNOFF TECHNIQUE: Multidetector CT imaging of the abdomen, pelvis and lower extremities was performed using the standard protocol during bolus administration of intravenous contrast. Multiplanar CT image reconstructions and MIPs were obtained to evaluate the vascular anatomy. CONTRAST: 160m OMNIPAQUE IOHEXOL 350 MG/ML SOLN COMPARISON: CTA of the chest on 02/21/2014 FINDINGS: Aorta: The distal descending thoracic aorta shows aneurysmal dilatation measuring 3.9 cm in greatest diameter and containing a small amount of mural thrombus. At the level of the diaphragmatic hiatus, the proximal abdominal aorta measures 3.7 cm in greatest diameter. The aorta tapers to a normal caliber by the level of the celiac axis, measuring 3 cm. In the infrarenal segment, mild dilatation is present with the distal aorta measuring 3.2 x 3.4 cm. Mild plaque is present at the origins of the celiac axis and superior mesenteric artery without significant stenosis identified. Mild plaque at the origin of a single right renal artery does not cause significant stenosis. Two separate left renal arteries identified without significant stenosis. The inferior mesenteric artery is normally patent. Right Lower Extremity:  Diffuse calcified plaque is noted involving the common and internal iliac arteries without evidence of significant stenosis. The trunk of the right internal iliac artery demonstrates mild aneurysmal dilatation measuring 11 mm. The proximal external iliac artery shows mild plaque. The external iliac artery shows no significant stenosis. The common femoral artery shows mild plaque without significant stenosis. The femoral bifurcation is normally patent. The right superficial femoral artery demonstrates scattered plaque without significant stenosis. Maximal narrowing in the distal thigh approaches 40%. The profunda femoral artery is normally patent. Elongated and tortuous popliteal artery aneurysm present measuring 3.6 x 4.1 cm in greatest transverse diameter. The opacified lumen of the aneurysm is normal in caliber with the rest of the aneurysm demonstrating thrombosis. Aneurysmal dilatation ends just above the knee joint. The rest of the popliteal artery is normally patent below the knee. Below the knee, dominant runoff is present via the anterior tibial artery which is continuously patent into the foot. The posterior tibial artery is occluded in the proximal calf. The peroneal artery is diseased but is open into the foot. Left Lower Extremity: Left iliac arteries show calcified plaque without significant stenosis. Mild aneurysmal dilatation of the proximal left internal iliac artery measures 14 mm. The common femoral artery is normally patent. The femoral bifurcation shows normal patency. The profunda femoral artery is normally patent. The superficial femoral artery shows scattered plaque without significant stenosis. The popliteal artery demonstrates minimal focal dilatation just above the knee to 11 mm. No significant popliteal stenosis is identified. Below the knee, three-vessel patent runoff is present with fairly equal sized anterior and posterior tibial arteries. Laminated mural thrombus in the posterior  and inferior aspect of the right pulmonary artery extends just into the right lower lobe pulmonary artery. This thrombus measures just over 2 cm in maximal thickness and is more prominent compared to the prior CTA in April, 2015. This is still consistent with chronic thrombus but has clearly increased in prominence. Calcified plaque seen in the visualized coronary artery  tree. Calcified pleural plaque is again identified in the right hemithorax. Prominent adjacent parenchymal scarring and atelectasis near the level of pleural plaque appears similar compared to the prior CT. Multiple collateral veins are again visible in the lower right chest wall lateral right chest wall. The patient has an indwelling implanted defibrillator and collateral venous drainage may relate to chronic venous inflow stenosis in the chest related to the defibrillator. Review of the MIP images confirms the above findings. IMPRESSION: 1. Aneurysmal dilatation of the descending thoracic aorta measuring 3.9 cm in greatest diameter. 2. Mild aneurysmal dilatation of the distal abdominal aorta measuring 3.2 x 3.4 cm. 3. Mild aneurysmal disease of the proximal right internal iliac artery measuring 11 mm. 4. Prominent and elongated right popliteal artery aneurysm measuring 3.6 x 4.1 cm in greatest transverse diameter. This aneurysm is largely thrombosed. Patent flow is present through the aneurysmal segment. 5. Occlusion of the proximal right posterior tibial artery. Two-vessel runoff is present on the right below the knee. 6. Aneurysmal disease of the proximal left internal iliac artery measuring 14 mm. 7. Mild dilatation of the proximal left popliteal artery just above the knee measuring 11 mm. Three-vessel runoff is present below the knee on the left. 8. More prominent laminated mural thrombus in the posterior and inferior aspect of the right pulmonary artery just extending into the right lower lobe pulmonary artery. This does not have the  appearance of acute pulmonary embolism but clearly has increased in thickness and prominence compared to the prior CTA of the chest in April, 2015. Consider anticoagulation therapy for this finding. Electronically Signed By: Aletta Edouard M.D. On: 07/21/2015 13:38   Dg Chest Portable 1 View  07/19/2015 CLINICAL DATA: Respiratory distress EXAM: PORTABLE CHEST - 1 VIEW COMPARISON: 02/17/2015 FINDINGS: Lung bases are omitted from the field of view. Heart size at least moderately enlarged, with central vascular congestion. Left single lead pacer in place. Prominence of the left hilum compatible with prominent vascular pedicle reidentified. Patchy bibasilar airspace opacities are identified but likely not significantly changed since the prior exam, likely scarring. IMPRESSION: Suboptimal visualization with portable technique and partial obscuration of the lung bases. Emphysema with superimposed bibasilar scarring, although early pneumonia is not excluded. If symptoms persist, consider PA and lateral chest radiographs obtained at full inspiration when the patient is clinically able. Electronically Signed By: Conchita Paris M.D. On: 07/19/2015 12:23      PERTINENT LAB RESULTS: CBC:  Recent Labs (last 2 labs)      Recent Labs  07/19/15 1522 07/20/15 0612  WBC 17.5* 14.8*  HGB 15.4 15.5  HCT 50.1 49.9  PLT 138* 136*     CMET CMP  Labs (Brief)       Component Value Date/Time   NA 136 07/20/2015 0612   NA 144 01/14/2015 1027   K 4.6 07/20/2015 0612   K 4.2 01/14/2015 1027   CL 102 07/20/2015 0612   CO2 26 07/20/2015 0612   CO2 28 01/14/2015 1027   GLUCOSE 129* 07/20/2015 0612   GLUCOSE 131 01/14/2015 1027   BUN 28* 07/20/2015 0612   BUN 19.2 01/14/2015 1027   CREATININE 1.43* 07/20/2015 0612   CREATININE 1.3 01/14/2015 1027   CREATININE 1.57* 10/24/2012 1327   CALCIUM 9.2 07/20/2015  0612   CALCIUM 10.0 01/14/2015 1027   PROT 6.5 07/19/2015 1522   PROT 7.1 01/14/2015 1027   ALBUMIN 3.5 07/19/2015 1522   ALBUMIN 3.9 01/14/2015 1027   AST 19 07/19/2015 1522   AST  17 01/14/2015 1027   ALT 11* 07/19/2015 1522   ALT 12 01/14/2015 1027   ALKPHOS 78 07/19/2015 1522   ALKPHOS 96 01/14/2015 1027   BILITOT 0.6 07/19/2015 1522   BILITOT 0.97 01/14/2015 1027   GFRNONAA 46* 07/20/2015 0612   GFRNONAA 43* 10/24/2012 1327   GFRAA 53* 07/20/2015 0612   GFRAA 49* 10/24/2012 1327      GFR Estimated Creatinine Clearance: 53.4 mL/min (by C-G formula based on Cr of 1.43).  Recent Labs (last 2 labs)     No results for input(s): LIPASE, AMYLASE in the last 72 hours.    Recent Labs (last 2 labs)      Recent Labs  07/19/15 1905 07/20/15 0035 07/20/15 0612  TROPONINI <0.03 <0.03 <0.03      Recent Labs (last 2 labs)     Invalid input(s): POCBNP    Recent Labs (last 2 labs)     No results for input(s): DDIMER in the last 72 hours.    Recent Labs (last 2 labs)     No results for input(s): HGBA1C in the last 72 hours.    Recent Labs (last 2 labs)     No results for input(s): CHOL, HDL, LDLCALC, TRIG, CHOLHDL, LDLDIRECT in the last 72 hours.    Recent Labs (last 2 labs)     No results for input(s): TSH, T4TOTAL, T3FREE, THYROIDAB in the last 72 hours.  Invalid input(s): FREET3    Recent Labs (last 2 labs)     No results for input(s): VITAMINB12, FOLATE, FERRITIN, TIBC, IRON, RETICCTPCT in the last 72 hours.   Coags:  Recent Labs (last 2 labs)     No results for input(s): INR in the last 72 hours.  Invalid input(s): PT   Microbiology: Recent Results (from the past 240 hour(s))  Blood culture (routine x 2) Status: None (Preliminary result)   Collection Time: 07/19/15 2:02 PM  Result Value Ref Range Status   Specimen Description BLOOD LEFT ARM  Final   Special  Requests BOTTLES DRAWN AEROBIC AND ANAEROBIC 5CC  Final   Culture NO GROWTH 2 DAYS  Final   Report Status PENDING  Incomplete  Blood culture (routine x 2) Status: None (Preliminary result)   Collection Time: 07/19/15 2:07 PM  Result Value Ref Range Status   Specimen Description BLOOD RIGHT HAND  Final   Special Requests BOTTLES DRAWN AEROBIC ONLY 5CC  Final   Culture NO GROWTH 2 DAYS  Final   Report Status PENDING  Incomplete  MRSA PCR Screening Status: Abnormal   Collection Time: 07/19/15 10:33 PM  Result Value Ref Range Status   MRSA by PCR POSITIVE (A) NEGATIVE Final    Comment:   The GeneXpert MRSA Assay (FDA approved for NASAL specimens only), is one component of a comprehensive MRSA colonization surveillance program. It is not intended to diagnose MRSA infection nor to guide or monitor treatment for MRSA infections. RESULT CALLED TO, READ BACK BY AND VERIFIED WITH: IRBY,T RN [id number] 9/11/6 MITCHELL,L 07/19/15      BRIEF HOSPITAL COURSE:  Acute on chronic hypoxic respiratory failure: Secondary to COPD exacerbation, much improved. Required BiPAP on admission in the emergency room, now back on 3-4 L of oxygen via nasal cannula (home regimen)   COPD exacerbation: Significantly improved, no rhonchi-lungs are clear to auscultation. Patient feels he is back to his baseline. Will transition from Solu-Medrol to prednisone, patient asked to continue his regular inhaler/neb regimen and follow up with his PCP and Pulmonologist.  Popliteal aneurysm: Discovered incidentally on a lower extremity Doppler, seen by vascular surgery, plans up for a CT angiogram prior to discharge and outpatient follow-up with vascular surgery for definite workup/treatment.  Pulmonary Embolism:CT Angiogram done for above-demonstrated a chronic PE-this was reconfirmed with Jesus Kathlene Cote radiologist, and then case was reviewed with PCCM-Jesus  Nestor-who looked at the chart-recommendations are to start Anticoagulation. Case was discussed with patient-choices of anticoagulation was discussed-have started anticoagulation.   Polycythemia vera: Continue Hydrea-follow-up hemoglobin and hematocrit closely-if any significant worsening-will need therapeutic phlebotomy. Please monitor closely in the outpatient setting.  Chronic systolic heart failure: Suspect acute respiratory failure secondary to COPD rather than CHF. CHF remains compensated, continue oral Lasix, metoprolol and ARB. Weight remains stable at 223 pounds.   Descending Thoracic Aneurysm and EXH:BZJI on CT Chest-stable for further monitoring to be done in the outpatient setting  Automatic implantable cardioverter-defibrillator- medtronic  GERD (gastroesophageal reflux disease): Continue PPI.   HTN (hypertension): BP controlled-continue with metoprolol, Lasix, ARB and amlodipine.   CAD (coronary artery disease): Continue aspirin, statin and beta blocker. Troponins negative.  Addendum: will stop ASA given patient will be on Eliquis  History of COPD GOLD IV         07/28/2015 extended post hosp f/u ov/Sabastian Raimondi re: GOLD IV copd/ chronic resp failure Chief Complaint  Patient presents with  . HFU    Pt states that his breathing is doing well overall. He c/o frequent need to clear his throat.   Patient again is very confused with details of care. He did not take advantage of my offer to do medication reconciliation with my nurse practitioner and shows very little insight into his medications including how to use his nebulizer versus his other medicines in an appropriate way. He is now using oxygen 2 L 24 hours a day. However, he is extremely sedentary and cannot really answer the question ? what activity limited from doing because of his breathing? rec GERD diet  02 2lpm 24/7:  Brovana/budesonide automatically twice daily  Only use your albuterol as a rescue medication    Follow up here is as needed -  If you need to return for your breathing you will need to bring all your medications and inhalers and solutions with you to verify what you take is exactly what we have you listed as taking before we consider changing any of your meds      06/23/2016  f/u ov/Twanda Stakes re: copd GOLD IV/brov/bud/02 2lpm  chronic resp failure/ non adherent    Chief Complaint  Patient presents with  . Follow-up    pt c/o stable sob with exertion.  no other complaints today.    doe = MMRC3 = can't walk 100 yards even at a slow pace at a flat grade s stopping due to sob  Even on 02 but says this is no change in baseline    No obvious day to day or daytime variabilty or assoc excess or purulent mucus  or cp or chest tightness, subjective wheeze overt sinus or hb symptoms. No unusual exp hx or h/o childhood pna/ asthma or knowledge of premature birth.  Sleeping ok without nocturnal  or early am exacerbation  of respiratory  c/o's or need for noct saba. Also denies any obvious fluctuation of symptoms with weather or environmental changes or other aggravating or alleviating factors except as outlined above   Current Medications, Allergies, Complete Past Medical History, Past Surgical History, Family History, and Social History were reviewed in [facility] electronic medical record.  ROS  The following are not active complaints unless bolded sore throat, dysphagia, dental problems, itching, sneezing,  nasal congestion or excess/ purulent secretions, ear ache,   fever, chills, sweats, unintended wt loss, pleuritic or exertional cp, hemoptysis,  orthopnea pnd or leg swelling  , presyncope, palpitations, heartburn, abdominal pain, anorexia, nausea, vomiting, diarrhea  or change in bowel or urinary habits, change in stools or urine, dysuria,hematuria,  rash, arthralgias, visual complaints, headache, numbness weakness or ataxia or problems with walking or coordination,  change in mood/affect or  memory.               Objective:   Physical Exam   07/28/2015        226 >  06/23/2016   219    02/17/15 234 lb 3.2 oz (106.232 kg)  01/22/15 240 lb 12.8 oz (109.226 kg)  01/14/15 229 lb 4.8 oz (104.01 kg)    Vital signs reviewed     amb elderly wm nad   HEENT mild turbinate edema.  Oropharynx no thrush or excess pnd or cobblestoning.  No JVD or cervical adenopathy. Mild accessory muscle hypertrophy. Trachea midline, nl thryroid. Chest was hyperinflated by percussion with diminished breath sounds and moderate increased exp time without wheeze. Regular rate and rhythm without murmur gallop or rub or increase P2  - trace  pitting edema both lower ext .  Abd: no hsm, nl excursion. Ext warm without cyanosis or clubbing.       pCXR  :   07/19/15  I personally reviewed images and agree with radiology impression as follows:   Suboptimal visualization with portable technique and partial obscuration of the lung bases. Emphysema with superimposed bibasilar scarring   labs reviewed   Chemistry      Component Value Date/Time   NA 136 07/20/2015 0612   NA 144 01/14/2015 1027   K 4.6 07/20/2015 0612   K 4.2 01/14/2015 1027   CL 102 07/20/2015 0612   CO2 26 07/20/2015 0612   CO2 28 01/14/2015 1027   BUN 28* 07/20/2015 0612   BUN 19.2 01/14/2015 1027   CREATININE 1.43* 07/20/2015 0612   CREATININE 1.3 01/14/2015 1027   CREATININE 1.57* 10/24/2012 1327      Component Value Date/Time   CALCIUM 9.2 07/20/2015 0612   CALCIUM 10.0 01/14/2015 1027   ALKPHOS 78 07/19/2015 1522   ALKPHOS 96 01/14/2015 1027   AST 19 07/19/2015 1522   AST 17 01/14/2015 1027   ALT 11* 07/19/2015 1522   ALT 12 01/14/2015 1027   BILITOT 0.6 07/19/2015 1522   BILITOT 0.97 01/14/2015 1027       Lab Results  Component Value Date   WBC 14.8* 07/20/2015   HGB 15.5 07/20/2015   HCT 49.9 07/20/2015   MCV 90.6 07/20/2015   PLT 136* 07/20/2015          Assessment & Plan:

## 2016-06-26 NOTE — Assessment & Plan Note (Signed)
-  ambulatory desats 87% on RA >O2 w/ act 06/02/2014  -ONO 06/02/2014 >> never completed  - Sat on arrival RA 02/17/2015 = 88% - 02/17/2015  Walked 2lpm x 1 laps = 185 ft   stopped due to sob/ nl pace/ no desat    rec 02 2lpm 24/7 as of  06/23/2016   Adequate control on present rx, reviewed > no change in rx needed

## 2016-06-26 NOTE — Assessment & Plan Note (Addendum)
PFT's 04/03/14 FEV1  0.90 (27%) ratio 38 and DLCO 19% corrects to 30   F/v physiologic - spirometry 05/17/14  FEV1  0.91 (26%) ratio 42 but f/v non physiologic -  Med calendar 06/23/2014 > not using as of 02/17/15   Very severe copd with no change activity tol on bud/brovana bid and no tendency to aecopd so no change in rx needed  Each maintenance medication was reviewed in detail including most importantly the difference between maintenance and as needed and under what circumstances the prns are to be used.  Please see instructions for details which were reviewed in writing and the patient given a copy.    Pulmonary f/u can be prn

## 2016-08-09 ENCOUNTER — Telehealth: Payer: Self-pay | Admitting: Oncology

## 2016-08-09 NOTE — Telephone Encounter (Signed)
Pt daughter called to r/s fathers missed lab/ov in 2016

## 2016-08-30 ENCOUNTER — Emergency Department (HOSPITAL_COMMUNITY): Payer: Medicare Other

## 2016-08-30 ENCOUNTER — Inpatient Hospital Stay (HOSPITAL_COMMUNITY)
Admission: EM | Admit: 2016-08-30 | Discharge: 2016-09-01 | DRG: 180 | Disposition: A | Discharge status: Home / Self Care | Payer: Medicare Other | Attending: Family Medicine | Admitting: Family Medicine

## 2016-08-30 ENCOUNTER — Encounter (HOSPITAL_COMMUNITY): Payer: Self-pay | Admitting: Emergency Medicine

## 2016-08-30 DIAGNOSIS — Z86718 Personal history of other venous thrombosis and embolism: Secondary | ICD-10-CM | POA: Diagnosis not present

## 2016-08-30 DIAGNOSIS — I110 Hypertensive heart disease with heart failure: Secondary | ICD-10-CM | POA: Diagnosis present

## 2016-08-30 DIAGNOSIS — I5022 Chronic systolic (congestive) heart failure: Secondary | ICD-10-CM | POA: Diagnosis present

## 2016-08-30 DIAGNOSIS — R0602 Shortness of breath: Secondary | ICD-10-CM | POA: Diagnosis present

## 2016-08-30 DIAGNOSIS — Z955 Presence of coronary angioplasty implant and graft: Secondary | ICD-10-CM | POA: Diagnosis not present

## 2016-08-30 DIAGNOSIS — E785 Hyperlipidemia, unspecified: Secondary | ICD-10-CM | POA: Diagnosis present

## 2016-08-30 DIAGNOSIS — J441 Chronic obstructive pulmonary disease with (acute) exacerbation: Secondary | ICD-10-CM | POA: Diagnosis present

## 2016-08-30 DIAGNOSIS — R0603 Acute respiratory distress: Secondary | ICD-10-CM | POA: Diagnosis not present

## 2016-08-30 DIAGNOSIS — I714 Abdominal aortic aneurysm, without rupture: Secondary | ICD-10-CM | POA: Diagnosis present

## 2016-08-30 DIAGNOSIS — F17220 Nicotine dependence, chewing tobacco, uncomplicated: Secondary | ICD-10-CM | POA: Diagnosis present

## 2016-08-30 DIAGNOSIS — I2510 Atherosclerotic heart disease of native coronary artery without angina pectoris: Secondary | ICD-10-CM | POA: Diagnosis present

## 2016-08-30 DIAGNOSIS — I252 Old myocardial infarction: Secondary | ICD-10-CM | POA: Diagnosis not present

## 2016-08-30 DIAGNOSIS — D45 Polycythemia vera: Secondary | ICD-10-CM | POA: Diagnosis present

## 2016-08-30 DIAGNOSIS — C3412 Malignant neoplasm of upper lobe, left bronchus or lung: Principal | ICD-10-CM | POA: Diagnosis present

## 2016-08-30 DIAGNOSIS — Z23 Encounter for immunization: Secondary | ICD-10-CM

## 2016-08-30 DIAGNOSIS — I499 Cardiac arrhythmia, unspecified: Secondary | ICD-10-CM | POA: Diagnosis not present

## 2016-08-30 DIAGNOSIS — I471 Supraventricular tachycardia: Secondary | ICD-10-CM | POA: Diagnosis present

## 2016-08-30 DIAGNOSIS — Z79899 Other long term (current) drug therapy: Secondary | ICD-10-CM | POA: Diagnosis not present

## 2016-08-30 DIAGNOSIS — R918 Other nonspecific abnormal finding of lung field: Secondary | ICD-10-CM | POA: Diagnosis not present

## 2016-08-30 DIAGNOSIS — R0600 Dyspnea, unspecified: Secondary | ICD-10-CM | POA: Diagnosis not present

## 2016-08-30 DIAGNOSIS — I2782 Chronic pulmonary embolism: Secondary | ICD-10-CM | POA: Diagnosis present

## 2016-08-30 DIAGNOSIS — Z95810 Presence of automatic (implantable) cardiac defibrillator: Secondary | ICD-10-CM

## 2016-08-30 DIAGNOSIS — K219 Gastro-esophageal reflux disease without esophagitis: Secondary | ICD-10-CM | POA: Diagnosis present

## 2016-08-30 DIAGNOSIS — Z8673 Personal history of transient ischemic attack (TIA), and cerebral infarction without residual deficits: Secondary | ICD-10-CM

## 2016-08-30 DIAGNOSIS — I240 Acute coronary thrombosis not resulting in myocardial infarction: Secondary | ICD-10-CM

## 2016-08-30 DIAGNOSIS — J9621 Acute and chronic respiratory failure with hypoxia: Secondary | ICD-10-CM | POA: Diagnosis present

## 2016-08-30 DIAGNOSIS — I513 Intracardiac thrombosis, not elsewhere classified: Secondary | ICD-10-CM | POA: Diagnosis not present

## 2016-08-30 DIAGNOSIS — I255 Ischemic cardiomyopathy: Secondary | ICD-10-CM | POA: Diagnosis present

## 2016-08-30 DIAGNOSIS — I2699 Other pulmonary embolism without acute cor pulmonale: Secondary | ICD-10-CM | POA: Diagnosis not present

## 2016-08-30 LAB — I-STAT ARTERIAL BLOOD GAS, ED
ACID-BASE DEFICIT: 3 mmol/L — AB (ref 0.0–2.0)
BICARBONATE: 22.4 mmol/L (ref 20.0–28.0)
O2 Saturation: 95 %
PH ART: 7.37 (ref 7.350–7.450)
TCO2: 24 mmol/L (ref 0–100)
pCO2 arterial: 38.8 mmHg (ref 32.0–48.0)
pO2, Arterial: 77 mmHg — ABNORMAL LOW (ref 83.0–108.0)

## 2016-08-30 LAB — CBC WITH DIFFERENTIAL/PLATELET
BASOS ABS: 0 10*3/uL (ref 0.0–0.1)
BASOS PCT: 0 %
EOS ABS: 0.1 10*3/uL (ref 0.0–0.7)
EOS PCT: 2 %
HCT: 54.1 % — ABNORMAL HIGH (ref 39.0–52.0)
Hemoglobin: 16.8 g/dL (ref 13.0–17.0)
Lymphocytes Relative: 13 %
Lymphs Abs: 1 10*3/uL (ref 0.7–4.0)
MCH: 26.4 pg (ref 26.0–34.0)
MCHC: 31.1 g/dL (ref 30.0–36.0)
MCV: 85.1 fL (ref 78.0–100.0)
MONO ABS: 0.5 10*3/uL (ref 0.1–1.0)
Monocytes Relative: 7 %
Neutro Abs: 6.1 10*3/uL (ref 1.7–7.7)
Neutrophils Relative %: 78 %
PLATELETS: 125 10*3/uL — AB (ref 150–400)
RBC: 6.36 MIL/uL — AB (ref 4.22–5.81)
RDW: 16.7 % — AB (ref 11.5–15.5)
WBC: 7.8 10*3/uL (ref 4.0–10.5)

## 2016-08-30 LAB — I-STAT TROPONIN, ED: TROPONIN I, POC: 0 ng/mL (ref 0.00–0.08)

## 2016-08-30 LAB — I-STAT CHEM 8, ED
BUN: 21 mg/dL — ABNORMAL HIGH (ref 6–20)
CREATININE: 1.2 mg/dL (ref 0.61–1.24)
Calcium, Ion: 1.14 mmol/L — ABNORMAL LOW (ref 1.15–1.40)
Chloride: 107 mmol/L (ref 101–111)
GLUCOSE: 102 mg/dL — AB (ref 65–99)
HEMATOCRIT: 54 % — AB (ref 39.0–52.0)
HEMOGLOBIN: 18.4 g/dL — AB (ref 13.0–17.0)
POTASSIUM: 5.1 mmol/L (ref 3.5–5.1)
Sodium: 141 mmol/L (ref 135–145)
TCO2: 26 mmol/L (ref 0–100)

## 2016-08-30 LAB — BASIC METABOLIC PANEL
Anion gap: 6 (ref 5–15)
BUN: 14 mg/dL (ref 6–20)
CALCIUM: 9.2 mg/dL (ref 8.9–10.3)
CO2: 26 mmol/L (ref 22–32)
CREATININE: 1.2 mg/dL (ref 0.61–1.24)
Chloride: 109 mmol/L (ref 101–111)
GFR calc Af Amer: >60 mL/min (ref >60)
GFR, EST NON AFRICAN AMERICAN: 56 mL/min — AB (ref >60)
GLUCOSE: 105 mg/dL — AB (ref 65–99)
POTASSIUM: 5.1 mmol/L (ref 3.5–5.1)
SODIUM: 141 mmol/L (ref 135–145)

## 2016-08-30 LAB — BRAIN NATRIURETIC PEPTIDE: B NATRIURETIC PEPTIDE 5: 78.5 pg/mL (ref 0.0–100.0)

## 2016-08-30 LAB — TROPONIN I
Troponin I: <0.03 ng/mL (ref <0.03)
Troponin I: <0.03 ng/mL (ref <0.03)

## 2016-08-30 LAB — MRSA PCR SCREENING: MRSA BY PCR: NEGATIVE

## 2016-08-30 MED ORDER — ACETAMINOPHEN 325 MG PO TABS: 650.0000 mg | ORAL_TABLET | Freq: Four times a day (QID) | ORAL | Status: DC | PRN

## 2016-08-30 MED ORDER — SODIUM CHLORIDE 0.9% FLUSH: 3.0000 mL | INTRAVENOUS | Status: DC | PRN

## 2016-08-30 MED ORDER — IPRATROPIUM-ALBUTEROL 0.5-2.5 (3) MG/3ML IN SOLN: 3.0000 mL | RESPIRATORY_TRACT | Status: DC | PRN

## 2016-08-30 MED ORDER — IOPAMIDOL (ISOVUE-370) INJECTION 76%
INTRAVENOUS | Status: AC
  Administered 2016-08-30: 100 mL via INTRAVENOUS
  Filled 2016-08-30: qty 100

## 2016-08-30 MED ORDER — SODIUM CHLORIDE 0.9% FLUSH
3.0000 mL | Freq: Two times a day (BID) | INTRAVENOUS | Status: DC
  Administered 2016-08-30 – 2016-08-31 (×3): 3 mL via INTRAVENOUS

## 2016-08-30 MED ORDER — SODIUM CHLORIDE 0.9% FLUSH
3.0000 mL | Freq: Two times a day (BID) | INTRAVENOUS | Status: DC
  Administered 2016-08-31 (×2): 3 mL via INTRAVENOUS

## 2016-08-30 MED ORDER — FAMOTIDINE 20 MG PO TABS
20.0000 mg | ORAL_TABLET | Freq: Every day | ORAL | Status: DC
  Administered 2016-08-30 – 2016-09-01 (×3): 20 mg via ORAL
  Filled 2016-08-30 (×3): qty 1

## 2016-08-30 MED ORDER — LISINOPRIL 10 MG PO TABS
10.0000 mg | ORAL_TABLET | Freq: Every day | ORAL | Status: DC
  Administered 2016-08-30 – 2016-09-01 (×3): 10 mg via ORAL
  Filled 2016-08-30 (×3): qty 1

## 2016-08-30 MED ORDER — POLYETHYLENE GLYCOL 3350 17 G PO PACK: 17.0000 g | PACK | Freq: Every day | ORAL | Status: DC | PRN

## 2016-08-30 MED ORDER — BUDESONIDE 0.25 MG/2ML IN SUSP
0.2500 mg | Freq: Two times a day (BID) | RESPIRATORY_TRACT | Status: DC
  Administered 2016-08-30: 0.25 mg via RESPIRATORY_TRACT
  Filled 2016-08-30: qty 2

## 2016-08-30 MED ORDER — HEPARIN SODIUM (PORCINE) 5000 UNIT/ML IJ SOLN
5000.0000 [IU] | Freq: Three times a day (TID) | INTRAMUSCULAR | Status: DC
  Administered 2016-08-30 – 2016-09-01 (×5): 5000 [IU] via SUBCUTANEOUS
  Filled 2016-08-30 (×5): qty 1

## 2016-08-30 MED ORDER — METOPROLOL TARTRATE 25 MG PO TABS
25.0000 mg | ORAL_TABLET | Freq: Two times a day (BID) | ORAL | Status: DC
  Administered 2016-08-30 – 2016-09-01 (×4): 25 mg via ORAL
  Filled 2016-08-30 (×5): qty 1

## 2016-08-30 MED ORDER — ARFORMOTEROL TARTRATE 15 MCG/2ML IN NEBU
15.0000 ug | INHALATION_SOLUTION | Freq: Two times a day (BID) | RESPIRATORY_TRACT | Status: DC
  Administered 2016-08-30 – 2016-09-01 (×4): 15 ug via RESPIRATORY_TRACT
  Filled 2016-08-30 (×4): qty 2

## 2016-08-30 MED ORDER — ACETAMINOPHEN 650 MG RE SUPP: 650.0000 mg | Freq: Four times a day (QID) | RECTAL | Status: DC | PRN

## 2016-08-30 MED ORDER — FUROSEMIDE 40 MG PO TABS
40.0000 mg | ORAL_TABLET | Freq: Every day | ORAL | Status: DC
  Administered 2016-08-31 – 2016-09-01 (×2): 40 mg via ORAL
  Filled 2016-08-30 (×3): qty 1

## 2016-08-30 MED ORDER — SODIUM CHLORIDE 0.9 % IV SOLN: 250.0000 mL | INTRAVENOUS | Status: DC | PRN

## 2016-08-30 MED ORDER — PRAVASTATIN SODIUM 20 MG PO TABS
20.0000 mg | ORAL_TABLET | Freq: Every day | ORAL | Status: DC
  Administered 2016-08-30 – 2016-08-31 (×2): 20 mg via ORAL
  Filled 2016-08-30 (×2): qty 1

## 2016-08-30 MED ORDER — ONDANSETRON HCL 4 MG/2ML IJ SOLN: 4.0000 mg | Freq: Four times a day (QID) | INTRAMUSCULAR | Status: DC | PRN

## 2016-08-30 MED ORDER — ASPIRIN EC 81 MG PO TBEC
81.0000 mg | DELAYED_RELEASE_TABLET | Freq: Every day | ORAL | Status: DC
  Administered 2016-08-30 – 2016-09-01 (×3): 81 mg via ORAL
  Filled 2016-08-30 (×3): qty 1

## 2016-08-30 MED ORDER — BUDESONIDE 0.25 MG/2ML IN SUSP
0.5000 mg | Freq: Two times a day (BID) | RESPIRATORY_TRACT | Status: DC
  Filled 2016-08-30 (×2): qty 4

## 2016-08-30 MED ORDER — ENOXAPARIN SODIUM 40 MG/0.4ML ~~LOC~~ SOLN: 40.0000 mg | SUBCUTANEOUS | Status: DC

## 2016-08-30 MED ORDER — ONDANSETRON HCL 4 MG PO TABS: 4.0000 mg | ORAL_TABLET | Freq: Four times a day (QID) | ORAL | Status: DC | PRN

## 2016-08-30 NOTE — ED Triage Notes (Signed)
Pt from home with c/o SOB starting today.  Pt was found with a HR of 160 and BP 140/100.  Given 6 mg adenosine and then 12 mg adenosine with no change.  Given 20 mg Cardizem with a responding HR of 70 and remained NSR.  Pt has defibrillator which did not fire.  Uses home O2 as needed.  Pt in NAD, A&O.

## 2016-08-30 NOTE — Consult Note (Signed)
Name: Jesus Sanders MRN: [id number] DOB: 01-Feb-1936    ADMISSION DATE:  08/30/2016 CONSULTATION DATE:  08/30/16  REFERRING MD :  McDiarmid  CHIEF COMPLAINT:  SOB   HISTORY OF PRESENT ILLNESS:  Jesus Sanders is a 80 y.o. male with a PMH as outlined below.  He presented to [facility] ED 10/24 with SOB.  He apparently woke up with SOB at [facility] and stayed in bed hoping it would resolve.  He later got up and tried ambulating in hopes that this would improve his symptoms; however, it did not.  He did not have any fevers/chills/sweats, headaches, chest pain, cough, N/V/D, abd pain, myalgias, weight gain, LE edema.  On EMS arrival he was found to be in SVT.  He was given '6mg'$  adenosine followed by '12mg'$  without resolution.  He then received '20mg'$  Cardizem which converted rhythm to NSR.  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  He was supposed to start eliquis but pt doesn't recall ever taking it.  He sees Dr. [name] as outpatient for COPD (PFT's from May 2015 with FEV1 0.90, 27% and ratio 38 pre/37 post).  He was admitted by [facility] for acute on chronic hypoxic respiratory failure felt to be due to arrhythmia vs AECOPD.  Symptoms felt less likely to be due to chronic right PA thrombus.  On admission, he had CTA which had incidental finding of LUL 4.2cm spiculated mass.  PCCM was therefore consulted for further recs.  Pt is a former long time smoker (30 pack / year hx).  He has not had any hemoptysis, no weight loss.  PAST MEDICAL HISTORY :   has a past medical history of AAA (abdominal aortic aneurysm) ([place]); CAD (coronary artery disease); Chronic bronchitis; Chronic respiratory failure ([place]); COPD (chronic obstructive pulmonary disease) ([place]); Dilatation of thoracic aorta ([place]); Duodenal perforation [facility]) (June 2012); GERD (gastroesophageal reflux disease); History of DVT (deep vein thrombosis);  Hypertension; Ischemic cardiomyopathy; Myocardial infarction; Peritonitis [facility]) (June 2012); Pneumonia (April 2012); Polycythemia vera(238.4); 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  has a past surgical history that includes Cholecystectomy (03/2011); Colon surgery; Cardiac catheterization (Sept 2012); US ECHOCARDIOGRAPHY (Sept 2012); Cardiac defibrillator placement; Shoulder arthroscopy; implantable cardioverter defibrillator implant (N/A, 01/11/2012); and Cardiac catheterization (N/A, 08/05/2015). Prior to Admission medications   Medication Sig Start Date End Date Taking? Authorizing Provider  BROVANA 15 MCG/2ML NEBU USE ONE VIAL IN NEBULIZER TWICE DAILY 08/04/15  Yes Tanda Rockers, MD  budesonide (PULMICORT) 0.25 MG/2ML nebulizer solution USE ONE VIAL IN NEBULIZER TWICE DAILY 08/04/15  Yes Tanda Rockers, MD  furosemide (LASIX) 40 MG tablet Take 40 mg by mouth daily.   Yes Historical Provider, MD  lisinopril (PRINIVIL,ZESTRIL) 20 MG tablet Take 10 mg by mouth daily.   Yes Historical Provider, MD  lovastatin (MEVACOR) 20 MG tablet Take 20 mg by mouth at bedtime.   Yes Historical Provider, MD  metoprolol (LOPRESSOR) 50 MG tablet Take 25 mg by mouth 2 (two) times daily.   Yes Historical Provider, MD  ranitidine (ZANTAC) 150 MG tablet Take 150 mg by mouth daily.   Yes Historical Provider, MD   No Known Allergies  FAMILY HISTORY:  family history includes Lung disease in his father. SOCIAL HISTORY:  reports that he quit smoking about 3 years ago. His smoking use included Cigars. He has  a 30.00 pack-year smoking history. His smokeless tobacco use includes Chew. He reports that he drinks alcohol. He reports that he does not use drugs.  REVIEW OF SYSTEMS:   All negative; except for those that are bolded, which indicate positives.  Constitutional: weight loss, weight gain, night sweats, fevers, chills,  fatigue, weakness.  HEENT: headaches, sore throat, sneezing, nasal congestion, post nasal drip, difficulty swallowing, tooth/dental problems, visual complaints, visual changes, ear aches. Neuro: difficulty with speech, weakness, numbness, ataxia. CV:  chest pain, orthopnea, PND, swelling in lower extremities, dizziness, palpitations, syncope.  Resp: cough, hemoptysis, dyspnea, wheezing. GI: heartburn, indigestion, abdominal pain, nausea, vomiting, diarrhea, constipation, change in bowel habits, loss of appetite, hematemesis, melena, hematochezia.  GU: dysuria, change in color of urine, urgency or frequency, flank pain, hematuria. MSK: joint pain or swelling, decreased range of motion. Psych: change in mood or affect, depression, anxiety, suicidal ideations, homicidal ideations. Skin: rash, itching, bruising.    SUBJECTIVE:  No complaints at this time, resting comfortably.  Denies chest pain, SOB.  VITAL SIGNS: Temp:  [97.6 F (36.4 C)-98.4 F (36.9 C)] 98.4 F (36.9 C) (10/24 1946) Pulse Rate:  [68-71] 68 (10/24 1946) Resp:  [20-23] 20 (10/24 1946) BP: (114-133)/(62-77) 114/62 (10/24 1946) SpO2:  [89 %-95 %] 93 % (10/24 1946) Weight:  [210 lb 14.4 oz (95.7 kg)-228 lb (103.4 kg)] 210 lb 14.4 oz (95.7 kg) (10/24 1638)  PHYSICAL EXAMINATION: General: Elderly male, resting in bed, in NAD. Neuro: A&O x 3, non-focal.  HEENT: [place]/AT. PERRL, sclerae anicteric. Cardiovascular: RRR, no M/R/G.  Lungs: Respirations even and unlabored.  CTA bilaterally, No W/R/R. Abdomen: BS x 4, soft, NT/ND.  Musculoskeletal: No gross deformities, 1+ non-pitting edema.  Skin: Intact, warm, no rashes.    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 This is unchanged from 02/21/2014 and 07/21/2015. Electronically Signed   By: Franchot Gallo M.D.   On: 08/30/2016 14:48   Result Date: 08/30/2016 CLINICAL DATA:  Short of breath. Hypoxemia. COPD. Abnormal chest x-ray. EXAM: CT ANGIOGRAPHY CHEST WITH CONTRAST TECHNIQUE: Multidetector CT imaging of the chest was performed using the standard protocol during bolus administration of intravenous contrast. Multiplanar CT image reconstructions and MIPs were obtained to evaluate the vascular anatomy. CONTRAST:  100 mL Isovue 370 IV COMPARISON:  CT 07/21/2015, 02/21/2014 FINDINGS: Cardiovascular: Intraluminal filling defect in the right main pulmonary artery inferiorly has progressed slightly since the prior studies. This measures approximately 6 x 2.5 cm. This is smoothly marginated and appears to be chronic thrombus. This has grown somewhat over time. No evidence of acute pulmonary embolism. Pulmonary arteries mildly enlarged bilaterally compatible with pulmonary artery hypertension and COPD. Limited opacification of the thoracic aorta which is atherosclerotic. No aneurysm. Extensive coronary calcification. Low-density thrombus in the left ventricular apex with associated left ventricular apex aneurysm. Numerous dilated venous collaterals in the right anterior chest suggesting central venous stenosis. Left arm injection for contrast. Left innominate vein and subclavian vein patent.  Left-sided transvenous pacemaker noted. Mediastinum/Nodes: Shotty nodes in the mediastinum. No pathologic adenopathy. Lungs/Pleura: Spiculated mass left upper lobe measuring 4.2 x 4.2 cm compatible with carcinoma of the lung. This corresponds to the abnormal chest x-ray finding. Extensive pleural calcification in the right posterior lung base similar to prior studies. There  is right lower lobe parenchymal density unchanged extending to pleural surface. Probable rounded atelectasis. Possible chronic pulmonary infarction from pulmonary embolism in the past. COPD with apical emphysema. Pulmonary hyperinflation. No significant pleural effusion. Upper Abdomen: Negative Musculoskeletal: No acute skeletal abnormality. Review of the MIP images confirms the above findings. IMPRESSION: 4.2 cm spiculated mass left upper lobe compatible with carcinoma of the lung. No definite mediastinal or hilar adenopathy. Filling defect in the right pulmonary artery slightly larger compared with prior studies. This is most compatible with chronic thrombus. Intravascular neoplasm not considered likely given the smooth margins and rarity of this diagnosis. Pleural calcification right lung base with rounded atelectasis in the right lung base. Findings compatible with extensive scarring possibly due to prior right lower lobe pulmonary infarction for pulmonary embolism. Electronically Signed: By: Franchot Gallo M.D. On: 08/30/2016 14:04   Dg Chest Port 1 View  Result Date: 08/30/2016 CLINICAL DATA:  Shortness of Breath.  COPD. EXAM: PORTABLE CHEST 1 VIEW COMPARISON:  01/27/2016 FINDINGS: There is hyperinflation of the lungs compatible with COPD. Left pacer remains in place, unchanged. Enlargement of the left hilum concerning for left hilar or perihilar mass. Small bilateral effusions. Right base atelectasis or infiltrate. Heart is normal size. IMPRESSION: COPD. Left hilar enlargement concerning for possible mass. Recommend further evaluation with chest CT with IV contrast. Small bilateral effusions.  Right base atelectasis or infiltrate. Electronically Signed   By: Rolm Baptise M.D.   On: 08/30/2016 10:50    STUDIES:  CTA chest 10/24 > 4.2cm spiculated LUL mass c/w carcinoma of the lung.  Chronic right pulmonary artery thrombus.  Rounded atx right base.  SIGNIFICANT EVENTS  10/24 > admitted with acute on  chronic hypoxic respiratory failure.  ASSESSMENT / PLAN:  LUL lung mass - concerning for malignancy. Plan: Given his degree of COPD (and other co-morbidities including combined heart failure with EF 44-01%), he is certainly at increased risk for general anesthesia / mechanical ventilation.  That said, he appears comfortable on room air tonight and seems to be recovering from acute illness well. Pt still unsure if he would even want treatment if this mass does represent malignancy (asked several times what his outlook would look like without any treatment, etc).  He is wanting to discuss options with family first before making a decision on whether to proceed with biopsy or not. If pt does decide to proceed, perhaps safest route for biopsy may be CT guided by IR. If IR unable to perform above, can consider EBUS / ENB (if pt willing to accept risks of anesthesia), otherwise could also possibly pursue empiric XRT without biopsy. Await pt to discuss with family - PCCM will follow up on these discussions and be available for questions, etc.  Acute on chronic hypoxic respiratory failure - likely combination of AECOPD, combined CHF, arrhythmia (SVT). Plan: Continue supplemental O2 as needed to maintain SpO2 88-92%. Continue budesonide / brovana (budesonide dose adjusted). DuoNebs PRN.  Rest per primary team.   Montey Hora, PA - [facility] Pager: [phone]  or [phone] 08/30/2016, 11:02 PM  Attending Note:  I have examined patient, reviewed  labs, studies and notes. I have discussed the case with [place], and I agree with the data and plans as amended above.   80 yo man, followed by Dr [name] for severe COPD. Also w a hx CAD, chronic (large) R PA PE. Admitted with SVT. CT chest identified a new LUL mass with some associated shotty mediastinal LAD, very suspicious for a primary lung CA. Had a good discussion with him last night about the various  options here, ranging from aggressive pursuit of bx and tissue dx all the way to watchful waiting and doing nothing or even empiric XRT without a bx. He asked the insightful question about what would happen if he did nothing. I explained that people w untreated lung CA often die or at least have sx in 6 months. If we are to pursue tissue dx, then ENB would be most sensitive test, but carries the risk of general anesthesia. Other options include TT needle bx, standard FOB under conscious sedation. Could also discuss sterotactic XRT with rad/Onc depending on how discussions go. He wants to have a discussion of the options with his two daughters. We will try to arrange.   Baltazar Apo, MD, PhD 08/31/2016, 8:39 AM [facility] [phone] or if no answer [phone]

## 2016-08-30 NOTE — ED Notes (Signed)
Called 3E to give report. Receiving RN unable to take report at this time

## 2016-08-30 NOTE — H&P (Signed)
[facility] Admission History and Physical Service Pager: [phone]  Patient name: Jesus Sanders Medical record number: [id number] Date of birth: [birth year] Age: 80 y.o. Gender: male  Primary Care Provider: Leonard Downing, MD Consultants: Cardiology Code Status: Full code  Chief Complaint: dyspnea  Assessment and Plan: Jesus Sanders is a 80 y.o. male presenting with acute shortness of breath. PMH is significant for Severe COPD, CHF, AAA, CAD w/ stent in 2012 for STEMI, polycythemia vera, popliteal aneurysm, Chronic PE, DVT, CVA   Acute Dyspnea: Likely secondary to an arrhythmia vs COPD exacerbation vs worsening chronic pulmonary thrombus. No signs of fluid overload and BNP 78.5, unlikely CHF exacerbation. Was noted to have a tachyarrythmia per EMS, possibly SVT. Implantable cardiac defibrillator did not fire. S/p Adenosine x 2 and Cardizem x 1. Of note patient has a chronic pulmonary thrombus, unlikely to cause acute dyspnea. Dyspnea improved now. Vitals on admission normal with no hypoxia. Physical exam with normal work of breathing and lungs clear to auscultation bilaterally. ABG normal. BMP unremarkable. Plt slightly low at 125. Troponin 0.00. EKG showing Sinus rhythm with Left anterior fascicular block and abnormal T waves (unchanged from previous). CXR showing COPD changes, Left hilar enlargement concerning for possible mass, small bilateral effusions and right base atelectasis or infiltrate. CTA showing 4.2 cm spiculated mass left upper lobe compatible with carcinoma of the lung, no definite mediastinal or hilar adenopathy, right pulmonary artery chronic thrombus slightly larger than before, and pleural calcification right lung base with rounded atelectasis in the right lung base. Findings compatible with extensive scarring possibly due to prior right lower lobe pulmonary infarction for pulmonary embolism. - Admit to telemetry, attending Dr. McDiarmid -  Vital signs per floor protocol - Continuous cardiac monitoring - Cardiology consulted, appreciate their assistance - Trend troponins - EKG in AM - Echocardiogram - PRN Duonebs - O2 monitoring - Supplemental O2 for O2 < 88% - AM BMP and CBC  Suspicious lung mass: CTA showing 4.2 cm spiculated mass left upper lobe compatible with carcinoma of the lung. Patient denies any hemoptysis or weight loss. Does have a history of cigarette smoking. Discussed mass with CVTS who stated they would defer to pulmonology.  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 Seen on CTA today and noted to be slightly larger than previous. Patient was placed on Eliquis but stopped taking chronic anticoagulation, the timeline on when the anticoagulation was started and stopped is unclear and patient does not remember being on the medication.  - IV Heparin for now - Will need long term anticoagulation prior to discharge  Severe COPD: Home medications include Brovana and Pulmicort. Patient has no O2 requirement at home but does state he uses O2 PRN.  - Resume home medications - PRN Duonebs as above - Supplemental O2 if O2 sats below 88%  HFrEF with AICD in place: 12/2011 Echo: EF 25-30%, mid-dist antsept/inf, apical AK, Gr 1 DD, Triv AI, Mild MR. AICD placed in 2013, has never fired per patient.  - Cardiology consulted as above.  - Continue home Metoprolol tartrate 25 mg BID - Continue home Lisinopril 10 mg daily - Continue home Lasix 40 mg daily - Daily weights - I/Os - Echo as above  Other cardiac problems: HTN, HLD, CAD w/ stent in 2012 for STEMI.  - Resume home medications - Add ASA 81 mg daily - Cardiology  consulted as above  GERD:  - Pepcid 20 mg daily  Polycythemia Vera: Hgb stable at 16.8 - Daily CBC  FEN/GI: SLIV, heart healthy diet Prophylaxis:  Heparin  Disposition: Admit to telemetry, attending Dr. McDiarmid  History of Present Illness:  Jesus Sanders is a 80 y.o. male presenting with shortness of breath  Patient states that this morning he woke up with shortness of breath at 5 AM. He laid in bed until (AM thinking that it may go away. Denied ever having any chest pain, discomfort, palpitations, hemoptysis or increased cough. No weight change.Patient tried to get up and move around but it didn't change his shortness of breath. Patient's daughter then called the ambulance. In the ambulance he was found to be in SVT. He was given 6 mg of adenosine and then 12 mg of adenosine. SVT persisted and was given 20 mg Cardizem which converted him to NSR. His defibrillator did not fire. His shortness of breath got better when he arrived to the ED.   Of note, patient takes oxygen as needed, only when exerting himself. No CPAP machine at night. Patient states he is not on any blood thinners currently, cannot recall ever taking Eliquis. His does have orthopnea at home and some intermittent productive cough. Denies any weight change or lower extremity swelling. Denies any recent illness or fever. States that his breathing feels ok now.   Review Of Systems: Per HPI with the following additions: see HPI  ROS  Patient Active Problem List   Diagnosis Date Noted  . Lung mass 08/30/2016  . COPD exacerbation ([place]) 07/19/2015  . Acute respiratory failure ([place]) 07/19/2015  . Dyspnea 02/17/2015  . Chronic respiratory failure with hypoxia ([place]) 02/17/2015  . COPD GOLD  IV 05/17/2014  . Acute on chronic systolic CHF (congestive heart failure) ([place]) 05/16/2013  . Environmental allergies 04/13/2012  . Automatic implantable cardioverter-defibrillator- medtronic 04/11/2012  . Hyperlipidemia 02/27/2012  . GERD (gastroesophageal reflux disease) 01/12/2012  . Ischemic cardiomyopathy 01/04/2012  . HTN (hypertension) 09/06/2011  . Ecchymosis 08/16/2011  . CAD  (coronary artery disease) 08/02/2011  . Chronic systolic heart failure ([place]) 08/02/2011  . Acute stomach ulcer 07/07/2011  . Duodenal perforation ([place]) 06/23/2011  . Bruises easily 05/03/2011  . Polycythemia vera ([place]) 03/11/2011    Past Medical History: Past Medical History:  Diagnosis Date  . AAA (abdominal aortic aneurysm) ([place])    a. CT 07/2015 - mild aneurysmal dilatation of the distal abdominal aorta measuring 3.2 x 3.4 cm.  Marland Kitchen CAD (coronary artery disease)    a. 07/2011 Anterior apical STEMI/Cath/PCI: LM nl, LAD 100p/m (2.5 x 5m Mini-Vision BMS), LCX 40p, RCA dominant, nl, EF 30%.  . Chronic bronchitis   . Chronic respiratory failure ([place]   . COPD (chronic obstructive pulmonary disease) ([place]   . Dilatation of thoracic aorta ([place]    a. CT 07/2015 - aneurysmal dilatation of the descending thoracic aorta measuring 3.9 cm in greatest diameter.  . Duodenal perforation ([facility] June 2012  . GERD (gastroesophageal reflux disease)   . History of DVT (deep vein thrombosis)   . Hypertension   . Ischemic cardiomyopathy    a. 01/2012 S/P MDT Protecta single lead ICD, ser # [id number]  . Myocardial infarction   . Peritonitis ([facility] June 2012  . Pneumonia April 2012  . Polycythemia vera(238.4)    a. Used to [place]botomies [place] [facility] [place]  will restart his phlebotomies from about Mar 15 2011  . Popliteal  aneurysm ([place])   . Pulmonary embolism ([place])    a. Diagnosed 07/2015 - placed on Eliquis.  . Shortness of breath   . Stomach ulcer   . Systolic CHF, chronic ([place])    a. 12/2011 Echo: EF 25-30%, mid-dist antsept/inf, apical AK, Gr 1 DD, Triv AI, Mild MR.  . Tobacco abuse    a. cigars    Past Surgical History: Past Surgical History:  Procedure Laterality Date  . CARDIAC CATHETERIZATION  Sept 2012   Normal left main, occluded LAD, 40% LCX and normal RCA. EF is 30%  . CARDIAC DEFIBRILLATOR PLACEMENT     single chamber  . CHOLECYSTECTOMY  03/2011    Dr. Marlou Starks  . COLON SURGERY    . IMPLANTABLE CARDIOVERTER DEFIBRILLATOR IMPLANT N/A 01/11/2012   Procedure: IMPLANTABLE CARDIOVERTER DEFIBRILLATOR IMPLANT;  Surgeon: Deboraha Sprang, MD;  Location: [facility] CATH LAB;  Service: Cardiovascular;  Laterality: N/A; Medtronic  . PERIPHERAL VASCULAR CATHETERIZATION N/A 08/05/2015   Procedure: Lower Extremity Angiography;  Surgeon: Serafina Mitchell, MD;  Location: [place] CV LAB;  Service: Cardiovascular;  Laterality: N/A;  . SHOULDER ARTHROSCOPY     left, rotatotor cuff tendinopathy  . US ECHOCARDIOGRAPHY  Sept 2012   EF 25 to 30% with akinesis of the mid to distal anterior and apical myocardium, trivial AI and no apical thrombus    Social History: Social History  Substance Use Topics  . Smoking status: Former Smoker    Packs/day: 0.50    Years: 60.00    Types: Cigars    Quit date: 05/18/2013  . Smokeless tobacco: Current User    Types: Chew  . Alcohol use Yes     Comment: rarely    Additional social history: Chews tobacco, last time he smoked a cigarette was 5 years ago.  Please also refer to relevant sections of EMR.  Family History: Family History  Problem Relation Age of Onset  . Lung disease Father     [name]- worked at a [employer]     Allergies and Medications: No Known Allergies No current facility-administered medications on file prior to encounter.    Current Outpatient Prescriptions on File Prior to Encounter  Medication Sig Dispense Refill  . BROVANA 15 MCG/2ML NEBU USE ONE VIAL IN NEBULIZER TWICE DAILY 120 mL 11  . budesonide (PULMICORT) 0.25 MG/2ML nebulizer solution USE ONE VIAL IN NEBULIZER TWICE DAILY 120 mL 11    Objective: BP 127/65 (BP Location: Right Arm)   Pulse 71   Temp 97.6 F (36.4 C) (Oral)   Resp (!) 22   Ht 6' (1.829 m)   Wt 210 lb 14.4 oz (95.7 kg)   SpO2 93%   BMI 28.60 kg/m  Exam: General: In NAD, elderly gentleman sitting up in bed, pleasant and conversive Eyes: PERRLA ENTM: non icteric  sclera, no nasal discharge, moist mucous membranes Neck: supple, no JVD Cardiovascular: regular rate and rhythm, no murmurs appreciated, no edema, 1+ DP pulses bilaterally Respiratory: normal work of breathing, no wheezing, rhonchi or crackles appreciated Gastrointestinal: horseshoe shaped scar across entire abdomen, soft, non distended, non tender, normal bowel sounds MSK: normal range of motion of all extremities Derm: Telangiectasias across entire body most prominent on cheeks, chest, abdomen, and back Neuro: alert and oriented x3, 5/5 strength in bilateral upper and lower extremities, normal sensation throughout Psych: normal mood and affect   Labs and Imaging: CBC BMET   Recent Labs Lab 08/30/16 1055 08/30/16 1127  WBC 7.8  --  HGB 16.8 18.4*  HCT 54.1* 54.0*  PLT 125*  --     Recent Labs Lab 08/30/16 1055 08/30/16 1127  NA 141 141  K 5.1 5.1  CL 109 107  CO2 26  --   BUN 14 21*  CREATININE 1.20 1.20  GLUCOSE 105* 102*  CALCIUM 9.2  --      Ct Angio Chest Pe W And/or Wo Contrast  Addendum Date: 08/30/2016   ADDENDUM REPORT: 08/30/2016 14:48 ADDENDUM: Addition to the impression: 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 This is unchanged from 02/21/2014 and 07/21/2015. Electronically Signed   By: Franchot Gallo M.D.   On: 08/30/2016 14:48   Result Date: 08/30/2016 CLINICAL DATA:  Short of breath. Hypoxemia. COPD. Abnormal chest x-ray. EXAM: CT ANGIOGRAPHY CHEST WITH CONTRAST TECHNIQUE: Multidetector CT imaging of the chest was performed using the standard protocol during bolus administration of intravenous contrast. Multiplanar CT image reconstructions and MIPs were obtained to evaluate the vascular anatomy. CONTRAST:  100 mL Isovue 370 IV COMPARISON:  CT 07/21/2015, 02/21/2014 FINDINGS: Cardiovascular: Intraluminal filling defect in the right main pulmonary artery inferiorly has progressed slightly since the prior studies. This measures approximately 6  x 2.5 cm. This is smoothly marginated and appears to be chronic thrombus. This has grown somewhat over time. No evidence of acute pulmonary embolism. Pulmonary arteries mildly enlarged bilaterally compatible with pulmonary artery hypertension and COPD. Limited opacification of the thoracic aorta which is atherosclerotic. No aneurysm. Extensive coronary calcification. Low-density thrombus in the left ventricular apex with associated left ventricular apex aneurysm. Numerous dilated venous collaterals in the right anterior chest suggesting central venous stenosis. Left arm injection for contrast. Left innominate vein and subclavian vein patent. Left-sided transvenous pacemaker noted. Mediastinum/Nodes: Shotty nodes in the mediastinum. No pathologic adenopathy. Lungs/Pleura: Spiculated mass left upper lobe measuring 4.2 x 4.2 cm compatible with carcinoma of the lung. This corresponds to the abnormal chest x-ray finding. Extensive pleural calcification in the right posterior lung base similar to prior studies. There is right lower lobe parenchymal density unchanged extending to pleural surface. Probable rounded atelectasis. Possible chronic pulmonary infarction from pulmonary embolism in the past. COPD with apical emphysema. Pulmonary hyperinflation. No significant pleural effusion. Upper Abdomen: Negative Musculoskeletal: No acute skeletal abnormality. Review of the MIP images confirms the above findings. IMPRESSION: 4.2 cm spiculated mass left upper lobe compatible with carcinoma of the lung. No definite mediastinal or hilar adenopathy. Filling defect in the right pulmonary artery slightly larger compared with prior studies. This is most compatible with chronic thrombus. Intravascular neoplasm not considered likely given the smooth margins and rarity of this diagnosis. Pleural calcification right lung base with rounded atelectasis in the right lung base. Findings compatible with extensive scarring possibly due to prior  right lower lobe pulmonary infarction for pulmonary embolism. Electronically Signed: By: Franchot Gallo M.D. On: 08/30/2016 14:04   Dg Chest Port 1 View  Result Date: 08/30/2016 CLINICAL DATA:  Shortness of Breath.  COPD. EXAM: PORTABLE CHEST 1 VIEW COMPARISON:  01/27/2016 FINDINGS: There is hyperinflation of the lungs compatible with COPD. Left pacer remains in place, unchanged. Enlargement of the left hilum concerning for left hilar or perihilar mass. Small bilateral effusions. Right base atelectasis or infiltrate. Heart is normal size. IMPRESSION: COPD. Left hilar enlargement concerning for possible mass. Recommend further evaluation with chest CT with IV contrast. Small bilateral effusions.  Right base atelectasis or infiltrate. Electronically Signed   By: Rolm Baptise M.D.   On: 08/30/2016 10:50  Carlyle Dolly, MD 08/30/2016, 5:54 PM PGY-2, [place] [facility] pager: [phone], text pages welcome

## 2016-08-30 NOTE — Progress Notes (Signed)
Dr. Mingo Amber notified of pt's arrival to floor.  Verbalized he is aware.  Karie Kirks, Therapist, sports.

## 2016-08-30 NOTE — Progress Notes (Signed)
Pt arrived to floor via stretcher.  Oriented to surrounding.  Call bell at reach.  Instructed to call for assistance as needed.  Will continue to monitor.  Karie Kirks, Therapist, sports.

## 2016-08-30 NOTE — ED Provider Notes (Signed)
[facility] DEPT Provider Note   CSN: [id number] Arrival date & time: 08/30/16  1018     History   Chief Complaint Chief Complaint  Patient presents with  . Shortness of Breath    HPI Jesus Sanders is a 80 y.o. male  Who  has a past medical history of AAA (abdominal aortic aneurysm) ([place]); CAD (coronary artery disease); Chronic bronchitis; Chronic respiratory failure ([place]); COPD (chronic obstructive pulmonary disease) ([place]); Dilatation of thoracic aorta ([place]); Duodenal perforation [facility]) (June 2012); GERD (gastroesophageal reflux disease); History of DVT (deep vein thrombosis); Hypertension; Ischemic cardiomyopathy; Myocardial infarction; Peritonitis [facility]) (June 2012); Pneumonia (April 2012); Polycythemia vera(238.4); Popliteal aneurysm ([place]); Pulmonary embolism ([place]); Shortness of breath; Stomach ulcer; Systolic CHF, chronic ([place]); and Tobacco abuse. Patient presents with SOB. Hx Is given by EMS and the patient. I have reviewed electronic medical records. Patient was found by EMS to be short of breath with a heart rate in the 160s. He seemed to have SVT and given adenosine 6, then it 12 without resolution. Patient was given Cardizem and he resolved. He has an implantable cardiac defibrillator did not fire. He does not know what type. He has. The patient states that he is feeling almost back to baseline except that he feels very short of breath still. His oxygen saturations were found to be in the 80s and he states that she is not on chronic oxygen, but has it, "if I needed at home." He denies any recent URI symptoms, recent fevers, urinary symptoms, other signs of infection.  HPI  Past Medical History:  Diagnosis Date  . AAA (abdominal aortic aneurysm) ([place])    a. CT 07/2015 - mild aneurysmal dilatation of the distal abdominal aorta measuring 3.2 x 3.4 cm.  Marland Kitchen CAD (coronary artery disease)    a. 07/2011 Anterior apical STEMI/Cath/PCI: LM nl, LAD 100p/m (2.5 x 36m Mini-Vision BMS), LCX  40p, RCA dominant, nl, EF 30%.  . Chronic bronchitis   . Chronic respiratory failure ([place]   . COPD (chronic obstructive pulmonary disease) ([place]   . Dilatation of thoracic aorta ([place]    a. CT 07/2015 - aneurysmal dilatation of the descending thoracic aorta measuring 3.9 cm in greatest diameter.  . Duodenal perforation ([facility] June 2012  . GERD (gastroesophageal reflux disease)   . History of DVT (deep vein thrombosis)   . Hypertension   . Ischemic cardiomyopathy    a. 01/2012 S/P MDT Protecta single lead ICD, ser # [id number]  . Myocardial infarction   . Peritonitis ([facility] June 2012  . Pneumonia April 2012  . Polycythemia vera(238.4)    a. [facility]  will restart his phlebotomies from about Mar 15 2011  . Popliteal aneurysm ([place]   . Pulmonary embolism ([place]    a. Diagnosed 07/2015 - placed on Eliquis.  . Shortness of breath   . Stomach ulcer   . Systolic CHF, chronic ([place]    a. 12/2011 Echo: EF 25-30%, mid-dist antsept/inf, apical AK, Gr 1 DD, Triv AI, Mild MR.  . Tobacco abuse    a. cigars    Patient Active Problem List   Diagnosis Date Noted  . Thrombus of pulmonary vein ([place]   . Supraventricular dysrhythmia   . Acute on chronic respiratory failure with hypoxia ([place]   . Lung mass 08/30/2016  . 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 COPD exacerbation ([place] 07/19/2015  . Acute respiratory failure ([place] 07/19/2015  .  Dyspnea 02/17/2015  . Acute on chronic respiratory failure with hypoxemia ([place]) 02/17/2015  . COPD GOLD  IV 05/17/2014  . Acute on chronic systolic CHF (congestive heart failure) ([place]) 05/16/2013  . Environmental allergies 04/13/2012  . Automatic implantable cardioverter-defibrillator- medtronic 04/11/2012  . Hyperlipidemia 02/27/2012  . GERD (gastroesophageal reflux disease) 01/12/2012  . Ischemic cardiomyopathy 01/04/2012  . HTN (hypertension) 09/06/2011  .  Ecchymosis 08/16/2011  . CAD (coronary artery disease) 08/02/2011  . Chronic systolic heart failure ([place]) 08/02/2011  . Acute stomach ulcer 07/07/2011  . Duodenal perforation ([place]) 06/23/2011  . Bruises easily 05/03/2011  . Polycythemia vera ([place]) 03/11/2011    Past Surgical History:  Procedure Laterality Date  . CARDIAC CATHETERIZATION  Sept 2012   Normal left main, occluded LAD, 40% LCX and normal RCA. EF is 30%  . CARDIAC DEFIBRILLATOR PLACEMENT     single chamber  . CHOLECYSTECTOMY  03/2011   Dr. Marlou Starks  . COLON SURGERY    . IMPLANTABLE CARDIOVERTER DEFIBRILLATOR IMPLANT N/A 01/11/2012   Procedure: IMPLANTABLE CARDIOVERTER DEFIBRILLATOR IMPLANT;  Surgeon: Deboraha Sprang, MD;  [facility] C[facility] LAB;  Service: Cardiovascular;  Laterality: N/A; Medtronic  . PERIPHERAL VASCULAR CATHETERIZATION N/A 08/05/2015   Procedure: Lower Extremity Angiography;  Surgeon: Serafina Mitchell, MD;  Location: [facility] CV LAB;  Service: Cardiovascular;  Laterality: N/A;  . SHOULDER ARTHROSCOPY     left, rotatotor cuff tendinopathy  . US ECHOCARDIOGRAPHY  Sept 2012   EF 25 to 30% with akinesis of the mid to distal anterior and apical myocardium, trivial AI and no apical thrombus       Home Medications    Prior to Admission medications   Medication Sig Start Date End Date Taking? Authorizing Provider  BROVANA 15 MCG/2ML NEBU USE ONE VIAL IN NEBULIZER TWICE DAILY 08/04/15  Yes Tanda Rockers, MD  budesonide (PULMICORT) 0.25 MG/2ML nebulizer solution USE ONE VIAL IN NEBULIZER TWICE DAILY 08/04/15  Yes Tanda Rockers, MD  furosemide (LASIX) 40 MG tablet Take 40 mg by mouth daily.   Yes Historical Provider, MD  lisinopril (PRINIVIL,ZESTRIL) 20 MG tablet Take 10 mg by mouth daily.   Yes Historical Provider, MD  lovastatin (MEVACOR) 20 MG tablet Take 20 mg by mouth at bedtime.   Yes Historical Provider, MD  metoprolol (LOPRESSOR) 50 MG tablet Take 25 mg by mouth 2 (two) times daily.   Yes Historical  Provider, MD  ranitidine (ZANTAC) 150 MG tablet Take 150 mg by mouth daily.   Yes Historical Provider, MD    Family History Family History  Problem Relation Age of Onset  . Lung disease Father     [name]- worked at a [employer] History  Substance Use Topics  . Smoking status: Former Smoker    Packs/day: 0.50    Years: 60.00    Types: Cigars    Quit date: 05/18/2013  . Smokeless tobacco: Current User    Types: Chew  . Alcohol use Yes     Comment: rarely      Allergies   Review of patient's allergies indicates no known allergies.   Review of Systems Review of Systems  Ten systems reviewed and are negative for acute change, except as noted in the HPI.   Physical Exam Updated Vital Signs BP (!) 147/89 (BP Location: Right Arm)   Pulse 75   Temp 98.1 F (36.7 C) (Oral)   Resp 18   Ht 6' (1.829 m)  Wt 95.8 kg   SpO2 93%   BMI 28.66 kg/m   Physical Exam  Constitutional: He is oriented to person, place, and time.  Appears chronically ill, nondistressed.  HENT:  Head: Normocephalic and atraumatic.  Eyes: EOM are normal. Pupils are equal, round, and reactive to light.  Neck: Normal range of motion. No JVD present.  Cardiovascular: Normal rate and regular rhythm.   No murmur heard. No peripheral edema  Pulmonary/Chest: He has no wheezes.  Purse lipped breathing, speaking in full sentences. Increased breath sounds in the right lower lung field  Abdominal: He exhibits distension.  Diffuse telangiectasia across the abdomen and upper back. Large well-healed surgical scar in the right upper quadrant of the abdomen. No tenderness. Normal bowel sounds.  Musculoskeletal: Normal range of motion.  Neurological: He is alert and oriented to person, place, and time.  Skin: Skin is warm and dry.  Nursing note and vitals reviewed.    ED Treatments / Results  Labs (all labs ordered are listed, but only abnormal results are displayed) Labs  Reviewed  BASIC METABOLIC PANEL - Abnormal; Notable for the following:       Result Value   Glucose, Bld 105 (*)    GFR calc non Af Amer 56 (*)    All other components within normal limits  CBC WITH DIFFERENTIAL/PLATELET - Abnormal; Notable for the following:    RBC 6.36 (*)    HCT 54.1 (*)    RDW 16.7 (*)    Platelets 125 (*)    All other components within normal limits  BASIC METABOLIC PANEL - Abnormal; Notable for the following:    Creatinine, Ser 1.28 (*)    GFR calc non Af Amer 52 (*)    All other components within normal limits  CBC - Abnormal; Notable for the following:    RBC 5.92 (*)    RDW 16.4 (*)    Platelets 141 (*)    All other components within normal limits  LIPID PANEL - Abnormal; Notable for the following:    HDL 31 (*)    All other components within normal limits  I-STAT ARTERIAL BLOOD GAS, ED - Abnormal; Notable for the following:    pO2, Arterial 77.0 (*)    Acid-base deficit 3.0 (*)    All other components within normal limits  I-STAT CHEM 8, ED - Abnormal; Notable for the following:    BUN 21 (*)    Glucose, Bld 102 (*)    Calcium, Ion 1.14 (*)    Hemoglobin 18.4 (*)    HCT 54.0 (*)    All other components within normal limits  MRSA PCR SCREENING  BRAIN NATRIURETIC PEPTIDE  TROPONIN I  TROPONIN I  TROPONIN I  I-STAT TROPOININ, ED    EKG  EKG Interpretation  Date/Time:  Tuesday August 30 2016 10:21:24 EDT Ventricular Rate:  72 PR Interval:    QRS Duration: 103 QT Interval:  376 QTC Calculation: 412 R Axis:   -72 Text Interpretation:  Sinus rhythm Left anterior fascicular block Probable anterolateral infarct, age indeterm Abnormal T, consider ischemia, lateral leads deep T waves lateral  Confirmed by Gerald Leitz ([id number]) on 08/30/2016 10:25:41 AM Also confirmed by Gerald Leitz ([id number]), editor Lorenda Cahill CT, Leda Gauze [phone])  on 08/30/2016 10:55:02 AM       Radiology Ct Angio Chest Pe W And/or Wo Contrast  Addendum Date: 08/30/2016    ADDENDUM REPORT: 08/30/2016 14:48 ADDENDUM: Addition to the impression: 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  This is unchanged from 02/21/2014 and 07/21/2015. Electronically Signed   By: Franchot Gallo M.D.   On: 08/30/2016 14:48   Result Date: 08/30/2016 CLINICAL DATA:  Short of breath. Hypoxemia. COPD. Abnormal chest x-ray. EXAM: CT ANGIOGRAPHY CHEST WITH CONTRAST TECHNIQUE: Multidetector CT imaging of the chest was performed using the standard protocol during bolus administration of intravenous contrast. Multiplanar CT image reconstructions and MIPs were obtained to evaluate the vascular anatomy. CONTRAST:  100 mL Isovue 370 IV COMPARISON:  CT 07/21/2015, 02/21/2014 FINDINGS: Cardiovascular: Intraluminal filling defect in the right main pulmonary artery inferiorly has progressed slightly since the prior studies. This measures approximately 6 x 2.5 cm. This is smoothly marginated and appears to be chronic thrombus. This has grown somewhat over time. No evidence of acute pulmonary embolism. Pulmonary arteries mildly enlarged bilaterally compatible with pulmonary artery hypertension and COPD. Limited opacification of the thoracic aorta which is atherosclerotic. No aneurysm. Extensive coronary calcification. Low-density thrombus in the left ventricular apex with associated left ventricular apex aneurysm. Numerous dilated venous collaterals in the right anterior chest suggesting central venous stenosis. Left arm injection for contrast. Left innominate vein and subclavian vein patent. Left-sided transvenous pacemaker noted. Mediastinum/Nodes: Shotty nodes in the mediastinum. No pathologic adenopathy. Lungs/Pleura: Spiculated mass left upper lobe measuring 4.2 x 4.2 cm compatible with carcinoma of the lung. This corresponds to the abnormal chest x-ray finding. Extensive pleural calcification in the right posterior lung base similar to prior studies. There is right lower lobe parenchymal  density unchanged extending to pleural surface. Probable rounded atelectasis. Possible chronic pulmonary infarction from pulmonary embolism in the past. COPD with apical emphysema. Pulmonary hyperinflation. No significant pleural effusion. Upper Abdomen: Negative Musculoskeletal: No acute skeletal abnormality. Review of the MIP images confirms the above findings. IMPRESSION: 4.2 cm spiculated mass left upper lobe compatible with carcinoma of the lung. No definite mediastinal or hilar adenopathy. Filling defect in the right pulmonary artery slightly larger compared with prior studies. This is most compatible with chronic thrombus. Intravascular neoplasm not considered likely given the smooth margins and rarity of this diagnosis. Pleural calcification right lung base with rounded atelectasis in the right lung base. Findings compatible with extensive scarring possibly due to prior right lower lobe pulmonary infarction for pulmonary embolism. Electronically Signed: By: Franchot Gallo M.D. On: 08/30/2016 14:04    Procedures Procedures (including critical care time)  Medications Ordered in ED Medications  furosemide (LASIX) tablet 40 mg (40 mg Oral Given 09/01/16 1038)  lisinopril (PRINIVIL,ZESTRIL) tablet 10 mg (10 mg Oral Given 09/01/16 1039)  pravastatin (PRAVACHOL) tablet 20 mg (20 mg Oral Given 08/31/16 1652)  metoprolol tartrate (LOPRESSOR) tablet 25 mg (25 mg Oral Given 09/01/16 1039)  famotidine (PEPCID) tablet 20 mg (20 mg Oral Given 09/01/16 1039)  arformoterol (BROVANA) nebulizer solution 15 mcg (15 mcg Nebulization Given 09/01/16 0856)  sodium chloride flush (NS) 0.9 % injection 3 mL (0 mLs Intravenous Duplicate 81/44/81 8563)  sodium chloride flush (NS) 0.9 % injection 3 mL (not administered)  0.9 %  sodium chloride infusion (not administered)  acetaminophen (TYLENOL) tablet 650 mg (not administered)    Or  acetaminophen (TYLENOL) suppository 650 mg (not administered)  polyethylene glycol  (MIRALAX / GLYCOLAX) packet 17 g (not administered)  ondansetron (ZOFRAN) tablet 4 mg (not administered)    Or  ondansetron (ZOFRAN) injection 4 mg (not administered)  aspirin EC tablet 81 mg (81 mg Oral Given 09/01/16 1038)  sodium chloride flush (NS) 0.9 % injection 3 mL (3 mLs Intravenous  Given 08/31/16 2129)  heparin injection 5,000 Units (5,000 Units Subcutaneous Given 09/01/16 0558)  ipratropium-albuterol (DUONEB) 0.5-2.5 (3) MG/3ML nebulizer solution 3 mL (not administered)  budesonide (PULMICORT) nebulizer solution 0.5 mg (0.5 mg Nebulization Given 09/01/16 0856)  perflutren lipid microspheres (DEFINITY) IV suspension (3 mLs Intravenous Given 08/31/16 1035)  iopamidol (ISOVUE-370) 76 % injection (100 mLs Intravenous Contrast Given 08/30/16 1323)  Influenza vac split quadrivalent PF (FLUARIX) injection 0.5 mL (0.5 mLs Intramuscular Given 09/01/16 1041)     Initial Impression / Assessment and Plan / ED Course  I have reviewed the triage vital signs and the nursing notes.  Pertinent labs & imaging results that were available during my care of the patient were reviewed by me and considered in my medical decision making (see chart for details).  Clinical Course  Value Comment By Time   Patient here with shortness of breath, found to be hypoxic today, but has a history of chronic hypoxic respiratory failure. The patient is noted through review of the EMR to have a history of AAA, along with a chronic pulmonary embolism. He is not on any anticoagulation at this time. His history also lists a history of polycythemia vera and patient does not appear to be on hydroxyurea. Therefore, has a higher risk for potential pulmonary embolus. I've also auscultated. Increased breath sounds in the right lower lung field, although he has decreased breath sounds throughout the lung fields, likely secondary to his COPD or some asthma at this time. Question potential pneumonia, which could've also started his  SVT this morning. The patient's AICD did not fire. It will be interrogated today given the fact that his heart rate was in the 160s. We will evaluate the patient for potential pneumonia on x-ray. If it is a clear positive. The patient will be treated. Otherwise, progress to CT NGO. Also of concern for potential ACS given his history of MIs and worsening inversion of the T waves in the lateral leads on his EKG. Margarita Mail, PA-C 10/24 1049  DG Chest Port 1 View Patient with  mass on xray. Will obtain CT/PE.  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 eliquis d/c last year for popliteal artery anuerysm repair and does not appear to have been restarted. The patient is hypercoagulable givne his suspected lung carcinoma and known polycythemia vera. Patient will be admitted today for coagulation management as well as worsening dyspnea which may be multifactorial. I have discussed the findings with the patient who agrees with POC. Patient seen in shared visit with attending physician.   New Prescriptions Current Discharge Medication List       Margarita Mail, PA-C 09/01/16 [place], MD 09/06/16 1715

## 2016-08-31 ENCOUNTER — Inpatient Hospital Stay (HOSPITAL_COMMUNITY): Payer: Medicare Other

## 2016-08-31 DIAGNOSIS — I2699 Other pulmonary embolism without acute cor pulmonale: Secondary | ICD-10-CM

## 2016-08-31 DIAGNOSIS — J9621 Acute and chronic respiratory failure with hypoxia: Secondary | ICD-10-CM

## 2016-08-31 DIAGNOSIS — I499 Cardiac arrhythmia, unspecified: Secondary | ICD-10-CM

## 2016-08-31 DIAGNOSIS — R0600 Dyspnea, unspecified: Secondary | ICD-10-CM

## 2016-08-31 LAB — BASIC METABOLIC PANEL
Anion gap: 7 (ref 5–15)
BUN: 17 mg/dL (ref 6–20)
CHLORIDE: 106 mmol/L (ref 101–111)
CO2: 23 mmol/L (ref 22–32)
CREATININE: 1.28 mg/dL — AB (ref 0.61–1.24)
Calcium: 8.9 mg/dL (ref 8.9–10.3)
GFR calc Af Amer: >60 mL/min (ref >60)
GFR calc non Af Amer: 52 mL/min — ABNORMAL LOW (ref >60)
GLUCOSE: 78 mg/dL (ref 65–99)
POTASSIUM: 4.7 mmol/L (ref 3.5–5.1)
Sodium: 136 mmol/L (ref 135–145)

## 2016-08-31 LAB — CBC
HEMATOCRIT: 49.3 % (ref 39.0–52.0)
Hemoglobin: 15.4 g/dL (ref 13.0–17.0)
MCH: 26 pg (ref 26.0–34.0)
MCHC: 31.2 g/dL (ref 30.0–36.0)
MCV: 83.3 fL (ref 78.0–100.0)
Platelets: 141 10*3/uL — ABNORMAL LOW (ref 150–400)
RBC: 5.92 MIL/uL — ABNORMAL HIGH (ref 4.22–5.81)
RDW: 16.4 % — AB (ref 11.5–15.5)
WBC: 6.9 10*3/uL (ref 4.0–10.5)

## 2016-08-31 LAB — TROPONIN I

## 2016-08-31 MED ORDER — INFLUENZA VAC SPLIT QUAD 0.5 ML IM SUSY
0.5000 mL | PREFILLED_SYRINGE | INTRAMUSCULAR | Status: AC
  Administered 2016-09-01: 0.5 mL via INTRAMUSCULAR
  Filled 2016-08-31: qty 0.5

## 2016-08-31 MED ORDER — PERFLUTREN LIPID MICROSPHERE
1.0000 mL | INTRAVENOUS | Status: AC | PRN
  Administered 2016-08-31: 3 mL via INTRAVENOUS
  Filled 2016-08-31: qty 10

## 2016-08-31 MED ORDER — BUDESONIDE 0.5 MG/2ML IN SUSP
0.5000 mg | Freq: Two times a day (BID) | RESPIRATORY_TRACT | Status: DC
  Administered 2016-08-31 – 2016-09-01 (×3): 0.5 mg via RESPIRATORY_TRACT
  Filled 2016-08-31 (×2): qty 2

## 2016-08-31 MED ORDER — PERFLUTREN LIPID MICROSPHERE
INTRAVENOUS | Status: AC
  Filled 2016-08-31: qty 10

## 2016-08-31 NOTE — Consult Note (Signed)
CARDIOLOGY CONSULT NOTE   Patient ID: Jesus Sanders MRN: [id number] DOB/AGE: [birth year] 80 y.o.  Admit date: 08/30/2016  Primary Physician   Leonard Downing, MD Primary Cardiologist   Dr. Martinique (last seen 2014) Reason for Consultation   Arrhtymias Requesting Physician  Dr. Wendy Poet  HPI: Jesus Sanders is a 80 y.o. male with a history of CAD (STEMI 2012 s/p BMS to LAD), ICM s/p Medtronic ICD 2013, COPD with chronic respiratory failure on home oxygen PRN (follwed by Dr. [name]), chronic PE (enlarging), DVT, HTN, polycythemia vera, GERD, SVT, former smoker and PVD/right popliteal artery aneurysm s/p sent who presented by EMS for evaluation of dyspnea.   Last seen by [facility] 07/2015 [place] for cardiac clearance of right popliteal artery aneurysm repair to determine timing to hold Eliquis which was stated by IM due to PE. Last device check December 2016.  Per ER note 01/20/16 presented by EMS for SVT s/p adenosine '6mg'$ , 12 mg and 12 mg was administered with conversion.  It is unsure that why is not taking his Eliquis (not listed in home medication). He lives by himself and daughter lives nearby. Patient manages his medication and has memory issue.  Yesterday morning patient had a sudden onset shortness of breath that lasted for a few hours and EMS was called. He was noted to have a heart rate of 160s. Given 6 mg adenosine and then 12 mg adenosine with no change.  Given 20 mg Cardizem with a responding HR of 70 and remained NSR. Strip does not available for review. His defibrillator did not fired. Admits to having orthopnea and intermittent cough. Denies lower extremity edema, palpitation, melena or blood in his stool or urine.  EKG shows sinus rhythm at rate of 72 bpm and T-wave inversion in lateral lead which appears similar to prior EKG. Troponin negative x 3. 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 This is unchanged from 02/21/2014 and 07/21/2015. The patient was seen by pulmonary and planning for likely biopsy. Pending echo reading.   Past Medical History:  Diagnosis Date  . AAA (abdominal aortic aneurysm) ([place])    a. CT 07/2015 - mild aneurysmal dilatation of the distal abdominal aorta measuring 3.2 x 3.4 cm.  Marland Kitchen CAD (coronary artery disease)    a. 07/2011 Anterior apical STEMI/Cath/PCI: LM nl, LAD 100p/m (2.5 x 33m Mini-Vision BMS), LCX 40p, RCA dominant, nl, EF 30%.  . Chronic bronchitis   . Chronic respiratory failure ([place]   . COPD (chronic obstructive pulmonary disease) ([place]   . Dil[place] of thoracic aorta ([place]    a. CT 07/2015 - aneurysmal dilatation of the descending thoracic aorta measuring 3.9 cm in greatest diameter.  . Duodenal perforation ([facility] June 2012  . GERD (gastroesophageal reflux disease)   . History of DVT (deep vein thrombosis)   . Hypertension   . Ischemic cardiomyopathy    a. 01/2012 S/P MDT Protecta single lead ICD, ser # [id number]  . Myocardial infarction   . Peritonitis ([facility] June 2012  . Pneumonia April 2012  . Polycythemia vera(238.4)    a. [facility]  will restart his phlebotomies from about Mar 15 2011  . Popliteal aneurysm ([place]   . Pulmonary embolism ([place]    a. Diagnosed 07/2015 - placed on Eliquis.  . Shortness of breath   . Stomach ulcer   .  Systolic CHF, chronic ([place])    a. 12/2011 Echo: EF 25-30%, mid-dist antsept/inf, apical AK, Gr 1 DD, Triv AI, Mild MR.  . Tobacco abuse    a. cigars     Past Surgical History:  Procedure Laterality Date  . CARDIAC CATHETERIZATION  Sept 2012   Normal left main, occluded LAD, 40% LCX and normal RCA. EF is 30%  . CARDIAC DEFIBRILLATOR PLACEMENT     single chamber  . CHOLECYSTECTOMY  03/2011   Dr. Marlou Starks  . COLON SURGERY    . IMPLANTABLE CARDIOVERTER DEFIBRILLATOR IMPLANT N/A 01/11/2012   Procedure: IMPLANTABLE  CARDIOVERTER DEFIBRILLATOR IMPLANT;  Surgeon: Deboraha Sprang, MD;  Location: [facility] CATH LAB;  Service: Cardiovascular;  Laterality: N/A; Medtronic  . PERIPHERAL VASCULAR CATHETERIZATION N/A 08/05/2015   Procedure: Lower Extremity Angiography;  Surgeon: Serafina Mitchell, MD;  Location: [place] CV LAB;  Service: Cardiovascular;  Laterality: N/A;  . SHOULDER ARTHROSCOPY     left, rotatotor cuff tendinopathy  . US ECHOCARDIOGRAPHY  Sept 2012   EF 25 to 30% with akinesis of the mid to distal anterior and apical myocardium, trivial AI and no apical thrombus    No Known Allergies  I have reviewed the patient's current medications . arformoterol  15 mcg Nebulization BID  . aspirin EC  81 mg Oral Daily  . budesonide (PULMICORT) nebulizer solution  0.5 mg Nebulization BID  . famotidine  20 mg Oral Daily  . furosemide  40 mg Oral Daily  . heparin  5,000 Units Subcutaneous Q8H  . [START ON 09/01/2016] Influenza vac split quadrivalent PF  0.5 mL Intramuscular Tomorrow-1000  . lisinopril  10 mg Oral Daily  . metoprolol  25 mg Oral BID  . pravastatin  20 mg Oral q1800  . sodium chloride flush  3 mL Intravenous Q12H  . sodium chloride flush  3 mL Intravenous Q12H     sodium chloride, acetaminophen **OR** acetaminophen, ipratropium-albuterol, ondansetron **OR** ondansetron (ZOFRAN) IV, perflutren lipid microspheres (DEFINITY) IV suspension, polyethylene glycol, sodium chloride flush  Prior to Admission medications   Medication Sig Start Date End Date Taking? Authorizing Provider  BROVANA 15 MCG/2ML NEBU USE ONE VIAL IN NEBULIZER TWICE DAILY 08/04/15  Yes Tanda Rockers, MD  budesonide (PULMICORT) 0.25 MG/2ML nebulizer solution USE ONE VIAL IN NEBULIZER TWICE DAILY 08/04/15  Yes Tanda Rockers, MD  furosemide (LASIX) 40 MG tablet Take 40 mg by mouth daily.   Yes Historical Provider, MD  lisinopril (PRINIVIL,ZESTRIL) 20 MG tablet Take 10 mg by mouth daily.   Yes Historical Provider, MD  lovastatin  (MEVACOR) 20 MG tablet Take 20 mg by mouth at bedtime.   Yes Historical Provider, MD  metoprolol (LOPRESSOR) 50 MG tablet Take 25 mg by mouth 2 (two) times daily.   Yes Historical Provider, MD  ranitidine (ZANTAC) 150 MG tablet Take 150 mg by mouth daily.   Yes Historical Provider, MD     Social History   Social History  . Marital status: Widowed    Spouse name: N/A  . Number of children: N/A  . Years of education: N/A   Occupational History  . Not on file.   Social History Main Topics  . Smoking status: Former Smoker    Packs/day: 0.50    Years: 60.00    Types: Cigars    Quit date: 05/18/2013  . Smokeless tobacco: Current User    Types: Chew  . Alcohol use Yes     Comment: rarely   . Drug  use: No  . Sexual activity: No   Other Topics Concern  . Not on file   Social History Narrative   Lives with his granddaughter at home.    Daughter lives next door and is his healthcare power of attorney   Experiences dyspnea with minimal activity - sedentary.  Wife died about 1999-02-10 and   Retired Airline pilot.    Family Status  Relation Status  . Mother Deceased at age 76   natural causes  . Father Deceased  . Brother Alive  . Brother Alive  . Brother Alive  . Maternal Grandmother Deceased  . Maternal Grandfather Deceased  . Paternal Grandmother Deceased  . Paternal Grandfather Deceased   Family History  Problem Relation Age of Onset  . Lung disease Father     [name]- worked at a [employer]      ROS:  Full 14 point review of systems complete and found to be negative unless listed above.  Physical Exam: Blood pressure 123/70, pulse 62, temperature 98 F (36.7 C), temperature source Oral, resp. rate 18, height 6' (1.829 m), weight 212 lb 6.4 oz (96.3 kg), SpO2 96 %.  General: Well developed, well nourished, male in no acute distress Head: Eyes PERRLA, No xanthomas. Normocephalic and atraumatic, oropharynx without edema or exudate.  Lungs: Resp regular and unlabored,  CTA. Heart: RRR no s3, s4, or murmurs. Neck: No carotid bruits. No lymphadenopathy.  No JVD. Abdomen: Bowel sounds present, abdomen soft and non-tender without masses or hernias noted. Msk:  No spine or cva tenderness. No weakness, no joint deformities or effusions. Extremities: No clubbing, cyanosis or edema. DP/PT/Radials 2+ and equal bilaterally. Neuro: Alert and oriented X 3. No focal deficits noted. Psych:  Good affect, responds appropriately Skin: No rashes or lesions noted.  Labs:   Lab Results  Component Value Date   WBC 6.9 08/31/2016   HGB 15.4 08/31/2016   HCT 49.3 08/31/2016   MCV 83.3 08/31/2016   PLT 141 (L) 08/31/2016   No results for input(s): INR in the last 72 hours.  Recent Labs Lab 08/31/16 0500  NA 136  K 4.7  CL 106  CO2 23  BUN 17  CREATININE 1.28*  CALCIUM 8.9  GLUCOSE 78   Magnesium  Date Value Ref Range Status  03/27/2012 2.4 1.5 - 2.5 mg/dL Final    Recent Labs  08/30/16 1654 08/30/16 2215 08/31/16 0500  TROPONINI <0.03 <0.03 <0.03    Recent Labs  08/30/16 1125  TROPIPOC 0.00   Pro B Natriuretic peptide (BNP)  Date/Time Value Ref Range Status  08/18/2014 10:11 PM 308.9 0 - 450 pg/mL Final  02/21/2014 10:20 AM 539.4 (H) 0 - 450 pg/mL Final   Lab Results  Component Value Date   CHOL 106 12/27/2011   HDL 35.30 (L) 12/27/2011   LDLCALC 54 12/27/2011   TRIG 85.0 12/27/2011   Lab Results  Component Value Date   DDIMER 3.39 (H) 02/21/2014   Lipase  Date/Time Value Ref Range Status  01/27/2016 05:42 PM 118 (H) 11 - 51 U/L Final   Amylase  Date/Time Value Ref Range Status  04/18/2011 10:09 AM 77 0 - 105 U/L Final   No results found for: TSH, T4TOTAL, T3FREE, THYROIDAB Ferritin  Date/Time Value Ref Range Status  04/06/2007 03:26 PM 8 (L) 22 - 322 ng/mL Final   Retic Ct Pct  Date/Time Value Ref Range Status  02/22/2014 04:28 AM 1.8 0.4 - 3.1 % Final    Echo: Pending  reading this admission  Last echo 12/2011 showed  EF of 25-30%  Radiology:  Ct Angio Chest Pe W And/or Wo Contrast  Addendum Date: 08/30/2016   ADDENDUM REPORT: 08/30/2016 14:48 ADDENDUM: Addition to the impression: 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 This is unchanged from 02/21/2014 and 07/21/2015. Electronically Signed   By: Franchot Gallo M.D.   On: 08/30/2016 14:48   Result Date: 08/30/2016 CLINICAL DATA:  Short of breath. Hypoxemia. COPD. Abnormal chest x-ray. EXAM: CT ANGIOGRAPHY CHEST WITH CONTRAST TECHNIQUE: Multidetector CT imaging of the chest was performed using the standard protocol during bolus administration of intravenous contrast. Multiplanar CT image reconstructions and MIPs were obtained to evaluate the vascular anatomy. CONTRAST:  100 mL Isovue 370 IV COMPARISON:  CT 07/21/2015, 02/21/2014 FINDINGS: Cardiovascular: Intraluminal filling defect in the right main pulmonary artery inferiorly has progressed slightly since the prior studies. This measures approximately 6 x 2.5 cm. This is smoothly marginated and appears to be chronic thrombus. This has grown somewhat over time. No evidence of acute pulmonary embolism. Pulmonary arteries mildly enlarged bilaterally compatible with pulmonary artery hypertension and COPD. Limited opacification of the thoracic aorta which is atherosclerotic. No aneurysm. Extensive coronary calcification. Low-density thrombus in the left ventricular apex with associated left ventricular apex aneurysm. Numerous dilated venous collaterals in the right anterior chest suggesting central venous stenosis. Left arm injection for contrast. Left innominate vein and subclavian vein patent. Left-sided transvenous pacemaker noted. Mediastinum/Nodes: Shotty nodes in the mediastinum. No pathologic adenopathy. Lungs/Pleura: Spiculated mass left upper lobe measuring 4.2 x 4.2 cm compatible with carcinoma of the lung. This corresponds to the abnormal chest x-ray finding. Extensive pleural calcification in the  right posterior lung base similar to prior studies. There is right lower lobe parenchymal density unchanged extending to pleural surface. Probable rounded atelectasis. Possible chronic pulmonary infarction from pulmonary embolism in the past. COPD with apical emphysema. Pulmonary hyperinflation. No significant pleural effusion. Upper Abdomen: Negative Musculoskeletal: No acute skeletal abnormality. Review of the MIP images confirms the above findings. IMPRESSION: 4.2 cm spiculated mass left upper lobe compatible with carcinoma of the lung. No definite mediastinal or hilar adenopathy. Filling defect in the right pulmonary artery slightly larger compared with prior studies. This is most compatible with chronic thrombus. Intravascular neoplasm not considered likely given the smooth margins and rarity of this diagnosis. Pleural calcification right lung base with rounded atelectasis in the right lung base. Findings compatible with extensive scarring possibly due to prior right lower lobe pulmonary infarction for pulmonary embolism. Electronically Signed: By: Franchot Gallo M.D. On: 08/30/2016 14:04   Dg Chest Port 1 View  Result Date: 08/30/2016 CLINICAL DATA:  Shortness of Breath.  COPD. EXAM: PORTABLE CHEST 1 VIEW COMPARISON:  01/27/2016 FINDINGS: There is hyperinflation of the lungs compatible with COPD. Left pacer remains in place, unchanged. Enlargement of the left hilum concerning for left hilar or perihilar mass. Small bilateral effusions. Right base atelectasis or infiltrate. Heart is normal size. IMPRESSION: COPD. Left hilar enlargement concerning for possible mass. Recommend further evaluation with chest CT with IV contrast. Small bilateral effusions.  Right base atelectasis or infiltrate. Electronically Signed   By: Rolm Baptise M.D.   On: 08/30/2016 10:50    ASSESSMENT AND PLAN:     1. Lung mass/ Chronic PE - Followed by pulmonary. He was on Eliquis for chronic PE. Questionable when it was  discontinued or patient's discontinued by himself. This is likely contributing to his dyspnea.  2. CAD  s/p BMS  to LAD 2012 - No angina. Continue ASA and statin.   3. ICM s/p ICD 2013 - Echo 12/2011 showed ef of 25-30%. Pending echo reading this admission. Seems euvolemic. Continue BB, ACE, statin and lasix.   4. 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 He will need long term anticoagulation.   5. SVT - Strip unavailable for review. Will interrogate device. Sinus rhythm on EKG and telemetry.  6. HLD - Continue statin. Will get lipid panel.   SignedLeanor Kail, [place] 08/31/2016, 12:23 PM Pager [phone]  Co-Sign MD  Patient seen and examined and history reviewed. Agree with above findings and plan. Patient well known to me but not seen by me in 4 years. Admitted with increased SOB and in SVT. Converted with adenosine. Has newly diagnosed lung mass. By CT has LV thrombus. This may be old layered thrombus but need to assess with Echo. Will also interrogate ICD to assess arrhythmia burden. Need to reassess LV function. Will need to determine if resuming anticoagulation is indicated. If LV thrombus is old then indication may center more on chronic PE. Patient is willing to do what needs to be done to assess lung mass. Will defer to pulmonary. Will follow up in am after ICD check and Echo done.  Kathrin Folden [place], [place] 08/31/2016 4:55 PM

## 2016-08-31 NOTE — Progress Notes (Signed)
Name: Jesus Sanders MRN: [id number] DOB: 10-Jun-1936    ADMISSION DATE:  08/30/2016 CONSULTATION DATE:  08/30/16  REFERRING MD :  McDiarmid  CHIEF COMPLAINT:  SOB   HISTORY OF PRESENT ILLNESS:  Jesus Sanders is a 80 y.o. male with a PMH as outlined below.  He presented to [facility] ED 10/24 with SOB.  He apparently woke up with SOB at [facility] and stayed in bed hoping it would resolve.  He later got up and tried ambulating in hopes that this would improve his symptoms; however, it did not.  He did not have any fevers/chills/sweats, headaches, chest pain, cough, N/V/D, abd pain, myalgias, weight gain, LE edema.  On EMS arrival he was found to be in SVT.  He was given '6mg'$  adenosine followed by '12mg'$  without resolution.  He then received '20mg'$  Cardizem which converted rhythm to NSR.  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  He was supposed to start eliquis but pt doesn't recall ever taking it.  He sees Dr. [name] as outpatient for COPD (PFT's from May 2015 with FEV1 0.90, 27% and ratio 38 pre/37 post).  He was admitted by FPTS for acute on chronic hypoxic respiratory failure felt to be due to arrhythmia vs AECOPD.  Symptoms felt less likely to be due to chronic right PA thrombus.  On admission, he had CTA which had incidental finding of LUL 4.2cm spiculated mass.  PCCM was therefore consulted for further recs.  Pt is a former long time smoker (30 pack / year hx).  He has not had any hemoptysis, no weight loss.  SUBJECTIVE:  Patient denies any chest pain, tightness, or pressure. Reports dyspnea improving. He denies any significant cough.  REVIEW OF SYSTEMS:   No headache or acute vision changes. No lymphadenopathy in his neck, groin, or axilla. No abdominal pain or nausea.  VITAL SIGNS: Temp:  [97.6 F (36.4 C)-98.5 F (36.9 C)] 98 F (36.7 C) (10/25 1202) Pulse Rate:  [62-71] 62 (10/25  1202) Resp:  [18-22] 18 (10/25 1202) BP: (99-127)/(61-79) 123/70 (10/25 1202) SpO2:  [92 %-96 %] 96 % (10/25 1202) Weight:  [210 lb 14.4 oz (95.7 kg)-212 lb 6.4 oz (96.3 kg)] 212 lb 6.4 oz (96.3 kg) (10/25 0432)  PHYSICAL EXAMINATION: General:  Awake. Alert. No acute distress. Girlfriend at bedside.  Integument:  Warm & dry. No rash on exposed skin. Bruising of various ages on exposed upper extremities. HEENT:  Moist mucus membranes. No oral ulcers. No scleral injection or icterus.  Cardiovascular:  Regular rate. No edema. Unable to appreciate JVD with body positioning. Pulmonary:  Symmetrically decreased breath sounds. Normal work of breathing on high flow nasal cannula oxygen. Speaking in complete sentences. Abdomen: Soft. Normal bowel sounds. Nondistended. Grossly nontender. Neurological: Alert and oriented 4. No meningismus. Grossly nonfocal.    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 This is unchanged from 02/21/2014 and 07/21/2015. Electronically Signed   By: Franchot Gallo M.D.   On: 08/30/2016 14:48   Result Date: 08/30/2016 CLINICAL DATA:  Short of breath. Hypoxemia. COPD. Abnormal chest x-ray. EXAM: CT ANGIOGRAPHY CHEST WITH CONTRAST TECHNIQUE: Multidetector CT imaging of the chest was performed using the standard protocol during bolus administration of intravenous contrast. Multiplanar CT image reconstructions and MIPs were obtained to evaluate the vascular anatomy. CONTRAST:   100 mL Isovue 370 IV COMPARISON:  CT 07/21/2015, 02/21/2014 FINDINGS: Cardiovascular: Intraluminal filling defect in the right main pulmonary artery inferiorly has progressed slightly since the prior studies. This measures approximately 6 x 2.5 cm. This is smoothly marginated and appears to be chronic thrombus. This has grown somewhat over time. No evidence of acute pulmonary embolism. Pulmonary arteries mildly enlarged bilaterally compatible with pulmonary artery hypertension and COPD. Limited opacification of the thoracic aorta which is atherosclerotic. No aneurysm. Extensive coronary calcification. Low-density thrombus in the left ventricular apex with associated left ventricular apex aneurysm. Numerous dilated venous collaterals in the right anterior chest suggesting central venous stenosis. Left arm injection for contrast. Left innominate vein and subclavian vein patent. Left-sided transvenous pacemaker noted. Mediastinum/Nodes: Shotty nodes in the mediastinum. No pathologic adenopathy. Lungs/Pleura: Spiculated mass left upper lobe measuring 4.2 x 4.2 cm compatible with carcinoma of the lung. This corresponds to the abnormal chest x-ray finding. Extensive pleural calcification in the right posterior lung base similar to prior studies. There is right lower lobe parenchymal density unchanged extending to pleural surface. Probable rounded atelectasis. Possible chronic pulmonary infarction from pulmonary embolism in the past. COPD with apical emphysema. Pulmonary hyperinflation. No significant pleural effusion. Upper Abdomen: Negative Musculoskeletal: No acute skeletal abnormality. Review of the MIP images confirms the above findings. IMPRESSION: 4.2 cm spiculated mass left upper lobe compatible with carcinoma of the lung. No definite mediastinal or hilar adenopathy. Filling defect in the right pulmonary artery slightly larger compared with prior studies. This is most compatible with chronic thrombus. Intravascular  neoplasm not considered likely given the smooth margins and rarity of this diagnosis. Pleural calcification right lung base with rounded atelectasis in the right lung base. Findings compatible with extensive scarring possibly due to prior right lower lobe pulmonary infarction for pulmonary embolism. Electronically Signed: By: Franchot Gallo M.D. On: 08/30/2016 14:04   Dg Chest Port 1 View  Result Date: 08/30/2016 CLINICAL DATA:  Shortness of Breath.  COPD. EXAM: PORTABLE CHEST 1 VIEW COMPARISON:  01/27/2016 FINDINGS: There is hyperinflation of the lungs compatible with COPD. Left pacer remains in place, unchanged. Enlargement of the left hilum concerning for left hilar or perihilar mass. Small bilateral effusions. Right base atelectasis or infiltrate. Heart is normal size. IMPRESSION: COPD. Left hilar enlargement concerning for possible mass. Recommend further evaluation with chest CT with IV contrast. Small bilateral effusions.  Right base atelectasis or infiltrate. Electronically Signed   By: Rolm Baptise M.D.   On: 08/30/2016 10:50    STUDIES:  CTA Chest 10/24: 4.2cm spiculated LUL mass c/w carcinoma of the lung.  Chronic right pulmonary artery thrombus.  Rounded atx right base. Borderline mediastinal adenopathy on my review of chest CT imaging.  SIGNIFICANT EVENTS  10/24 - admitted with acute on chronic hypoxic respiratory failure.  ASSESSMENT / PLAN:  80 y.o. male with left upper lobe lung mass concerning for lung malignancy as well as underlying COPD. patient seems to  be clinically improving with oral Lasix therapy. It's difficult to determine overall volume status with nursing assessment. COPD exacerbation seems to be very mild at this time and I do not feel steroid therapy is necessary. I had a lengthy discussion with the patient's daughter via phone as well as he and his significant other at bedside. Given the severity of his underlying lung disease as well as oxygen requirement I feel a  biopsy at this time would be very high risk for the patient. We are deferring biopsy pending results of PET/CT imaging which will hopefully show an alternative biopsy site better for staging and diagnosis. Patient does have borderline mediastinal lymph nodes which would be amenable to endobronchial ultrasound-guided fine-needle aspiration but without pathologic enlargement I'm hesitant to recommend the procedure.  1. Left upper lobe lung mass: Deferring biopsy for now. Plan for outpatient PET/CT imaging and then decide upon biopsy depending upon this result. 2. Acute on chronic hypoxic respiratory failure: Likely multifactorial from a combination of COPD and congestive heart failure. Recommend continuing diuresis with Lasix as well as renal function and blood pressure allow. 3. Acute COPD exacerbation: Appears mild. Agree with continuing Pulmicort and Brovana nebulized twice daily.  Remainder of care as per primary service.   Sonia Baller Ashok Cordia, M.D. [facility] Pulmonary & Critical Care Pager:  [phone] After 3pm or if no response, call [phone] 08/31/2016, 2:38 PM

## 2016-08-31 NOTE — Progress Notes (Signed)
  Echocardiogram 2D Echocardiogram has been performed with definity.  Aggie Cosier 08/31/2016, 10:36 AM

## 2016-08-31 NOTE — Progress Notes (Signed)
Family Medicine Teaching Service Daily Progress Note Intern Pager: [phone]  Patient name: Jesus Sanders Medical record number: [id number] Date of birth: July 08, 1936 Age: 80 y.o. Gender: male  Primary Care Provider: Leonard Downing, MD Consultants: PCCM, Cardiology Code Status: Full   Pt Overview and Major Events to Date:  10/24: CTA  Assessment and Plan:  Jesus Sanders is a 80 y.o. male with a past medical history significant for  severe COPD, CHF, AAA, CAD w/ stent in 2012 for STEMI, polycythemia vera, popliteal aneurysm, Chronic PE, DVT, CVA who presented with acute onset of shortness of breath and SVT s/p diltiazem conversion.  #Acute Dyspnea, stable and improving:  Patient with acute onset on dyspnea yesterday morning and also found to be in SVT s/p diltiazem conversion. Since admission, patient as maintained good oxygen saturation. Patient with a history of chronic PE in [place] untreated though pt supposed to be on eliquis. New finding LUL spiculated mass and and extensive lung parenchymal scarring with an extensive history of COPD (former smoker). Patient with ICD in place. Dyspnea most likely of cardiac etiology in the setting of SVT s/p conversion. Could not rule out pulmonary etiology with new mass, chronic PE and COPD. Patient was placed on [place] and maintained O2 in the mid 90's overnight and maintaining good sats. PCCM consulted appreciate input, will continue to follow for recs. -- Continuous cardiac monitoring -- F/u on Cardiology recs -- F/u on am EKG  -- F/u on Echocardiogram -- Continue Duonebs prn -- Monitor respiratory status, supplemental O2 for sat < 88% -- F/u on AM BMP and CBC -- F/u on PCCM consult, appreciate recs  #Suspicious lung mass, new finding (10/24) PCCM discussed with patient new finding and explain options to patient. Patient wold like to discuss new lung mass finding with family before making therapeutic decision. PCCM will be follow, with option of  CT guided biopsy by IR vs EBUS.Patient poor candidate for anesthesia with poor cardia.  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 Patient started on eliquis but patient unaware of taking medication.   -- Continue IV Heparin for now -- Will need long term anticoagulation prior to discharge  #Severe COPD, stable Patient currently on  Brovana and Pulmicort. Patient has no O2 requirement at home but does state he uses O2 PRN. Patient was started on [place] overnight and continue to maintain good oxygen saturation.  --Continue Budesonide and arformoterol --Continue duonebs prn  --Supplemental O2 if O2 sats below 88%  #HFrEF with AICD in place: Last echo( 12/2011) showed an EF of 25-30%, mid-dist antsept/inf, apical AK, Gr 1 DD, Triv AI, Mild MR. AICD placed in 2013.  --F/u on Cardiology recs  --Continue metoprolol tartrate 25 mg BID --Continue lisinopril 10 mg daily --Continue lasix 40 mg daily --Daily weights --I/Os --F/u on Echo today   Other cardiac problems: HTN, HLD, CAD w/ stent in 2012 for STEMI.  --Continue pravastatin 20 mg --Add ASA 81 mg daily  #GERD:  Will continue home regimen --Pepcid 20 mg daily  #Polycythemia Vera, chronic and stable Patient with long history of getting phlebotomy ( started in 2007) Hgb stable at 18.4, Hct 54.0 --Daily CBC  FEN/GI: SLIV, heart healthy diet Prophylaxis: Heparin  Disposition: Admit to telemetry, medical management of dyspnea and new LUL mas   Subjective:  No acute events overnight. Patient feeling better this morning. Shortness of breath  has resolved, he denies palpitations or chest pain. Patient planning to discuss with family the lung mass seen on imaging.  Objective: Temp:  [97.6 F (36.4 C)-98.4 F (36.9 C)] 97.7 F (36.5 C) (10/24 2356) Pulse Rate:  [65-71] 65 (10/24 2356) Resp:   [18-23] 18 (10/24 2356) BP: (114-133)/(62-79) 116/79 (10/24 2356) SpO2:  [89 %-95 %] 95 % (10/24 2356) Weight:  [210 lb 14.4 oz (95.7 kg)-228 lb (103.4 kg)] 210 lb 14.4 oz (95.7 kg) (10/24 1638)   Physical Exam: General: In NAD, elderly gentleman sitting up in bed, pleasant and conversive Eyes: PERRLA ENTM: non icteric sclera, no nasal discharge, moist mucous membranes Neck: supple, no JVD Cardiovascular: regular rate and rhythm, no murmurs appreciated, no edema, 1+ DP pulses bilaterally Respiratory: normal work of breathing, no wheezing, rhonchi or crackles appreciated Gastrointestinal: horseshoe shaped scar across entire abdomen, soft, non distended, non tender, normal bowel sounds MSK: normal range of motion of all extremities Derm: Telangiectasias across entire body most prominent on cheeks, chest, abdomen, and back Neuro: alert and oriented x3, 5/5 strength in bilateral upper and lower extremities, normal sensation throughout Psych: normal mood and affect     Recent Labs Lab 08/30/16 1055 08/30/16 1127  WBC 7.8  --   HGB 16.8 18.4*  HCT 54.1* 54.0*  PLT 125*  --     Recent Labs Lab 08/30/16 1055 08/30/16 1127  NA 141 141  K 5.1 5.1  CL 109 107  CO2 26  --   BUN 14 21*  CREATININE 1.20 1.20  CALCIUM 9.2  --   GLUCOSE 105* 102*   Trop: <0.03, <0.03  Imaging/Diagnostic Tests: Ct Angio Chest Pe W And/or Wo Contrast  Addendum Date: 08/30/2016   ADDENDUM REPORT: 08/30/2016 14:48 ADDENDUM: Addition to the impression: 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 This is unchanged from 02/21/2014 and 07/21/2015. Electronically Signed   By: Franchot Gallo M.D.   On: 08/30/2016 14:48   Result Date: 08/30/2016 CLINICAL DATA:  Short of breath. Hypoxemia. COPD. Abnormal chest x-ray. EXAM: CT ANGIOGRAPHY CHEST WITH CONTRAST TECHNIQUE: Multidetector CT imaging of the chest was performed using the standard protocol during bolus administration of intravenous  contrast. Multiplanar CT image reconstructions and MIPs were obtained to evaluate the vascular anatomy. CONTRAST:  100 mL Isovue 370 IV COMPARISON:  CT 07/21/2015, 02/21/2014 FINDINGS: Cardiovascular: Intraluminal filling defect in the right main pulmonary artery inferiorly has progressed slightly since the prior studies. This measures approximately 6 x 2.5 cm. This is smoothly marginated and appears to be chronic thrombus. This has grown somewhat over time. No evidence of acute pulmonary embolism. Pulmonary arteries mildly enlarged bilaterally compatible with pulmonary artery hypertension and COPD. Limited opacification of the thoracic aorta which is atherosclerotic. No aneurysm. Extensive coronary calcification. Low-density thrombus in the left ventricular apex with associated left ventricular apex aneurysm. Numerous dilated venous collaterals in the right anterior chest suggesting central venous stenosis. Left arm injection for contrast. Left innominate vein and subclavian vein patent. Left-sided transvenous pacemaker noted. Mediastinum/Nodes: Shotty nodes in the mediastinum. No pathologic adenopathy. Lungs/Pleura: Spiculated mass left upper lobe measuring 4.2 x 4.2 cm compatible with carcinoma of the lung. This corresponds to the abnormal chest x-ray finding. Extensive pleural calcification in the right posterior lung base similar to prior studies. There is right lower lobe parenchymal density unchanged extending to pleural surface. Probable rounded atelectasis. Possible chronic pulmonary infarction from pulmonary embolism in the past. COPD with apical emphysema. Pulmonary hyperinflation. No significant pleural effusion. Upper  Abdomen: Negative Musculoskeletal: No acute skeletal abnormality. Review of the MIP images confirms the above findings. IMPRESSION: 4.2 cm spiculated mass left upper lobe compatible with carcinoma of the lung. No definite mediastinal or hilar adenopathy. Filling defect in the right  pulmonary artery slightly larger compared with prior studies. This is most compatible with chronic thrombus. Intravascular neoplasm not considered likely given the smooth margins and rarity of this diagnosis. Pleural calcification right lung base with rounded atelectasis in the right lung base. Findings compatible with extensive scarring possibly due to prior right lower lobe pulmonary infarction for pulmonary embolism. Electronically Signed: By: Franchot Gallo M.D. On: 08/30/2016 14:04   Dg Chest Port 1 View  Result Date: 08/30/2016 CLINICAL DATA:  Shortness of Breath.  COPD. EXAM: PORTABLE CHEST 1 VIEW COMPARISON:  01/27/2016 FINDINGS: There is hyperinflation of the lungs compatible with COPD. Left pacer remains in place, unchanged. Enlargement of the left hilum concerning for left hilar or perihilar mass. Small bilateral effusions. Right base atelectasis or infiltrate. Heart is normal size. IMPRESSION: COPD. Left hilar enlargement concerning for possible mass. Recommend further evaluation with chest CT with IV contrast. Small bilateral effusions.  Right base atelectasis or infiltrate. Electronically Signed   By: Rolm Baptise M.D.   On: 08/30/2016 10:50    Marjie Skiff, MD 08/31/2016, 2:27 AM PGY-, [place] Intern pager: [phone], text pages welcome

## 2016-09-01 ENCOUNTER — Other Ambulatory Visit: Payer: Self-pay | Admitting: Family Medicine

## 2016-09-01 DIAGNOSIS — C799 Secondary malignant neoplasm of unspecified site: Secondary | ICD-10-CM

## 2016-09-01 DIAGNOSIS — C801 Malignant (primary) neoplasm, unspecified: Secondary | ICD-10-CM

## 2016-09-01 DIAGNOSIS — R229 Localized swelling, mass and lump, unspecified: Secondary | ICD-10-CM

## 2016-09-01 DIAGNOSIS — Z711 Person with feared health complaint in whom no diagnosis is made: Secondary | ICD-10-CM

## 2016-09-01 DIAGNOSIS — IMO0002 Reserved for concepts with insufficient information to code with codable children: Secondary | ICD-10-CM

## 2016-09-01 LAB — LIPID PANEL
CHOL/HDL RATIO: 4.4 ratio
CHOLESTEROL: 136 mg/dL (ref 0–200)
HDL: 31 mg/dL — ABNORMAL LOW (ref >40)
LDL CALC: 78 mg/dL (ref 0–99)
Triglycerides: 134 mg/dL (ref <150)
VLDL: 27 mg/dL (ref 0–40)

## 2016-09-01 LAB — ECHOCARDIOGRAM COMPLETE
Height: 72 in
WEIGHTICAEL: 3398.4 [oz_av]

## 2016-09-01 MED ORDER — APIXABAN 5 MG PO TABS: 10.0000 mg | ORAL_TABLET | Freq: Two times a day (BID) | ORAL | 0 refills | Status: DC

## 2016-09-01 MED ORDER — ASPIRIN 81 MG PO TBEC: 81.0000 mg | DELAYED_RELEASE_TABLET | Freq: Every day | ORAL | 0 refills | Status: AC

## 2016-09-01 MED ORDER — APIXABAN 5 MG PO TABS: 10.0000 mg | ORAL_TABLET | Freq: Two times a day (BID) | ORAL | Status: DC

## 2016-09-01 MED ORDER — APIXABAN 5 MG PO TABS: 5.0000 mg | ORAL_TABLET | Freq: Two times a day (BID) | ORAL | Status: DC

## 2016-09-01 MED ORDER — APIXABAN 5 MG PO TABS: 5.0000 mg | ORAL_TABLET | Freq: Two times a day (BID) | ORAL | 6 refills | Status: DC

## 2016-09-01 NOTE — Progress Notes (Signed)
Patient Name: Jesus Sanders Date of Encounter: 09/01/2016  Primary Cardiologist: Dr. [place]  Hospital Problem List     Active Problems:   Dyspnea   Lung mass   Left ventricular apical thrombus without myocardial infarction   Thrombus of pulmonary vein (HCC)   Supraventricular dysrhythmia   Acute on chronic respiratory failure with hypoxia (HCC)     Subjective   Feels well, adamant that he wants to go home. Denies SOB.   Inpatient Medications    Scheduled Meds: . arformoterol  15 mcg Nebulization BID  . aspirin EC  81 mg Oral Daily  . budesonide (PULMICORT) nebulizer solution  0.5 mg Nebulization BID  . famotidine  20 mg Oral Daily  . furosemide  40 mg Oral Daily  . heparin  5,000 Units Subcutaneous Q8H  . Influenza vac split quadrivalent PF  0.5 mL Intramuscular Tomorrow-1000  . lisinopril  10 mg Oral Daily  . metoprolol  25 mg Oral BID  . pravastatin  20 mg Oral q1800  . sodium chloride flush  3 mL Intravenous Q12H  . sodium chloride flush  3 mL Intravenous Q12H   Continuous Infusions:   PRN Meds: sodium chloride, acetaminophen **OR** acetaminophen, ipratropium-albuterol, ondansetron **OR** ondansetron (ZOFRAN) IV, polyethylene glycol, sodium chloride flush   Vital Signs    Vitals:   08/31/16 1949 08/31/16 2043 09/01/16 0458 09/01/16 0805  BP: 126/84  129/76 (!) 147/89  Pulse: 71  65 75  Resp: '18  20 18  '$ Temp: 97.9 F (36.6 C)  97.6 F (36.4 C) 98.1 F (36.7 C)  TempSrc: Oral  Oral Oral  SpO2: 95% 96% 95% 100%  Weight:   211 lb 4.8 oz (95.8 kg)   Height:        Intake/Output Summary (Last 24 hours) at 09/01/16 0810 Last data filed at 09/01/16 0603  Gross per 24 hour  Intake             1168 ml  Output             1575 ml  Net             -407 ml   Filed Weights   08/30/16 1638 08/31/16 0432 09/01/16 0458  Weight: 210 lb 14.4 oz (95.7 kg) 212 lb 6.4 oz (96.3 kg) 211 lb 4.8 oz (95.8 kg)    Physical Exam   GEN: Well nourished, well  developed, in no acute distress.  HEENT: Grossly normal.  Neck: Supple, no JVD, carotid bruits, or masses. Cardiac: RRR, no murmurs, rubs, or gallops. No clubbing, cyanosis, edema.  Radials/DP/PT 2+ and equal bilaterally.  Respiratory:  Respirations regular and unlabored, clear to auscultation bilaterally. GI: Soft, nontender, nondistended, BS + x 4. MS: no deformity or atrophy. Skin: warm and dry, no rash. Neuro:  Strength and sensation are intact. Psych: AAOx3.  Normal affect.  Labs    CBC  Recent Labs  08/30/16 1055 08/30/16 1127 08/31/16 0500  WBC 7.8  --  6.9  NEUTROABS 6.1  --   --   HGB 16.8 18.4* 15.4  HCT 54.1* 54.0* 49.3  MCV 85.1  --  83.3  PLT 125*  --  638*   Basic Metabolic Panel  Recent Labs  08/30/16 1055 08/30/16 1127 08/31/16 0500  NA 141 141 136  K 5.1 5.1 4.7  CL 109 107 106  CO2 26  --  23  GLUCOSE 105* 102* 78  BUN 14 21* 17  CREATININE 1.20 1.20 1.28*  CALCIUM 9.2  --  8.9   Cardiac Enzymes  Recent Labs  08/30/16 1654 08/30/16 2215 08/31/16 0500  TROPONINI <0.03 <0.03 <0.03   Fasting Lipid Panel  Recent Labs  09/01/16 0408  CHOL 136  HDL 31*  LDLCALC 78  TRIG 134  CHOLHDL 4.4    Telemetry    NSR- Personally Reviewed  ECG     NSR, T wave inversion in anterolateral leads consistent with prior EKG.  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 This is unchanged from 02/21/2014 and 07/21/2015. Electronically Signed   By: Franchot Gallo M.D.   On: 08/30/2016 14:48   Result Date: 08/30/2016 CLINICAL DATA:  Short of breath. Hypoxemia. COPD. Abnormal chest x-ray. EXAM: CT ANGIOGRAPHY CHEST WITH CONTRAST TECHNIQUE: Multidetector CT imaging of the chest was performed using the standard protocol during bolus administration of intravenous  contrast. Multiplanar CT image reconstructions and MIPs were obtained to evaluate the vascular anatomy. CONTRAST:  100 mL Isovue 370 IV COMPARISON:  CT 07/21/2015, 02/21/2014 FINDINGS: Cardiovascular: Intraluminal filling defect in the right main pulmonary artery inferiorly has progressed slightly since the prior studies. This measures approximately 6 x 2.5 cm. This is smoothly marginated and appears to be chronic thrombus. This has grown somewhat over time. No evidence of acute pulmonary embolism. Pulmonary arteries mildly enlarged bilaterally compatible with pulmonary artery hypertension and COPD. Limited opacification of the thoracic aorta which is atherosclerotic. No aneurysm. Extensive coronary calcification. Low-density thrombus in the left ventricular apex with associated left ventricular apex aneurysm. Numerous dilated venous collaterals in the right anterior chest suggesting central venous stenosis. Left arm injection for contrast. Left innominate vein and subclavian vein patent. Left-sided transvenous pacemaker noted. Mediastinum/Nodes: Shotty nodes in the mediastinum. No pathologic adenopathy. Lungs/Pleura: Spiculated mass left upper lobe measuring 4.2 x 4.2 cm compatible with carcinoma of the lung. This corresponds to the abnormal chest x-ray finding. Extensive pleural calcification in the right posterior lung base similar to prior studies. There is right lower lobe parenchymal density unchanged extending to pleural surface. Probable rounded atelectasis. Possible chronic pulmonary infarction from pulmonary embolism in the past. COPD with apical emphysema. Pulmonary hyperinflation. No significant pleural effusion. Upper Abdomen: Negative Musculoskeletal: No acute skeletal abnormality. Review of the MIP images confirms the above findings. IMPRESSION: 4.2 cm spiculated mass left upper lobe compatible with carcinoma of the lung. No definite mediastinal or hilar adenopathy. Filling defect in the right  pulmonary artery slightly larger compared with prior studies. This is most compatible with chronic thrombus. Intravascular neoplasm not considered likely given the smooth margins and rarity of this diagnosis. Pleural calcification right lung base with rounded atelectasis in the right lung base. Findings compatible with extensive scarring possibly due to prior right lower lobe pulmonary infarction for pulmonary embolism. Electronically Signed: By: Franchot Gallo M.D. On: 08/30/2016 14:04   Dg Chest Port 1 View  Result Date: 08/30/2016 CLINICAL DATA:  Shortness of Breath.  COPD. EXAM: PORTABLE CHEST 1 VIEW COMPARISON:  01/27/2016 FINDINGS: There is hyperinflation of the lungs compatible with COPD. Left pacer remains in place, unchanged. Enlargement of the left hilum concerning for left hilar or perihilar mass. Small bilateral effusions. Right base atelectasis or infiltrate. Heart is normal size. IMPRESSION: COPD. Left hilar enlargement concerning for possible mass. Recommend further evaluation with chest CT with IV contrast.  Small bilateral effusions.  Right base atelectasis or infiltrate. Electronically Signed   By: Rolm Baptise M.D.   On: 08/30/2016 10:50    Cardiac Studies   Echo pending  Patient Profile     Mr. Skipper is a 80 year old male with a past medical history CAD (STEMI 2012 s/p BMS to LAD), ICM s/p [employer] ICD 2013, COPD with chronic respiratory failure on home oxygen PRN (follwed by Dr. [name]), chronic PE (enlarging), DVT, HTN, polycythemia vera, GERD, SVT, former smoker and PVD/right popliteal artery aneurysm s/p sent who presented by EMS for evaluation of dyspnea.   Assessment & Plan  1. Chronic PE: Followed by Pulmonary, according to last office visit in 06/2016 he was to be taking Eliquis however the patient tells me that he is not taking any anti-coagulation bc he has polycythemia. Will await Echo results to see if resuming anti-coagulation is indicated.   2. CAD s/p BMS to LAD  2012: Stable, chest pain free. Continue ASA and statin therapy.   3. ICM s/p ICD in 2013:   4. LV thrombus: Need repeat Echo to assess.   5. Left upper lobe lung mass: Pulmonology saw yesterday and says that biopsy is too high risk given his high oxygen requirement and underlying lung disease. A good alternative would be PET/CT imaging to assess for staging and diagnosis.     Signed, Arbutus Leas, NP  09/01/2016, 8:10 AM  Patient seen and examined and history reviewed. Agree with above findings and plan. Patient is feeling well. No dyspnea or palpitations. Telemetry shows no SVT. ICD check showed no events. Echo report is pending. I reviewed personally. There is a chronic mural thrombus in the apex. This appears a little larger than in 2013 but does appear layered. By CT there has been no change 2015, 2016, and 2017. Given the chronicity of this finding I do not think anticoagulation is needed. He is on appropriate therapy from a CHF standpoint. He is stable for DC today from a heart standpoint.   Bindi Klomp [place], [place] 09/01/2016 10:38 AM

## 2016-09-01 NOTE — Progress Notes (Signed)
Family Medicine Teaching Service Daily Progress Note Intern Pager: [phone]  Patient name: Jesus Sanders Medical record number: [id number] Date of birth: [birth year] Age: 80 y.o. Gender: male  Primary Care Provider: Leonard Downing, MD Consultants: PCCM, Cardiology Code Status: Full   Pt Overview and Major Events to Date:  10/24: CTA  Assessment and Plan:  Jesus Sanders is a 80 y.o. male with a past medical history significant for  severe COPD, CHF, AAA, CAD w/ stent in 2012 for STEMI, polycythemia vera, popliteal aneurysm, Chronic PE, DVT, CVA who presented with acute onset of shortness of breath and SVT s/p diltiazem conversion.  #Acute Dyspnea, stable and improving:  Patient with a history of chronic PE in [place] untreated though pt supposed to be on eliquis. New finding LUL spiculated mass and and extensive lung parenchymal scarring with an extensive history of COPD (former smoker). Patient with ICD in place. Could not rule out pulmonary etiology with new mass, chronic PE and COPD.  CVTS and CCM both feel that patient is at high risk for biopsy and will hold off.  Pulm recommend obtaining PET/CT to investigate biopsy site.  -- Continuous cardiac monitoring -- F/u on Cardiology recs -- F/u on am EKG  -- F/u on Echocardiogram -- Continue Duonebs prn -- Monitor respiratory status, supplemental O2 for sat < 88% -- F/u on AM BMP and CBC -- F/u on PCCM consult, appreciate recs  #Suspicious lung mass, new finding (10/24) PCCM discussed with patient new finding and explain options to patient. Patient wold like to discuss new lung mass finding with family before making therapeutic decision. PCCM will be follow, with option of CT guided biopsy by IR vs EBUS.Patient poor candidate for anesthesia with poor cardia.  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 Patient started on eliquis but patient unaware of taking medication.   -- Continue IV Heparin for now -- Will need long term anticoagulation prior to discharge  #Severe COPD, stable Patient currently on  Brovana and Pulmicort. Patient has no O2 requirement at home but does state he uses O2 PRN. Patient was started on [place] overnight and continue to maintain good oxygen saturation.  --Continue Budesonide and arformoterol --Continue duonebs prn  --Supplemental O2 if O2 sats below 88%  #HFrEF with AICD in place: Last echo( 12/2011) showed an EF of 25-30%, mid-dist antsept/inf, apical AK, Gr 1 DD, Triv AI, Mild MR. AICD placed in 2013.  Will investigate ICD this morning.  --F/u on Cardiology recs  --Continue metoprolol tartrate 25 mg BID --Continue lisinopril 10 mg daily --Continue lasix 40 mg daily --Daily weights --I/Os --F/u on Echo   Other cardiac problems: HTN, HLD, CAD w/ stent in 2012 for STEMI.  --Continue pravastatin 20 mg --Add ASA 81 mg daily  #GERD:  Will continue home regimen --Pepcid 20 mg daily  #Polycythemia Vera, chronic and stable Patient with long history of getting phlebotomy ( started in 2007) Hgb stable at 18.4, Hct 54.0 --Daily CBC  FEN/GI: SLIV, heart healthy diet Prophylaxis: Heparin  Disposition: Admit to telemetry, medical management of dyspnea and new LUL mas   Subjective:  No acute events overnight. Patient feeling better this morning. Shortness of breath has resolved, he denies palpitations or chest pain. Patient planning to discuss with family the lung mass seen on imaging.  Objective: Temp:  [97.6 F (36.4 C)-98 F (36.7 C)] 97.6 F (  36.4 C) (10/26 0458) Pulse Rate:  [62-71] 65 (10/26 0458) Resp:  [18-20] 20 (10/26 0458) BP: (123-129)/(70-84) 129/76 (10/26 0458) SpO2:  [95 %-96 %] 95 % (10/26 0458) Weight:  [211 lb 4.8 oz (95.8 kg)] 211 lb 4.8 oz (95.8 kg) (10/26 0458)   Physical Exam: General: In NAD, elderly gentleman sitting  up in bed, pleasant and conversive Eyes: PERRLA ENTM: non icteric sclera, no nasal discharge, moist mucous membranes Neck: supple, no JVD Cardiovascular: regular rate and rhythm, no murmurs appreciated, no edema, 1+ DP pulses bilaterally Respiratory: normal work of breathing, no wheezing, rhonchi or crackles appreciated Gastrointestinal: horseshoe shaped scar across entire abdomen, soft, non distended, non tender, normal bowel sounds MSK: normal range of motion of all extremities Derm: Telangiectasias across entire body most prominent on cheeks, chest, abdomen, and back Neuro: alert and oriented x3, 5/5 strength in bilateral upper and lower extremities, normal sensation throughout Psych: normal mood and affect     Recent Labs Lab 08/30/16 1055 08/30/16 1127 08/31/16 0500  WBC 7.8  --  6.9  HGB 16.8 18.4* 15.4  HCT 54.1* 54.0* 49.3  PLT 125*  --  141*    Recent Labs Lab 08/30/16 1055 08/30/16 1127 08/31/16 0500  NA 141 141 136  K 5.1 5.1 4.7  CL 109 107 106  CO2 26  --  23  BUN 14 21* 17  CREATININE 1.20 1.20 1.28*  CALCIUM 9.2  --  8.9  GLUCOSE 105* 102* 78   Trop: <0.03, <0.03  Imaging/Diagnostic Tests: No results found.  Eloise Levels, MD 09/01/2016, 7:17 AM PGY-1, [place] Intern pager: [phone], text pages welcome

## 2016-09-01 NOTE — Progress Notes (Signed)
Patient adamant about being discharged and stated he would leave in the next 30 minutes if not discharged.  MD on call paged and received discharge orders and given to patient.  All questions answered.  Discharged via wheelchair with all belongings.

## 2016-09-01 NOTE — Progress Notes (Signed)
Eliquis coupon card given to patient and spouse as requested with instructions on activation prior to usage. Mindi Slicker [facility] [phone]

## 2016-09-01 NOTE — Discharge Instructions (Signed)
You were admitted for shortness of breath and were found to have a mass in your lung worrisome for cancer.  At the time of your discharge we were trying to schedule imaging studies for you, but you were eager to leave and we were unable to do this.  You need a full body PET scan and CT scans.    We also started you on eliquis '10mg'$  that you will take two times daily for 7 days.  After 7 days you can take '5mg'$  twice daily.   You have follow up appointments with pulmonology on 09/23/16 at 9:30AM and medical oncology on 09/08/16 at 2:00PM.  Please see details below.  Also, please schedule an appointment with your primary doctor.

## 2016-09-01 NOTE — Progress Notes (Signed)
Name: BAYAN KUSHNIR MRN: [id number] DOB: 06/05/1936    ADMISSION DATE:  08/30/2016 CONSULTATION DATE:  08/30/16  REFERRING MD :  McDiarmid  CHIEF COMPLAINT:  SOB   HISTORY OF PRESENT ILLNESS:  Jesus Sanders is a 80 y.o. male with a PMH as outlined below.  He presented to [facility] ED 10/24 with SOB.  He apparently woke up with SOB at [facility] and stayed in bed hoping it would resolve.  He later got up and tried ambulating in hopes that this would improve his symptoms; however, it did not.  He did not have any fevers/chills/sweats, headaches, chest pain, cough, N/V/D, abd pain, myalgias, weight gain, LE edema.  On EMS arrival he was found to be in SVT.  He was given '6mg'$  adenosine followed by '12mg'$  without resolution.  He then received '20mg'$  Cardizem which converted rhythm to NSR.  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  He was supposed to start eliquis but pt doesn't recall ever taking it.  He sees Dr. [name] as outpatient for COPD (PFT's from May 2015 with FEV1 0.90, 27% and ratio 38 pre/37 post).  He was admitted by [facility] for acute on chronic hypoxic respiratory failure felt to be due to arrhythmia vs AECOPD.  Symptoms felt less likely to be due to chronic right PA thrombus.  On admission, he had CTA which had incidental finding of LUL 4.2cm spiculated mass.  PCCM was therefore consulted for further recs.  Pt is a former long time smoker (30 pack / year hx).  He has not had any hemoptysis, no weight loss.  SUBJECTIVE:  Feeling better.  Now on RA.  Wanting to go home.    VITAL SIGNS: Temp:  [97.6 F (36.4 C)-98.1 F (36.7 C)] 98.1 F (36.7 C) (10/26 0805) Pulse Rate:  [62-75] 75 (10/26 0805) Resp:  [18-20] 18 (10/26 0805) BP: (123-147)/(70-89) 147/89 (10/26 0805) SpO2:  [93 %-100 %] 93 % (10/26 0858) Weight:  [95.8 kg (211 lb 4.8 oz)] 95.8 kg (211 lb 4.8 oz) (10/26  0458)  PHYSICAL EXAMINATION: General:  Awake. Alert. No acute distress.  Integument:  Warm & dry. No rash on exposed skin. HEENT:  Moist mucus membranes. No oral ulcers. No scleral injection or icterus.  Cardiovascular:  Regular rate. No edema. Unable to appreciate JVD with body positioning. Pulmonary:  resps even non labored on RA, diminished throughout, no audible wheeze, Speaking in complete sentences. Abdomen: Soft. Normal bowel sounds. Nondistended. Grossly nontender. Neurological: Alert and oriented 4. No meningismus. Grossly nonfocal.    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 This is unchanged from 02/21/2014 and 07/21/2015. Electronically Signed   By: Franchot Gallo M.D.   On: 08/30/2016 14:48   Result Date: 08/30/2016 CLINICAL DATA:  Short of  breath. Hypoxemia. COPD. Abnormal chest x-ray. EXAM: CT ANGIOGRAPHY CHEST WITH CONTRAST TECHNIQUE: Multidetector CT imaging of the chest was performed using the standard protocol during bolus administration of intravenous contrast. Multiplanar CT image reconstructions and MIPs were obtained to evaluate the vascular anatomy. CONTRAST:  100 mL Isovue 370 IV COMPARISON:  CT 07/21/2015, 02/21/2014 FINDINGS: Cardiovascular: Intraluminal filling defect in the right main pulmonary artery inferiorly has progressed slightly since the prior studies. This measures approximately 6 x 2.5 cm. This is smoothly marginated and appears to be chronic thrombus. This  has grown somewhat over time. No evidence of acute pulmonary embolism. Pulmonary arteries mildly enlarged bilaterally compatible with pulmonary artery hypertension and COPD. Limited opacification of the thoracic aorta which is atherosclerotic. No aneurysm. Extensive coronary calcification. Low-density thrombus in the left ventricular apex with associated left ventricular apex aneurysm. Numerous dilated venous collaterals in the right anterior chest suggesting central venous stenosis. Left arm injection for contrast. Left innominate vein and subclavian vein patent. Left-sided transvenous pacemaker noted. Mediastinum/Nodes: Shotty nodes in the mediastinum. No pathologic adenopathy. Lungs/Pleura: Spiculated mass left upper lobe measuring 4.2 x 4.2 cm compatible with carcinoma of the lung. This corresponds to the abnormal chest x-ray finding. Extensive pleural calcification in the right posterior lung base similar to prior studies. There is right lower lobe parenchymal density unchanged extending to pleural surface. Probable rounded atelectasis. Possible chronic pulmonary infarction from pulmonary embolism in the past. COPD with apical emphysema. Pulmonary hyperinflation. No significant pleural effusion. Upper Abdomen: Negative Musculoskeletal: No acute skeletal abnormality. Review of the MIP images confirms the above findings. IMPRESSION: 4.2 cm spiculated mass left upper lobe compatible with carcinoma of the lung. No definite mediastinal or hilar adenopathy. Filling defect in the right pulmonary artery slightly larger compared with prior studies. This is most compatible with chronic thrombus. Intravascular neoplasm not considered likely given the smooth margins and rarity of this diagnosis. Pleural calcification right lung base with rounded atelectasis in the right lung base. Findings compatible with extensive scarring possibly due to prior right lower lobe pulmonary infarction for pulmonary embolism. Electronically  Signed: By: Franchot Gallo M.D. On: 08/30/2016 14:04    STUDIES:  CTA Chest 10/24: 4.2cm spiculated LUL mass c/w carcinoma of the lung.  Chronic right pulmonary artery thrombus.  Rounded atx right base. Borderline mediastinal adenopathy on my review of chest CT imaging.  SIGNIFICANT EVENTS  10/24 - admitted with acute on chronic hypoxic respiratory failure.  ASSESSMENT / PLAN:  80 y.o. male with left upper lobe lung mass concerning for lung malignancy as well as underlying COPD.  Clinically improving with diuresis.  COPD exacerbation seems to be very mild at this time and I do not feel steroid therapy is necessary. Given the severity of his underlying lung disease as well as oxygen requirement, biopsy at this time would be very high risk for the patient. We are deferring biopsy pending results of PET/CT imaging which will hopefully show an alternative biopsy site better for staging and diagnosis. Patient does have borderline mediastinal lymph nodes which would be amenable to endobronchial ultrasound-guided fine-needle aspiration but without pathologic enlargement I'm hesitant to recommend the procedure.  1. Left upper lobe lung mass: Deferring biopsy for now. Plan for outpatient PET/CT imaging and then decide upon biopsy depending upon this result. 2. Acute on chronic hypoxic respiratory failure: Improving.  Likely multifactorial from a combination of COPD and congestive heart failure. Continuing diuresis as renal function and blood pressure allow. 3. Acute COPD exacerbation: Mild. Agree  with continuing Pulmicort and Brovana nebulized twice daily.  Will arrange outpt pulm f/u.  PCCM signing off please call back if needed.    Nickolas Madrid, NP 09/01/2016  10:50 AM Pager: [phone] or [phone]

## 2016-09-02 NOTE — Discharge Summary (Signed)
[facility] Discharge Summary  Patient name: Jesus Sanders Medical record number: [id number] Date of birth: 1935/11/25 Age: 80 y.o. Gender: male Date of Admission: 08/30/2016  Date of Discharge: 09/01/16 Admitting Physician: Blane Ohara McDiarmid, MD  Primary Care Provider: Leonard Downing, MD Consultants: Pulmonology, cardiology, CVTS  Indication for Hospitalization: Acute shortness of breath  Discharge Diagnoses/Problem List:  Patient Active Problem List   Diagnosis Date Noted  . Thrombus of pulmonary vein ([place])   . Supraventricular dysrhythmia   . Acute on chronic respiratory failure with hypoxia ([place])   . Lung mass 08/30/2016  . 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 COPD exacerbation ([place]) 07/19/2015  . Acute respiratory failure ([place]) 07/19/2015  . Dyspnea 02/17/2015  . Acute on chronic respiratory failure with hypoxemia ([place]) 02/17/2015  . COPD GOLD  IV 05/17/2014  . Acute on chronic systolic CHF (congestive heart failure) ([place]) 05/16/2013  . Environmental allergies 04/13/2012  . Automatic implantable cardioverter-defibrillator- medtronic 04/11/2012  . Hyperlipidemia 02/27/2012  . GERD (gastroesophageal reflux disease) 01/12/2012  . Ischemic cardiomyopathy 01/04/2012  . HTN (hypertension) 09/06/2011  . Ecchymosis 08/16/2011  . CAD (coronary artery disease) 08/02/2011  . Chronic systolic heart failure ([place]) 08/02/2011  . Acute stomach ulcer 07/07/2011  . Duodenal perforation ([place]) 06/23/2011  . Bruises easily 05/03/2011  . Polycythemia vera ([place]) 03/11/2011     Disposition: Discharge home  Discharge Condition: Stable, improved  Discharge Exam:  General: In NAD, elderly gentleman sitting up in bed, pleasant and conversive Eyes: PERRLA ENTM: non icteric sclera, no nasal discharge, moist mucous membranes Neck: supple, no JVD Cardiovascular: regular rate and rhythm, no murmurs appreciated, no edema, 1+ DP  pulses bilaterally Respiratory: normal work of breathing, no wheezing, rhonchi or crackles appreciated Gastrointestinal: horseshoe shaped scar across entire abdomen, soft, non distended, non tender, normal bowel sounds MSK: normal range of motion of all extremities Derm: Telangiectasias across entire body most prominent on cheeks, chest, abdomen, and back Neuro: alert and oriented x3, 5/5 strength in bilateral upper and lower extremities, normal sensation throughout Psych: normal mood and affect   Brief Hospital Course:  Patient was admitted for acute shortness of breath initially thought to be due to arrhythmia versus COPD exacerbation versus worsening chronic pulmonary thrombosis.  On exam he had no signs of fluid overload, and EKG was unremarkable.  Was thought to have a tachyarrhythmia per EMS, and was given adenosine 2 without converting, and then diltiazem.  Implantable cardiac defibrillator did not fire. Initial physical exam showed no increased work of breathing and lungs are clear to auscultation bilaterally with normal ABG and normal BNP. Patient is CTA that showed a 4.2 cm nuclear mass in the left upper lobe compatible with carcinoma of the lung without any definite mediastinal or hilar adenopathy and also showed a right pulmonary artery chronic thrombus. The patient has a history of heart failure with reduced ejection fraction and an AICD in place we consulted cardiology who recommended repeat echo, and ICD check. Echo showed a chronic mural thrombus in the apex, there appears a little larger than in 2013 but it appears layered per Dr. [place].  He did not feel that anticoagulation was needed.  CVTS was consulted about patient's new lung mass and felt that since patient has multiple comorbidities, that he was not suitable for surgery and recommended consulting pulmonology for biopsy.  Pulmonology also felt the patient was a high risk for biopsy, and recommended that he receive PET and CT  scans  outpatient and we'll then reevaluate getting biopsy.  Patient continued receiving diuresis with Lasix during admission. He has follow-up appointment scheduled with pulmonology and his family doctor.  At the time of discharge patient was satting well on room air, was without pain and tolerating food well.    Issues for Follow Up:  1. Left upper lobe lung mass: Patient has a 4.2 cm eroded mass in the left upper lobe. Plan is to have PET scan and CT scan to reevaluate for better option for biopsy. Patient has follow-up appointment scheduled with pulmonology on 09/23/2016 at 9:30 AM.  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 This is unchanged from 02/21/2014 and 07/21/2015. Electronically Signed   By: Franchot Gallo M.D.   On: 08/30/2016 14:48   Result Date: 08/30/2016 CLINICAL DATA:  Short of breath. Hypoxemia. COPD. Abnormal chest x-ray. EXAM: CT ANGIOGRAPHY CHEST WITH CONTRAST TECHNIQUE: Multidetector CT imaging of the chest was performed using the standard protocol during bolus administration of intravenous contrast. Multiplanar CT image reconstructions and MIPs were obtained to evaluate the vascular anatomy. CONTRAST:  100 mL Isovue 370 IV COMPARISON:  CT 07/21/2015, 02/21/2014 FINDINGS: Cardiovascular: Intraluminal filling defect in the right main pulmonary artery inferiorly has progressed slightly since the prior  studies. This measures approximately 6 x 2.5 cm. This is smoothly marginated and appears to be chronic thrombus. This has grown somewhat over time. No evidence of acute pulmonary embolism. Pulmonary arteries mildly enlarged bilaterally compatible with pulmonary artery hypertension and COPD. Limited opacification of the thoracic aorta which is atherosclerotic. No aneurysm. Extensive coronary calcification. Low-density thrombus in the left ventricular apex with associated left ventricular apex aneurysm. Numerous dilated venous collaterals in the right anterior chest suggesting central venous stenosis. Left arm injection for contrast. Left innominate vein and subclavian vein patent. Left-sided transvenous pacemaker noted. Mediastinum/Nodes: Shotty nodes in the mediastinum. No pathologic adenopathy. Lungs/Pleura: Spiculated mass left upper lobe measuring 4.2 x 4.2 cm compatible with carcinoma of the lung. This corresponds to the abnormal chest x-ray finding. Extensive pleural calcification in the right posterior lung base similar to prior studies. There is right lower lobe parenchymal density unchanged extending to pleural surface. Probable rounded atelectasis. Possible chronic pulmonary infarction from pulmonary embolism in the past. COPD with apical emphysema. Pulmonary hyperinflation. No significant pleural effusion. Upper Abdomen: Negative Musculoskeletal: No acute skeletal abnormality. Review of the MIP images confirms the above findings. IMPRESSION: 4.2 cm spiculated mass left upper lobe compatible with carcinoma of the lung. No definite mediastinal or hilar adenopathy. Filling defect in the right pulmonary artery slightly larger compared with prior studies. This is most compatible with chronic thrombus. Intravascular neoplasm not considered likely given the smooth margins and rarity of this diagnosis. Pleural calcification right lung base with rounded  atelectasis in the right lung base. Findings compatible with  extensive scarring possibly due to prior right lower lobe pulmonary infarction for pulmonary embolism. Electronically Signed: By: Franchot Gallo M.D. On: 08/30/2016 14:04   Dg Chest Port 1 View  Result Date: 08/30/2016 CLINICAL DATA:  Shortness of Breath.  COPD. EXAM: PORTABLE CHEST 1 VIEW COMPARISON:  01/27/2016 FINDINGS: There is hyperinflation of the lungs compatible with COPD. Left pacer remains in place, unchanged. Enlargement of the left hilum concerning for left hilar or perihilar mass. Small bilateral effusions. Right base atelectasis or infiltrate. Heart is normal size. IMPRESSION: COPD. Left hilar enlargement concerning for possible mass. Recommend further evaluation with chest CT with IV contrast. Small bilateral effusions.  Right base atelectasis or infiltrate. Electronically Signed   By: Rolm Baptise M.D.   On: 08/30/2016 10:50    Results/Tests Pending at Time of Discharge: None  Discharge Medications:    Medication List    TAKE these medications   apixaban 5 MG Tabs tablet Commonly known as:  ELIQUIS Take 2 tablets (10 mg total) by mouth 2 (two) times daily.   apixaban 5 MG Tabs tablet Commonly known as:  ELIQUIS Take 1 tablet (5 mg total) by mouth 2 (two) times daily. Start taking on:  09/08/2016   aspirin 81 MG EC tablet Take 1 tablet (81 mg total) by mouth daily.   BROVANA 15 MCG/2ML Nebu Generic drug:  arformoterol USE ONE VIAL IN NEBULIZER TWICE DAILY   budesonide 0.25 MG/2ML nebulizer solution Commonly known as:  PULMICORT USE ONE VIAL IN NEBULIZER TWICE DAILY   furosemide 40 MG tablet Commonly known as:  LASIX Take 40 mg by mouth daily.   lisinopril 20 MG tablet Commonly known as:  PRINIVIL,ZESTRIL Take 10 mg by mouth daily.   lovastatin 20 MG tablet Commonly known as:  MEVACOR Take 20 mg by mouth at bedtime.   metoprolol 50 MG tablet Commonly known as:  LOPRESSOR Take 25 mg by mouth 2 (two) times daily.   ranitidine 150 MG tablet Commonly known  as:  ZANTAC Take 150 mg by mouth daily.       Discharge Instructions: Please refer to Patient Instructions section of EMR for full details.  Patient was counseled important signs and symptoms that should prompt return to medical care, changes in medications, dietary instructions, activity restrictions, and follow up appointments.   Follow-Up Appointments: Follow-up Information    Christinia Gully, MD Follow up on 09/23/2016.   Specialty:  Pulmonary Disease Why:  9:30am  Contact information: [address] [address] [phone]        Leonard Downing, MD. Go on 09/07/2016.   Specialty:  Family Medicine Why:  '@10'$ :Max Sane information: [place] [address] [phone]           Eloise Levels, MD 09/02/2016, 5:09 PM PGY-1, [place]

## 2016-09-07 ENCOUNTER — Other Ambulatory Visit: Payer: Self-pay | Admitting: *Deleted

## 2016-09-07 DIAGNOSIS — D45 Polycythemia vera: Secondary | ICD-10-CM

## 2016-09-08 ENCOUNTER — Other Ambulatory Visit: Payer: Medicare Other

## 2016-09-08 ENCOUNTER — Ambulatory Visit: Payer: Medicare Other | Admitting: Oncology

## 2016-09-11 ENCOUNTER — Encounter: Payer: Self-pay | Admitting: Oncology

## 2016-09-12 ENCOUNTER — Other Ambulatory Visit (HOSPITAL_COMMUNITY): Payer: Self-pay | Admitting: Family Medicine

## 2016-09-12 DIAGNOSIS — C3412 Malignant neoplasm of upper lobe, left bronchus or lung: Secondary | ICD-10-CM

## 2016-09-19 ENCOUNTER — Other Ambulatory Visit: Payer: Self-pay | Admitting: Internal Medicine

## 2016-09-20 ENCOUNTER — Encounter (HOSPITAL_COMMUNITY)
Admission: RE | Admit: 2016-09-20 | Discharge: 2016-09-20 | Disposition: A | Discharge status: Home / Self Care | Payer: Medicare Other | Source: Ambulatory Visit | Attending: Family Medicine | Admitting: Family Medicine

## 2016-09-20 DIAGNOSIS — C3412 Malignant neoplasm of upper lobe, left bronchus or lung: Secondary | ICD-10-CM | POA: Diagnosis not present

## 2016-09-20 LAB — GLUCOSE, CAPILLARY: GLUCOSE-CAPILLARY: 119 mg/dL — AB (ref 65–99)

## 2016-09-20 MED ORDER — FLUDEOXYGLUCOSE F - 18 (FDG) INJECTION
10.5000 | Freq: Once | INTRAVENOUS | Status: AC | PRN
  Administered 2016-09-20: 10.5 via INTRAVENOUS

## 2016-09-20 NOTE — Telephone Encounter (Signed)
Jesus Sanders ([facility] [phone]) call requesting the refill (brovana)request to be sent today.Jesus Sanders

## 2016-09-23 ENCOUNTER — Encounter: Payer: Self-pay | Admitting: Internal Medicine

## 2016-09-23 ENCOUNTER — Ambulatory Visit (INDEPENDENT_AMBULATORY_CARE_PROVIDER_SITE_OTHER): Payer: Medicare Other | Admitting: Internal Medicine

## 2016-09-23 VITALS — BP 100/60 | HR 71 | Ht 72.0 in | Wt 211.0 lb

## 2016-09-23 DIAGNOSIS — R918 Other nonspecific abnormal finding of lung field: Secondary | ICD-10-CM | POA: Diagnosis not present

## 2016-09-23 DIAGNOSIS — J9621 Acute and chronic respiratory failure with hypoxia: Secondary | ICD-10-CM | POA: Diagnosis not present

## 2016-09-23 DIAGNOSIS — J449 Chronic obstructive pulmonary disease, unspecified: Secondary | ICD-10-CM

## 2016-09-23 DIAGNOSIS — I10 Essential (primary) hypertension: Secondary | ICD-10-CM

## 2016-09-23 MED ORDER — VALSARTAN 160 MG PO TABS: 160.0000 mg | ORAL_TABLET | Freq: Every day | ORAL | 11 refills | Status: DC

## 2016-09-23 NOTE — Progress Notes (Signed)
Subjective:    Patient ID: Jesus Sanders, male    DOB: February 04, 1936   MRN: [id number]    Brief patient profile:  80 yowm quit smoking 2014 admitted to [facility] with establish GOLD IV copd as of 04/03/14    Admit date: 02/21/2014  Discharge date: 02/23/2014     Discharge Diagnoses:  Acute-on-chronic respiratory failure   Polycythemia vera(238.4)  HTN (hypertension)  Ischemic cardiomyopathy  GERD (gastroesophageal reflux disease)  COPD exacerbation, likely         Filed Weights    02/21/14 1945  02/22/14 0502   Weight:  100.8 kg (222 lb 3.6 oz)  100.6 kg (221 lb 12.5 oz)   History of present illness:  80 y.o. male came to [facility] ed 02/21/2014 with shortness of breath. No history of polycythemia vera, CAD history 07/2011 anterior apical MI S./P. BMS to LAD resulting in ischemic cardiomyopathy with systolic CHF [systolic 40%] s/p defibrillator 01/2012, severe COPD FEV1/FVC 0.40, H/o DVt in 2001 and [place] to have AECOPD and admitted for the same  Hospital Course:  Acute-on-chronic respiratory failure-likely AECOPD-treat with inhaled nebulizers every 4 when necessary, add steroids to 60 mg prednisone . Patient educated regarding inhalers as he is only on albuterol which is a rescue inhaler but he thought that his nebulizer was to be used Q6 when necessary and inhaler was a controller med  RT to teach use of proper use of inhaler and when to use what  I will prescribe for him Advair forCOPD Gold stage IV at least . He will need a burst of steroids for about 5-7 days and anticipate he will do well.might benefit from Cardio-pulm rehab  Polycythemia vera(238.4) currently with anemia presently-currently on Hydrea and gets prophylactic phlebotomy. To keep his hematocrit less than 40 which is clearly not at that . hydroxyurea was restarted February 2015. He will followup with his outpatient oncologist when needed  HTN (hypertension)-continue the medications as below. Blood pressure only moderately  controlled currently. Increase Lisinopril or add Amlodpine if still elevated in am  Ischemic cardiomyopathy, CAD history , placement of defibrillator 2012-keep on telemetry. Currently doing well -may benefit from alternative agent other than Coreg 25 mg as has COPD . Continue ACE inhibitor lisinopril 10 daily , continue aspirin 81 mg daily  GERD (gastroesophageal reflux disease) continue Protonix in place of Prilosec  History of DVT 2001-completed therapy with Coumadin  History CVA-stable currently  acute kidney injury BN/creatinine 25/1.24. Labs about the same monitor in the am. Lasix held initially but re-started. Will need close monitoring of bmet as OP  Hyperlipidemia-hold statin for now   05/16/2014 1st [facility] office visit/ [name] / [name] COPD  Chief Complaint  Patient presents with  . Pulmoanary Consult    Pt states that he was referred per Cardiac Rehab.  He c/o DOE for "I don't know, several years".  Occurs when "I do too much".     indolent onset doe x sev years to point where has trouble to mb and back flat has to stop half way back but not every day Also has it sitting still  Also has it also sometimes lying down Can do all but the most rigorous ex at rehab s 02  Better on dulera 100 2bid but not sure of what all meds he's using.   >>d/c ACE and rx Diovan    06/02/2014 Np note  Patient returns for a followup and medication review. Unfortunately, pt did not  bring any meds with him today.  He brought his pill box , his grand daughter does his meds.  He does not know any of his meds.  He does not have any prescription coverage.  Discussed with grandaughter on phone , can not afford inhalers.  Goes to cardiopulmonary  rehab. Uses Oxygen with exercise.  Not taking Dulera since last ov.   Reports breathing is somewhat worse since last ov with SOB, wheezing, thorat congestion. Last visit. Patient was changed off of his ACE inhibitor and started on Diovan. Gets short of  breath with minimal activity .  O2 sats 94% at rest  Walking O2 sats 87%.  Patient denies any hemoptysis, chest pain, orthopnea, abdominal pain, nausea, vomiting, or leg swelling.  >>started on O2. With walking. rx brovana/pulmicort neb    06/23/2014   NP note Last ov was started on pulmicort and brovana (he does not have rx coverage). And started on O2 with walking for desats.  He appears to be taking meds correctly. Says he breathing is stable with no flare in cough or wheezing.  rec Follow med calendar closely and bring to each visit.  Continue on current regimen .  Change to Mucinex DM Twice daily  As needed  Cough/congestion  May use Albuterol Neb every 4hrs as needed for wheezing/shortness of breath -this is your rescue medicine.  Continue with Oxygen with walking.    02/17/2015 f/u ov/Wert re: GOLD IV copd/ 02 dep/ no med cal/ no 02/no grand daughter, thoroughly confused with details of care  Chief Complaint  Patient presents with  . Follow-up    Pt states breathing is progressively worse since his last visit here. He was SOB walking from lobby to exam room today. He states SOB sometimes at rest. He also c/o clearing his throat often.    rec Be sure to take your furosemide when your legs/feet are swollen  Please remember to go to the lab and x-ray department downstairs for your tests - we will call you with the results when they are available. Try to wear your 02 24/7 at 2lpm but especially at bedtime and with any activity more than room to room walking   See Tammy NP w/in 2 weeks> did not do    Admit Date: 07/19/2015 Discharge date: 07/21/2015  Recommendations for Outpatient Follow-up:  1. Please ensure follow up with [facility]-Dr Brabham-for popliteal aneurysm 2. Please repeat CBC/BMET at next visit 3. Note-changed from Coreg to Metoprolol 4. Note:Chronic but enlarging PE-started on Eliquis 5. Will need monitoring of AAA and descending thoracic anuerysm  PRIMARY DISCHARGE  DIAGNOSIS: Principal Problem:  Acute respiratory failure Active Problems:  Polycythemia vera  CAD (coronary artery disease)  Chronic systolic heart failure  HTN (hypertension)  GERD (gastroesophageal reflux disease)  Hyperlipidemia  Automatic implantable cardioverter-defibrillator- medtronic  COPD GOLD IV  COPD exacerbation  Popliteal artery aneurysm    PAST MEDICAL HISTORY: Past Medical History  Diagnosis Date  . Polycythemia vera(238.4)     a. Used to receive chronic phlebotomies until 2007, at [facility]. will restart his phlebotomies from about Mar 15 2011  . Stomach ulcer   . Tobacco abuse     a. cigars  . Duodenal perforation June 2012  . Peritonitis June 2012  . Pneumonia April 2012  . Systolic CHF, chronic     a. 12/2011 Echo: EF 25-30%, mid-dist antsept/inf, apical AK, Gr 1 DD, Triv AI, Mild MR.  . Ischemic cardiomyopathy     a.  01/2012 S/P MDT Protecta single lead ICD, ser # [id number]  . History of DVT (deep vein thrombosis)   . Chronic bronchitis   . GERD (gastroesophageal reflux disease)   . Myocardial infarction   . Shortness of breath   . COPD (chronic obstructive pulmonary disease)   . Hypertension   . CAD (coronary artery disease)     a. 07/2011 Anterior apical STEMI/Cath/PCI: LM nl, LAD 100p/m (2.5 x 67m Mini-Vision BMS), LCX 40p, RCA dominant, nl, EF 30%.    DISCHARGE MEDICATIONS: Current Discharge Medication List    START taking these medications   Details  !! apixaban (ELIQUIS) 5 MG TABS tablet Take 2 tablets (10 mg total) by mouth 2 (two) times daily. 10 mg, Oral, 2 times daily, First dose on Tue 07/21/15 at 1515, For 14 doses Qty: 14 tablet, Refills: 0    !! apixaban (ELIQUIS) 5 MG TABS tablet Take 1 tablet (5 mg total) by mouth 2 (two) times daily. First dose on Tue 07/28/15 at 1000 Qty: 120 tablet, Refills: 0    levofloxacin (LEVAQUIN) 750  MG tablet Take 1 tablet (750 mg total) by mouth daily. Qty: 3 tablet, Refills: 0    metoprolol tartrate (LOPRESSOR) 25 MG tablet Take 1 tablet (25 mg total) by mouth 2 (two) times daily. Qty: 60 tablet, Refills: 0    predniSONE (DELTASONE) 10 MG tablet Take 4 tablets (40 mg) daily for 2 days, then, Take 3 tablets (30 mg) daily for 2 days, then, Take 2 tablets (20 mg) daily for 2 days, then, Take 1 tablets (10 mg) daily for 1 days, then stop Qty: 19 tablet, Refills: 0    !! - Potential duplicate medications found. Please discuss with provider.    CONTINUE these medications which have NOT CHANGED   Details  hydroxyurea (HYDREA) 500 MG capsule TAKE ONE CAPSULE BY MOUTH ONCE DAILY WITH FOOD TO MINIMIZE GI SIDE EFFECTS Qty: 30 capsule, Refills: 0    albuterol (PROVENTIL HFA;VENTOLIN HFA) 108 (90 BASE) MCG/ACT inhaler Inhale 2 puffs into the lungs every 6 (six) hours as needed for wheezing or shortness of breath. Qty: 18 g, Refills: 6    amLODipine (NORVASC) 10 MG tablet Take 1 tablet (10 mg total) by mouth daily. Qty: 30 tablet, Refills: 0    arformoterol (BROVANA) 15 MCG/2ML NEBU Take 2 mLs (15 mcg total) by nebulization 2 (two) times daily. Dx 491.9 Qty: 120 mL, Refills: 6   Associated Diagnoses: COPD mixed type    budesonide (PULMICORT) 0.25 MG/2ML nebulizer solution Take 2 mLs (0.25 mg total) by nebulization 2 (two) times daily. Dx 491.9 Qty: 120 mL, Refills: 6   Associated Diagnoses: COPD mixed type    dextromethorphan-guaiFENesin (MUCINEX DM) 30-600 MG per 12 hr tablet Take 1 tablet by mouth 2 (two) times daily as needed for cough.    furosemide (LASIX) 40 MG tablet Take 40 mg by mouth daily.     lovastatin (MEVACOR) 20 MG tablet Take 20 mg by mouth at bedtime.    omeprazole (PRILOSEC) 20 MG capsule Take 20 mg by mouth daily.    valsartan (DIOVAN) 80 MG tablet TAKE ONE TABLET BY MOUTH ONCE DAILY Qty: 30 tablet, Refills: 0        STOP taking these medications     aspirin EC 81 MG tablet      carvedilol (COREG) 25 MG tablet         ALLERGIES: No Known Allergies  BRIEF HPI: See H&P, Labs, Consult and Test reports  for all details in brief, patient was admitted for evaluation of worsening shortness of breath, felt to have COPD exacerbation and admitted for further evaluation and treatment.  CONSULTATIONS:  vascular surgery  PERTINENT RADIOLOGIC STUDIES:  Imaging Results    Ct Angio Ao+bifem W/cm &/or Wo/cm  07/21/2015 CLINICAL DATA: Shortness of breath. Lower extremity venous duplex ultrasound revealed evidence of a right popliteal artery aneurysm. EXAM: CT ANGIOGRAPHY OF ABDOMINAL AORTA WITH ILIOFEMORAL RUNOFF TECHNIQUE: Multidetector CT imaging of the abdomen, pelvis and lower extremities was performed using the standard protocol during bolus administration of intravenous contrast. Multiplanar CT image reconstructions and MIPs were obtained to evaluate the vascular anatomy. CONTRAST: 150m OMNIPAQUE IOHEXOL 350 MG/ML SOLN COMPARISON: CTA of the chest on 02/21/2014 FINDINGS: Aorta: The distal descending thoracic aorta shows aneurysmal dilatation measuring 3.9 cm in greatest diameter and containing a small amount of mural thrombus. At the level of the diaphragmatic hiatus, the proximal abdominal aorta measures 3.7 cm in greatest diameter. The aorta tapers to a normal caliber by the level of the celiac axis, measuring 3 cm. In the infrarenal segment, mild dilatation is present with the distal aorta measuring 3.2 x 3.4 cm. Mild plaque is present at the origins of the celiac axis and superior mesenteric artery without significant stenosis identified. Mild plaque at the origin of a single right renal artery does not cause significant stenosis. Two separate left renal arteries identified without significant stenosis. The inferior mesenteric artery is normally patent. Right Lower Extremity:  Diffuse calcified plaque is noted involving the common and internal iliac arteries without evidence of significant stenosis. The trunk of the right internal iliac artery demonstrates mild aneurysmal dilatation measuring 11 mm. The proximal external iliac artery shows mild plaque. The external iliac artery shows no significant stenosis. The common femoral artery shows mild plaque without significant stenosis. The femoral bifurcation is normally patent. The right superficial femoral artery demonstrates scattered plaque without significant stenosis. Maximal narrowing in the distal thigh approaches 40%. The profunda femoral artery is normally patent. Elongated and tortuous popliteal artery aneurysm present measuring 3.6 x 4.1 cm in greatest transverse diameter. The opacified lumen of the aneurysm is normal in caliber with the rest of the aneurysm demonstrating thrombosis. Aneurysmal dilatation ends just above the knee joint. The rest of the popliteal artery is normally patent below the knee. Below the knee, dominant runoff is present via the anterior tibial artery which is continuously patent into the foot. The posterior tibial artery is occluded in the proximal calf. The peroneal artery is diseased but is open into the foot. Left Lower Extremity: Left iliac arteries show calcified plaque without significant stenosis. Mild aneurysmal dilatation of the proximal left internal iliac artery measures 14 mm. The common femoral artery is normally patent. The femoral bifurcation shows normal patency. The profunda femoral artery is normally patent. The superficial femoral artery shows scattered plaque without significant stenosis. The popliteal artery demonstrates minimal focal dilatation just above the knee to 11 mm. No significant popliteal stenosis is identified. Below the knee, three-vessel patent runoff is present with fairly equal sized anterior and posterior tibial arteries. Laminated mural thrombus in the posterior  and inferior aspect of the right pulmonary artery extends just into the right lower lobe pulmonary artery. This thrombus measures just over 2 cm in maximal thickness and is more prominent compared to the prior CTA in April, 2015. This is still consistent with chronic thrombus but has clearly increased in prominence. Calcified plaque seen in the visualized coronary artery  tree. Calcified pleural plaque is again identified in the right hemithorax. Prominent adjacent parenchymal scarring and atelectasis near the level of pleural plaque appears similar compared to the prior CT. Multiple collateral veins are again visible in the lower right chest wall lateral right chest wall. The patient has an indwelling implanted defibrillator and collateral venous drainage may relate to chronic venous inflow stenosis in the chest related to the defibrillator. Review of the MIP images confirms the above findings. IMPRESSION: 1. Aneurysmal dilatation of the descending thoracic aorta measuring 3.9 cm in greatest diameter. 2. Mild aneurysmal dilatation of the distal abdominal aorta measuring 3.2 x 3.4 cm. 3. Mild aneurysmal disease of the proximal right internal iliac artery measuring 11 mm. 4. Prominent and elongated right popliteal artery aneurysm measuring 3.6 x 4.1 cm in greatest transverse diameter. This aneurysm is largely thrombosed. Patent flow is present through the aneurysmal segment. 5. Occlusion of the proximal right posterior tibial artery. Two-vessel runoff is present on the right below the knee. 6. Aneurysmal disease of the proximal left internal iliac artery measuring 14 mm. 7. Mild dilatation of the proximal left popliteal artery just above the knee measuring 11 mm. Three-vessel runoff is present below the knee on the left. 8. More prominent laminated mural thrombus in the posterior and inferior aspect of the right pulmonary artery just extending into the right lower lobe pulmonary artery. This does not have the  appearance of acute pulmonary embolism but clearly has increased in thickness and prominence compared to the prior CTA of the chest in April, 2015. Consider anticoagulation therapy for this finding. Electronically Signed By: Aletta Edouard M.D. On: 07/21/2015 13:38   Dg Chest Portable 1 View  07/19/2015 CLINICAL DATA: Respiratory distress EXAM: PORTABLE CHEST - 1 VIEW COMPARISON: 02/17/2015 FINDINGS: Lung bases are omitted from the field of view. Heart size at least moderately enlarged, with central vascular congestion. Left single lead pacer in place. Prominence of the left hilum compatible with prominent vascular pedicle reidentified. Patchy bibasilar airspace opacities are identified but likely not significantly changed since the prior exam, likely scarring. IMPRESSION: Suboptimal visualization with portable technique and partial obscuration of the lung bases. Emphysema with superimposed bibasilar scarring, although early pneumonia is not excluded. If symptoms persist, consider PA and lateral chest radiographs obtained at full inspiration when the patient is clinically able. Electronically Signed By: Conchita Paris M.D. On: 07/19/2015 12:23      PERTINENT LAB RESULTS: CBC:  Recent Labs (last 2 labs)      Recent Labs  07/19/15 1522 07/20/15 0612  WBC 17.5* 14.8*  HGB 15.4 15.5  HCT 50.1 49.9  PLT 138* 136*     CMET CMP  Labs (Brief)       Component Value Date/Time   NA 136 07/20/2015 0612   NA 144 01/14/2015 1027   K 4.6 07/20/2015 0612   K 4.2 01/14/2015 1027   CL 102 07/20/2015 0612   CO2 26 07/20/2015 0612   CO2 28 01/14/2015 1027   GLUCOSE 129* 07/20/2015 0612   GLUCOSE 131 01/14/2015 1027   BUN 28* 07/20/2015 0612   BUN 19.2 01/14/2015 1027   CREATININE 1.43* 07/20/2015 0612   CREATININE 1.3 01/14/2015 1027   CREATININE 1.57* 10/24/2012 1327   CALCIUM 9.2 07/20/2015  0612   CALCIUM 10.0 01/14/2015 1027   PROT 6.5 07/19/2015 1522   PROT 7.1 01/14/2015 1027   ALBUMIN 3.5 07/19/2015 1522   ALBUMIN 3.9 01/14/2015 1027   AST 19 07/19/2015 1522   AST  17 01/14/2015 1027   ALT 11* 07/19/2015 1522   ALT 12 01/14/2015 1027   ALKPHOS 78 07/19/2015 1522   ALKPHOS 96 01/14/2015 1027   BILITOT 0.6 07/19/2015 1522   BILITOT 0.97 01/14/2015 1027   GFRNONAA 46* 07/20/2015 0612   GFRNONAA 43* 10/24/2012 1327   GFRAA 53* 07/20/2015 0612   GFRAA 49* 10/24/2012 1327      GFR Estimated Creatinine Clearance: 53.4 mL/min (by C-G formula based on Cr of 1.43).  Recent Labs (last 2 labs)     No results for input(s): LIPASE, AMYLASE in the last 72 hours.    Recent Labs (last 2 labs)      Recent Labs  07/19/15 1905 07/20/15 0035 07/20/15 0612  TROPONINI <0.03 <0.03 <0.03      Recent Labs (last 2 labs)     Invalid input(s): POCBNP    Recent Labs (last 2 labs)     No results for input(s): DDIMER in the last 72 hours.    Recent Labs (last 2 labs)     No results for input(s): HGBA1C in the last 72 hours.    Recent Labs (last 2 labs)     No results for input(s): CHOL, HDL, LDLCALC, TRIG, CHOLHDL, LDLDIRECT in the last 72 hours.    Recent Labs (last 2 labs)     No results for input(s): TSH, T4TOTAL, T3FREE, THYROIDAB in the last 72 hours.  Invalid input(s): FREET3    Recent Labs (last 2 labs)     No results for input(s): VITAMINB12, FOLATE, FERRITIN, TIBC, IRON, RETICCTPCT in the last 72 hours.   Coags:  Recent Labs (last 2 labs)     No results for input(s): INR in the last 72 hours.  Invalid input(s): PT   Microbiology: Recent Results (from the past 240 hour(s))  Blood culture (routine x 2) Status: None (Preliminary result)   Collection Time: 07/19/15 2:02 PM  Result Value Ref Range Status   Specimen Description BLOOD LEFT ARM  Final   Special  Requests BOTTLES DRAWN AEROBIC AND ANAEROBIC 5CC  Final   Culture NO GROWTH 2 DAYS  Final   Report Status PENDING  Incomplete  Blood culture (routine x 2) Status: None (Preliminary result)   Collection Time: 07/19/15 2:07 PM  Result Value Ref Range Status   Specimen Description BLOOD RIGHT HAND  Final   Special Requests BOTTLES DRAWN AEROBIC ONLY 5CC  Final   Culture NO GROWTH 2 DAYS  Final   Report Status PENDING  Incomplete  MRSA PCR Screening Status: Abnormal   Collection Time: 07/19/15 10:33 PM  Result Value Ref Range Status   MRSA by PCR POSITIVE (A) NEGATIVE Final    Comment:   The GeneXpert MRSA Assay (FDA approved for NASAL specimens only), is one component of a comprehensive MRSA colonization surveillance program. It is not intended to diagnose MRSA infection nor to guide or monitor treatment for MRSA infections. RESULT CALLED TO, READ BACK BY AND VERIFIED WITH: IRBY,T RN [id number] 9/11/6 MITCHELL,L 07/19/15      BRIEF HOSPITAL COURSE:  Acute on chronic hypoxic respiratory failure: Secondary to COPD exacerbation, much improved. Required BiPAP on admission in the emergency room, now back on 3-4 L of oxygen via nasal cannula (home regimen)   COPD exacerbation: Significantly improved, no rhonchi-lungs are clear to auscultation. Patient feels he is back to his baseline. Will transition from Solu-Medrol to prednisone, patient asked to continue his regular inhaler/neb regimen and follow up with his PCP and Pulmonologist.  Popliteal aneurysm: Discovered incidentally on a lower extremity Doppler, seen by vascular surgery, plans up for a CT angiogram prior to discharge and outpatient follow-up with vascular surgery for definite workup/treatment.  Pulmonary Embolism:CT Angiogram done for above-demonstrated a chronic PE-this was reconfirmed with Dr Kathlene Cote radiologist, and then case was reviewed with PCCM-Dr  Nestor-who looked at the chart-recommendations are to start Anticoagulation. Case was discussed with patient-choices of anticoagulation was discussed-have started anticoagulation.   Polycythemia vera: Continue Hydrea-follow-up hemoglobin and hematocrit closely-if any significant worsening-will need therapeutic phlebotomy. Please monitor closely in the outpatient setting.  Chronic systolic heart failure: Suspect acute respiratory failure secondary to COPD rather than CHF. CHF remains compensated, continue oral Lasix, metoprolol and ARB. Weight remains stable at 223 pounds.   Descending Thoracic Aneurysm and WER:XVQM on CT Chest-stable for further monitoring to be done in the outpatient setting  Automatic implantable cardioverter-defibrillator- medtronic  GERD (gastroesophageal reflux disease): Continue PPI.   HTN (hypertension): BP controlled-continue with metoprolol, Lasix, ARB and amlodipine.   CAD (coronary artery disease): Continue aspirin, statin and beta blocker. Troponins negative.  Addendum: will stop ASA given patient will be on Eliquis  History of COPD GOLD IV         07/28/2015 extended post hosp f/u ov/Wert re: GOLD IV copd/ chronic resp failure Chief Complaint  Patient presents with  . HFU    Pt states that his breathing is doing well overall. He c/o frequent need to clear his throat.   Patient again is very confused with details of care. He did not take advantage of my offer to do medication reconciliation with my nurse practitioner and shows very little insight into his medications including how to use his nebulizer versus his other medicines in an appropriate way. He is now using oxygen 2 L 24 hours a day. However, he is extremely sedentary and cannot really answer the question ? what activity limited from doing because of his breathing? rec GERD diet  02 2lpm 24/7:  Brovana/budesonide automatically twice daily  Only use your albuterol as a rescue medication    Follow up here is as needed -  If you need to return for your breathing you will need to bring all your medications and inhalers and solutions with you to verify what you take is exactly what we have you listed as taking before we consider changing any of your meds      06/23/2016  f/u ov/Wert re: copd GOLD IV/brov/bud/02 2lpm  chronic resp failure/ non adherent    Chief Complaint  Patient presents with  . Follow-up    pt c/o stable sob with exertion.  no other complaints today.    doe = MMRC3 = can't walk 100 yards even at a slow pace at a flat grade s stopping due to sob  Even on 02 but says this is no change in baseline  rec Plan A = Automatic =  Brovana/budesonide twice daily  Plan B = Backup Only use your albuterol as a rescue medication    Date of Admission: 08/30/2016                    Date of Discharge: 09/01/16 Admitting Physician: Blane Ohara McDiarmid, MD  Indication for Hospitalization: Acute shortness of breath  Discharge Diagnoses/Problem List:      Patient Active Problem List   Diagnosis Date Noted  . Thrombus of pulmonary vein ([place])   . Supraventricular dysrhythmia   . Acute on chronic respiratory failure with  hypoxia ([place])   . Lung mass 08/30/2016  . 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 COPD exacerbation ([place]) 07/19/2015  . Acute respiratory failure ([place]) 07/19/2015  . Dyspnea 02/17/2015  . Acute on chronic respiratory failure with hypoxemia ([place]) 02/17/2015  . COPD GOLD  IV 05/17/2014  . Acute on chronic systolic CHF (congestive heart failure) ([place]) 05/16/2013  . Environmental allergies 04/13/2012  . Automatic implantable cardioverter-defibrillator- medtronic 04/11/2012  . Hyperlipidemia 02/27/2012  . GERD (gastroesophageal reflux disease) 01/12/2012  . Ischemic cardiomyopathy 01/04/2012  . HTN (hypertension) 09/06/2011  . Ecchymosis 08/16/2011  . CAD (coronary artery disease) 08/02/2011  . Chronic systolic heart failure  ([place]) 08/02/2011  . Acute stomach ulcer 07/07/2011  . Duodenal perforation ([place]) 06/23/2011  . Bruises easily 05/03/2011  . Polycythemia vera ([place]) 03/11/2011       09/23/2016  f/u ov/Wert re: GOLD IV / brov / bud / no 02 newly dx LUL lung mass  Chief Complaint  Patient presents with  . Hospitalization Follow-up    Breathing is not improving since hospital d/c. He is not using o2 at all. He has not been taking Eliquis b/c he can not afford it and did not think he was supposed to be taking this anyway.     still has throat congestion off brovana for a week and on ACEI   Doe = MMRC4  = sob if tries to leave home or while getting dressed  ? Better on 02 - doesn't wear it until he sits back down   No obvious day to day or daytime variabilty or assoc excess or purulent mucus  or cp or chest tightness, subjective wheeze overt sinus or hb symptoms. No unusual exp hx or h/o childhood pna/ asthma or knowledge of premature birth.  Sleeping ok without nocturnal  or early am exacerbation  of respiratory  c/o's or need for noct saba. Also denies any obvious fluctuation of symptoms with weather or environmental changes or other aggravating or alleviating factors except as outlined above   Current Medications, Allergies, Complete Past Medical History, Past Surgical History, Family History, and Social History were reviewed in [employer] record.  ROS  The following are not active complaints unless bolded sore throat, dysphagia, dental problems, itching, sneezing,  nasal congestion or excess/ purulent secretions, ear ache,   fever, chills, sweats, unintended wt loss, pleuritic or exertional cp, hemoptysis,  orthopnea pnd or leg swelling  , presyncope, palpitations, heartburn, abdominal pain, anorexia, nausea, vomiting, diarrhea  or change in bowel or urinary habits, change in stools or urine, dysuria,hematuria,  rash, arthralgias, visual complaints, headache, numbness weakness or  ataxia or problems with walking or coordination,  change in mood/affect or memory.               Objective:   Physical Exam   07/28/2015        226 >  06/23/2016   219 > 09/23/2016     02/17/15 234 lb 3.2 oz (106.232 kg)  01/22/15 240 lb 12.8 oz (109.226 kg)  01/14/15 229 lb 4.8 oz (104.01 kg)    Vital signs reviewed - Note on arrival 02 sats  89% on RA      amb hoarse very frail elderly wm nad   HEENT mild turbinate edema.  Oropharynx no thrush or excess pnd or cobblestoning.  No JVD or cervical adenopathy. Mild accessory muscle hypertrophy. Trachea midline, nl thryroid. Chest was hyperinflated by percussion with diminished breath sounds  and moderate increased exp time without wheeze. Regular rate and rhythm without murmur gallop or rub or increase P2  - trace  pitting edema both ankles .  Abd: no hsm, nl excursion. Ext warm without cyanosis or clubbing.        I personally reviewed images and agree with radiology impression as follows:  CT w/o contast Chest  09/20/16 1. Stable spiculated solid 5.2 cm posterior left upper lobe lung mass, which abuts and distorts the left major fissure, highly suspicious for a primary bronchogenic carcinoma. 2. No thoracic adenopathy or metastatic disease on this noncontrast CT. 3. Stable chronic plaque-like pleural thickening and calcification in the right posterior pleural space with associated rounded atelectasis and scarring in the right lower lobe.       Assessment & Plan:

## 2016-09-23 NOTE — Patient Instructions (Addendum)
Always Wear 02 when you walk outside your home and also certainly any other time you feel you need it   Stop the lisinopril and start diovan 160 mg one daily in its place   Return the first week in January 2018

## 2016-09-25 NOTE — Assessment & Plan Note (Signed)
Reasonable to do PET to see if any easily accessible mets but at this time he is not a candidate for fob and doubt CT guided bx worth the risk of ptx here  Discussed in detail all the  indications, usual  risks and alternatives  relative to the benefits with patient who agrees to proceed with conservative f/u as outlined

## 2016-09-25 NOTE — Assessment & Plan Note (Signed)
Try off acei 05/17/2014 due to ? Pseudoasthma > resolved - try off acei again 09/23/2016 same problem   In the best review of chronic cough to date ( NEJM 2016 375 [id number]) ,  ACEi are now felt to cause cough in up to  20% of pts which is a 4 fold increase from previous reports and does not include the variety of non-specific complaints we see in pulmonary clinic in pts on ACEi but previously attributed to another dx like  Copd/asthma and  include PNDS, throat and chest congestion, "bronchitis", unexplained dyspnea and noct "strangling" sensations, and hoarseness, but also  atypical /refractory GERD symptoms like dysphagia and "bad heartburn"   The only way I know  to prove this is not an "ACEi Case" is a trial off ACEi x a minimum of 6 weeks then regroup.

## 2016-09-25 NOTE — Assessment & Plan Note (Signed)
-  ambulatory desats 87% on RA >O2 w/ act 06/02/2014  -ONO 06/02/2014 >> never completed  - Sat on arrival RA 02/17/2015 = 88% - 02/17/2015  Walked 2lpm x 1 laps = 185 ft   stopped due to sob/ nl pace/ no desat    rec 02 2lpm 24/7 as of  09/23/2016 but not compliant so emphasized def need 02 hs and with activity

## 2016-09-25 NOTE — Assessment & Plan Note (Addendum)
PFT's 04/03/14 FEV1  0.90 (27%) ratio 38 and DLCO 19% corrects to 30   F/v physiologic - spirometry 05/17/14  FEV1  0.91 (26%) ratio 42 but f/v non physiologic -  Med calendar 06/23/2014 > not using as of 02/17/15   At baseline very near endstage dz with little more to offer and now has a LUL mass that will need to be addressed if we can keep him stable enough as outpt to address it - otherwise strongly consider a palliative/ hospice approach here.  Some of his symptoms at this point may be related to chf vs ACEi effects so rec first try off acei (see separate a/p) and regroup p Jan 1 to see what we can offer for the w/u for the lung mass  I had an extended discussion with the patient reviewing all relevant studies(including hosp records/ images)  completed to date and  lasting 25 minutes of a 40  minute transition of care  visit  re  non-specific but potentially very serious pulmonary symptoms of unknown etiology.(acei effects vs copd) and dealing with the seveity of his copd vis a vis w/u of his lung mass   Each maintenance medication was reviewed in detail including most importantly the difference between maintenance and prns and under what circumstances the prns are to be triggered using an action plan format that is not reflected in the computer generated alphabetically organized AVS.    Please see instructions for details which were reviewed in writing and the patient given a copy highlighting the part that I personally wrote and discussed at today's ov.

## 2016-11-10 ENCOUNTER — Ambulatory Visit: Payer: Medicare Other | Admitting: Internal Medicine

## 2016-12-12 ENCOUNTER — Emergency Department (HOSPITAL_COMMUNITY): Payer: Medicare Other

## 2016-12-12 ENCOUNTER — Emergency Department (HOSPITAL_COMMUNITY)
Admission: EM | Admit: 2016-12-12 | Discharge: 2016-12-12 | Disposition: A | Discharge status: Home / Self Care | Payer: Medicare Other | Attending: Emergency Medicine | Admitting: Emergency Medicine

## 2016-12-12 ENCOUNTER — Encounter (HOSPITAL_COMMUNITY): Payer: Self-pay | Admitting: Emergency Medicine

## 2016-12-12 ENCOUNTER — Telehealth: Payer: Self-pay | Admitting: *Deleted

## 2016-12-12 DIAGNOSIS — I5023 Acute on chronic systolic (congestive) heart failure: Secondary | ICD-10-CM | POA: Insufficient documentation

## 2016-12-12 DIAGNOSIS — I472 Ventricular tachycardia, unspecified: Secondary | ICD-10-CM

## 2016-12-12 DIAGNOSIS — I4892 Unspecified atrial flutter: Secondary | ICD-10-CM | POA: Diagnosis not present

## 2016-12-12 DIAGNOSIS — R0602 Shortness of breath: Secondary | ICD-10-CM | POA: Diagnosis present

## 2016-12-12 DIAGNOSIS — Z79899 Other long term (current) drug therapy: Secondary | ICD-10-CM | POA: Insufficient documentation

## 2016-12-12 DIAGNOSIS — J449 Chronic obstructive pulmonary disease, unspecified: Secondary | ICD-10-CM | POA: Insufficient documentation

## 2016-12-12 DIAGNOSIS — Z7982 Long term (current) use of aspirin: Secondary | ICD-10-CM | POA: Insufficient documentation

## 2016-12-12 DIAGNOSIS — I2510 Atherosclerotic heart disease of native coronary artery without angina pectoris: Secondary | ICD-10-CM | POA: Diagnosis not present

## 2016-12-12 DIAGNOSIS — Z87891 Personal history of nicotine dependence: Secondary | ICD-10-CM | POA: Diagnosis not present

## 2016-12-12 DIAGNOSIS — I252 Old myocardial infarction: Secondary | ICD-10-CM | POA: Diagnosis not present

## 2016-12-12 DIAGNOSIS — I110 Hypertensive heart disease with heart failure: Secondary | ICD-10-CM | POA: Diagnosis not present

## 2016-12-12 DIAGNOSIS — I471 Supraventricular tachycardia: Secondary | ICD-10-CM | POA: Diagnosis not present

## 2016-12-12 LAB — BASIC METABOLIC PANEL
Anion gap: 8 (ref 5–15)
BUN: 21 mg/dL — AB (ref 6–20)
CO2: 26 mmol/L (ref 22–32)
Calcium: 9 mg/dL (ref 8.9–10.3)
Chloride: 106 mmol/L (ref 101–111)
Creatinine, Ser: 1.48 mg/dL — ABNORMAL HIGH (ref 0.61–1.24)
GFR calc Af Amer: 50 mL/min — ABNORMAL LOW (ref >60)
GFR, EST NON AFRICAN AMERICAN: 44 mL/min — AB (ref >60)
GLUCOSE: 103 mg/dL — AB (ref 65–99)
Potassium: 4.2 mmol/L (ref 3.5–5.1)
Sodium: 140 mmol/L (ref 135–145)

## 2016-12-12 LAB — I-STAT TROPONIN, ED: Troponin i, poc: 0 ng/mL (ref 0.00–0.08)

## 2016-12-12 LAB — CBC WITH DIFFERENTIAL/PLATELET
Basophils Absolute: 0 10*3/uL (ref 0.0–0.1)
Basophils Relative: 0 %
EOS PCT: 2 %
Eosinophils Absolute: 0.1 10*3/uL (ref 0.0–0.7)
HCT: 51.9 % (ref 39.0–52.0)
Hemoglobin: 16.4 g/dL (ref 13.0–17.0)
LYMPHS ABS: 1.4 10*3/uL (ref 0.7–4.0)
LYMPHS PCT: 16 %
MCH: 27.3 pg (ref 26.0–34.0)
MCHC: 31.6 g/dL (ref 30.0–36.0)
MCV: 86.5 fL (ref 78.0–100.0)
MONO ABS: 0.6 10*3/uL (ref 0.1–1.0)
Monocytes Relative: 7 %
Neutro Abs: 6.7 10*3/uL (ref 1.7–7.7)
Neutrophils Relative %: 75 %
PLATELETS: 131 10*3/uL — AB (ref 150–400)
RBC: 6 MIL/uL — ABNORMAL HIGH (ref 4.22–5.81)
RDW: 16.1 % — AB (ref 11.5–15.5)
WBC: 8.8 10*3/uL (ref 4.0–10.5)

## 2016-12-12 LAB — TSH: TSH: 1.536 u[IU]/mL (ref 0.350–4.500)

## 2016-12-12 MED ORDER — METOPROLOL TARTRATE 5 MG/5ML IV SOLN
5.0000 mg | Freq: Once | INTRAVENOUS | Status: AC
  Administered 2016-12-12: 5 mg via INTRAVENOUS
  Filled 2016-12-12: qty 5

## 2016-12-12 MED ORDER — METOPROLOL SUCCINATE ER 100 MG PO TB24: 100.0000 mg | ORAL_TABLET | Freq: Two times a day (BID) | ORAL | 6 refills | Status: AC

## 2016-12-12 MED ORDER — METOPROLOL SUCCINATE ER 100 MG PO TB24: 100.0000 mg | ORAL_TABLET | Freq: Two times a day (BID) | ORAL | 6 refills | Status: DC

## 2016-12-12 MED ORDER — SODIUM CHLORIDE 0.9 % IV BOLUS (SEPSIS)
500.0000 mL | Freq: Once | INTRAVENOUS | Status: AC
  Administered 2016-12-12: 500 mL via INTRAVENOUS

## 2016-12-12 MED ORDER — METOPROLOL SUCCINATE ER 100 MG PO TB24: 100.0000 mg | ORAL_TABLET | Freq: Every day | ORAL | 0 refills | Status: DC

## 2016-12-12 NOTE — ED Notes (Signed)
Pt. Refusing to use urinal. Pt. Insisting he can walk to restroom. Pt. SOB upon return and SPO2 @ 88%. Oxygen increased to 4L at this time. Pt. Wishing to go home.

## 2016-12-12 NOTE — Telephone Encounter (Signed)
-----   Message from [facility], [place] sent at 12/12/2016  3:15 PM EST ----- Barbera Setters,  We saw this patient in the ER today, Dr. Curt Bears wanted him to go home with Toprol XL '100mg'$  BID, looks like they wrote for him only once daily.  Can you please call him and send in the correct Rx please?  I messaged Melissa to have follow up arranged with dr. Caryl Comes in the next couple weeks  Thanks Joseph Art

## 2016-12-12 NOTE — ED Triage Notes (Signed)
Patient in with [facility] after waking around 0530 this morning with shortness of breath.  On EMS arrival patient found to be in afib RVR with rate of 170.  Given 6 and then 12 of adenosine en route, patient converted to sinus tach.  Patient ST rate of 117 on arrival.  Patient on 2L O2 [place], wears 2L at home PRN.   Patient also has productive cough, patient denies taking any medications for his cold symptoms.  Patient received 2 nebulizer treatments PTA from EMS.  Patient alert and oriented at this time, states he still feels short of breath but not as severe as this morning.

## 2016-12-12 NOTE — ED Notes (Addendum)
Pacemaker rep at bedside.

## 2016-12-12 NOTE — Telephone Encounter (Signed)
Advised pt to take Toprol 100 mg BID, not once daily as d/c instructions advised. Updated rx sent to [facility] Patient verbalized understanding and agreeable to plan.

## 2016-12-12 NOTE — Consult Note (Signed)
ELECTROPHYSIOLOGY CONSULT NOTE    Patient ID: Jesus Sanders MRN: [id number], DOB/AGE: [id number] y.o.  Admit date: 12/12/2016 Date of Consult: 12/12/2016  Primary Physician: Leonard Downing, MD Primary Cardiologist: Dr. Martinique  Electrophysiologist: Dr. Caryl Comes (last saw March 2016)  Reason for Consultation: WCT  HPI: Jesus Sanders is a 81 y.o. male severe COPD with chronic respiratory failure on home O2, chronic appearing but enlarging PE in 07/2015(appears he was treated with Eliquis, no longer on), CAD (STEMI 2012 s/p BMS to LAD), ICM s/p Medtronic ICD 2013 (last echo in 2013 showed EF 25-30%), microcytic anemia, h/o DVT, GERD, HTN, prior h/o polycythema vera, PVD/right popliteal artery aneurysm s/p stent, and known LUL lung mass  He was last seen by cardiology service as an in-patient consult October 2017, noting sudden onset shortness of breath that lasted for a few hours and EMS was called. He was noted to have a heart rate of 160s. Given 6 mg adenosine and then 12 mg adenosine with no change. Given 20 mg Cardizem with a responding HR of 70 and remained NSR. Strips dwere not available for review.  In review of ER records today, MD reports that he was given 6 and '12mg'$  of adenosine, With 12 mg had a brief pause, followed by a few escape beats, then resumption of wide complex tachycardia.  In the ER he received IV lopressor with HR 110bpm.  The patient reports waking unusually SOB (noting he has baseline severe COPD and uses home O2 PRN.  He tried using his nebulizer without improvement and EMS was called.  Here now he feels "fine", just like normal.  He had no CP, did not perceive palpitations.  He has not been shocked by his device, no syncope.  EMS EKG's reviewed and is SVT with RBBB, this looks new since October   ICD was interrogated, battery and lead status stable, is a single lead device, episodes in VT zone this morning by  Morphology appears unchanged from his baseline.   There is one NSVT episode logged in December, this morphology appears different, though his current morphology/RBBB is new  LABS K+ 4.2 BUN/Creat 21/1.48 poc Trop 0.00 H/H16/51 WBC 8.8 plts 131 TSH 1.536   Past Medical History:  Diagnosis Date  . AAA (abdominal aortic aneurysm) ([place])    a. CT 07/2015 - mild aneurysmal dilatation of the distal abdominal aorta measuring 3.2 x 3.4 cm.  Marland Kitchen CAD (coronary artery disease)    a. 07/2011 Anterior apical STEMI/Cath/PCI: LM nl, LAD 100p/m (2.5 x 32m Mini-Vision BMS), LCX 40p, RCA dominant, nl, EF 30%.  . Chronic bronchitis   . Chronic respiratory failure ([place]   . COPD (chronic obstructive pulmonary disease) ([place]   . [place]    a. CT 07/2015 - aneurysmal dilatation of the descending thoracic aorta measuring 3.9 cm in greatest diameter.  . Duodenal perforation ([facility] June 2012  . GERD (gastroesophageal reflux disease)   . History of DVT (deep vein thrombosis)   . Hypertension   . Ischemic cardiomyopathy    a. 01/2012 S/P MDT Protecta single lead ICD, ser # [id number]  . Myocardial infarction   . [facility]iton[facility] ([facility] 2012  . Pneumonia April 2012  . Polycythemia vera(238.4)    a. Used to receive chronic phlebotomies until 2007, at [facility]  [place] Benassi restart his phlebotomies from about Mar 15 2011  . Popliteal aneurysm ([place]   . Pulmonary embolism ([place]    a. Diagnosed 07/2015 -  placed on Eliquis.  . Shortness of breath   . Stomach ulcer   . Systolic CHF, chronic ([place])    a. 12/2011 Echo: EF 25-30%, mid-dist antsept/inf, apical AK, Gr 1 DD, Triv AI, Mild MR.  . Tobacco abuse    a. cigars     Surgical History:  Past Surgical History:  Procedure Laterality Date  . CARDIAC CATHETERIZATION  Sept 2012   Normal left main, occluded LAD, 40% LCX and normal RCA. EF is 30%  . CARDIAC DEFIBRILLATOR PLACEMENT     single chamber  . CHOLECYSTECTOMY  03/2011   Dr. Marlou Starks  . COLON SURGERY    . IMPLANTABLE  CARDIOVERTER DEFIBRILLATOR IMPLANT N/A 01/11/2012   Procedure: IMPLANTABLE CARDIOVERTER DEFIBRILLATOR IMPLANT;  Surgeon: Deboraha Sprang, MD;  Location: [facility] CATH LAB;  Service: Cardiovascular;  Laterality: N/A; Medtronic  . PERIPHERAL VASCULAR CATHETERIZATION N/A 08/05/2015   Procedure: Lower Extremity Angiography;  Surgeon: Serafina Mitchell, MD;  Location: [place] CV LAB;  Service: Cardiovascular;  Laterality: N/A;  . SHOULDER ARTHROSCOPY     left, rotatotor cuff tendinopathy  . US ECHOCARDIOGRAPHY  Sept 2012   EF 25 to 30% with akinesis of the mid to distal anterior and apical myocardium, trivial AI and no apical thrombus      (Not in a hospital admission)  Inpatient Medications:   Allergies: No Known Allergies  Social History   Social History  . Marital status: Widowed    Spouse name: N/A  . Number of children: N/A  . Years of education: N/A   Occupational History  . Not on file.   Social History Main Topics  . Smoking status: Former Smoker    Packs/day: 0.50    Years: 60.00    Types: Cigars    Quit date: 05/18/2013  . Smokeless tobacco: Current User    Types: Chew  . Alcohol use Yes     Comment: rarely   . Drug use: No  . Sexual activity: No   Other Topics Concern  . Not on file   Social History Narrative   Lives with his granddaughter at home.    Daughter lives next door and is his healthcare power of attorney   Experiences dyspnea with minimal activity - sedentary.  Wife died about 02-03-99 and   Retired Airline pilot.     Family History  Problem Relation Age of Onset  . Lung disease Father     [name]- worked at a [employer]: All other systems reviewed and are otherwise negative except as noted above.  Physical Exam: Vitals:   12/12/16 1030 12/12/16 1100 12/12/16 1130 12/12/16 1200  BP: 100/65 111/67 111/70 103/71  Pulse: 73 72 73 74  Resp: (!) 30 26 (!) 30 25  Temp:      TempSrc:      SpO2: (!) 89% 92% 95% 91%    GEN-  The patient is in NAD, alert and oriented x 3 today.   HEENT: normocephalic, atraumatic; sclera clear, conjunctiva pink; hearing intact; oropharynx clear; neck supple, no JVP Lymph- no cervical lymphadenopathy Lungs- Clear to ausculation bilaterally, normal work of breathing.  No wheezes, rales, rhonchi Heart- Regular rate and rhythm, soft SM, rubs or gallops, PMI not laterally displaced GI- soft, non-tender, non-distended Extremities- no clubbing, cyanosis, or edema MS- no significant deformity or atrophy Skin- warm and dry, no rash or lesion Psych- euthymic mood, full affect Neuro- no gross deficits observed  Labs:  Lab Results  Component Value Date   WBC 8.8 12/12/2016   HGB 16.4 12/12/2016   HCT 51.9 12/12/2016   MCV 86.5 12/12/2016   PLT 131 (L) 12/12/2016    Recent Labs Lab 12/12/16 0944  NA 140  K 4.2  CL 106  CO2 26  BUN 21*  CREATININE 1.48*  CALCIUM 9.0  GLUCOSE 103*      Radiology/Studies:  Dg Chest 2 View Result Date: 12/12/2016 CLINICAL DATA:  Cough, shortness of breath, and chest congestion for several days. History of COPD, left upper lobe mass, coronary artery disease. EXAM: CHEST  2 VIEW COMPARISON:  CT scan of the chest of September 20, 2016 and chest x-ray dated August 30, 2016. FINDINGS: The lungs remain mildly hyperinflated. There is a small right pleural effusion blunting the costophrenic angles. There is an abnormal soft tissue mass in the left suprahilar region CXR which has been previously described. The heart and pulmonary vascularity are normal. There is calcification in the wall of the aortic arch. The ICD is in stable position. The bony thorax exhibits no acute abnormality. IMPRESSION: Persistent left upper lobe mass suspicious for malignancy. Please see the dictation of the chest CT scan of September 20, 2016. Small right pleural effusion more conspicuous than in the past. No pulmonary edema. Thoracic aortic atherosclerosis. Electronically Signed    By: David  Martinique M.D.   On: 12/12/2016 10:01    EKG: Last is SR, RBBB EMS EKG's tracings reviewed, SVT 170bpm, >> ST 121bpm TELEMETRY: SR 70's  08/31/16: TTE Study Conclusions - Left ventricle: The cavity size was mildly dilated. Wall   thickness was normal. Systolic function was mildly reduced. The   estimated ejection fraction was in the range of 45% to 50%. There   is akinesis of the apical myocardium. There is akinesis of the   midanteroseptal myocardium. Doppler parameters are consistent   with abnormal left ventricular relaxation (grade 1 diastolic   dysfunction). There was an apicalthrombus. - Aortic valve: There was trivial regurgitation. - Aorta: Aortic root dimension: 40 mm (ED). Ascending aortic   diameter: 41 mm (S). - Aortic root: The aortic root was mildly dilated. - Ascending aorta: The ascending aorta was mildly dilated. - Pulmonic valve: There was no regurgitation. Impressions: - Definity used; apical akinesis with overall mildly reduced LV   systolic function; grade 1 diastolic dysfunction; apical   thrombus; trace AI; mildly dilated aortic root;   Assessment and Plan:   1. SVT (known to have)     Recommend up-titration of his metoprolol       In discussion with Dr. Curt Bears, the patient takes his medicines at irregular hours/intervals, sometimes as many as 4 hours different bot day/night doses.       Given this recommend Toprol XL '100mg'$  BID     I have messaged EP scheduler to arrange follow up with Dr, Caryl Comes  2. CAD     No c/o CP  3. ICM     Exam appears compensated by exam  4. COPD     Suspicious lung mass     The patient reports following with Dr. [name]   Encouraged routine follow up with all of his doctors, importance of this.     Venetia Night, PA-C 12/12/2016 12:29 PM   I have seen and examined this patient with Tommye Standard.  Agree with above, note added to reflect my findings.  On exam, regular rhythm, no murmurs, lungs clear.  Presented to the hospital  with shortness of breath, found to be in a wide complex tachycardia by EMS. After receiving adenosine and Lopressor, the patient converted back to sinus tachycardia.  The wide-complex tachycardia QRS was similar to the patient's QRS in sinus rhythm, and thus it is likely that this was due to an SVT. According to the patient, he did get to take his morning dose of beta blockers. Due to his episode of SVT and shortness of breath, we'll plan to increase his metoprolol to Toprol-XL 100 mg twice a day. He does say that he takes his medications at different times throughout the day, and hopefully the extended-release formulation Mardie Kellen give him overlap so that he does not go without drug. We'll have him follow-up in EP device clinic.  Yamato Kopf M. Meiling Hendriks MD 12/12/2016 3:41 PM

## 2016-12-12 NOTE — ED Notes (Signed)
Pt. Ambulatory to restroom with steady gait.

## 2016-12-12 NOTE — ED Notes (Signed)
Interrogating Medtronic ICD at this time

## 2016-12-12 NOTE — ED Provider Notes (Addendum)
[place] DEPT Provider Note   CSN: [id number] Arrival date & time: 12/12/16  0803     History   Chief Complaint Chief Complaint  Patient presents with  . Shortness of Breath    HPI Jesus Sanders is a 81 y.o. male. CC: Shortness of breath  HPI:  He presents with dyspnea since awakening this morning. He states he had a normal day yesterday. He did not wake up symptomatically during the night. Sometime before 7 AM he awakened feeling short of breath. No palpitations. No chest pain or breathlessness. Minimal cough. No symptoms over the last few days. Has a history of COPD, new diagnosis of left upper lobe lung mass, coronary artery disease, has implanted single lead pacer defibrillator. Did not feel any delivered shock this morning.  Past Medical History:  Diagnosis Date  . AAA (abdominal aortic aneurysm) ([place])    a. CT 07/2015 - mild aneurysmal dilatation of the distal abdominal aorta measuring 3.2 x 3.4 cm.  Marland Kitchen CAD (coronary artery disease)    a. 07/2011 Anterior apical STEMI/Cath/PCI: LM nl, LAD 100p/m (2.5 x 24m Mini-Vision BMS), LCX 40p, RCA dominant, nl, EF 30%.  . Chronic bronchitis   . Chronic respiratory failure ([place]   . COPD (chronic obstructive pulmonary disease) ([place]   . Dilatation of thoracic aorta ([place]    a. CT 07/2015 - aneurysmal dilatation of the descending thoracic aorta measuring 3.9 cm in greatest diameter.  . Duodenal perforation ([facility] June 2012  . GERD (gastroesophageal reflux disease)   . History of DVT (deep vein thrombosis)   . Hypertension   . Ischemic cardiomyopathy    a. 01/2012 S/P MDT Protecta single lead ICD, ser # [id number]  . Myocardial infarction   . Peritonitis ([facility] June 2012  . Pneumonia April 2012  . Polycythemia vera(238.4)    a. [facility] [place] [facility] [place]  will restart his phlebotomies from about Mar 15 2011  . Popliteal aneurysm ([place]   . Pulmonary embolism ([place]    a. Diagnosed  07/2015 - placed on Eliquis.  . Shortness of breath   . Stomach ulcer   . Systolic CHF, chronic ([place]    a. 12/2011 Echo: EF 25-30%, mid-dist antsept/inf, apical AK, Gr 1 DD, Triv AI, Mild MR.  . Tobacco abuse    a. cigars    Patient Active Problem List   Diagnosis Date Noted  . Thrombus of pulmonary vein ([place]   . Supraventricular dysrhythmia   . Acute on chronic respiratory failure with hypoxia ([place]   . Lung mass 08/30/2016  . 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 COPD exacerbation ([place] 07/19/2015  . Acute respiratory failure ([place] 07/19/2015  . Dyspnea 02/17/2015  . Acute on chronic respiratory failure with hypoxemia ([facility] 02/17/2015  . COPD GOLD  IV 05/17/2014  . Acute on chronic systolic CHF (congestive heart failure) ([place] 05/16/2013  . Environmental allergies 04/13/2012  . Automatic implantable cardioverter-defibrillator- medtronic 04/11/2012  . Hyperlipidemia 02/27/2012  . GERD (gastroesophageal reflux disease) 01/12/2012  . Ischemic cardiomyopathy 01/04/2012  . Essential hypertension 09/06/2011  . Ecchymosis 08/16/2011  . CAD (coronary artery disease) 08/02/2011  . Chronic systolic heart failure ([place] 08/02/2011  . Acute stomach ulcer 07/07/2011  . Duodenal perforation ([place] 06/23/2011  . Bruises easily 05/03/2011  . Polycythemia vera ([place] 03/11/2011    Past Surgical History:  Procedure Laterality Date  . CARDIAC CATHETERIZATION  Sept 2012   Normal left main, occluded  LAD, 40% LCX and normal RCA. EF is 30%  . CARDIAC DEFIBRILLATOR PLACEMENT     single chamber  . CHOLECYSTECTOMY  03/2011   Dr. Marlou Starks  . COLON SURGERY    . IMPLANTABLE CARDIOVERTER DEFIBRILLATOR IMPLANT N/A 01/11/2012   Procedure: IMPLANTABLE CARDIOVERTER DEFIBRILLATOR IMPLANT;  Surgeon: Deboraha Sprang, MD;  Location: [facility] C[facility] LAB;  Service: Cardiovascular;  Laterality: N/A; Medtronic  . PERIPHERAL VASCULAR CATHETERIZATION N/A 08/05/2015   Procedure: Lower Extremity  Angiography;  Surgeon: Serafina Mitchell, MD;  Location: [place] CV LAB;  Service: Cardiovascular;  Laterality: N/A;  . SHOULDER ARTHROSCOPY     left, rotatotor cuff tendinopathy  . US ECHOCARDIOGRAPHY  Sept 2012   EF 25 to 30% with akinesis of the mid to distal anterior and apical myocardium, trivial AI and no apical thrombus       Home Medications    Prior to Admission medications   Medication Sig Start Date End Date Taking? Authorizing Provider  aspirin EC 81 MG EC tablet Take 1 tablet (81 mg total) by mouth daily. 09/02/16  Yes Eloise Levels, MD  BROVANA 15 MCG/2ML NEBU USE ONE VIAL IN NEBULIZER TWICE DAILY 09/20/16  Yes Tanda Rockers, MD  budesonide (PULMICORT) 0.25 MG/2ML nebulizer solution USE ONE VIAL IN NEBULIZER TWICE DAILY 08/04/15  Yes Tanda Rockers, MD  furosemide (LASIX) 40 MG tablet Take 40 mg by mouth daily.   Yes Historical Provider, MD  lovastatin (MEVACOR) 20 MG tablet Take 20 mg by mouth at bedtime.   Yes Historical Provider, MD  metoprolol (LOPRESSOR) 50 MG tablet Take 25 mg by mouth 2 (two) times daily.   Yes Historical Provider, MD  ranitidine (ZANTAC) 150 MG tablet Take 150 mg by mouth daily.   Yes Historical Provider, MD  metoprolol succinate (TOPROL-XL) 100 MG 24 hr tablet Take 1 tablet (100 mg total) by mouth daily. 12/12/16   Tanna Furry, MD  valsartan (DIOVAN) 160 MG tablet Take 1 tablet (160 mg total) by mouth daily. Patient not taking: Reported on 12/12/2016 09/23/16   Tanda Rockers, MD    Family History Family History  Problem Relation Age of Onset  . Lung disease Father     [name]- worked at a [employer] History  Substance Use Topics  . Smoking status: Former Smoker    Packs/day: 0.50    Years: 60.00    Types: Cigars    Quit date: 05/18/2013  . Smokeless tobacco: Current User    Types: Chew  . Alcohol use Yes     Comment: rarely      Allergies   Patient has no known allergies.   Review of  Systems Review of Systems  Constitutional: Negative for appetite change, chills, diaphoresis, fatigue and fever.  HENT: Negative for mouth sores, sore throat and trouble swallowing.   Eyes: Negative for visual disturbance.  Respiratory: Positive for cough and shortness of breath. Negative for chest tightness and wheezing.   Cardiovascular: Negative for chest pain.  Gastrointestinal: Negative for abdominal distention, abdominal pain, diarrhea, nausea and vomiting.  Endocrine: Negative for polydipsia, polyphagia and polyuria.  Genitourinary: Negative for dysuria, frequency and hematuria.  Musculoskeletal: Negative for gait problem.  Skin: Negative for color change, pallor and rash.  Neurological: Negative for dizziness, syncope, light-headedness and headaches.  Hematological: Does not bruise/bleed easily.  Psychiatric/Behavioral: Negative for behavioral problems and confusion.     Physical Exam Updated Vital Signs BP 103/71  Pulse 74   Temp 98.1 F (36.7 C) (Oral)   Resp 25   SpO2 91%   Physical Exam  Constitutional: He is oriented to person, place, and time. He appears well-developed and well-nourished. No distress.  HENT:  Head: Normocephalic.  Eyes: Conjunctivae are normal. Pupils are equal, round, and reactive to light. No scleral icterus.  Neck: Normal range of motion. Neck supple. No thyromegaly present.  Cardiovascular: Normal rate and regular rhythm.  Exam reveals no gallop and no friction rub.   No murmur heard. Sinus tachycardia 105.  Pulmonary/Chest: Effort normal and breath sounds normal. No respiratory distress. He has no wheezes. He has no rales.  Abdominal: Soft. Bowel sounds are normal. He exhibits no distension. There is no tenderness. There is no rebound.  Musculoskeletal: Normal range of motion.  Neurological: He is alert and oriented to person, place, and time.  Skin: Skin is warm and dry. No rash noted.  Psychiatric: He has a normal mood and affect. His  behavior is normal.     ED Treatments / Results  Labs (all labs ordered are listed, but only abnormal results are displayed) Labs Reviewed  CBC WITH DIFFERENTIAL/PLATELET - Abnormal; Notable for the following:       Result Value   RBC 6.00 (*)    RDW 16.1 (*)    Platelets 131 (*)    All other components within normal limits  BASIC METABOLIC PANEL - Abnormal; Notable for the following:    Glucose, Bld 103 (*)    BUN 21 (*)    Creatinine, Ser 1.48 (*)    GFR calc non Af Amer 44 (*)    GFR calc Af Amer 50 (*)    All other components within normal limits  TSH  I-STAT TROPOININ, ED    EKG  EKG Interpretation None       Radiology Dg Chest 2 View  Result Date: 12/12/2016 CLINICAL DATA:  Cough, shortness of breath, and chest congestion for several days. History of COPD, left upper lobe mass, coronary artery disease. EXAM: CHEST  2 VIEW COMPARISON:  CT scan of the chest of September 20, 2016 and chest x-ray dated August 30, 2016. FINDINGS: The lungs remain mildly hyperinflated. There is a small right pleural effusion blunting the costophrenic angles. There is an abnormal soft tissue mass in the left suprahilar region CXR which has been previously described. The heart and pulmonary vascularity are normal. There is calcification in the wall of the aortic arch. The ICD is in stable position. The bony thorax exhibits no acute abnormality. IMPRESSION: Persistent left upper lobe mass suspicious for malignancy. Please see the dictation of the chest CT scan of September 20, 2016. Small right pleural effusion more conspicuous than in the past. No pulmonary edema. Thoracic aortic atherosclerosis. Electronically Signed   By: David  Martinique M.D.   On: 12/12/2016 10:01    Procedures Procedures (including critical care time)  Medications Ordered in ED Medications  metoprolol (LOPRESSOR) injection 5 mg (5 mg Intravenous Given 12/12/16 0922)  sodium chloride 0.9 % bolus 500 mL (0 mLs Intravenous  Stopped 12/12/16 1130)     Initial Impression / Assessment and Plan / ED Course  I have reviewed the triage vital signs and the nursing notes.  Pertinent labs & imaging results that were available during my care of the patient were reviewed by me and considered in my medical decision making (see chart for details).     EKG shows what appears to be sinus  tachycardia with white complex. Regular. Rate 118. At the bedside is 105.  Review of present EKG shows a white complex rapid rhythm rate varying between 140-165. Was given adenosine 2 by paramedics. With 6 mg had no change. With 12 mg had a brief pause, followed by a few escape beats, then resumption of Y complex tachycardia.  Currently he has a wide-complex tachycardic rhythm rate 106. He has excellent perfusion. He is asymptomatic stating he feels back to normal.  First diagnosis would include SVT with aberrancy including A. fib. He is regular. Atrial flutter with 21 block and aberrant conduction, VT is a possibility.  Patient's pacer defibrillator was interrogated. I received a call that the patient had 4 distinct episodes of VT this morning. 14 minutes, followed by 15 minutes, followed by 12 minutes, followed by 2 minutes. All with a 1 minute reprieve in between.  He is stable. Blood pressure 110. Heart rate 110. Was given 1 dose of IV Lopressor for his rate.   Is given 500 of fluid now for pressure of 90. Is not symptomatically palpitations or dyspnea. I have asked cardiology to consult.  Final Clinical Impressions(s) / ED Diagnoses   Final diagnoses:  Ventricular tachycardia ([place])  Atrial flutter, unspecified type [facility])    Patient seen by cardiology. Thought to be atrial flutter. Metoprolol prescription given. Discharge per cardiology recommendations with AF clinic follow-up  New Prescriptions New Prescriptions   METOPROLOL SUCCINATE (TOPROL-XL) 100 MG 24 HR TABLET    Take 1 tablet (100 mg total) by mouth daily.     Tanna Furry, MD 12/12/16 [place], MD 12/12/16 [phone]

## 2016-12-12 NOTE — ED Notes (Signed)
Cardiology recommending increase metoprolol '100mg'$  BID and follow up with Dr. Cleda Mccreedy. EDP made aware.

## 2016-12-12 NOTE — Addendum Note (Signed)
Addended by: Stanton Kidney on: 12/12/2016 05:40 PM   Modules accepted: Orders

## 2016-12-12 NOTE — Discharge Instructions (Signed)
Top your current metoprolol dose.  Prescription for Toprol (long-acting metoprolol).  Your cardiologist office will call you with a follow-up clinic time

## 2016-12-12 NOTE — ED Notes (Signed)
Cardiology PA at bedside. 

## 2016-12-12 NOTE — ED Notes (Signed)
Patient transported to X-ray 

## 2016-12-12 NOTE — ED Notes (Signed)
EKG given to Dr. James  

## 2016-12-27 ENCOUNTER — Encounter: Payer: Self-pay | Admitting: Internal Medicine

## 2016-12-27 ENCOUNTER — Ambulatory Visit (INDEPENDENT_AMBULATORY_CARE_PROVIDER_SITE_OTHER): Payer: Medicare Other | Admitting: Internal Medicine

## 2016-12-27 VITALS — BP 110/60 | HR 57 | Ht 72.0 in | Wt 211.8 lb

## 2016-12-27 DIAGNOSIS — I2589 Other forms of chronic ischemic heart disease: Secondary | ICD-10-CM | POA: Diagnosis not present

## 2016-12-27 DIAGNOSIS — I5022 Chronic systolic (congestive) heart failure: Secondary | ICD-10-CM | POA: Diagnosis not present

## 2016-12-27 DIAGNOSIS — I255 Ischemic cardiomyopathy: Secondary | ICD-10-CM | POA: Diagnosis not present

## 2016-12-27 DIAGNOSIS — Z95810 Presence of automatic (implantable) cardiac defibrillator: Secondary | ICD-10-CM | POA: Diagnosis not present

## 2016-12-27 NOTE — Progress Notes (Signed)
Patient Care Team: Leonard Downing, MD as PCP - General (Family Medicine)   HPI  Jesus Sanders is a 81 y.o. male is seen in followup for an ICD implanted spring 2013 for ischemic cardiac myopathy prior MI and congestive failure.   The patient denies chest pain, shortness of breath, nocturnal dyspnea, orthopnea or peripheral edema.  He was seen in the emergency room 12/12/16 for tachycardia and was found to have SVT.  Beta blockers were increased.   He denies chest pain or edema  He has chronic COPD-severe with home oxygen.       Past Medical History:  Diagnosis Date  . AAA (abdominal aortic aneurysm) ([place])    a. CT 07/2015 - mild aneurysmal dilatation of the distal abdominal aorta measuring 3.2 x 3.4 cm.  Marland Kitchen CAD (coronary artery disease)    a. 07/2011 Anterior apical STEMI/Cath/PCI: LM nl, LAD 100p/m (2.5 x 45m Mini-Vision BMS), LCX 40p, RCA dominant, nl, EF 30%.  . Chronic bronchitis   . Chronic respiratory failure ([place]   . COPD (chronic obstructive pulmonary disease) ([place]   . Dilatation of thoracic aorta ([place]    a. CT 07/2015 - aneurysmal dilatation of the descending thoracic aorta measuring 3.9 cm in greatest diameter.  . Duodenal perforation ([facility] June 2012  . GERD (gastroesophageal reflux disease)   . History of DVT (deep vein thrombosis)   . Hypertension   . Ischemic cardiomyopathy    a. 01/2012 S/P MDT Protecta single lead ICD, ser # [id number]  . Myocardial infarction   . Peritonitis ([facility] June 2012  . Pneumonia April 2012  . Polycythemia vera(238.4)    a. [place]  will restart his phlebotomies from about Mar 15 2011  . Popliteal aneurysm ([place]   . Pulmonary embolism ([place]    a. Diagnosed 07/2015 - placed on Eliquis.  . Shortness of breath   . Stomach ulcer   . Systolic CHF, chronic ([place]    a. 12/2011 Echo: EF 25-30%, mid-dist antsept/inf, apical AK, Gr 1 DD, Triv AI, Mild MR.  . Tobacco abuse     a. cigars    Past Surgical History:  Procedure Laterality Date  . CARDIAC CATHETERIZATION  Sept 2012   Normal left main, occluded LAD, 40% LCX and normal RCA. EF is 30%  . CARDIAC DEFIBRILLATOR PLACEMENT     single chamber  . CHOLECYSTECTOMY  03/2011   Dr. TMarlou Starks . COLON SURGERY    . IMPLANTABLE CARDIOVERTER DEFIBRILLATOR IMPLANT N/A 01/11/2012   Procedure: IMPLANTABLE CARDIOVERTER DEFIBRILLATOR IMPLANT;  Surgeon: SDeboraha Sprang MD;  [facility];  Service: Cardiovascular;  Laterality: N/A; Medtronic  . PERIPHERAL VASCULAR CATHETERIZATION N/A 08/05/2015   Procedure: Lower Extremity Angiography;  Surgeon: VSerafina Mitchell MD;  [place];  Service: Cardiovascular;  Laterality: N/A;  . SHOULDER ARTHROSCOPY     left, rotatotor cuff tendinopathy  . [place]GRAPHY  Sept 2012   EF 25 to 30% with akinesis of the mid to distal anterior and apical myocardium, trivial AI and no apical thrombus    Current Outpatient Prescriptions  Medication Sig Dispense Refill  . aspirin EC 81 MG EC tablet Take 1 tablet (81 mg total) by mouth daily. 30 tablet 0  . BROVANA 15 MCG/2ML NEBU USE ONE VIAL IN NEBULIZER TWICE DAILY 120 mL 11  . budesonide (PULMICORT) 0.25 MG/2ML nebulizer solution USE ONE VIAL IN NEBULIZER TWICE DAILY 120 mL  11  . furosemide (LASIX) 40 MG tablet Take 40 mg by mouth daily.    Marland Kitchen lovastatin (MEVACOR) 20 MG tablet Take 20 mg by mouth at bedtime.    . metoprolol (LOPRESSOR) 50 MG tablet Take 25 mg by mouth 2 (two) times daily.    . metoprolol succinate (TOPROL-XL) 100 MG 24 hr tablet Take 1 tablet (100 mg total) by mouth 2 (two) times daily. 60 tablet 6  . ranitidine (ZANTAC) 150 MG tablet Take 150 mg by mouth daily.     No current facility-administered medications for this visit.     No Known Allergies  Review of Systems negative except from HPI and PMH  Physical Exam BP 110/60   Pulse (!) 57   Ht 6' (1.829 m)   Wt 211 lb 12.8 oz (96.1 kg)    SpO2 90%   BMI 28.73 kg/m  Well developed and well nourished in no acute distress wearing O2 HENT normal E scleral and icterus clear Neck Supple Clear to ausculation  Regular rate and rhythm, no murmurs gallops or rub Soft with active bowel sounds No clubbing cyanosis 1+ Edema Alert and oriented, grossly normal motor and sensory function Skin Warm and Dry  Electrocardiogram dated today demonstrates sinus rhythm with a rate of 63 Interval 17/10/41 prior anterolateral infarct  Assessment and  Plan  Ischemic cardiomyopathy  Implantable defibrillator . 4. he patient's device was interrogated.  The information was reviewed. No changes were made in the programming.     sVT    congestive heart failure-chronic systolic  There is trivial peripheral edema. His optivol is up a little bit but is on its way back down. We interrogated the device and that the episodes of SVT from the hospital and concur. We have discussed that he may likely recur in fact there was an episode the day after hospital discharge. We have reprogrammed the device to monitor rates faster than 150 in the hopes of trying to get a better sense of the burden of his SVT as he had no associated palpitations.  Without symptoms of ischemia

## 2016-12-27 NOTE — Patient Instructions (Signed)
Medication Instructions: - Your physician recommends that you continue on your current medications as directed. Please refer to the Current Medication list given to you today.  Labwork: - none ordered  Procedures/Testing: - none ordered  Follow-Up: - Your physician recommends that you schedule a follow-up appointment in: 2 months with Dr. Martinique  - Your physician recommends that you schedule a follow-up appointment in: 3 months with Chanetta Marshall, NP for Dr. Caryl Comes.     Any Additional Special Instructions Will Be Listed Below (If Applicable).     If you need a refill on your cardiac medications before your next appointment, please call your pharmacy.

## 2016-12-28 LAB — CUP PACEART INCLINIC DEVICE CHECK
Implantable Lead Implant Date: 20130306
Implantable Lead Model: 6935
Implantable Pulse Generator Implant Date: 20130306
Lead Channel Pacing Threshold Amplitude: 0.5 V
Lead Channel Pacing Threshold Pulse Width: 0.4 ms
Lead Channel Sensing Intrinsic Amplitude: 6.1 mV
MDC IDC LEAD LOCATION: 753860
MDC IDC SESS DTM: 20180221130558

## 2017-01-02 ENCOUNTER — Ambulatory Visit: Payer: Medicare Other | Admitting: Internal Medicine

## 2017-02-09 ENCOUNTER — Other Ambulatory Visit: Payer: Self-pay | Admitting: Internal Medicine

## 2017-02-11 ENCOUNTER — Encounter (HOSPITAL_COMMUNITY): Payer: Self-pay | Admitting: Emergency Medicine

## 2017-02-11 ENCOUNTER — Emergency Department (HOSPITAL_COMMUNITY): Payer: Medicare Other

## 2017-02-11 ENCOUNTER — Inpatient Hospital Stay (HOSPITAL_COMMUNITY)
Admission: EM | Admit: 2017-02-11 | Discharge: 2017-03-07 | DRG: 853 | Disposition: E | Payer: Medicare Other | Attending: Family Medicine | Admitting: Family Medicine

## 2017-02-11 DIAGNOSIS — R0600 Dyspnea, unspecified: Secondary | ICD-10-CM

## 2017-02-11 DIAGNOSIS — E870 Hyperosmolality and hypernatremia: Secondary | ICD-10-CM | POA: Diagnosis not present

## 2017-02-11 DIAGNOSIS — L89321 Pressure ulcer of left buttock, stage 1: Secondary | ICD-10-CM | POA: Clinically undetermined

## 2017-02-11 DIAGNOSIS — K668 Other specified disorders of peritoneum: Secondary | ICD-10-CM

## 2017-02-11 DIAGNOSIS — K659 Peritonitis, unspecified: Secondary | ICD-10-CM | POA: Diagnosis present

## 2017-02-11 DIAGNOSIS — K660 Peritoneal adhesions (postprocedural) (postinfection): Secondary | ICD-10-CM | POA: Diagnosis present

## 2017-02-11 DIAGNOSIS — I493 Ventricular premature depolarization: Secondary | ICD-10-CM | POA: Diagnosis not present

## 2017-02-11 DIAGNOSIS — I714 Abdominal aortic aneurysm, without rupture, unspecified: Secondary | ICD-10-CM

## 2017-02-11 DIAGNOSIS — Z9981 Dependence on supplemental oxygen: Secondary | ICD-10-CM

## 2017-02-11 DIAGNOSIS — I110 Hypertensive heart disease with heart failure: Secondary | ICD-10-CM | POA: Diagnosis present

## 2017-02-11 DIAGNOSIS — I5022 Chronic systolic (congestive) heart failure: Secondary | ICD-10-CM | POA: Diagnosis present

## 2017-02-11 DIAGNOSIS — Z86718 Personal history of other venous thrombosis and embolism: Secondary | ICD-10-CM

## 2017-02-11 DIAGNOSIS — R000 Tachycardia, unspecified: Secondary | ICD-10-CM

## 2017-02-11 DIAGNOSIS — K769 Liver disease, unspecified: Secondary | ICD-10-CM | POA: Diagnosis present

## 2017-02-11 DIAGNOSIS — L8990 Pressure ulcer of unspecified site, unspecified stage: Secondary | ICD-10-CM | POA: Insufficient documentation

## 2017-02-11 DIAGNOSIS — E861 Hypovolemia: Secondary | ICD-10-CM | POA: Diagnosis not present

## 2017-02-11 DIAGNOSIS — I252 Old myocardial infarction: Secondary | ICD-10-CM

## 2017-02-11 DIAGNOSIS — D45 Polycythemia vera: Secondary | ICD-10-CM | POA: Diagnosis present

## 2017-02-11 DIAGNOSIS — E872 Acidosis, unspecified: Secondary | ICD-10-CM | POA: Diagnosis present

## 2017-02-11 DIAGNOSIS — K631 Perforation of intestine (nontraumatic): Secondary | ICD-10-CM | POA: Diagnosis present

## 2017-02-11 DIAGNOSIS — Z7982 Long term (current) use of aspirin: Secondary | ICD-10-CM

## 2017-02-11 DIAGNOSIS — F17210 Nicotine dependence, cigarettes, uncomplicated: Secondary | ICD-10-CM | POA: Diagnosis present

## 2017-02-11 DIAGNOSIS — Z8711 Personal history of peptic ulcer disease: Secondary | ICD-10-CM

## 2017-02-11 DIAGNOSIS — C3490 Malignant neoplasm of unspecified part of unspecified bronchus or lung: Secondary | ICD-10-CM | POA: Diagnosis present

## 2017-02-11 DIAGNOSIS — I10 Essential (primary) hypertension: Secondary | ICD-10-CM | POA: Diagnosis present

## 2017-02-11 DIAGNOSIS — R1032 Left lower quadrant pain: Secondary | ICD-10-CM

## 2017-02-11 DIAGNOSIS — A419 Sepsis, unspecified organism: Secondary | ICD-10-CM | POA: Diagnosis not present

## 2017-02-11 DIAGNOSIS — Z515 Encounter for palliative care: Secondary | ICD-10-CM

## 2017-02-11 DIAGNOSIS — K9189 Other postprocedural complications and disorders of digestive system: Secondary | ICD-10-CM | POA: Clinically undetermined

## 2017-02-11 DIAGNOSIS — J181 Lobar pneumonia, unspecified organism: Secondary | ICD-10-CM | POA: Diagnosis present

## 2017-02-11 DIAGNOSIS — I4891 Unspecified atrial fibrillation: Secondary | ICD-10-CM | POA: Diagnosis not present

## 2017-02-11 DIAGNOSIS — Z66 Do not resuscitate: Secondary | ICD-10-CM | POA: Diagnosis not present

## 2017-02-11 DIAGNOSIS — N179 Acute kidney failure, unspecified: Secondary | ICD-10-CM | POA: Diagnosis not present

## 2017-02-11 DIAGNOSIS — J9621 Acute and chronic respiratory failure with hypoxia: Secondary | ICD-10-CM | POA: Diagnosis not present

## 2017-02-11 DIAGNOSIS — I472 Ventricular tachycardia: Secondary | ICD-10-CM

## 2017-02-11 DIAGNOSIS — I255 Ischemic cardiomyopathy: Secondary | ICD-10-CM | POA: Diagnosis present

## 2017-02-11 DIAGNOSIS — Z95820 Peripheral vascular angioplasty status with implants and grafts: Secondary | ICD-10-CM

## 2017-02-11 DIAGNOSIS — K285 Chronic or unspecified gastrojejunal ulcer with perforation: Secondary | ICD-10-CM | POA: Diagnosis present

## 2017-02-11 DIAGNOSIS — R6521 Severe sepsis with septic shock: Secondary | ICD-10-CM | POA: Diagnosis present

## 2017-02-11 DIAGNOSIS — Z452 Encounter for adjustment and management of vascular access device: Secondary | ICD-10-CM

## 2017-02-11 DIAGNOSIS — K567 Ileus, unspecified: Secondary | ICD-10-CM | POA: Diagnosis not present

## 2017-02-11 DIAGNOSIS — D72829 Elevated white blood cell count, unspecified: Secondary | ICD-10-CM

## 2017-02-11 DIAGNOSIS — I452 Bifascicular block: Secondary | ICD-10-CM | POA: Diagnosis present

## 2017-02-11 DIAGNOSIS — J9690 Respiratory failure, unspecified, unspecified whether with hypoxia or hypercapnia: Secondary | ICD-10-CM

## 2017-02-11 DIAGNOSIS — Z0189 Encounter for other specified special examinations: Secondary | ICD-10-CM

## 2017-02-11 DIAGNOSIS — J440 Chronic obstructive pulmonary disease with acute lower respiratory infection: Secondary | ICD-10-CM | POA: Diagnosis present

## 2017-02-11 DIAGNOSIS — K219 Gastro-esophageal reflux disease without esophagitis: Secondary | ICD-10-CM | POA: Diagnosis present

## 2017-02-11 DIAGNOSIS — Z86711 Personal history of pulmonary embolism: Secondary | ICD-10-CM

## 2017-02-11 DIAGNOSIS — Z9049 Acquired absence of other specified parts of digestive tract: Secondary | ICD-10-CM

## 2017-02-11 DIAGNOSIS — I2510 Atherosclerotic heart disease of native coronary artery without angina pectoris: Secondary | ICD-10-CM | POA: Diagnosis present

## 2017-02-11 DIAGNOSIS — Z95810 Presence of automatic (implantable) cardiac defibrillator: Secondary | ICD-10-CM | POA: Diagnosis present

## 2017-02-11 DIAGNOSIS — J189 Pneumonia, unspecified organism: Secondary | ICD-10-CM | POA: Diagnosis present

## 2017-02-11 DIAGNOSIS — I739 Peripheral vascular disease, unspecified: Secondary | ICD-10-CM | POA: Diagnosis present

## 2017-02-11 DIAGNOSIS — I471 Supraventricular tachycardia: Secondary | ICD-10-CM | POA: Diagnosis present

## 2017-02-11 LAB — CBC WITH DIFFERENTIAL/PLATELET
BASOS ABS: 0 10*3/uL (ref 0.0–0.1)
BASOS PCT: 0 %
EOS ABS: 0.2 10*3/uL (ref 0.0–0.7)
Eosinophils Relative: 2 %
HEMATOCRIT: 51 % (ref 39.0–52.0)
HEMOGLOBIN: 16.1 g/dL (ref 13.0–17.0)
Lymphocytes Relative: 14 %
Lymphs Abs: 1.1 10*3/uL (ref 0.7–4.0)
MCH: 27.8 pg (ref 26.0–34.0)
MCHC: 31.6 g/dL (ref 30.0–36.0)
MCV: 88.1 fL (ref 78.0–100.0)
Monocytes Absolute: 0.5 10*3/uL (ref 0.1–1.0)
Monocytes Relative: 7 %
NEUTROS ABS: 5.8 10*3/uL (ref 1.7–7.7)
NEUTROS PCT: 77 %
Platelets: 129 10*3/uL — ABNORMAL LOW (ref 150–400)
RBC: 5.79 MIL/uL (ref 4.22–5.81)
RDW: 15.7 % — ABNORMAL HIGH (ref 11.5–15.5)
WBC: 7.5 10*3/uL (ref 4.0–10.5)

## 2017-02-11 LAB — COMPREHENSIVE METABOLIC PANEL
ALT: 9 U/L — ABNORMAL LOW (ref 17–63)
ANION GAP: 5 (ref 5–15)
AST: 22 U/L (ref 15–41)
Albumin: 3.1 g/dL — ABNORMAL LOW (ref 3.5–5.0)
Alkaline Phosphatase: 83 U/L (ref 38–126)
BILIRUBIN TOTAL: 1.3 mg/dL — AB (ref 0.3–1.2)
BUN: 20 mg/dL (ref 6–20)
CALCIUM: 9 mg/dL (ref 8.9–10.3)
CO2: 31 mmol/L (ref 22–32)
Chloride: 104 mmol/L (ref 101–111)
Creatinine, Ser: 1.24 mg/dL (ref 0.61–1.24)
GFR calc Af Amer: >60 mL/min (ref >60)
GFR calc non Af Amer: 54 mL/min — ABNORMAL LOW (ref >60)
Glucose, Bld: 108 mg/dL — ABNORMAL HIGH (ref 65–99)
POTASSIUM: 5.2 mmol/L — AB (ref 3.5–5.1)
Sodium: 140 mmol/L (ref 135–145)
TOTAL PROTEIN: 6.3 g/dL — AB (ref 6.5–8.1)

## 2017-02-11 LAB — URINALYSIS, ROUTINE W REFLEX MICROSCOPIC
Bilirubin Urine: NEGATIVE
Glucose, UA: NEGATIVE mg/dL
KETONES UR: NEGATIVE mg/dL
LEUKOCYTES UA: NEGATIVE
NITRITE: NEGATIVE
PH: 5 (ref 5.0–8.0)
Protein, ur: 30 mg/dL — AB
Specific Gravity, Urine: 1.021 (ref 1.005–1.030)

## 2017-02-11 LAB — I-STAT TROPONIN, ED: TROPONIN I, POC: 0 ng/mL (ref 0.00–0.08)

## 2017-02-11 LAB — BRAIN NATRIURETIC PEPTIDE: B NATRIURETIC PEPTIDE 5: 107.5 pg/mL — AB (ref 0.0–100.0)

## 2017-02-11 LAB — LIPASE, BLOOD: LIPASE: 16 U/L (ref 11–51)

## 2017-02-11 MED ORDER — ONDANSETRON HCL 4 MG/2ML IJ SOLN
4.0000 mg | Freq: Once | INTRAMUSCULAR | Status: AC
  Administered 2017-02-11: 4 mg via INTRAVENOUS
  Filled 2017-02-11: qty 2

## 2017-02-11 MED ORDER — SODIUM CHLORIDE 0.9 % IV BOLUS (SEPSIS)
500.0000 mL | Freq: Once | INTRAVENOUS | Status: AC
Start: 2017-02-11 — End: 2017-02-11
  Administered 2017-02-11: 500 mL via INTRAVENOUS

## 2017-02-11 MED ORDER — LORAZEPAM 2 MG/ML IJ SOLN
0.5000 mg | Freq: Once | INTRAMUSCULAR | Status: AC
  Administered 2017-02-11: 0.5 mg via INTRAVENOUS
  Filled 2017-02-11: qty 1

## 2017-02-11 MED ORDER — FENTANYL CITRATE (PF) 100 MCG/2ML IJ SOLN
50.0000 ug | Freq: Once | INTRAMUSCULAR | Status: AC
  Administered 2017-02-11: 50 ug via INTRAVENOUS
  Filled 2017-02-11: qty 2

## 2017-02-11 MED ORDER — MORPHINE SULFATE (PF) 4 MG/ML IV SOLN
4.0000 mg | Freq: Once | INTRAVENOUS | Status: AC
  Administered 2017-02-11: 4 mg via INTRAVENOUS
  Filled 2017-02-11: qty 1

## 2017-02-11 NOTE — ED Triage Notes (Signed)
Patient arrived with EMS from home reports LLQ pain onset today , denies emesis or diarrhea , received Fentanyl 100 mcg IV by EMS with relief , no pain at arrival . No fever or chills .

## 2017-02-11 NOTE — ED Notes (Signed)
Pt. Returned from CT scan , CT technician advised nurse that pt. became agitated /will no stay on bed during procedure , PA notified that CT scan can not be completed.

## 2017-02-11 NOTE — ED Provider Notes (Signed)
[facility] DEPT Provider Note   CSN: [id number] Arrival date & time: 02/14/2017  1957     History   Chief Complaint Chief Complaint  Patient presents with  . Abdominal Pain    HPI Jesus Sanders is a 81 y.o. male.  Jesus Sanders is a 81 y.o. Male with a history of CHF, COPD, DVT on Eliquis, CAD, and GERD who presents to the ED complaining of LLQ abdominal pain since earlier this morning. Patient reports sharp and shooting left lower quadrant abdominal pain since earlier this morning. He reports his pain is resolved after fentanyl by EMS in route to the emergency department. He denies having any nausea, vomiting or diarrhea. He denies other complaints. Last BM was earlier today and normal. No treatments attempted at home prior to arrival. Previous abdominal surgical history includes a cholecystectomy. He denies fevers, recent illness, chills, urinary symptoms, penile pain, testicular pain, nausea, vomiting, diarrhea, chest pain, shortness of breath or rashes.   The history is provided by the patient and medical records. No language interpreter was used.  Abdominal Pain   Pertinent negatives include fever, diarrhea, nausea, vomiting, constipation, dysuria and headaches.    Past Medical History:  Diagnosis Date  . AAA (abdominal aortic aneurysm) ([place])    a. CT 07/2015 - mild aneurysmal dilatation of the distal abdominal aorta measuring 3.2 x 3.4 cm.  Marland Kitchen CAD (coronary artery disease)    a. 07/2011 Anterior apical STEMI/Cath/PCI: LM nl, LAD 100p/m (2.5 x 67m Mini-Vision BMS), LCX 40p, RCA dominant, nl, EF 30%.  . Chronic bronchitis   . Chronic respiratory failure ([place]   . COPD (chronic obstructive pulmonary disease) ([place]   . Dilatation of thoracic aorta ([place]    a. CT 07/2015 - aneurysmal dilatation of the descending thoracic aorta measuring 3.9 cm in greatest diameter.  . Duodenal perforation ([facility] June 2012  . GERD (gastroesophageal reflux disease)   . History of DVT (deep vein  thrombosis)   . Hypertension   . Ischemic cardiomyopathy    a. 01/2012 S/P MDT Protecta single lead ICD, ser # [id number]  . Myocardial infarction   . Peritonitis ([facility] June 2012  . Pneumonia April 2012  . Polycythemia vera(238.4)    a. [facility] to receive [facility] ph[facility]botomies until 2007[facility]  will restart his phlebotomies from about Mar 15 2011  . Popliteal aneurysm ([place]   . Pulmonary embolism ([place]    a. Diagnosed 07/2015 - placed on Eliquis.  . Shortness of breath   . Stomach ulcer   . Systolic CHF, chronic ([place]    a. 12/2011 Echo: EF 25-30%, mid-dist antsept/inf, apical AK, Gr 1 DD, Triv AI, Mild MR.  . Tobacco abuse    a. cigars    Patient Active Problem List   Diagnosis Date Noted  . Thrombus of pulmonary vein ([place]   . Supraventricular dysrhythmia   . Acute on chronic respiratory failure with hypoxia ([place]   . Lung mass 08/30/2016  . 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 COPD exacerbation ([place] 07/19/2015  . Acute respiratory failure ([place] 07/19/2015  . Dyspnea 02/17/2015  . Acute on chronic respiratory failure with hypoxemia ([place] 02/17/2015  . COPD GOLD  IV 05/17/2014  . Acute on chronic systolic CHF (congestive heart failure) ([place] 05/16/2013  . Environmental allergies 04/13/2012  . Automatic implantable cardioverter-defibrillator- medtronic 04/11/2012  . Hyperlipidemia 02/27/2012  . GERD (gastroesophageal reflux disease) 01/12/2012  . Ischemic cardiomyopathy 01/04/2012  . Essential  hypertension 09/06/2011  . Ecchymosis 08/16/2011  . CAD (coronary artery disease) 08/02/2011  . Chronic systolic heart failure ([place]) 08/02/2011  . Acute stomach ulcer 07/07/2011  . Duodenal perforation ([place]) 06/23/2011  . Bruises easily 05/03/2011  . Polycythemia vera ([place]) 03/11/2011    Past Surgical History:  Procedure Laterality Date  . CARDIAC CATHETERIZATION  Sept 2012   Normal left main, occluded LAD, 40% LCX and normal RCA.  EF is 30%  . CARDIAC DEFIBRILLATOR PLACEMENT     single chamber  . CHOLECYSTECTOMY  03/2011   Dr. Marlou Starks  . COLON SURGERY    . IMPLANTABLE CARDIOVERTER DEFIBRILLATOR IMPLANT N/A 01/11/2012   Procedure: IMPLANTABLE CARDIOVERTER DEFIBRILLATOR IMPLANT;  Surgeon: Deboraha Sprang, MD;  Location: [facility] CATH LAB;  Service: Cardiovascular;  Laterality: N/A; Medtronic  . PERIPHERAL VASCULAR CATHETERIZATION N/A 08/05/2015   Procedure: Lower Extremity Angiography;  Surgeon: Serafina Mitchell, MD;  Location: [facility] CV LAB;  Service: Cardiovascular;  Laterality: N/A;  . SHOULDER ARTHROSCOPY     left, rotatotor cuff tendinopathy  . US ECHOCARDIOGRAPHY  Sept 2012   EF 25 to 30% with akinesis of the mid to distal anterior and apical myocardium, trivial AI and no apical thrombus       Home Medications    Prior to Admission medications   Medication Sig Start Date End Date Taking? Authorizing Provider  aspirin EC 81 MG EC tablet Take 1 tablet (81 mg total) by mouth daily. 09/02/16  Yes Eloise Levels, MD  BROVANA 15 MCG/2ML NEBU USE ONE VIAL IN NEBULIZER TWICE DAILY 09/20/16  Yes Tanda Rockers, MD  budesonide (PULMICORT) 0.25 MG/2ML nebulizer solution USE ONE VIAL IN NEBULIZER TWICE DAILY 08/04/15  Yes Tanda Rockers, MD  furosemide (LASIX) 40 MG tablet Take 20 mg by mouth daily.    Yes Historical Provider, MD  lovastatin (MEVACOR) 20 MG tablet Take 20 mg by mouth at bedtime.   Yes Historical Provider, MD  metoprolol succinate (TOPROL-XL) 100 MG 24 hr tablet Take 1 tablet (100 mg total) by mouth 2 (two) times daily. 12/12/16  Yes Deboraha Sprang, MD  ranitidine (ZANTAC) 150 MG tablet Take 150 mg by mouth daily.   Yes Historical Provider, MD    Family History Family History  Problem Relation Age of Onset  . Lung disease Father     [name]- worked at a [place] History  Substance Use Topics  . Smoking status: Former Smoker    Packs/day: 0.50    Years: 60.00    Types:  Cigars    Quit date: 05/18/2013  . Smokeless tobacco: Current User    Types: Chew  . Alcohol use Yes     Comment: rarely      Allergies   Patient has no known allergies.   Review of Systems Review of Systems  Constitutional: Negative for chills and fever.  HENT: Negative for congestion and sore throat.   Eyes: Negative for visual disturbance.  Respiratory: Negative for cough, shortness of breath and wheezing.   Cardiovascular: Negative for chest pain and palpitations.  Gastrointestinal: Positive for abdominal pain. Negative for abdominal distention, constipation, diarrhea, nausea and vomiting.  Genitourinary: Negative for difficulty urinating, dysuria, penile pain and testicular pain.  Musculoskeletal: Negative for back pain and neck pain.  Skin: Negative for rash.  Neurological: Negative for headaches.     Physical Exam Updated Vital Signs BP 112/73   Pulse (!) 111   Temp  97.6 F (36.4 C) (Oral)   Resp (!) 22   Ht 6' (1.829 m)   Wt 99.8 kg   SpO2 98%   BMI 29.84 kg/m   Physical Exam  Constitutional: He appears well-developed and well-nourished. No distress.  Nontoxic-appearing.  HENT:  Head: Normocephalic and atraumatic.  Mouth/Throat: Oropharynx is clear and moist.  Mucous membranes are moist.  Eyes: Conjunctivae are normal. Pupils are equal, round, and reactive to light. Right eye exhibits no discharge. Left eye exhibits no discharge.  Neck: Neck supple.  Cardiovascular: Normal rate, regular rhythm, normal heart sounds and intact distal pulses.   Pulmonary/Chest: Effort normal and breath sounds normal. No respiratory distress. He has no wheezes. He has no rales.  Wearing nasal cannula oxygen at 2 L/m.  Abdominal: Soft. Bowel sounds are normal. He exhibits no distension and no mass. There is tenderness. There is no rebound and no guarding.  Abdomen soft. Bowel sounds are present. Patient has mild epigastric and left lower quadrant abdominal tenderness to  palpation. No CVA or flank tenderness. No peritoneal signs.  Musculoskeletal: He exhibits no edema.  Lymphadenopathy:    He has no cervical adenopathy.  Neurological: He is alert. Coordination normal.  Skin: Skin is warm and dry. No rash noted. He is not diaphoretic. No erythema. No pallor.  Psychiatric: He has a normal mood and affect. His behavior is normal.  Nursing note and vitals reviewed.    ED Treatments / Results  Labs (all labs ordered are listed, but only abnormal results are displayed) Labs Reviewed  COMPREHENSIVE METABOLIC PANEL - Abnormal; Notable for the following:       Result Value   Potassium 5.2 (*)    Glucose, Bld 108 (*)    Total Protein 6.3 (*)    Albumin 3.1 (*)    ALT 9 (*)    Total Bilirubin 1.3 (*)    GFR calc non Af Amer 54 (*)    All other components within normal limits  CBC WITH DIFFERENTIAL/PLATELET - Abnormal; Notable for the following:    RDW 15.7 (*)    Platelets 129 (*)    All other components within normal limits  URINALYSIS, ROUTINE W REFLEX MICROSCOPIC - Abnormal; Notable for the following:    Hgb urine dipstick LARGE (*)    Protein, ur 30 (*)    Bacteria, UA RARE (*)    Squamous Epithelial / LPF 0-5 (*)    All other components within normal limits  BRAIN NATRIURETIC PEPTIDE - Abnormal; Notable for the following:    B Natriuretic Peptide 107.5 (*)    All other components within normal limits  LACTIC ACID, PLASMA - Abnormal; Notable for the following:    Lactic Acid, Venous 2.4 (*)    All other components within normal limits  LIPASE, BLOOD  I-STAT TROPOININ, ED    EKG  EKG Interpretation None       Radiology Dg Chest Port 1 View  Result Date: 02/05/2017 CLINICAL DATA:  Left lower quadrant pain, onset today.  Dyspnea. EXAM: PORTABLE CHEST 1 VIEW COMPARISON:  09/20/2016, 12/12/2016. FINDINGS: Spiculated left upper lobe mass again evident. Patchy opacity in the lateral left base could represent a infectious infiltrate. This is  new. Emphysematous and fibrotic appearing changes persist. Unchanged mild cardiomegaly. IMPRESSION: Patchy opacity in the lateral left base could represent pneumonia. Hyperinflation and cardiomegaly are unchanged. Persistent left upper lobe spiculated mass. Electronically Signed   By: Andreas Newport M.D.   On: 02/15/2017 23:17  Ct Renal Stone Study  Result Date: 03/03/2017 CLINICAL DATA:  81 y/o M; left lower quadrant abdominal pain. History of stomach ulcer, GERD, duodenum perforation, abdominal aortic aneurysm, colon surgery, cholecystectomy, and lung mass. EXAM: CT ABDOMEN AND PELVIS WITHOUT CONTRAST TECHNIQUE: Multidetector CT imaging of the abdomen and pelvis was performed following the standard protocol without IV contrast. COMPARISON:  09/20/2016 PET-CT.  06/22/2011 CT abdomen and pelvis. FINDINGS: Lower chest: Stable right lower lobe atelectasis and round atelectasis. Calcific atherosclerosis of the aortic valve. AICD lead noted within the right ventricle. Right calcific pleural thickening posteriorly. Hepatobiliary: Ill-defined segment 4B hypoattenuating lesion measuring 16 mm new from prior studies (series 3: Image 24). Pancreas: Unremarkable. No pancreatic ductal dilatation or surrounding inflammatory changes. Spleen: Normal in size without focal abnormality. Adrenals/Urinary Tract: 27 x 25 mm left adrenal mass. Multiple well-circumscribed fluid attenuating foci within the kidneys bilaterally compatible with cysts the largest in the left interpolar region measuring 22 mm. There is an isodense exophytic structure of the left kidney interpolar region measuring 10 mm (series 3, image 36) probably representing a hemorrhagic cyst that is stable. No hydronephrosis. Normal bladder. Stomach/Bowel: Postsurgical changes of proximal duodenum. Patent loop gastrojejunostomy. There are inflammatory changes of fat surrounding loops of wall thickened jejunum in the left upper quadrant extending to the  gastrojejunal junction and there is a small volume of pneumoperitoneum. No evidence for bowel obstruction. Vascular/Lymphatic: Aortic atherosclerosis. No enlarged abdominal or pelvic lymph nodes. 3.3 cm infrarenal abdominal aortic aneurysm. Reproductive: Moderate prostate enlargement. Other: No abdominal wall hernia or abnormality. No abdominopelvic ascites. Musculoskeletal: Advanced cervical spondylosis greatest at the L5-S1 level with there is severe disc space narrowing and lower lumbar facet arthrosis. No acute osseous abnormality identified. Mild bilateral hip osteoarthrosis. IMPRESSION: 1. Inflammatory changes surrounding loops of thickened jejunum near the gastrojejunostomy may be infectious, inflammatory, or related to ischemia. Small volume of pneumoperitoneum indicates perforation. 2. New ill-defined liver segment 4B hypodense lesion measuring 16 mm, possibly metastasis. 3. Right posterior lower lobe calcified pleural plaque and round atelectasis is stable. 4. 3.3 cm stable infrarenal abdominal aortic aneurysm. Recommend followup by ultrasound in 3 years. This recommendation follows ACR consensus guidelines: White Paper of the ACR Incidental Findings Committee II on Vascular Findings. J Am Coll Radiol 2013; 10:789-794. 5. Moderate prostate enlargement. 6. Stable left adrenal adenoma. These results were called by telephone at the time of interpretation on 02/22/2017 at 1:06 am to Dr. Waynetta Pean , who verbally acknowledged these results. Electronically Signed   By: Kristine Garbe M.D.   On: 02/05/2017 01:06    Procedures Procedures (including critical care time)  CRITICAL CARE Performed by: Hanley Hays   Total critical care time: 50 minutes  Critical care time was exclusive of separately billable procedures and treating other patients.  Critical care was necessary to treat or prevent imminent or life-threatening deterioration.  Critical care was time spent personally by  me on the following activities: development of treatment plan with patient and/or surrogate as well as nursing, discussions with consultants, evaluation of patient's response to treatment, examination of patient, obtaining history from patient or surrogate, ordering and performing treatments and interventions, ordering and review of laboratory studies, ordering and review of radiographic studies, pulse oximetry and re-evaluation of patient's condition.   Medications Ordered in ED Medications  iopamidol (ISOVUE-300) 61 % injection (not administered)  fentaNYL (SUBLIMAZE) injection 50 mcg (50 mcg Intravenous Given 02/28/2017 2056)  morphine 4 MG/ML injection 4 mg (4 mg Intravenous Given 02/22/2017 2153)  ondansetron [facility]) injection 4 mg (4 mg Intravenous Given 03/06/2017 2153)  sodium chloride 0.9 % bolus 500 mL (0 mLs Intravenous Stopped 02/10/2017 2357)  LORazepam (ATIVAN) injection 0.5 mg (0.5 mg Intravenous Given 02/06/2017 2251)  LORazepam (ATIVAN) injection 0.5 mg (0.5 mg Intravenous Given 02/27/2017 2339)  piperacillin-tazobactam (ZOSYN) IVPB 3.375 g (0 g Intravenous Stopped 03/02/2017 0134)  morphine 4 MG/ML injection 4 mg (4 mg Intravenous Given 02/25/2017 0119)  vancomycin (VANCOCIN) IVPB 1000 mg/200 mL premix (0 mg Intravenous Stopped 02/10/2017 0235)  morphine 4 MG/ML injection 4 mg (4 mg Intravenous Given 02/27/2017 0402)  HYDROmorphone (DILAUDID) injection 1 mg (1 mg Intravenous Given 03/02/2017 0424)     Initial Impression / Assessment and Plan / ED Course  I have reviewed the triage vital signs and the nursing notes.  Pertinent labs & imaging results that were available during my care of the patient were reviewed by me and considered in my medical decision making (see chart for details).     This is a 81 y.o. Male with a history of CHF, COPD, DVT on Eliquis, CAD, and GERD who presents to the ED complaining of LLQ abdominal pain since earlier this morning. Patient reports sharp and shooting left lower quadrant  abdominal pain since earlier this morning. He reports his pain is resolved after fentanyl by EMS in route to the emergency department. He denies having any nausea, vomiting or diarrhea. He denies other complaints. Last BM was earlier today and normal.  On initial examination patient is afebrile nontoxic appearing. He is resting comfortably in the bed. He reports his abdominal pain has resolved. Soft and he has epigastric and left lower quadrant abdominal tenderness to palpation on exam.  Will obtain blood work and reevaluate.  While awaiting blood results patient's pain returned. He seems to have colicky pain. Repeat abdominal exam reveals epigastric and left lower quadrant tenderness to palpation. Will provide with pain medication and plan for CT renal stone study. Concern for renal stone. Patient denies history of kidney stones.  Patient was brought back from [place] as he was unable to lie still for CT scan. He reports taking now has pain in his lungs and nursing staff reported he had an episode of hypoxia. Concern for possible fluid overload as well. Will obtain portable chest x-ray, troponin and BNP.  Portable chest x-ray shows no evidence of fluid overload. There is a patchy opacity in the lateral left base that could represent pneumonia. His hyperinflation and cardiomegaly are unchanged. He is a persistent left upper lobe spiculated mass. Troponin is not elevated. BNP is only mildly elevated.  At reevaluation patient is lying flat and reports his pain has greatly improved. He denies any trouble breathing at this time. Patient is pain seems to be colicky in nature. Still concern for kidney stone and will again attempt to obtain a CT renal stone study.  CT renal stone study revealed inflammatory changes near his jejunum, a small volume of pneumoperitoneum indicating bowel perforation. Also reveals a hypodense lesion in his liver- suspicious for metastasis from probable lung cancer. Also showed  stable abdominal aortic aneurysm.  Will initiate vancomycin and Zosyn for bowel perforation and consult general surgery.  1:15 am- I spoke with RN in [place] with general surgeon Dr. Hulen Skains. RN relayed information about the patient. Dr. Hulen Skains will be down to see the patient when he is out of the OR.   I dicussed the results with the family and patient. Dr. Tyrone Nine also by to  see patient. On repeat abdominal exam his abdomen is soft with mostly left sided abdominal TTP and has mild distention in comparison to initial exam. No rigidity. Pain medication being administered.   Dr. Hulen Skains down to see patient who ordered CT AP with oral contrast. CT results are pending. Plan for admission by general surgery.   This patient was discussed with and evaluated by Dr. Tyrone Nine who agrees with assessment and plan.   Final Clinical Impressions(s) / ED Diagnoses   Final diagnoses:  Pneumoperitoneum  Liver lesion  Left lower quadrant pain  Abdominal aortic aneurysm (AAA) 3.0 cm to 5.5 cm in diameter in male [facility])  Bowel perforation [facility])    New Prescriptions New Prescriptions   No medications on file     Waynetta Pean, PA-C 02/10/2017 [place], DO 02/09/2017 2080

## 2017-02-11 NOTE — ED Notes (Signed)
Pt. Requested pain medication for LLQ pain, PA notified.

## 2017-02-12 ENCOUNTER — Inpatient Hospital Stay (HOSPITAL_COMMUNITY): Payer: Medicare Other | Admitting: Certified Registered Nurse Anesthetist

## 2017-02-12 ENCOUNTER — Emergency Department (HOSPITAL_COMMUNITY): Payer: Medicare Other

## 2017-02-12 ENCOUNTER — Inpatient Hospital Stay (HOSPITAL_COMMUNITY): Payer: Medicare Other

## 2017-02-12 ENCOUNTER — Encounter (HOSPITAL_COMMUNITY): Admission: EM | Disposition: E | Payer: Self-pay | Source: Home / Self Care | Attending: Pulmonary Disease

## 2017-02-12 DIAGNOSIS — K285 Chronic or unspecified gastrojejunal ulcer with perforation: Secondary | ICD-10-CM | POA: Diagnosis present

## 2017-02-12 DIAGNOSIS — E872 Acidosis, unspecified: Secondary | ICD-10-CM | POA: Diagnosis present

## 2017-02-12 DIAGNOSIS — J189 Pneumonia, unspecified organism: Secondary | ICD-10-CM | POA: Diagnosis not present

## 2017-02-12 DIAGNOSIS — J9601 Acute respiratory failure with hypoxia: Secondary | ICD-10-CM | POA: Diagnosis not present

## 2017-02-12 DIAGNOSIS — I255 Ischemic cardiomyopathy: Secondary | ICD-10-CM | POA: Diagnosis not present

## 2017-02-12 DIAGNOSIS — Z95810 Presence of automatic (implantable) cardiac defibrillator: Secondary | ICD-10-CM | POA: Diagnosis not present

## 2017-02-12 DIAGNOSIS — I110 Hypertensive heart disease with heart failure: Secondary | ICD-10-CM | POA: Diagnosis present

## 2017-02-12 DIAGNOSIS — K567 Ileus, unspecified: Secondary | ICD-10-CM | POA: Diagnosis not present

## 2017-02-12 DIAGNOSIS — R0603 Acute respiratory distress: Secondary | ICD-10-CM | POA: Diagnosis not present

## 2017-02-12 DIAGNOSIS — J181 Lobar pneumonia, unspecified organism: Secondary | ICD-10-CM | POA: Diagnosis present

## 2017-02-12 DIAGNOSIS — I5022 Chronic systolic (congestive) heart failure: Secondary | ICD-10-CM

## 2017-02-12 DIAGNOSIS — E870 Hyperosmolality and hypernatremia: Secondary | ICD-10-CM | POA: Diagnosis not present

## 2017-02-12 DIAGNOSIS — A419 Sepsis, unspecified organism: Secondary | ICD-10-CM | POA: Diagnosis present

## 2017-02-12 DIAGNOSIS — Z515 Encounter for palliative care: Secondary | ICD-10-CM | POA: Diagnosis not present

## 2017-02-12 DIAGNOSIS — J440 Chronic obstructive pulmonary disease with acute lower respiratory infection: Secondary | ICD-10-CM | POA: Diagnosis present

## 2017-02-12 DIAGNOSIS — K631 Perforation of intestine (nontraumatic): Secondary | ICD-10-CM | POA: Diagnosis not present

## 2017-02-12 DIAGNOSIS — I471 Supraventricular tachycardia: Secondary | ICD-10-CM | POA: Diagnosis present

## 2017-02-12 DIAGNOSIS — K9189 Other postprocedural complications and disorders of digestive system: Secondary | ICD-10-CM | POA: Diagnosis not present

## 2017-02-12 DIAGNOSIS — J95821 Acute postprocedural respiratory failure: Secondary | ICD-10-CM | POA: Diagnosis not present

## 2017-02-12 DIAGNOSIS — K352 Acute appendicitis with generalized peritonitis: Secondary | ICD-10-CM | POA: Diagnosis not present

## 2017-02-12 DIAGNOSIS — I452 Bifascicular block: Secondary | ICD-10-CM | POA: Diagnosis present

## 2017-02-12 DIAGNOSIS — R579 Shock, unspecified: Secondary | ICD-10-CM | POA: Diagnosis not present

## 2017-02-12 DIAGNOSIS — J439 Emphysema, unspecified: Secondary | ICD-10-CM

## 2017-02-12 DIAGNOSIS — I10 Essential (primary) hypertension: Secondary | ICD-10-CM | POA: Diagnosis not present

## 2017-02-12 DIAGNOSIS — Z66 Do not resuscitate: Secondary | ICD-10-CM | POA: Diagnosis not present

## 2017-02-12 DIAGNOSIS — I2510 Atherosclerotic heart disease of native coronary artery without angina pectoris: Secondary | ICD-10-CM | POA: Diagnosis not present

## 2017-02-12 DIAGNOSIS — Z86711 Personal history of pulmonary embolism: Secondary | ICD-10-CM | POA: Diagnosis not present

## 2017-02-12 DIAGNOSIS — K668 Other specified disorders of peritoneum: Secondary | ICD-10-CM | POA: Diagnosis present

## 2017-02-12 DIAGNOSIS — J9620 Acute and chronic respiratory failure, unspecified whether with hypoxia or hypercapnia: Secondary | ICD-10-CM | POA: Diagnosis not present

## 2017-02-12 DIAGNOSIS — C3490 Malignant neoplasm of unspecified part of unspecified bronchus or lung: Secondary | ICD-10-CM | POA: Diagnosis present

## 2017-02-12 DIAGNOSIS — R0600 Dyspnea, unspecified: Secondary | ICD-10-CM | POA: Diagnosis not present

## 2017-02-12 DIAGNOSIS — I480 Paroxysmal atrial fibrillation: Secondary | ICD-10-CM | POA: Diagnosis not present

## 2017-02-12 DIAGNOSIS — I472 Ventricular tachycardia: Secondary | ICD-10-CM | POA: Diagnosis not present

## 2017-02-12 DIAGNOSIS — J9621 Acute and chronic respiratory failure with hypoxia: Secondary | ICD-10-CM | POA: Diagnosis not present

## 2017-02-12 DIAGNOSIS — I714 Abdominal aortic aneurysm, without rupture: Secondary | ICD-10-CM | POA: Diagnosis not present

## 2017-02-12 DIAGNOSIS — Z9981 Dependence on supplemental oxygen: Secondary | ICD-10-CM | POA: Diagnosis not present

## 2017-02-12 DIAGNOSIS — R6521 Severe sepsis with septic shock: Secondary | ICD-10-CM | POA: Diagnosis present

## 2017-02-12 DIAGNOSIS — N179 Acute kidney failure, unspecified: Secondary | ICD-10-CM | POA: Diagnosis not present

## 2017-02-12 DIAGNOSIS — I252 Old myocardial infarction: Secondary | ICD-10-CM | POA: Diagnosis not present

## 2017-02-12 DIAGNOSIS — K659 Peritonitis, unspecified: Secondary | ICD-10-CM | POA: Diagnosis present

## 2017-02-12 HISTORY — PX: REPAIR OF PERFORATED ULCER: SHX6065

## 2017-02-12 LAB — POCT I-STAT 7, (LYTES, BLD GAS, ICA,H+H)
ACID-BASE DEFICIT: 6 mmol/L — AB (ref 0.0–2.0)
Bicarbonate: 22.1 mmol/L (ref 20.0–28.0)
CALCIUM ION: 1.17 mmol/L (ref 1.15–1.40)
HEMATOCRIT: 50 % (ref 39.0–52.0)
HEMOGLOBIN: 17 g/dL (ref 13.0–17.0)
O2 SAT: 97 %
PCO2 ART: 48.2 mmHg — AB (ref 32.0–48.0)
Patient temperature: 35.8
Potassium: 5.4 mmol/L — ABNORMAL HIGH (ref 3.5–5.1)
SODIUM: 139 mmol/L (ref 135–145)
TCO2: 24 mmol/L (ref 0–100)
pH, Arterial: 7.263 — ABNORMAL LOW (ref 7.350–7.450)
pO2, Arterial: 104 mmHg (ref 83.0–108.0)

## 2017-02-12 LAB — GLUCOSE, CAPILLARY: Glucose-Capillary: 105 mg/dL — ABNORMAL HIGH (ref 65–99)

## 2017-02-12 LAB — LACTIC ACID, PLASMA
LACTIC ACID, VENOUS: 2.4 mmol/L — AB (ref 0.5–1.9)
Lactic Acid, Venous: 2.5 mmol/L (ref 0.5–1.9)

## 2017-02-12 LAB — MRSA PCR SCREENING: MRSA BY PCR: NEGATIVE

## 2017-02-12 SURGERY — REPAIR, ULCER, PEPTIC, PERFORATED
Anesthesia: General | Site: Abdomen

## 2017-02-12 MED ORDER — VANCOMYCIN HCL IN DEXTROSE 1-5 GM/200ML-% IV SOLN
1000.0000 mg | Freq: Once | INTRAVENOUS | Status: AC
  Administered 2017-02-12: 1000 mg via INTRAVENOUS
  Filled 2017-02-12: qty 200

## 2017-02-12 MED ORDER — LIDOCAINE HCL (CARDIAC) 20 MG/ML IV SOLN
INTRAVENOUS | Status: DC | PRN
  Administered 2017-02-12: 100 mg via INTRAVENOUS

## 2017-02-12 MED ORDER — SUCCINYLCHOLINE CHLORIDE 200 MG/10ML IV SOSY
PREFILLED_SYRINGE | INTRAVENOUS | Status: AC
  Filled 2017-02-12: qty 10

## 2017-02-12 MED ORDER — SODIUM CHLORIDE 0.9 % IV BOLUS (SEPSIS)
1000.0000 mL | Freq: Once | INTRAVENOUS | Status: AC
Start: 2017-02-12 — End: 2017-02-12
  Administered 2017-02-12: 1000 mL via INTRAVENOUS

## 2017-02-12 MED ORDER — MORPHINE SULFATE (PF) 4 MG/ML IV SOLN
4.0000 mg | Freq: Once | INTRAVENOUS | Status: AC
  Administered 2017-02-12: 4 mg via INTRAVENOUS
  Filled 2017-02-12: qty 1

## 2017-02-12 MED ORDER — FENTANYL CITRATE (PF) 100 MCG/2ML IJ SOLN
100.0000 ug | Freq: Once | INTRAMUSCULAR | Status: AC
  Administered 2017-02-12: 100 ug via INTRAVENOUS

## 2017-02-12 MED ORDER — FENTANYL CITRATE (PF) 100 MCG/2ML IJ SOLN
50.0000 ug | INTRAMUSCULAR | Status: DC | PRN
  Administered 2017-02-13: 50 ug via INTRAVENOUS
  Filled 2017-02-12: qty 2

## 2017-02-12 MED ORDER — 0.9 % SODIUM CHLORIDE (POUR BTL) OPTIME
TOPICAL | Status: DC | PRN
  Administered 2017-02-12: 2000 mL

## 2017-02-12 MED ORDER — PROMETHAZINE HCL 25 MG/ML IJ SOLN: 6.2500 mg | INTRAMUSCULAR | Status: DC | PRN

## 2017-02-12 MED ORDER — CHLORHEXIDINE GLUCONATE 0.12% ORAL RINSE (MEDLINE KIT)
15.0000 mL | Freq: Two times a day (BID) | OROMUCOSAL | Status: DC
Start: 2017-02-12 — End: 2017-02-16
  Administered 2017-02-12 – 2017-02-16 (×8): 15 mL via OROMUCOSAL

## 2017-02-12 MED ORDER — ROCURONIUM BROMIDE 50 MG/5ML IV SOSY
PREFILLED_SYRINGE | INTRAVENOUS | Status: AC
  Filled 2017-02-12: qty 5

## 2017-02-12 MED ORDER — HYDROMORPHONE HCL 1 MG/ML IJ SOLN
1.0000 mg | Freq: Once | INTRAMUSCULAR | Status: AC
  Administered 2017-02-12: 1 mg via INTRAVENOUS
  Filled 2017-02-12: qty 1

## 2017-02-12 MED ORDER — FAMOTIDINE 10 MG PO TABS
10.0000 mg | ORAL_TABLET | Freq: Every day | ORAL | Status: DC
  Filled 2017-02-12: qty 1

## 2017-02-12 MED ORDER — ORAL CARE MOUTH RINSE
15.0000 mL | Freq: Four times a day (QID) | OROMUCOSAL | Status: DC
  Administered 2017-02-13 – 2017-02-16 (×14): 15 mL via OROMUCOSAL

## 2017-02-12 MED ORDER — ETOMIDATE 2 MG/ML IV SOLN
INTRAVENOUS | Status: AC
  Filled 2017-02-12: qty 10

## 2017-02-12 MED ORDER — PHENYLEPHRINE HCL 10 MG/ML IJ SOLN
INTRAVENOUS | Status: DC | PRN
  Administered 2017-02-12: 25 ug/min via INTRAVENOUS

## 2017-02-12 MED ORDER — HYDROMORPHONE HCL 1 MG/ML IJ SOLN: 1.0000 mg | INTRAMUSCULAR | Status: DC | PRN

## 2017-02-12 MED ORDER — HYDROMORPHONE HCL 1 MG/ML IJ SOLN
1.0000 mg | INTRAMUSCULAR | Status: DC | PRN
  Administered 2017-02-12 – 2017-02-18 (×11): 1 mg via INTRAVENOUS
  Filled 2017-02-12 (×13): qty 1

## 2017-02-12 MED ORDER — FLUCONAZOLE IN SODIUM CHLORIDE 100-0.9 MG/50ML-% IV SOLN: 100.0000 mg | INTRAVENOUS | Status: DC

## 2017-02-12 MED ORDER — SODIUM CHLORIDE 0.9 % IV BOLUS (SEPSIS)
500.0000 mL | Freq: Once | INTRAVENOUS | Status: AC
  Administered 2017-02-12: 500 mL via INTRAVENOUS

## 2017-02-12 MED ORDER — ALBUMIN HUMAN 5 % IV SOLN
INTRAVENOUS | Status: DC | PRN
  Administered 2017-02-12 (×2): via INTRAVENOUS

## 2017-02-12 MED ORDER — IPRATROPIUM-ALBUTEROL 0.5-2.5 (3) MG/3ML IN SOLN
3.0000 mL | Freq: Four times a day (QID) | RESPIRATORY_TRACT | Status: DC
  Administered 2017-02-12 – 2017-02-24 (×46): 3 mL via RESPIRATORY_TRACT
  Filled 2017-02-12 (×49): qty 3

## 2017-02-12 MED ORDER — IOPAMIDOL (ISOVUE-300) INJECTION 61%
INTRAVENOUS | Status: AC
  Filled 2017-02-12: qty 50

## 2017-02-12 MED ORDER — LACTATED RINGERS IV SOLN
INTRAVENOUS | Status: DC | PRN
  Administered 2017-02-12: 14:00:00 via INTRAVENOUS

## 2017-02-12 MED ORDER — DEXTROSE-NACL 5-0.9 % IV SOLN
INTRAVENOUS | Status: DC
  Administered 2017-02-12 – 2017-02-14 (×5): via INTRAVENOUS

## 2017-02-12 MED ORDER — DEXMEDETOMIDINE HCL IN NACL 400 MCG/100ML IV SOLN
0.4000 ug/kg/h | INTRAVENOUS | Status: DC
  Administered 2017-02-12: 1.002 ug/kg/h via INTRAVENOUS
  Filled 2017-02-12 (×2): qty 100

## 2017-02-12 MED ORDER — ETOMIDATE 2 MG/ML IV SOLN
INTRAVENOUS | Status: DC | PRN
  Administered 2017-02-12: 12 mg via INTRAVENOUS

## 2017-02-12 MED ORDER — FUROSEMIDE 20 MG PO TABS
20.0000 mg | ORAL_TABLET | Freq: Every day | ORAL | Status: DC
  Filled 2017-02-12: qty 1

## 2017-02-12 MED ORDER — SODIUM CHLORIDE 0.9% FLUSH
3.0000 mL | Freq: Two times a day (BID) | INTRAVENOUS | Status: DC
  Administered 2017-02-12 – 2017-02-23 (×14): 3 mL via INTRAVENOUS

## 2017-02-12 MED ORDER — IPRATROPIUM-ALBUTEROL 0.5-2.5 (3) MG/3ML IN SOLN
RESPIRATORY_TRACT | Status: AC
  Filled 2017-02-12: qty 3

## 2017-02-12 MED ORDER — ACETAMINOPHEN 650 MG RE SUPP: 650.0000 mg | Freq: Four times a day (QID) | RECTAL | Status: DC | PRN

## 2017-02-12 MED ORDER — ROCURONIUM BROMIDE 100 MG/10ML IV SOLN
INTRAVENOUS | Status: DC | PRN
  Administered 2017-02-12 (×2): 50 mg via INTRAVENOUS

## 2017-02-12 MED ORDER — MEPERIDINE HCL 25 MG/ML IJ SOLN: 6.2500 mg | INTRAMUSCULAR | Status: DC | PRN

## 2017-02-12 MED ORDER — SODIUM CHLORIDE 0.9 % IV BOLUS (SEPSIS)
1000.0000 mL | Freq: Once | INTRAVENOUS | Status: AC
  Administered 2017-02-12: 1000 mL via INTRAVENOUS

## 2017-02-12 MED ORDER — VANCOMYCIN HCL IN DEXTROSE 750-5 MG/150ML-% IV SOLN
750.0000 mg | Freq: Two times a day (BID) | INTRAVENOUS | Status: DC
  Administered 2017-02-13: 750 mg via INTRAVENOUS
  Filled 2017-02-12 (×3): qty 150

## 2017-02-12 MED ORDER — LACTATED RINGERS IV SOLN
INTRAVENOUS | Status: DC | PRN
  Administered 2017-02-12 (×2): via INTRAVENOUS

## 2017-02-12 MED ORDER — FENTANYL CITRATE (PF) 250 MCG/5ML IJ SOLN
INTRAMUSCULAR | Status: AC
  Filled 2017-02-12: qty 5

## 2017-02-12 MED ORDER — ACETAMINOPHEN 325 MG PO TABS: 650.0000 mg | ORAL_TABLET | Freq: Four times a day (QID) | ORAL | Status: DC | PRN

## 2017-02-12 MED ORDER — SODIUM BICARBONATE 8.4 % IV SOLN
INTRAVENOUS | Status: DC | PRN
  Administered 2017-02-12: 50 meq via INTRAVENOUS

## 2017-02-12 MED ORDER — PANTOPRAZOLE SODIUM 40 MG IV SOLR
40.0000 mg | Freq: Two times a day (BID) | INTRAVENOUS | Status: DC
  Administered 2017-02-12 – 2017-02-13 (×3): 40 mg via INTRAVENOUS
  Filled 2017-02-12 (×4): qty 40

## 2017-02-12 MED ORDER — ARFORMOTEROL TARTRATE 15 MCG/2ML IN NEBU
15.0000 ug | INHALATION_SOLUTION | Freq: Two times a day (BID) | RESPIRATORY_TRACT | Status: DC
  Administered 2017-02-12 – 2017-02-24 (×24): 15 ug via RESPIRATORY_TRACT
  Filled 2017-02-12 (×25): qty 2

## 2017-02-12 MED ORDER — ALBUTEROL SULFATE (2.5 MG/3ML) 0.083% IN NEBU
2.5000 mg | INHALATION_SOLUTION | RESPIRATORY_TRACT | Status: DC | PRN
  Administered 2017-02-17: 2.5 mg via RESPIRATORY_TRACT
  Filled 2017-02-12: qty 3

## 2017-02-12 MED ORDER — FLUCONAZOLE IN SODIUM CHLORIDE 400-0.9 MG/200ML-% IV SOLN
400.0000 mg | INTRAVENOUS | Status: DC
  Administered 2017-02-12: 400 mg via INTRAVENOUS
  Filled 2017-02-12 (×2): qty 200

## 2017-02-12 MED ORDER — SUCCINYLCHOLINE CHLORIDE 20 MG/ML IJ SOLN
INTRAMUSCULAR | Status: DC | PRN
  Administered 2017-02-12: 140 mg via INTRAVENOUS

## 2017-02-12 MED ORDER — PIPERACILLIN-TAZOBACTAM 3.375 G IVPB
3.3750 g | Freq: Three times a day (TID) | INTRAVENOUS | Status: DC
  Administered 2017-02-12 – 2017-02-17 (×14): 3.375 g via INTRAVENOUS
  Filled 2017-02-12 (×16): qty 50

## 2017-02-12 MED ORDER — PIPERACILLIN-TAZOBACTAM 3.375 G IVPB 30 MIN
3.3750 g | Freq: Once | INTRAVENOUS | Status: AC
  Administered 2017-02-12: 3.375 g via INTRAVENOUS
  Filled 2017-02-12: qty 50

## 2017-02-12 MED ORDER — HYDROMORPHONE HCL 1 MG/ML IJ SOLN
0.2500 mg | INTRAMUSCULAR | Status: DC | PRN
  Administered 2017-02-12: 0.5 mg via INTRAVENOUS

## 2017-02-12 MED ORDER — BUDESONIDE 0.5 MG/2ML IN SUSP
0.5000 mg | Freq: Two times a day (BID) | RESPIRATORY_TRACT | Status: DC
  Administered 2017-02-12 – 2017-02-24 (×24): 0.5 mg via RESPIRATORY_TRACT
  Filled 2017-02-12 (×25): qty 2

## 2017-02-12 MED ORDER — FENTANYL CITRATE (PF) 100 MCG/2ML IJ SOLN
INTRAMUSCULAR | Status: DC | PRN
  Administered 2017-02-12: 150 ug via INTRAVENOUS
  Administered 2017-02-12 (×2): 50 ug via INTRAVENOUS

## 2017-02-12 SURGICAL SUPPLY — 45 items
BLADE CLIPPER SURG (BLADE) IMPLANT
BNDG GAUZE ELAST 4 BULKY (GAUZE/BANDAGES/DRESSINGS) ×3 IMPLANT
CANISTER SUCT 3000ML PPV (MISCELLANEOUS) ×3 IMPLANT
CHLORAPREP W/TINT 26ML (MISCELLANEOUS) ×3 IMPLANT
COVER SURGICAL LIGHT HANDLE (MISCELLANEOUS) ×3 IMPLANT
DRAIN CHANNEL 19F RND (DRAIN) ×6 IMPLANT
DRAPE LAPAROSCOPIC ABDOMINAL (DRAPES) ×3 IMPLANT
DRAPE WARM FLUID 44X44 (DRAPE) ×3 IMPLANT
DRSG OPSITE POSTOP 4X10 (GAUZE/BANDAGES/DRESSINGS) IMPLANT
DRSG OPSITE POSTOP 4X8 (GAUZE/BANDAGES/DRESSINGS) IMPLANT
ELECT BLADE 6.5 EXT (BLADE) IMPLANT
ELECT CAUTERY BLADE 6.4 (BLADE) ×3 IMPLANT
ELECT REM PT RETURN 9FT ADLT (ELECTROSURGICAL) ×3
ELECTRODE REM PT RTRN 9FT ADLT (ELECTROSURGICAL) ×1 IMPLANT
EVACUATOR SILICONE 100CC (DRAIN) ×6 IMPLANT
GAUZE SPONGE 4X4 12PLY STRL LF (GAUZE/BANDAGES/DRESSINGS) ×3 IMPLANT
GLOVE BIO SURGEON STRL SZ8 (GLOVE) ×3 IMPLANT
GLOVE BIOGEL PI IND STRL 8 (GLOVE) ×1 IMPLANT
GLOVE BIOGEL PI INDICATOR 8 (GLOVE) ×2
GOWN STRL REUS W/ TWL LRG LVL3 (GOWN DISPOSABLE) ×1 IMPLANT
GOWN STRL REUS W/ TWL XL LVL3 (GOWN DISPOSABLE) ×1 IMPLANT
GOWN STRL REUS W/TWL LRG LVL3 (GOWN DISPOSABLE) ×2
GOWN STRL REUS W/TWL XL LVL3 (GOWN DISPOSABLE) ×2
KIT BASIN OR (CUSTOM PROCEDURE TRAY) ×3 IMPLANT
KIT ROOM TURNOVER OR (KITS) ×3 IMPLANT
LIGASURE IMPACT 36 18CM CVD LR (INSTRUMENTS) IMPLANT
NS IRRIG 1000ML POUR BTL (IV SOLUTION) ×6 IMPLANT
PACK GENERAL/GYN (CUSTOM PROCEDURE TRAY) ×3 IMPLANT
PAD ABD 8X10 STRL (GAUZE/BANDAGES/DRESSINGS) ×3 IMPLANT
PAD ARMBOARD 7.5X6 YLW CONV (MISCELLANEOUS) ×3 IMPLANT
SPECIMEN JAR LARGE (MISCELLANEOUS) IMPLANT
SPONGE LAP 18X18 X RAY DECT (DISPOSABLE) IMPLANT
STAPLER VISISTAT 35W (STAPLE) ×3 IMPLANT
SUCTION POOLE TIP (SUCTIONS) ×3 IMPLANT
SUT ETHILON 2 0 FS 18 (SUTURE) ×6 IMPLANT
SUT PDS AB 1 TP1 96 (SUTURE) ×6 IMPLANT
SUT VIC AB 2-0 SH 18 (SUTURE) ×3 IMPLANT
SUT VIC AB 3-0 SH 18 (SUTURE) ×3 IMPLANT
SUT VICRYL AB 2 0 TIES (SUTURE) IMPLANT
SUT VICRYL AB 3 0 TIES (SUTURE) IMPLANT
TAPE CLOTH SURG 6X10 WHT LF (GAUZE/BANDAGES/DRESSINGS) ×3 IMPLANT
TOWEL OR 17X24 6PK STRL BLUE (TOWEL DISPOSABLE) ×3 IMPLANT
TOWEL OR 17X26 10 PK STRL BLUE (TOWEL DISPOSABLE) ×3 IMPLANT
TRAY FOLEY W/METER SILVER 16FR (SET/KITS/TRAYS/PACK) IMPLANT
YANKAUER SUCT BULB TIP NO VENT (SUCTIONS) IMPLANT

## 2017-02-12 NOTE — Progress Notes (Signed)
Dr. Hetty Ely made aware of patients blood pressure sustaining in the 81M systolic. Precedex currently shut off, increase in agitation. Blood pressure rises with agitation but immediatly drops to 70s once precedex is restarted.  1000cc bolus ordered. Will continue to monitor.

## 2017-02-12 NOTE — ED Provider Notes (Signed)
6:27 AM BP 112/73   Pulse (!) 111   Temp 97.6 F (36.4 C) (Oral)   Resp (!) 22   Ht 6' (1.829 m)   Wt 99.8 kg   SpO2 98%   BMI 29.84 kg/m  Patient taken in sign out from [place].  The patient is here with Perf gastric Ulcer/ pneumoperitoneum Patient on eliquis. Surgery asks for a medicine admit.  Pending [facility] Day team approval.   Patient accepted for admission. Pt stable in ED with no significant deterioration in condition.    Margarita Mail, PA-C 02/22/17 [place], DO 02/22/17 [phone]

## 2017-02-12 NOTE — ED Notes (Signed)
Spoke with OR gave report and transporting to room 36 short stay.

## 2017-02-12 NOTE — ED Notes (Signed)
Family at bedside. Pt resting 

## 2017-02-12 NOTE — H&P (Signed)
History and Physical    Jesus Sanders FGH:[id number] DOB: April 19, 1936 DOA: 03/04/2017  Referring Deno Etienne, Judeth Horn  PCP: Claris Gower Outpatient Specialists:  Patient coming from: home  Chief Complaint: Abdominal pain   HPI: Jesus Sanders is a 81 y.o. male with medical history significant of CAD, AAA,COPD,DVT, Htn, MI, Polycythemia vera, Pulmonary embolism, CHF with chronic shortness of breath. He began having severe abdominal pain Saturday afternoon 4/7 and asked his family to bring him to the hospital.  Pt was seen in the Emergency department and diagnosed with a perforated ulcer and possible pneumonia.  He was seen and evaluated by Dr. Hulen Skains who plans to take pt to the operating room for exploration today.  Pt had a chest xray which shows possible pneumonia.    ED Course: Pt had ct scan which shows likely jejunum perforation with Pneumomediastinum, chest xray shows possible pneumonia.   Review of Systems: As per HPI otherwise 10 point review of systems negative.     Past Medical History:  Diagnosis Date  . AAA (abdominal aortic aneurysm) ([place])    a. CT 07/2015 - mild aneurysmal dilatation of the distal abdominal aorta measuring 3.2 x 3.4 cm.  Marland Kitchen CAD (coronary artery disease)    a. 07/2011 Anterior apical STEMI/Cath/PCI: LM nl, LAD 100p/m (2.5 x 62m Mini-Vision BMS), LCX 40p, RCA dominant, nl, EF 30%.  . Chronic bronchitis   . Chronic respiratory failure ([place]   . COPD (chronic obstructive pulmonary disease) ([place]   . Dilatation of thoracic aorta ([place]    a. CT 07/2015 - aneurysmal dilatation of the descending thoracic aorta measuring 3.9 cm in greatest diameter.  . Duodenal perforation ([facility] June 2012  . GERD (gastroesophageal reflux disease)   . History of DVT (deep vein thrombosis)   . Hypertension   . Ischemic cardiomyopathy    a. 01/2012 S/P MDT Protecta single lead ICD, ser # [id number]  . Myocardial infarction   . Peritonitis ([facility] June 2012  . Pneumonia April 2012    . Polycythemia vera(238.4)    a. Used to receive chronic phlebotomies [facility]  will restart his phlebotomies from about Mar 15 2011  . Popliteal aneurysm ([place]   . Pulmonary embolism ([place]    a. Diagnosed 07/2015 - placed on Eliquis.  . Shortness of breath   . Stomach ulcer   . Systolic CHF, chronic ([place]    a. 12/2011 Echo: EF 25-30%, mid-dist antsept/inf, apical AK, Gr 1 DD, Triv AI, Mild MR.  . Tobacco abuse    a. cigars    Past Surgical History:  Procedure Laterality Date  . CARDIAC CATHETERIZATION  Sept 2012   Normal left main, occluded LAD, 40% LCX and normal RCA. EF is 30%  . CARDIAC DEFIBRILLATOR PLACEMENT     single chamber  . CHOLECYSTECTOMY  03/2011   Dr. TMarlou Starks . COLON SURGERY    . IMPLANTABLE CARDIOVERTER DEFIBRILLATOR IMPLANT N/A 01/11/2012   Procedure: IMPLANTABLE CARDIOVERTER DEFIBRILLATOR IMPLANT;  Surgeon: SDeboraha Sprang MD;  [facility];  Service: Cardiovascular;  Laterality: N/A; [employer]  . PERIPHERAL VASCULAR CATHETERIZATION N/A 08/05/2015   Procedure: Lower Extremity Angiography;  Surgeon: VSerafina Mitchell MD;  [place];  Service: Cardiovascular;  Laterality: N/A;  . SHOULDER ARTHROSCOPY     left, rotatotor cuff tendinopathy  . [place]  Sept 2012   EF 25 to 30% with akinesis of the mid to distal anterior and apical myocardium,  trivial AI and no apical thrombus     reports that he quit smoking about 3 years ago. His smoking use included Cigars. He has a 30.00 pack-year smoking history. His smokeless tobacco use includes Chew. He reports that he drinks alcohol. He reports that he does not use drugs.  No Known Allergies  Family History  Problem Relation Age of Onset  . Lung disease Father     [name]- worked at a [employer]      Prior to Admission medications   Medication Sig Start Date End Date Taking? Authorizing Provider  aspirin EC 81 MG EC tablet Take 1 tablet (81 mg total)  by mouth daily. 09/02/16  Yes Eloise Levels, MD  BROVANA 15 MCG/2ML NEBU USE ONE VIAL IN NEBULIZER TWICE DAILY 09/20/16  Yes Tanda Rockers, MD  budesonide (PULMICORT) 0.25 MG/2ML nebulizer solution USE ONE VIAL IN NEBULIZER TWICE DAILY 08/04/15  Yes Tanda Rockers, MD  furosemide (LASIX) 40 MG tablet Take 20 mg by mouth daily.    Yes Historical Provider, MD  lovastatin (MEVACOR) 20 MG tablet Take 20 mg by mouth at bedtime.   Yes Historical Provider, MD  metoprolol succinate (TOPROL-XL) 100 MG 24 hr tablet Take 1 tablet (100 mg total) by mouth 2 (two) times daily. 12/12/16  Yes Deboraha Sprang, MD  ranitidine (ZANTAC) 150 MG tablet Take 150 mg by mouth daily.   Yes Historical Provider, MD    Physical Exam: Vitals:   02/05/2017 0530 02/07/2017 0645 02/10/2017 0700 02/15/2017 0715  BP: 112/73 122/89 (!) 117/98 127/88  Pulse: (!) 111 (!) 109 (!) 109 (!) 107  Resp: (!) 22 19 (!) 21 20  Temp:      TempSrc:      SpO2: 98% 95% 97% 98%  Weight:      Height:          Constitutional: NAD, calm, comfortable Vitals:   03/05/2017 0530 02/17/2017 0645 02/23/2017 0700 02/13/2017 0715  BP: 112/73 122/89 (!) 117/98 127/88  Pulse: (!) 111 (!) 109 (!) 109 (!) 107  Resp: (!) 22 19 (!) 21 20  Temp:      TempSrc:      SpO2: 98% 95% 97% 98%  Weight:      Height:       Eyes: PERRL, lids and conjunctivae normal ENMT: Mucous membranes are moist. Posterior pharynx clear of any exudate or lesions  Neck: normal, supple, no masses, no thyromegaly Respiratory: rhonchi no wheezing Cardiovascular: Regular rate and rhythm, no murmurs / rubs / gallops. No extremity edema. 2+ pedal pulses. No carotid bruits.  Abdomen:  tenderness, no masses palpated. No hepatosplenomegaly.decreased bowel sounds Musculoskeletal: no clubbing / cyanosis. No joint deformity upper and lower extremities. Good ROM, no contractures. Normal muscle tone.  Skin: no rashes, lesions, ulcers. No induration Neurologic: CN 2-12 grossly intact. Sensation  intact, DTR normal.moves all extremities strength not tested due to sedation Psychiatric: Pt sedated      Labs on Admission: I have personally reviewed following labs and imaging studies  CBC:  Recent Labs Lab 03/04/2017 2030  WBC 7.5  NEUTROABS 5.8  HGB 16.1  HCT 51.0  MCV 88.1  PLT 664*   Basic Metabolic Panel:  Recent Labs Lab 02/07/2017 2030  NA 140  K 5.2*  CL 104  CO2 31  GLUCOSE 108*  BUN 20  CREATININE 1.24  CALCIUM 9.0   GFR: Estimated Creatinine Clearance: 60.1 mL/min (by C-G formula based on SCr of 1.24  mg/dL). Liver Function Tests:  Recent Labs Lab 02/07/2017 2030  AST 22  ALT 9*  ALKPHOS 83  BILITOT 1.3*  PROT 6.3*  ALBUMIN 3.1*    Recent Labs Lab 02/17/2017 2030  LIPASE 16   No results for input(s): AMMONIA in the last 168 hours. Coagulation Profile: No results for input(s): INR, PROTIME in the last 168 hours. Cardiac Enzymes: No results for input(s): CKTOTAL, CKMB, CKMBINDEX, TROPONINI in the last 168 hours. BNP (last 3 results) No results for input(s): PROBNP in the last 8760 hours. HbA1C: No results for input(s): HGBA1C in the last 72 hours. CBG: No results for input(s): GLUCAP in the last 168 hours. Lipid Profile: No results for input(s): CHOL, HDL, LDLCALC, TRIG, CHOLHDL, LDLDIRECT in the last 72 hours. Thyroid Function Tests: No results for input(s): TSH, T4TOTAL, FREET4, T3FREE, THYROIDAB in the last 72 hours. Anemia Panel: No results for input(s): VITAMINB12, FOLATE, FERRITIN, TIBC, IRON, RETICCTPCT in the last 72 hours. Urine analysis:    Component Value Date/Time   COLORURINE YELLOW 02/25/2017 2048   APPEARANCEUR CLEAR 02/09/2017 2048   LABSPEC 1.021 03/06/2017 2048   PHURINE 5.0 02/16/2017 2048   GLUCOSEU NEGATIVE 03/01/2017 2048   HGBUR LARGE (A) 02/17/2017 2048   BILIRUBINUR NEGATIVE 02/27/2017 2048   KETONESUR NEGATIVE 02/19/2017 2048   PROTEINUR 30 (A) 02/13/2017 2048   UROBILINOGEN 0.2 10/12/2012 2152   NITRITE  NEGATIVE 02/28/2017 2048   LEUKOCYTESUR NEGATIVE 02/14/2017 2048   Sepsis Labs: '@LABRCNTIP'$ (procalcitonin:4,lacticidven:4)   Radiological Exams on Admission: Ct Abdomen Pelvis Wo Contrast  Result Date: 02/09/2017 CLINICAL DATA:  Pneumoperitoneum.  Left lower quadrant pain. EXAM: CT ABDOMEN AND PELVIS WITHOUT CONTRAST TECHNIQUE: Multidetector CT imaging of the abdomen and pelvis was performed following the standard protocol without IV contrast. COMPARISON:  02/08/2017 at 00:10 FINDINGS: Lower chest: Calcified pleural plaque and atelectatic appearing lung base opacities are again evident. No pleural effusions. No interval change. Hepatobiliary: Unchanged subtle hypodense lesion in segment 4B, concerning for possible metastatic deposit. No other focal liver lesions. Cholecystectomy. No bile duct dilatation. Pancreas: Unremarkable. No pancreatic ductal dilatation or surrounding inflammatory changes. Spleen: Normal in size without focal abnormality. Adrenals/Urinary Tract: Left adrenal adenoma. No acute urinary tract abnormalities. Stomach/Bowel: Oral contrast has been administered. The gastrojejunostomy is patent. There is a perianastomotic ulceration of the jejunum, and this appears likely to be the source of the pneumoperitoneum. This is seen on sagittal series 204 images 119 through 125. There also is inflammatory appearing stranding in the fat around the proximal jejunum. No bowel obstruction. Vascular/Lymphatic: Extensive atherosclerotic aortic calcification. Infrarenal abdominal aortic aneurysm measuring 3.3 cm. Reproductive: Unremarkable Other: No ascites. Musculoskeletal: No significant skeletal lesion. IMPRESSION: 1. Repeat scanning with enteric contrast demonstrates patency of the gastrojejunostomy. There is a perianastomotic ulcer of the jejunum adjacent to the gastrojejunostomy, and this appears to communicate directly into the pneumoperitoneum. 2. No other new findings compared to the earlier study.  Electronically Signed   By: Andreas Newport M.D.   On: 02/26/2017 05:58   Dg Chest Port 1 View  Result Date: 02/22/2017 CLINICAL DATA:  Left lower quadrant pain, onset today.  Dyspnea. EXAM: PORTABLE CHEST 1 VIEW COMPARISON:  09/20/2016, 12/12/2016. FINDINGS: Spiculated left upper lobe mass again evident. Patchy opacity in the lateral left base could represent a infectious infiltrate. This is new. Emphysematous and fibrotic appearing changes persist. Unchanged mild cardiomegaly. IMPRESSION: Patchy opacity in the lateral left base could represent pneumonia. Hyperinflation and cardiomegaly are unchanged. Persistent left upper lobe spiculated mass.  Electronically Signed   By: Andreas Newport M.D.   On: 02/20/2017 23:17   Ct Renal Stone Study  Result Date: 02/22/2017 CLINICAL DATA:  81 y/o M; left lower quadrant abdominal pain. History of stomach ulcer, GERD, duodenum perforation, abdominal aortic aneurysm, colon surgery, cholecystectomy, and lung mass. EXAM: CT ABDOMEN AND PELVIS WITHOUT CONTRAST TECHNIQUE: Multidetector CT imaging of the abdomen and pelvis was performed following the standard protocol without IV contrast. COMPARISON:  09/20/2016 PET-CT.  06/22/2011 CT abdomen and pelvis. FINDINGS: Lower chest: Stable right lower lobe atelectasis and round atelectasis. Calcific atherosclerosis of the aortic valve. AICD lead noted within the right ventricle. Right calcific pleural thickening posteriorly. Hepatobiliary: Ill-defined segment 4B hypoattenuating lesion measuring 16 mm new from prior studies (series 3: Image 24). Pancreas: Unremarkable. No pancreatic ductal dilatation or surrounding inflammatory changes. Spleen: Normal in size without focal abnormality. Adrenals/Urinary Tract: 27 x 25 mm left adrenal mass. Multiple well-circumscribed fluid attenuating foci within the kidneys bilaterally compatible with cysts the largest in the left interpolar region measuring 22 mm. There is an isodense exophytic  structure of the left kidney interpolar region measuring 10 mm (series 3, image 36) probably representing a hemorrhagic cyst that is stable. No hydronephrosis. Normal bladder. Stomach/Bowel: Postsurgical changes of proximal duodenum. Patent loop gastrojejunostomy. There are inflammatory changes of fat surrounding loops of wall thickened jejunum in the left upper quadrant extending to the gastrojejunal junction and there is a small volume of pneumoperitoneum. No evidence for bowel obstruction. Vascular/Lymphatic: Aortic atherosclerosis. No enlarged abdominal or pelvic lymph nodes. 3.3 cm infrarenal abdominal aortic aneurysm. Reproductive: Moderate prostate enlargement. Other: No abdominal wall hernia or abnormality. No abdominopelvic ascites. Musculoskeletal: Advanced cervical spondylosis greatest at the L5-S1 level with there is severe disc space narrowing and lower lumbar facet arthrosis. No acute osseous abnormality identified. Mild bilateral hip osteoarthrosis. IMPRESSION: 1. Inflammatory changes surrounding loops of thickened jejunum near the gastrojejunostomy may be infectious, inflammatory, or related to ischemia. Small volume of pneumoperitoneum indicates perforation. 2. New ill-defined liver segment 4B hypodense lesion measuring 16 mm, possibly metastasis. 3. Right posterior lower lobe calcified pleural plaque and round atelectasis is stable. 4. 3.3 cm stable infrarenal abdominal aortic aneurysm. Recommend followup by ultrasound in 3 years. This recommendation follows ACR consensus guidelines: White Paper of the ACR Incidental Findings Committee II on Vascular Findings. J Am Coll Radiol 2013; 10:789-794. 5. Moderate prostate enlargement. 6. Stable left adrenal adenoma. These results were called by telephone at the time of interpretation on 02/05/2017 at 1:06 am to Dr. Waynetta Pean , who verbally acknowledged these results. Electronically Signed   By: Kristine Garbe M.D.   On: 03/05/2017 01:06     EKG: Independently reviewed.   Assessment/Plan  1. Bowel Perforation Pt receiving IV antibiotics,Vancomycin and Zosyn. Dr. Hulen Skains planning exploratory surgery  2. Pneumonia.  Pt on 02, He has been given Vancomycin and zosyn.   3. Hypotension Pt receiving IV fluids   4.Hypoxia Pt is on 02 at home, 02 increased in ED to 5.   5. CHF, EF of 30 in 2012.  Will monitor for CHF due to fluid resuscitation   6 CAD cath in 07/2011  7. COPD Continue inhalers and 02.   8. Hypertension.   Pt currently hypotensive. Holding antihypertensive medications    9. Polycythemia Vera. Current hemoglobin is 16.   10. DVT and PE 2016. SCD's used. Anticoagulation held until after surgery. Will monitor closely.   DVT prophylaxis: SCD's  lovenox  Code Status: Full  Family Communication: Daughter at wedside Disposition Plan: Admission Consults called: [place],  CCM  Admission status: [place] [facility] Pager [phone]  If 7PM-7AM, please contact night-coverage [email or web address] Password [facility]  02/19/2017, 7:56 AM

## 2017-02-12 NOTE — Anesthesia Procedure Notes (Signed)
Central Venous Catheter Insertion Performed by: Neshawn Aird, anesthesiologist Patient location: Pre-op. Preanesthetic checklist: patient identified, IV checked, site marked, risks and benefits discussed, surgical consent, monitors and equipment checked, pre-op evaluation, timeout performed and anesthesia consent Position: Trendelenburg Lidocaine 1% used for infiltration and patient sedated Hand hygiene performed , maximum sterile barriers used  and Seldinger technique used Catheter size: 8 Fr Total catheter length 16. Central line was placed.Double lumen Procedure performed using ultrasound guided technique. Ultrasound Notes:anatomy identified, needle tip was noted to be adjacent to the nerve/plexus identified, no ultrasound evidence of intravascular and/or intraneural injection and image(s) printed for medical record Attempts: 1 Following insertion, dressing applied, line sutured and Biopatch. Post procedure assessment: blood return through all ports, free fluid flow and no air  Patient tolerated the procedure well with no immediate complications.          

## 2017-02-12 NOTE — Transfer of Care (Signed)
Immediate Anesthesia Transfer of Care Note  Patient: Jesus Sanders  Procedure(s) Performed: Procedure(s): REPAIR OF PERFORATED ULCER (N/A)  Patient Location: PACU  Anesthesia Type:General  Level of Consciousness: sedated  Airway & Oxygen Therapy: Patient remains intubated per anesthesia plan and Patient placed on Ventilator (see vital sign flow sheet for setting)  Post-op Assessment: Report given to RN and Post -op Vital signs reviewed and stable  Post vital signs: Reviewed and stable  Last Vitals:  Vitals:   02/08/2017 1600 02/18/2017 1602  BP: 131/79 126/71  Pulse: 90 92  Resp: 16 18  Temp:      Last Pain:  Vitals:   02/18/2017 0506  TempSrc:   PainSc: Asleep         Complications: No apparent anesthesia complications

## 2017-02-12 NOTE — ED Notes (Signed)
NRB mask applied , pt. desat at 82% using nasal canulla 4 lpm , PA notified .

## 2017-02-12 NOTE — Op Note (Signed)
Preoperative diagnosis: Peritonitis with perforated viscus  Postoperative diagnosis: Perforated marginal ulcer pre-existing gastrojejunostomy  Procedure: Exploratory laparotomy with closure of marginal ulcer  Surgeon: Erroll Luna M.D.  Anesthesia: Gen.  EBL: 70 mL  Specimens: None  Drains: 2 round 19 Blake drains 12 upper abdomen the second of the pelvis  Indications for procedure: The patient's a chronically ill 81 year old male with a 1 day history of epigastric abdominal pain. Initial CT scans T scan was obtained from kidney stones which showed free air and a possible perforation that of pre-existing gastrojejunostomy. A contrasted CT scan was obtained which showed perforation and free flow of contrast from an ulcer at the gastrojejunostomy consistent with a marginal ulcer. The patient is unclear of his previous abdominal surgery. He states he had his gallbladder removed many many years ago. His family is unclear as well. He may have a remote history of peptic ulcer disease but this was well over 30 years ago. I discussed operative intervention for this. He is morbidly ill and without surgery he is more than likely want to die. With surgery, he has a 1 in 3 chance of a major complication and 093% risk of mortality. The patient and family wish to proceed with surgery.The procedure has been discussed with the patient.  Alternative therapies have been discussed with the patient.  Operative risks include bleeding,  Infection,  Organ injury,  Nerve injury,  Blood vessel injury,  DVT,  Pulmonary embolism,  Death,  And possible reoperation.  Medical management risks include worsening of present situation.  The success of the procedure is 50 -90 % at treating patients symptoms.  The patient understands and agrees to proceed.   Description of procedure: The patient was taken from the holding area to the operating room. There was placed upon the operating room table. After induction of general  anesthesia, anesthesia placed an A-line and central venous access. Foley catheter was placed in the operating room. He was already on preoperative antibiotics. His abdomen was prepped and draped in sterile fashion and timeout was done. Midline incision was used from the xiphoid process to below the umbilicus. Dissection was carried down the midline until the linea alba was encountered and this was opened in the midline. The abdominal cavity is entered and there was bilious copious fluid with in the abdominal cavity with signs of peritonitis. Retractor was placed. Upon examination of his anatomy he had a gastrojejunostomy. This was a deep gastrojejunostomy. He had dense right upper quadrant adhesions making the liver very difficult to examine. I could feel the right lobe and part of the left lobe of felt no masses. There is a marginal ulceration at the gastrojejunostomy. I followed the inferior limb all the way to the ileocecal valve. Descending colon was normal. Cecum was normal and appendix are normal. The descending colon sigmoid colon and rectum were normal. The transverse colon was normal. I then traced in ano the of the limb to the gastrojejunostomy. I then traced this back to the ligament of Treitz. The ulceration was identified. Nasogastric tube is positioned by anesthesia. I then closed the perforation with 2-0 Vicryl. A piece of omentum was in secured down over this to past repair with 3-0 Vicryl. 6 L of irrigation were used. There is no signs of leakage. 2 round Blake drains were placed one in the upper abdomen the second in the lower abdomen and pelvic region. The left-sided drain went to the epigastrium the right-sided drain went to the pelvis. These are  secured with 2-0 nylon. Fascia was then closed with double-stranded #1 PDS. Skin was packed open. Dry dressings applied on top of this. All final counts are found to be correct. The patient was then taken the ICU intubated in critical but stable  condition.

## 2017-02-12 NOTE — Interval H&P Note (Signed)
History and Physical Interval Note:  02/14/2017 1:17 PM  Jesus Sanders  has presented today for surgery, with the diagnosis of Perforated gastrojejunostomy  The various methods of treatment have been discussed with the patient and family. After consideration of risks, benefits and other options for treatment, the patient has consented to  Procedure(s): REPAIR OF PERFORATED ULCER (N/A) as a surgical intervention .  The patient's history has been reviewed, patient examined, no change in status, stable for surgery.  I have reviewed the patient's chart and labs.  Questions were answered to the patient's satisfaction.   Pt is critically ill and requires laparotomy at this point   Very complex medical history Risk of complications over 30 % and mortality at 8 - 10 %  Family and patient wish to proceed.   Jesus Leven A.

## 2017-02-12 NOTE — Progress Notes (Addendum)
Pharmacy Antibiotic Note  Jesus Sanders is a 81 y.o. male admitted on 02/05/2017 with intra-abdominal infection.  Pharmacy has been consulted for fluconazole, Zosyn, and vancomycin dosing.  Plan: Fluconazole '400mg'$  IV q24h for systemic infection Zosyn 3.375g IV q8h Vancomycin '750mg'$  IV q12h Follow surgical plan, clinical progression, c/s, renal function, level PRN  Height: 6' (182.9 cm) Weight: 220 lb (99.8 kg) IBW/kg (Calculated) : 77.6  Temp (24hrs), Avg:97.6 F (36.4 C), Min:97.6 F (36.4 C), Max:97.6 F (36.4 C)   Recent Labs Lab 03/03/2017 2030 02/18/2017 0323 02/21/2017 0624  WBC 7.5  --   --   CREATININE 1.24  --   --   LATICACIDVEN  --  2.4* 2.5*    Estimated Creatinine Clearance: 60.1 mL/min (by C-G formula based on SCr of 1.24 mg/dL).    No Known Allergies  Antimicrobials this admission: Vancomycin 4/8 >> Zosyn 4/8 >> Fluconazole 4/8>>  Dose adjustments this admission: n/a  Microbiology results: 4/8 BCx: ordered   Thank you for allowing pharmacy to be a part of this patient's care.  Chontel Warning D. Shawnna Pancake, PharmD, BCPS Clinical Pharmacist Pager: [phone] 03/05/2017 12:56 PM

## 2017-02-12 NOTE — ED Notes (Signed)
Spoke with critical care ordered IV fluids for patient's blood pressure.

## 2017-02-12 NOTE — ED Notes (Signed)
Patient transported to CT 

## 2017-02-12 NOTE — ED Notes (Signed)
Dr. Hulen Skains / Dr. Lucia Gaskins ( surgeons ) at bedside evaluating pt.

## 2017-02-12 NOTE — H&P (View-Only) (Signed)
Reason for Consult:Free air on renal CT scan of the abdomen Referring Physician: Fabrice Dyal is an 81 y.o. male.  HPI: Patient starting getting sick yesterday ab out 7:30 PM with LUQ abdominal  Pain, at that time excruciating.  No nausea of vomiting,.  Lots of significant medical  Problem including oxygen dependent COPD and CHF.  Has a defibrillator in place.  Apparently takes Eliquis  Past Medical History:  Diagnosis Date  . AAA (abdominal aortic aneurysm) ([place])    a. CT 07/2015 - mild aneurysmal dilatation of the distal abdominal aorta measuring 3.2 x 3.4 cm.  Marland Kitchen CAD (coronary artery disease)    a. 07/2011 Anterior apical STEMI/Cath/PCI: LM nl, LAD 100p/m (2.5 x 45m Mini-Vision BMS), LCX 40p, RCA dominant, nl, EF 30%.  . Chronic bronchitis   . Chronic respiratory failure ([place]   . COPD (chronic obstructive pulmonary disease) ([place]   . Dilatation of thoracic aorta ([place]    a. CT 07/2015 - aneurysmal dilatation of the descending thoracic aorta measuring 3.9 cm in greatest diameter.  . Duodenal perforation ([facility] June 2012  . GERD (gastroesophageal reflux disease)   . History of DVT (deep vein thrombosis)   . Hypertension   . Ischemic cardiomyopathy    a. 01/2012 S/P MDT Protecta single lead ICD, ser # [id number]  . Myocardial infarction   . Peritonitis ([facility] June 2012  . Pneumonia April 2012  . Polycythemia vera(238.4)    a. [place]  will restart his phlebotomies from about Mar 15 2011  . Popliteal aneurysm ([place]   . Pulmonary embolism ([place]    a. Diagnosed 07/2015 - placed on Eliquis.  . Shortness of breath   . Stomach ulcer   . Systolic CHF, chronic ([place]    a. 12/2011 Echo: EF 25-30%, mid-dist antsept/inf, apical AK, Gr 1 DD, Triv AI, Mild MR.  . Tobacco abuse    a. cigars    Past Surgical History:  Procedure Laterality Date  . CARDIAC CATHETERIZATION  Sept 2012   Normal left main, occluded LAD, 40%  LCX and normal RCA. EF is 30%  . CARDIAC DEFIBRILLATOR PLACEMENT     single chamber  . CHOLECYSTECTOMY  03/2011   Dr. TMarlou Starks . COLON SURGERY    . IMPLANTABLE CARDIOVERTER DEFIBRILLATOR IMPLANT N/A 01/11/2012   Procedure: IMPLANTABLE CARDIOVERTER DEFIBRILLATOR IMPLANT;  Surgeon: SDeboraha Sprang MD;  [facility];  Service: Cardiovascular;  Laterality: N/A; [employer]  . PERIPHERAL VASCULAR CATHETERIZATION N/A 08/05/2015   Procedure: Lower Extremity Angiography;  Surgeon: VSerafina Mitchell MD;  [facility];  Service: Cardiovascular;  Laterality: N/A;  . SHOULDER ARTHROSCOPY     left, rotatotor cuff tendinopathy  . [place]  Sept 2012   EF 25 to 30% with akinesis of the mid to distal anterior and apical myocardium, trivial AI and no apical thrombus    Family History  Problem Relation Age of Onset  . Lung disease Father     [name] [place] at a [place]  reports that he quit smoking about 3 years ago. His smoking use included Cigars. He has a 30.00 pack-year smoking history. His smokeless tobacco use includes Chew. He reports that he drinks alcohol. He reports that he does not use drugs.  Allergies: No Known Allergies  Medications: I have reviewed the patient's current medications.  Results for orders placed or performed  during the hospital encounter of 02/14/2017 (from the past 48 hour(s))  Comprehensive metabolic panel     Status: Abnormal   Collection Time: 02/23/2017  8:30 PM  Result Value Ref Range   Sodium 140 135 - 145 mmol/L   Potassium 5.2 (H) 3.5 - 5.1 mmol/L   Chloride 104 101 - 111 mmol/L   CO2 31 22 - 32 mmol/L   Glucose, Bld 108 (H) 65 - 99 mg/dL   BUN 20 6 - 20 mg/dL   Creatinine, Ser 1.24 0.61 - 1.24 mg/dL   Calcium 9.0 8.9 - 10.3 mg/dL   Total Protein 6.3 (L) 6.5 - 8.1 g/dL   Albumin 3.1 (L) 3.5 - 5.0 g/dL   AST 22 15 - 41 U/L   ALT 9 (L) 17 - 63 U/L   Alkaline Phosphatase 83 38 - 126 U/L   Total Bilirubin  1.3 (H) 0.3 - 1.2 mg/dL   GFR calc non Af Amer 54 (L) >60 mL/min   GFR calc Af Amer >60 >60 mL/min    Comment: (NOTE) The eGFR has been calculated using the CKD EPI equation. This calculation has not been validated in all clinical situations. eGFR's persistently <60 mL/min signify possible Chronic Kidney Disease.    Anion gap 5 5 - 15  Lipase, blood     Status: None   Collection Time: 02/05/2017  8:30 PM  Result Value Ref Range   Lipase 16 11 - 51 U/L  CBC with Differential     Status: Abnormal   Collection Time: 02/10/2017  8:30 PM  Result Value Ref Range   WBC 7.5 4.0 - 10.5 K/uL   RBC 5.79 4.22 - 5.81 MIL/uL   Hemoglobin 16.1 13.0 - 17.0 g/dL   HCT 51.0 39.0 - 52.0 %   MCV 88.1 78.0 - 100.0 fL   MCH 27.8 26.0 - 34.0 pg   MCHC 31.6 30.0 - 36.0 g/dL   RDW 15.7 (H) 11.5 - 15.5 %   Platelets 129 (L) 150 - 400 K/uL   Neutrophils Relative % 77 %   Neutro Abs 5.8 1.7 - 7.7 K/uL   Lymphocytes Relative 14 %   Lymphs Abs 1.1 0.7 - 4.0 K/uL   Monocytes Relative 7 %   Monocytes Absolute 0.5 0.1 - 1.0 K/uL   Eosinophils Relative 2 %   Eosinophils Absolute 0.2 0.0 - 0.7 K/uL   Basophils Relative 0 %   Basophils Absolute 0.0 0.0 - 0.1 K/uL  Urinalysis, Routine w reflex microscopic     Status: Abnormal   Collection Time: 02/08/2017  8:48 PM  Result Value Ref Range   Color, Urine YELLOW YELLOW   APPearance CLEAR CLEAR   Specific Gravity, Urine 1.021 1.005 - 1.030   pH 5.0 5.0 - 8.0   Glucose, UA NEGATIVE NEGATIVE mg/dL   Hgb urine dipstick LARGE (A) NEGATIVE   Bilirubin Urine NEGATIVE NEGATIVE   Ketones, ur NEGATIVE NEGATIVE mg/dL   Protein, ur 30 (A) NEGATIVE mg/dL   Nitrite NEGATIVE NEGATIVE   Leukocytes, UA NEGATIVE NEGATIVE   RBC / HPF 6-30 0 - 5 RBC/hpf   WBC, UA 0-5 0 - 5 WBC/hpf   Bacteria, UA RARE (A) NONE SEEN   Squamous Epithelial / LPF 0-5 (A) NONE SEEN   Mucous PRESENT   Brain natriuretic peptide     Status: Abnormal   Collection Time: 03/06/2017 10:56 PM  Result Value  Ref Range   B Natriuretic Peptide 107.5 (H) 0.0 - 100.0 pg/mL  I-stat troponin, ED     Status: None   Collection Time: 02/25/2017 11:27 PM  Result Value Ref Range   Troponin i, poc 0.00 0.00 - 0.08 ng/mL   Comment 3            Comment: Due to the release kinetics of cTnI, a negative result within the first hours of the onset of symptoms does not rule out myocardial infarction with certainty. If myocardial infarction is still suspected, repeat the test at appropriate intervals.     Dg Chest Port 1 View  Result Date: 02/15/2017 CLINICAL DATA:  Left lower quadrant pain, onset today.  Dyspnea. EXAM: PORTABLE CHEST 1 VIEW COMPARISON:  09/20/2016, 12/12/2016. FINDINGS: Spiculated left upper lobe mass again evident. Patchy opacity in the lateral left base could represent a infectious infiltrate. This is new. Emphysematous and fibrotic appearing changes persist. Unchanged mild cardiomegaly. IMPRESSION: Patchy opacity in the lateral left base could represent pneumonia. Hyperinflation and cardiomegaly are unchanged. Persistent left upper lobe spiculated mass. Electronically Signed   By: Andreas Newport M.D.   On: 02/05/2017 23:17   Ct Renal Stone Study  Result Date: 02/20/2017 CLINICAL DATA:  81 y/o M; left lower quadrant abdominal pain. History of stomach ulcer, GERD, duodenum perforation, abdominal aortic aneurysm, colon surgery, cholecystectomy, and lung mass. EXAM: CT ABDOMEN AND PELVIS WITHOUT CONTRAST TECHNIQUE: Multidetector CT imaging of the abdomen and pelvis was performed following the standard protocol without IV contrast. COMPARISON:  09/20/2016 PET-CT.  06/22/2011 CT abdomen and pelvis. FINDINGS: Lower chest: Stable right lower lobe atelectasis and round atelectasis. Calcific atherosclerosis of the aortic valve. AICD lead noted within the right ventricle. Right calcific pleural thickening posteriorly. Hepatobiliary: Ill-defined segment 4B hypoattenuating lesion measuring 16 mm new from prior  studies (series 3: Image 24). Pancreas: Unremarkable. No pancreatic ductal dilatation or surrounding inflammatory changes. Spleen: Normal in size without focal abnormality. Adrenals/Urinary Tract: 27 x 25 mm left adrenal mass. Multiple well-circumscribed fluid attenuating foci within the kidneys bilaterally compatible with cysts the largest in the left interpolar region measuring 22 mm. There is an isodense exophytic structure of the left kidney interpolar region measuring 10 mm (series 3, image 36) probably representing a hemorrhagic cyst that is stable. No hydronephrosis. Normal bladder. Stomach/Bowel: Postsurgical changes of proximal duodenum. Patent loop gastrojejunostomy. There are inflammatory changes of fat surrounding loops of wall thickened jejunum in the left upper quadrant extending to the gastrojejunal junction and there is a small volume of pneumoperitoneum. No evidence for bowel obstruction. Vascular/Lymphatic: Aortic atherosclerosis. No enlarged abdominal or pelvic lymph nodes. 3.3 cm infrarenal abdominal aortic aneurysm. Reproductive: Moderate prostate enlargement. Other: No abdominal wall hernia or abnormality. No abdominopelvic ascites. Musculoskeletal: Advanced cervical spondylosis greatest at the L5-S1 level with there is severe disc space narrowing and lower lumbar facet arthrosis. No acute osseous abnormality identified. Mild bilateral hip osteoarthrosis. IMPRESSION: 1. Inflammatory changes surrounding loops of thickened jejunum near the gastrojejunostomy may be infectious, inflammatory, or related to ischemia. Small volume of pneumoperitoneum indicates perforation. 2. New ill-defined liver segment 4B hypodense lesion measuring 16 mm, possibly metastasis. 3. Right posterior lower lobe calcified pleural plaque and round atelectasis is stable. 4. 3.3 cm stable infrarenal abdominal aortic aneurysm. Recommend followup by ultrasound in 3 years. This recommendation follows ACR consensus guidelines:  White Paper of the ACR Incidental Findings Committee II on Vascular Findings. J Am Coll Radiol 2013; 10:789-794. 5. Moderate prostate enlargement. 6. Stable left adrenal adenoma. These results were called by telephone at the time of  interpretation on 02/28/2017 at 1:06 am to Dr. Waynetta Pean , who verbally acknowledged these results. Electronically Signed   By: Kristine Garbe M.D.   On: 03/04/2017 01:06    Review of Systems  Constitutional: Negative for chills and fever.  Gastrointestinal: Positive for abdominal pain.       Poor appetite   Blood pressure 139/82, pulse 99, temperature 97.6 F (36.4 C), temperature source Oral, resp. rate (!) 27, height 6' (1.829 m), weight 99.8 kg (220 lb), SpO2 98 %. Physical Exam  Constitutional: He is oriented to person, place, and time.  Neck: Normal range of motion.  Respiratory: Accessory muscle usage present. Tachypnea noted. He is in respiratory distress. He has decreased breath sounds in the right lower field and the left lower field. He has no wheezes.  GI: Soft. Bowel sounds are normal. He exhibits distension. There is tenderness in the left upper quadrant. There is guarding (LUQ only). There is no rigidity.  Musculoskeletal: Normal range of motion.  Neurological: He is alert and oriented to person, place, and time.  Skin: Skin is warm. Bruising, ecchymosis and laceration (skin tears) noted.  Psychiatric: His mood appears anxious.    Assessment/Plan: Patient is not on steroids.  His examination is fairly benign, but he does have free air on CT.  This was done without contrast because the were looking for a stone.  She needs a CT with oral only to delineate anatomy of the distal stomach and determine if there is leakage of contrast  Mauriana Dann 02/19/2017, 2:41 AM    I reviewed the repeat CT scan of the abdomen with oral contrast with the radiologist and it shows an ulcer near a previous suture line of a gastrojejunostomy.  The contrast  leaks towards the area of free air, but not into the cavity.  The family is unaware of any previous gastric operation, but it appears to be a BII type gastrojejunostomy along the greater curvature of the stomach.  The patient has significant medical problems including COPD and CHF.  Last EF I have seen documented is 25%.  He has an AICD in place on the left side.  However, he no longer takes Eliquis because it was too expensive.  He will need exploration for perforated ulcer today.Kathryne Eriksson Dahlia Bailiff, MD, [place] [phone] [facility]

## 2017-02-12 NOTE — ED Notes (Signed)
Pt. Placed on a nasal canulla 4 lpm per PA.

## 2017-02-12 NOTE — Consult Note (Signed)
PULMONARY / CRITICAL CARE MEDICINE   Name: Jesus Sanders MRN: [id number] DOB: 10/30/1936    ADMISSION DATE:  02/28/2017 CONSULTATION DATE:  02/19/2017   REFERRING MD:   Dr. Nehemiah Settle  CHIEF COMPLAINT:  Abdominal Pain   HISTORY OF PRESENT ILLNESS:  81 y/o M who presented to [facility] on 4/7 with complaints of abdominal pain.    Daughter is at bedside and provides information as patient is altered.   She reports he was in his usual state of health until 4/7 when he called her with acute onset left sided abdominal pain.  His granddaughter lives with him and she noted he had slept most of the day on 4/7.  He asked his daughter to call EMS and when they arrived he asked for pain medications (which the daughter reports as very unusual).  He is a former smoker (quit 2015, 1ppd off / on since age 41) and former 68. They noted he felt better while sitting up in regards to pain.  "He looks miserable when he lays down".  He had a PE approximately one year ago but has been off Eliquis due to cost.    Initial ER evaluation notable for Na 140, K 5.2, Sr Cr 1.24, albumin 3.1, BNP 107, troponin 0, WBC 7.5, hgb 16.1 and platelets 129.  CT Renal study completed which showed inflammatory changes surrounding the loops of thickened jejunum near the gastrojejunostomy, small volume of pneumoperitoneum, new ill defined liver segment with hypodense lesion (16 mm), R posterior lobe calcified pleural plaque, 3.3 cm stable infrarenal abdominal aortic aneurysm.  Repeat CT with enteric contrast showed patency of the gastrojejunostomy but revealed a peri-anastomotic ulcer of the jejunum which directly communicated with the pneumoperitoneum.  The patient's blood pressure was intermittently low and he was treated with 1500 ml NS with improvement.  He was covered with vancomycin + zosyn.  CCS consulted and he is pending surgery.  CXR concerning for LLL atelectasis vs infiltrate.   PCCM called for evaluation of possible vent needs  post-operatively.     PAST MEDICAL HISTORY :  He  has a past medical history of AAA (abdominal aortic aneurysm) ([place]); CAD (coronary artery disease); Chronic bronchitis; Chronic respiratory failure ([place]); COPD (chronic obstructive pulmonary disease) ([place]); Dilatation of thoracic aorta ([place]); Duodenal perforation [facility]) (June 2012); GERD (gastroesophageal reflux disease); History of DVT (deep vein thrombosis); Hypertension; Ischemic cardiomyopathy; Myocardial infarction; Peritonitis [facility]) (June 2012); Pneumonia (April 2012); Polycythemia vera(238.4); Popliteal aneurysm ([place]); Pulmonary embolism ([place]); Shortness of breath; Stomach ulcer; Systolic CHF, chronic ([place]); and Tobacco abuse.  PAST SURGICAL HISTORY: He  has a past surgical history that includes Cholecystectomy (03/2011); Colon surgery; Cardiac catheterization (Sept 2012); US ECHOCARDIOGRAPHY (Sept 2012); Cardiac defibrillator placement; Shoulder arthroscopy; implantable cardioverter defibrillator implant (N/A, 01/11/2012); and Cardiac catheterization (N/A, 08/05/2015).  No Known Allergies  No current facility-administered medications on file prior to encounter.    Current Outpatient Prescriptions on File Prior to Encounter  Medication Sig  . aspirin EC 81 MG EC tablet Take 1 tablet (81 mg total) by mouth daily.  Marland Kitchen BROVANA 15 MCG/2ML NEBU USE ONE VIAL IN NEBULIZER TWICE DAILY  . budesonide (PULMICORT) 0.25 MG/2ML nebulizer solution USE ONE VIAL IN NEBULIZER TWICE DAILY  . furosemide (LASIX) 40 MG tablet Take 20 mg by mouth daily.   Marland Kitchen lovastatin (MEVACOR) 20 MG tablet Take 20 mg by mouth at bedtime.  . metoprolol succinate (TOPROL-XL) 100 MG 24 hr tablet Take 1 tablet (100 mg total) by mouth 2 (  two) times daily.  . ranitidine (ZANTAC) 150 MG tablet Take 150 mg by mouth daily.    FAMILY HISTORY:  His indicated that his mother is deceased. He indicated that his father is deceased. He indicated that all of his three brothers are alive. He  indicated that his maternal grandmother is deceased. He indicated that his maternal grandfather is deceased. He indicated that his paternal grandmother is deceased. He indicated that his paternal grandfather is deceased.    SOCIAL HISTORY: He  reports that he quit smoking about 3 years ago. His smoking use included Cigars. He has a 30.00 pack-year smoking history. His smokeless tobacco use includes Chew. He reports that he drinks alcohol. He reports that he does not use drugs.  REVIEW OF SYSTEMS:  Unable to complete as patient is altered / confused.  SUBJECTIVE:    VITAL SIGNS: BP 103/83   Pulse (!) 101   Temp 97.6 F (36.4 C) (Oral)   Resp (!) 27   Ht 6' (1.829 m)   Wt 220 lb (99.8 kg)   SpO2 97%   BMI 29.84 kg/m   HEMODYNAMICS:    VENTILATOR SETTINGS:    INTAKE / OUTPUT: I/O last 3 completed shifts: In: 750 [I.V.:750] Out: -   PHYSICAL EXAMINATION: General:  Well developed elderly male, appears ill  HEENT: MM pink/dry, no jvd Neuro: confused / easily reoriented, MAE CV: s1s2 rrr, no m/r/g PULM: even/non-labored, lungs bilaterally clear VO:HYWV, non-tender, bsx4 active  Extremities: warm/dry, no edema  Skin: no rashes or lesions, BLE changes consistent with PVD   LABS:  BMET  Recent Labs Lab 02/20/2017 2030  NA 140  K 5.2*  CL 104  CO2 31  BUN 20  CREATININE 1.24  GLUCOSE 108*    Electrolytes  Recent Labs Lab 02/09/2017 2030  CALCIUM 9.0    CBC  Recent Labs Lab 02/07/2017 2030  WBC 7.5  HGB 16.1  HCT 51.0  PLT 129*    Coag's No results for input(s): APTT, INR in the last 168 hours.  Sepsis Markers  Recent Labs Lab 02/22/2017 0323 02/21/2017 0624  LATICACIDVEN 2.4* 2.5*    ABG No results for input(s): PHART, PCO2ART, PO2ART in the last 168 hours.  Liver Enzymes  Recent Labs Lab 02/08/2017 2030  AST 22  ALT 9*  ALKPHOS 83  BILITOT 1.3*  ALBUMIN 3.1*    Cardiac Enzymes No results for input(s): TROPONINI, PROBNP in the last  168 hours.  Glucose No results for input(s): GLUCAP in the last 168 hours.  Imaging Ct Abdomen Pelvis Wo Contrast  Result Date: 02/20/2017 CLINICAL DATA:  Pneumoperitoneum.  Left lower quadrant pain. EXAM: CT ABDOMEN AND PELVIS WITHOUT CONTRAST TECHNIQUE: Multidetector CT imaging of the abdomen and pelvis was performed following the standard protocol without IV contrast. COMPARISON:  03/06/2017 at 00:10 FINDINGS: Lower chest: Calcified pleural plaque and atelectatic appearing lung base opacities are again evident. No pleural effusions. No interval change. Hepatobiliary: Unchanged subtle hypodense lesion in segment 4B, concerning for possible metastatic deposit. No other focal liver lesions. Cholecystectomy. No bile duct dilatation. Pancreas: Unremarkable. No pancreatic ductal dilatation or surrounding inflammatory changes. Spleen: Normal in size without focal abnormality. Adrenals/Urinary Tract: Left adrenal adenoma. No acute urinary tract abnormalities. Stomach/Bowel: Oral contrast has been administered. The gastrojejunostomy is patent. There is a perianastomotic ulceration of the jejunum, and this appears likely to be the source of the pneumoperitoneum. This is seen on sagittal series 204 images 119 through 125. There also is inflammatory appearing  stranding in the fat around the proximal jejunum. No bowel obstruction. Vascular/Lymphatic: Extensive atherosclerotic aortic calcification. Infrarenal abdominal aortic aneurysm measuring 3.3 cm. Reproductive: Unremarkable Other: No ascites. Musculoskeletal: No significant skeletal lesion. IMPRESSION: 1. Repeat scanning with enteric contrast demonstrates patency of the gastrojejunostomy. There is a perianastomotic ulcer of the jejunum adjacent to the gastrojejunostomy, and this appears to communicate directly into the pneumoperitoneum. 2. No other new findings compared to the earlier study. Electronically Signed   By: Andreas Newport M.D.   On: 03/03/2017 05:58    Dg Chest Port 1 View  Result Date: 02/17/2017 CLINICAL DATA:  Left lower quadrant pain, onset today.  Dyspnea. EXAM: PORTABLE CHEST 1 VIEW COMPARISON:  09/20/2016, 12/12/2016. FINDINGS: Spiculated left upper lobe mass again evident. Patchy opacity in the lateral left base could represent a infectious infiltrate. This is new. Emphysematous and fibrotic appearing changes persist. Unchanged mild cardiomegaly. IMPRESSION: Patchy opacity in the lateral left base could represent pneumonia. Hyperinflation and cardiomegaly are unchanged. Persistent left upper lobe spiculated mass. Electronically Signed   By: Andreas Newport M.D.   On: 02/23/2017 23:17   Ct Renal Stone Study  Result Date: 02/10/2017 CLINICAL DATA:  81 y/o M; left lower quadrant abdominal pain. History of stomach ulcer, GERD, duodenum perforation, abdominal aortic aneurysm, colon surgery, cholecystectomy, and lung mass. EXAM: CT ABDOMEN AND PELVIS WITHOUT CONTRAST TECHNIQUE: Multidetector CT imaging of the abdomen and pelvis was performed following the standard protocol without IV contrast. COMPARISON:  09/20/2016 PET-CT.  06/22/2011 CT abdomen and pelvis. FINDINGS: Lower chest: Stable right lower lobe atelectasis and round atelectasis. Calcific atherosclerosis of the aortic valve. AICD lead noted within the right ventricle. Right calcific pleural thickening posteriorly. Hepatobiliary: Ill-defined segment 4B hypoattenuating lesion measuring 16 mm new from prior studies (series 3: Image 24). Pancreas: Unremarkable. No pancreatic ductal dilatation or surrounding inflammatory changes. Spleen: Normal in size without focal abnormality. Adrenals/Urinary Tract: 27 x 25 mm left adrenal mass. Multiple well-circumscribed fluid attenuating foci within the kidneys bilaterally compatible with cysts the largest in the left interpolar region measuring 22 mm. There is an isodense exophytic structure of the left kidney interpolar region measuring 10 mm (series 3,  image 36) probably representing a hemorrhagic cyst that is stable. No hydronephrosis. Normal bladder. Stomach/Bowel: Postsurgical changes of proximal duodenum. Patent loop gastrojejunostomy. There are inflammatory changes of fat surrounding loops of wall thickened jejunum in the left upper quadrant extending to the gastrojejunal junction and there is a small volume of pneumoperitoneum. No evidence for bowel obstruction. Vascular/Lymphatic: Aortic atherosclerosis. No enlarged abdominal or pelvic lymph nodes. 3.3 cm infrarenal abdominal aortic aneurysm. Reproductive: Moderate prostate enlargement. Other: No abdominal wall hernia or abnormality. No abdominopelvic ascites. Musculoskeletal: Advanced cervical spondylosis greatest at the L5-S1 level with there is severe disc space narrowing and lower lumbar facet arthrosis. No acute osseous abnormality identified. Mild bilateral hip osteoarthrosis. IMPRESSION: 1. Inflammatory changes surrounding loops of thickened jejunum near the gastrojejunostomy may be infectious, inflammatory, or related to ischemia. Small volume of pneumoperitoneum indicates perforation. 2. New ill-defined liver segment 4B hypodense lesion measuring 16 mm, possibly metastasis. 3. Right posterior lower lobe calcified pleural plaque and round atelectasis is stable. 4. 3.3 cm stable infrarenal abdominal aortic aneurysm. Recommend followup by ultrasound in 3 years. This recommendation follows ACR consensus guidelines: White Paper of the ACR Incidental Findings Committee II on Vascular Findings. J Am Coll Radiol 2013; 10:789-794. 5. Moderate prostate enlargement. 6. Stable left adrenal adenoma. These results were called by telephone at the  time of interpretation on 03/01/2017 at 1:06 am to Dr. Waynetta Pean , who verbally acknowledged these results. Electronically Signed   By: Kristine Garbe M.D.   On: 02/18/2017 01:06     STUDIES:  CT Renal Study 4/8 >> inflammatory changes surrounding the  loops of thickened jejunum near the gastrojejunostomy, small volume of pneumoperitoneum, new ill defined liver segment with hypodense lesion (16 mm), R posterior lobe calcified pleural plaque, 3.3 cm stable infrarenal abdominal aortic aneurysm.   CT Abd with enteric contrast 4/8 >> showed patency of the gastrojejunostomy but revealed a peri-anastomotic ulcer of the jejunum which directly communicated with the pneumoperitoneum.    CULTURES: BCx2 4/8 >>    ANTIBIOTICS: Vanco 4/8 >>  Zosyn 4/8 >>  Diflucan 4/8 >>   SIGNIFICANT EVENTS: 4/08  Admit with abdominal pain, found to have a perforated gastric ulcer   LINES/TUBES:   DISCUSSION: 81 y/o M, former smoker, admitted with abdominal pain.  Work up revealed peri-anastomotic ulcer with leak.    ASSESSMENT / PLAN:  PULMONARY A: COPD  At Risk Prolonged Vent Needs Post-Op LUL Spiculated Mass Hx PE - not on anticoagulation  Former Tobacco Abuse   P:   Brovana + Pulmicort  PRN albuterol  Intermittent CXR Pulmonary hygiene   CARDIOVASCULAR A:  Sepsis - in setting of perforated ulcer Hx ICM, chronic sCHF, HTN, AAA, CAD  P:  Monitor hemodynamics closely  Gentle IVF's  See ID   RENAL A:   At Risk AKI  P:   Trend BMP / urinary output Replace electrolytes as indicated Avoid nephrotoxic agents, ensure adequate renal perfusion  GASTROINTESTINAL A:   16 mm Hypodense Liver Lesion - new on CT, hx of spiculated LUL nodule, ?infectious vs malignant Hx GERD, duodenal perforation (2012) P:   NPO  Pending Ex-Lap per CCS  Reviewed possible liver biopsy with CCS given LUL spiculated mass   HEMATOLOGIC A:   Polycythemia Vera P:  Trend CBC  SCD's for DVT prophylaxis   INFECTIOUS A:   Perforated Ulcer / Peritonitis  P:   ABX as above Follow cultures   ENDOCRINE A:   At Risk Hypo/Hyperglycemia   P:   Trend glucose   NEUROLOGIC A:   Pain  P:   RASS goal: 0 Pain control per CCS   FAMILY  - Updates:  Daughter  updated at bedside.  Patient has living will > would not want long term artifical support if no meaningful recovery.    - Inter-disciplinary family meet or Palliative Care meeting due by:  4/15  PCCM will follow up post surgery.   Noe Gens, NP-C [facility] Pgr: [phone] or if no answer [phone] 02/23/2017, 12:41 PM    ATTENDING NOTE / ATTESTATION NOTE :   I have discussed the case with the resident/APP  Noe Gens NP.   I agree with the resident/APP's  history, physical examination, assessment, and plans.    I have edited the above note and modified it according to our agreed history, physical examination, assessment and plan.   Briefly, 40-pack-year smoker, on 2 L oxygen 24/7, with COPD, also with recent diagnosis of pulmonary embolism for which he could not afford to be on oral anticoagulation, with CAD  and heart failure, comes in with acute worsening abdominal pain. He is dyspneic with minimal exertion on a good day.   CT Abd with enteric contrast 4/8  showed patency of the gastrojejunostomy but revealed a peri-anastomotic ulcer of the jejunum  which directly communicated with the pneumoperitoneum.  He was hypotensive, responding to IV fluids. Surgery saw the patient and plan was to go to the OR. PCCM was consulted for post op care as they anticipate he will require prolonged ventilation postoperatively.   Vitals:  Vitals:   02/27/2017 1210 03/05/2017 1230 02/16/2017 1245 02/26/2017 1309  BP: (!) 74/59 (!) 75/57 (!) 88/64 (!) 89/58  Pulse: 100 100 98   Resp: (!) 26 (!) 26 (!) 25   Temp:      TempSrc:      SpO2: 95% 97% 94%   Weight:      Height:        Constitutional/General: well-nourished, well-developed, not in any distress. In abdominal pain distress.  Body mass index is 29.84 kg/m. Wt Readings from Last 3 Encounters:  02/05/2017 99.8 kg (220 lb)  12/27/16 96.1 kg (211 lb 12.8 oz)  09/23/16 95.7 kg (211 lb)    HEENT: PERLA, anicteric sclerae.  (-) Oral thrush.  Neck: No masses. Midline trachea. No JVD, (-) LAD. (-) bruits appreciated.  Respiratory/Chest: Grossly normal chest. (-) deformity. (-) Accessory muscle use.  Symmetric expansion. Diminished BS on both lower lung zones. (-) wheezing, crackles, rhonchi (-) egophony  Cardiovascular: Regular rate and  rhythm, heart sounds normal, no murmur or gallops,  Trace peripheral edema  Gastrointestinal:  Dec BS. Soft, tender. No hepatosplenomegaly.  (-) masses.   Musculoskeletal:  Normal muscle tone.   Extremities: Grossly normal. (-) clubbing, cyanosis.  Trace edema  Skin: (-) rash,lesions seen.   Neurological/Psychiatric :. CN grossly intact. (-) lateralizing signs.     CBC Recent Labs     02/13/2017  2030  WBC  7.5  HGB  16.1  HCT  51.0  PLT  129*    Coag's No results for input(s): APTT, INR in the last 72 hours.  BMET Recent Labs     02/22/2017  2030  NA  140  K  5.2*  CL  104  CO2  31  BUN  20  CREATININE  1.24  GLUCOSE  108*    Electrolytes Recent Labs     02/09/2017  2030  CALCIUM  9.0    Sepsis Markers No results for input(s): PROCALCITON, O2SATVEN in the last 72 hours.  Invalid input(s): LACTICACIDVEN  ABG No results for input(s): PHART, PCO2ART, PO2ART in the last 72 hours.  Liver Enzymes Recent Labs     03/01/2017  2030  AST  22  ALT  9*  ALKPHOS  83  BILITOT  1.3*  ALBUMIN  3.1*    Cardiac Enzymes No results for input(s): TROPONINI, PROBNP in the last 72 hours.  Glucose No results for input(s): GLUCAP in the last 72 hours.  Imaging Ct Abdomen Pelvis Wo Contrast  Result Date: 02/25/2017 CLINICAL DATA:  Pneumoperitoneum.  Left lower quadrant pain. EXAM: CT ABDOMEN AND PELVIS WITHOUT CONTRAST TECHNIQUE: Multidetector CT imaging of the abdomen and pelvis was performed following the standard protocol without IV contrast. COMPARISON:  02/18/2017 at 00:10 FINDINGS: Lower chest: Calcified pleural plaque and atelectatic appearing  lung base opacities are again evident. No pleural effusions. No interval change. Hepatobiliary: Unchanged subtle hypodense lesion in segment 4B, concerning for possible metastatic deposit. No other focal liver lesions. Cholecystectomy. No bile duct dilatation. Pancreas: Unremarkable. No pancreatic ductal dilatation or surrounding inflammatory changes. Spleen: Normal in size without focal abnormality. Adrenals/Urinary Tract: Left adrenal adenoma. No acute urinary tract abnormalities. Stomach/Bowel: Oral contrast has been administered. The gastrojejunostomy is patent.  There is a perianastomotic ulceration of the jejunum, and this appears likely to be the source of the pneumoperitoneum. This is seen on sagittal series 204 images 119 through 125. There also is inflammatory appearing stranding in the fat around the proximal jejunum. No bowel obstruction. Vascular/Lymphatic: Extensive atherosclerotic aortic calcification. Infrarenal abdominal aortic aneurysm measuring 3.3 cm. Reproductive: Unremarkable Other: No ascites. Musculoskeletal: No significant skeletal lesion. IMPRESSION: 1. Repeat scanning with enteric contrast demonstrates patency of the gastrojejunostomy. There is a perianastomotic ulcer of the jejunum adjacent to the gastrojejunostomy, and this appears to communicate directly into the pneumoperitoneum. 2. No other new findings compared to the earlier study. Electronically Signed   By: Andreas Newport M.D.   On: 03/01/2017 05:58   Dg Chest Port 1 View  Result Date: 02/26/2017 CLINICAL DATA:  Left lower quadrant pain, onset today.  Dyspnea. EXAM: PORTABLE CHEST 1 VIEW COMPARISON:  09/20/2016, 12/12/2016. FINDINGS: Spiculated left upper lobe mass again evident. Patchy opacity in the lateral left base could represent a infectious infiltrate. This is new. Emphysematous and fibrotic appearing changes persist. Unchanged mild cardiomegaly. IMPRESSION: Patchy opacity in the lateral left base could represent  pneumonia. Hyperinflation and cardiomegaly are unchanged. Persistent left upper lobe spiculated mass. Electronically Signed   By: Andreas Newport M.D.   On: 02/08/2017 23:17   Ct Renal Stone Study  Result Date: 03/02/2017 CLINICAL DATA:  81 y/o M; left lower quadrant abdominal pain. History of stomach ulcer, GERD, duodenum perforation, abdominal aortic aneurysm, colon surgery, cholecystectomy, and lung mass. EXAM: CT ABDOMEN AND PELVIS WITHOUT CONTRAST TECHNIQUE: Multidetector CT imaging of the abdomen and pelvis was performed following the standard protocol without IV contrast. COMPARISON:  09/20/2016 PET-CT.  06/22/2011 CT abdomen and pelvis. FINDINGS: Lower chest: Stable right lower lobe atelectasis and round atelectasis. Calcific atherosclerosis of the aortic valve. AICD lead noted within the right ventricle. Right calcific pleural thickening posteriorly. Hepatobiliary: Ill-defined segment 4B hypoattenuating lesion measuring 16 mm new from prior studies (series 3: Image 24). Pancreas: Unremarkable. No pancreatic ductal dilatation or surrounding inflammatory changes. Spleen: Normal in size without focal abnormality. Adrenals/Urinary Tract: 27 x 25 mm left adrenal mass. Multiple well-circumscribed fluid attenuating foci within the kidneys bilaterally compatible with cysts the largest in the left interpolar region measuring 22 mm. There is an isodense exophytic structure of the left kidney interpolar region measuring 10 mm (series 3, image 36) probably representing a hemorrhagic cyst that is stable. No hydronephrosis. Normal bladder. Stomach/Bowel: Postsurgical changes of proximal duodenum. Patent loop gastrojejunostomy. There are inflammatory changes of fat surrounding loops of wall thickened jejunum in the left upper quadrant extending to the gastrojejunal junction and there is a small volume of pneumoperitoneum. No evidence for bowel obstruction. Vascular/Lymphatic: Aortic atherosclerosis. No enlarged  abdominal or pelvic lymph nodes. 3.3 cm infrarenal abdominal aortic aneurysm. Reproductive: Moderate prostate enlargement. Other: No abdominal wall hernia or abnormality. No abdominopelvic ascites. Musculoskeletal: Advanced cervical spondylosis greatest at the L5-S1 level with there is severe disc space narrowing and lower lumbar facet arthrosis. No acute osseous abnormality identified. Mild bilateral hip osteoarthrosis. IMPRESSION: 1. Inflammatory changes surrounding loops of thickened jejunum near the gastrojejunostomy may be infectious, inflammatory, or related to ischemia. Small volume of pneumoperitoneum indicates perforation. 2. New ill-defined liver segment 4B hypodense lesion measuring 16 mm, possibly metastasis. 3. Right posterior lower lobe calcified pleural plaque and round atelectasis is stable. 4. 3.3 cm stable infrarenal abdominal aortic aneurysm. Recommend followup by ultrasound in 3 years. This recommendation follows ACR consensus guidelines:  White Paper of the ACR Incidental Findings Committee II on Vascular Findings. J Am Coll Radiol 2013; 10:789-794. 5. Moderate prostate enlargement. 6. Stable left adrenal adenoma. These results were called by telephone at the time of interpretation on 02/19/2017 at 1:06 am to Dr. Waynetta Pean , who verbally acknowledged these results. Electronically Signed   By: Kristine Garbe M.D.   On: 03/06/2017 01:06    Assessment/Plan : Acute abdomen 2/2 perforated gastrojejunostomy - Surgery has seen the patient and plan is for have surgery today. - Anticipate  he will have complicated course post op. - cont IVF. Has received 2.5 L pre op. Blood pressure was still soft. I told ER nurse to give another liter prior to surgery. - cont broad spectrum abx (Vanc + Zosyn ) - Panculture - trend lactate  COPD. Very severe. O2 2L 24/7 at baseline - Pulmicort and Brovana BID - duoneb qid - He does not seem to be in acute exacerbation. We'll hold off on IV  steroids. - anticipate, he will be on the vent post op  CAD - check troponin post op  H/O PE, unprovoked - He is not on anticoagulation since he could not afford it. - while admitted, we can start him on heparin drip if OK with surgery.   LUL spiculated lesion, 4 cm (seen in 08/2016)  + Liver lesion, concerning for CA with mets - Family is aware of possibility of lung cancer. Holding off on biopsy secondary to severe comorbidities.   Family :Family updated at length today.     Monica Becton, MD 02/10/2017, 2:16 PM [facility] Pager [phone] After 3 pm or if no answer, call [phone]

## 2017-02-12 NOTE — Progress Notes (Signed)
Dr. Hetty Ely made aware of decreased blood pressures and increased agitation with titration of precedex. CVP ordered. CVP 14. Will continue to monitor.

## 2017-02-12 NOTE — Consult Note (Addendum)
Reason for Consult:Free air on renal CT scan of the abdomen Referring Physician: [name] is an 81 y.o. male.  HPI: Patient starting getting sick yesterday ab out 7:30 PM with LUQ abdominal  Pain, at that time excruciating.  No nausea of vomiting,.  Lots of significant medical  Problem including oxygen dependent COPD and CHF.  Has a defibrillator in place.  Apparently takes Eliquis  Past Medical History:  Diagnosis Date  . AAA (abdominal aortic aneurysm) ([place])    a. CT 07/2015 - mild aneurysmal dilatation of the distal abdominal aorta measuring 3.2 x 3.4 cm.  Marland Kitchen CAD (coronary artery disease)    a. 07/2011 Anterior apical STEMI/Cath/PCI: LM nl, LAD 100p/m (2.5 x 83m Mini-Vision BMS), LCX 40p, RCA dominant, nl, EF 30%.  . Chronic bronchitis   . Chronic respiratory failure ([place]   . COPD (chronic obstructive pulmonary disease) ([place]   . Dil[place] of [place] aorta ([place]    a. CT 07/2015 - aneurysmal dilatation of the descending thoracic aorta measuring 3.9 cm in greatest diameter.  . Duodenal perforation ([place] June 2012  . GERD (gastroesophageal reflux disease)   . History of DVT (deep vein thrombosis)   . Hypertension   . Ischemic cardiomyopathy    a. 01/2012 S/P MDT Protecta single lead ICD, ser # [id number]  . Myocardial infarction   . Peritonitis ([facility] June 2012  . Pneumonia April 2012  . Polycythemia vera(238.4)    a. Used to receive chronic phlebotomies until 2007, [facility]  will restart his phlebotomies from about Mar 15 2011  . Popliteal aneurysm ([place]   . Pulmonary embolism ([place]    a. Diagnosed 07/2015 - placed on Eliquis.  . Shortness of breath   . Stomach ulcer   . Systolic CHF, chronic ([place]    a. 12/2011 Echo: EF 25-30%, mid-dist antsept/inf, apical AK, Gr 1 DD, Triv AI, Mild MR.  . Tobacco abuse    a. cigars    Past Surgical History:  Procedure Laterality Date  . CARDIAC CATHETERIZATION  Sept 2012   Normal left main, occluded LAD, 40%  LCX and normal RCA. EF is 30%  . CARDIAC DEFIBRILLATOR PLACEMENT     single chamber  . CHOLECYSTECTOMY  03/2011   Dr. TMarlou Starks . COLON SURGERY    . IMPLANTABLE CARDIOVERTER DEFIBRILLATOR IMPLANT N/A 01/11/2012   Procedure: IMPLANTABLE CARDIOVERTER DEFIBRILLATOR IMPLANT;  Surgeon: SDeboraha Sprang MD;  [facility];  Service: Cardiovascular;  Laterality: N/A; [employer]  . PERIPHERAL VASCULAR CATHETERIZATION N/A 08/05/2015   Procedure: Lower Extremity Angiography;  Surgeon: VSerafina Mitchell MD;  [facility];  Service: Cardiovascular;  Laterality: N/A;  . SHOULDER ARTHROSCOPY     left, rotatotor cuff tendinopathy  . [place]  Sept 2012   EF 25 to 30% with akinesis of the mid to distal anterior and apical myocardium, trivial AI and no apical thrombus    Family History  Problem Relation Age of Onset  . Lung disease Father     [place] Sanders [place] at a [place]  reports that he quit smoking about 3 years ago. His smoking use included Cigars. He has a 30.00 pack-year smoking history. His smokeless tobacco use includes Chew. He reports that he drinks alcohol. He reports that he does not use drugs.  Allergies: No Known Allergies  Medications: I have reviewed the patient's current medications.  Results for orders placed or performed  during the hospital encounter of 02/13/2017 (from the past 48 hour(s))  Comprehensive metabolic panel     Status: Abnormal   Collection Time: 02/28/2017  8:30 PM  Result Value Ref Range   Sodium 140 135 - 145 mmol/L   Potassium 5.2 (H) 3.5 - 5.1 mmol/L   Chloride 104 101 - 111 mmol/L   CO2 31 22 - 32 mmol/L   Glucose, Bld 108 (H) 65 - 99 mg/dL   BUN 20 6 - 20 mg/dL   Creatinine, Ser 1.24 0.61 - 1.24 mg/dL   Calcium 9.0 8.9 - 10.3 mg/dL   Total Protein 6.3 (L) 6.5 - 8.1 g/dL   Albumin 3.1 (L) 3.5 - 5.0 g/dL   AST 22 15 - 41 U/L   ALT 9 (L) 17 - 63 U/L   Alkaline Phosphatase 83 38 - 126 U/L   Total Bilirubin  1.3 (H) 0.3 - 1.2 mg/dL   GFR calc non Af Amer 54 (L) >60 mL/min   GFR calc Af Amer >60 >60 mL/min    Comment: (NOTE) The eGFR has been calculated using the CKD EPI equation. This calculation has not been validated in all clinical situations. eGFR's persistently <60 mL/min signify possible Chronic Kidney Disease.    Anion gap 5 5 - 15  Lipase, blood     Status: None   Collection Time: 02/14/2017  8:30 PM  Result Value Ref Range   Lipase 16 11 - 51 U/L  CBC with Differential     Status: Abnormal   Collection Time: 02/14/2017  8:30 PM  Result Value Ref Range   WBC 7.5 4.0 - 10.5 K/uL   RBC 5.79 4.22 - 5.81 MIL/uL   Hemoglobin 16.1 13.0 - 17.0 g/dL   HCT 51.0 39.0 - 52.0 %   MCV 88.1 78.0 - 100.0 fL   MCH 27.8 26.0 - 34.0 pg   MCHC 31.6 30.0 - 36.0 g/dL   RDW 15.7 (H) 11.5 - 15.5 %   Platelets 129 (L) 150 - 400 K/uL   Neutrophils Relative % 77 %   Neutro Abs 5.8 1.7 - 7.7 K/uL   Lymphocytes Relative 14 %   Lymphs Abs 1.1 0.7 - 4.0 K/uL   Monocytes Relative 7 %   Monocytes Absolute 0.5 0.1 - 1.0 K/uL   Eosinophils Relative 2 %   Eosinophils Absolute 0.2 0.0 - 0.7 K/uL   Basophils Relative 0 %   Basophils Absolute 0.0 0.0 - 0.1 K/uL  Urinalysis, Routine w reflex microscopic     Status: Abnormal   Collection Time: 03/01/2017  8:48 PM  Result Value Ref Range   Color, Urine YELLOW YELLOW   APPearance CLEAR CLEAR   Specific Gravity, Urine 1.021 1.005 - 1.030   pH 5.0 5.0 - 8.0   Glucose, UA NEGATIVE NEGATIVE mg/dL   Hgb urine dipstick LARGE (A) NEGATIVE   Bilirubin Urine NEGATIVE NEGATIVE   Ketones, ur NEGATIVE NEGATIVE mg/dL   Protein, ur 30 (A) NEGATIVE mg/dL   Nitrite NEGATIVE NEGATIVE   Leukocytes, UA NEGATIVE NEGATIVE   RBC / HPF 6-30 0 - 5 RBC/hpf   WBC, UA 0-5 0 - 5 WBC/hpf   Bacteria, UA RARE (A) NONE SEEN   Squamous Epithelial / LPF 0-5 (A) NONE SEEN   Mucous PRESENT   Brain natriuretic peptide     Status: Abnormal   Collection Time: 02/14/2017 10:56 PM  Result Value  Ref Range   B Natriuretic Peptide 107.5 (H) 0.0 - 100.0 pg/mL  I-stat troponin, ED     Status: None   Collection Time: 02/22/2017 11:27 PM  Result Value Ref Range   Troponin i, poc 0.00 0.00 - 0.08 ng/mL   Comment 3            Comment: Due to the release kinetics of cTnI, a negative result within the first hours of the onset of symptoms does not rule out myocardial infarction with certainty. If myocardial infarction is still suspected, repeat the test at appropriate intervals.     Dg Chest Port 1 View  Result Date: 03/06/2017 CLINICAL DATA:  Left lower quadrant pain, onset today.  Dyspnea. EXAM: PORTABLE CHEST 1 VIEW COMPARISON:  09/20/2016, 12/12/2016. FINDINGS: Spiculated left upper lobe mass again evident. Patchy opacity in the lateral left base could represent a infectious infiltrate. This is new. Emphysematous and fibrotic appearing changes persist. Unchanged mild cardiomegaly. IMPRESSION: Patchy opacity in the lateral left base could represent pneumonia. Hyperinflation and cardiomegaly are unchanged. Persistent left upper lobe spiculated mass. Electronically Signed   By: Andreas Newport M.D.   On: 02/05/2017 23:17   Ct Renal Stone Study  Result Date: 02/15/2017 CLINICAL DATA:  81 y/o M; left lower quadrant abdominal pain. History of stomach ulcer, GERD, duodenum perforation, abdominal aortic aneurysm, colon surgery, cholecystectomy, and lung mass. EXAM: CT ABDOMEN AND PELVIS WITHOUT CONTRAST TECHNIQUE: Multidetector CT imaging of the abdomen and pelvis was performed following the standard protocol without IV contrast. COMPARISON:  09/20/2016 PET-CT.  06/22/2011 CT abdomen and pelvis. FINDINGS: Lower chest: Stable right lower lobe atelectasis and round atelectasis. Calcific atherosclerosis of the aortic valve. AICD lead noted within the right ventricle. Right calcific pleural thickening posteriorly. Hepatobiliary: Ill-defined segment 4B hypoattenuating lesion measuring 16 mm new from prior  studies (series 3: Image 24). Pancreas: Unremarkable. No pancreatic ductal dilatation or surrounding inflammatory changes. Spleen: Normal in size without focal abnormality. Adrenals/Urinary Tract: 27 x 25 mm left adrenal mass. Multiple well-circumscribed fluid attenuating foci within the kidneys bilaterally compatible with cysts the largest in the left interpolar region measuring 22 mm. There is an isodense exophytic structure of the left kidney interpolar region measuring 10 mm (series 3, image 36) probably representing a hemorrhagic cyst that is stable. No hydronephrosis. Normal bladder. Stomach/Bowel: Postsurgical changes of proximal duodenum. Patent loop gastrojejunostomy. There are inflammatory changes of fat surrounding loops of wall thickened jejunum in the left upper quadrant extending to the gastrojejunal junction and there is a small volume of pneumoperitoneum. No evidence for bowel obstruction. Vascular/Lymphatic: Aortic atherosclerosis. No enlarged abdominal or pelvic lymph nodes. 3.3 cm infrarenal abdominal aortic aneurysm. Reproductive: Moderate prostate enlargement. Other: No abdominal wall hernia or abnormality. No abdominopelvic ascites. Musculoskeletal: Advanced cervical spondylosis greatest at the L5-S1 level with there is severe disc space narrowing and lower lumbar facet arthrosis. No acute osseous abnormality identified. Mild bilateral hip osteoarthrosis. IMPRESSION: 1. Inflammatory changes surrounding loops of thickened jejunum near the gastrojejunostomy may be infectious, inflammatory, or related to ischemia. Small volume of pneumoperitoneum indicates perforation. 2. New ill-defined liver segment 4B hypodense lesion measuring 16 mm, possibly metastasis. 3. Right posterior lower lobe calcified pleural plaque and round atelectasis is stable. 4. 3.3 cm stable infrarenal abdominal aortic aneurysm. Recommend followup by ultrasound in 3 years. This recommendation follows ACR consensus guidelines:  White Paper of the ACR Incidental Findings Committee II on Vascular Findings. J Am Coll Radiol 2013; 10:789-794. 5. Moderate prostate enlargement. 6. Stable left adrenal adenoma. These results were called by telephone at the time of  interpretation on 02/27/2017 at 1:06 am to Dr. Waynetta Pean , who verbally acknowledged these results. Electronically Signed   By: Kristine Garbe M.D.   On: 02/21/2017 01:06    Review of Systems  Constitutional: Negative for chills and fever.  Gastrointestinal: Positive for abdominal pain.       Poor appetite   Blood pressure 139/82, pulse 99, temperature 97.6 F (36.4 C), temperature source Oral, resp. rate (!) 27, height 6' (1.829 m), weight 99.8 kg (220 lb), SpO2 98 %. Physical Exam  Constitutional: He is oriented to person, place, and time.  Neck: Normal range of motion.  Respiratory: Accessory muscle usage present. Tachypnea noted. He is in respiratory distress. He has decreased breath sounds in the right lower field and the left lower field. He has no wheezes.  GI: Soft. Bowel sounds are normal. He exhibits distension. There is tenderness in the left upper quadrant. There is guarding (LUQ only). There is no rigidity.  Musculoskeletal: Normal range of motion.  Neurological: He is alert and oriented to person, place, and time.  Skin: Skin is warm. Bruising, ecchymosis and laceration (skin tears) noted.  Psychiatric: His mood appears anxious.    Assessment/Plan: Patient is not on steroids.  His examination is fairly benign, but he does have free air on CT.  This was done without contrast because the were looking for a stone.  She needs a CT with oral only to delineate anatomy of the distal stomach and determine if there is leakage of contrast  Oceanna Arruda 03/03/2017, 2:41 AM    I reviewed the repeat CT scan of the abdomen with oral contrast with the radiologist and it shows an ulcer near a previous suture line of a gastrojejunostomy.  The contrast  leaks towards the area of free air, but not into the cavity.  The family is unaware of any previous gastric operation, but it appears to be a BII type gastrojejunostomy along the greater curvature of the stomach.  The patient has significant medical problems including COPD and CHF.  Last EF I have seen documented is 25%.  He has an AICD in place on the left side.  However, he no longer takes Eliquis because it was too expensive.  He will need exploration for perforated ulcer today.Kathryne Eriksson Dahlia Bailiff, MD, [place] [phone] [facility]

## 2017-02-12 NOTE — ED Notes (Signed)
Patient transported to CT SCAN . 

## 2017-02-12 NOTE — ED Notes (Signed)
Paged critical care.

## 2017-02-12 NOTE — Anesthesia Preprocedure Evaluation (Addendum)
Anesthesia Evaluation  Patient identified by MRN, date of birth, ID band Patient awake    Reviewed: Allergy & Precautions, NPO status , Patient's Chart, lab work & pertinent test results, reviewed documented beta blocker date and time   Airway Mallampati: III  TM Distance: >3 FB Neck ROM: Full    Dental  (+) Poor Dentition   Pulmonary shortness of breath, pneumonia, COPD, former smoker,    + rhonchi  + decreased breath sounds+ wheezing  (-) rales    Cardiovascular hypertension, Pt. on medications and Pt. on home beta blockers + CAD, + Past MI, + Peripheral Vascular Disease and +CHF  + Cardiac Defibrillator  Rhythm:Regular Rate:Tachycardia  AICD interrogation 12/2016 Medtronic Protecta XT VR. VS >99.9%. Backup rate 40 VVI. Does have runs of VT to 150s-190s lasting ~45sec.   Neuro/Psych negative neurological ROS  negative psych ROS   GI/Hepatic Neg liver ROS, PUD, GERD  ,  Endo/Other  negative endocrine ROS  Renal/GU negative Renal ROS     Musculoskeletal negative musculoskeletal ROS (+)   Abdominal   Peds  Hematology negative hematology ROS (+)   Anesthesia Other Findings   Reproductive/Obstetrics negative OB ROS                            Anesthesia Physical Anesthesia Plan  ASA: IV and emergent  Anesthesia Plan: General   Post-op Pain Management:    Induction: Intravenous, Rapid sequence and Cricoid pressure planned  Airway Management Planned: Oral ETT  Additional Equipment: Arterial line, CVP and Ultrasound Guidance Line Placement  Intra-op Plan:   Post-operative Plan: Post-operative intubation/ventilation  Informed Consent: I have reviewed the patients History and Physical, chart, labs and discussed the procedure including the risks, benefits and alternatives for the proposed anesthesia with the patient or authorized representative who has indicated his/her understanding and  acceptance.   Dental advisory given  Plan Discussed with: CRNA  Anesthesia Plan Comments:        Anesthesia Quick Evaluation

## 2017-02-12 NOTE — Anesthesia Procedure Notes (Signed)
Procedure Name: Intubation Date/Time: 03/05/2017 2:16 PM Performed by: Nolon Nations Pre-anesthesia Checklist: Patient identified, Emergency Drugs available, Suction available and Patient being monitored Patient Re-evaluated:Patient Re-evaluated prior to inductionOxygen Delivery Method: Circle system utilized Preoxygenation: Pre-oxygenation with 100% oxygen Intubation Type: IV induction, Rapid sequence and Cricoid Pressure applied Laryngoscope Size: Mac and 4 Grade View: Grade I Tube type: Subglottic suction tube Tube size: 7.5 mm Number of attempts: 1 Airway Equipment and Method: Stylet Placement Confirmation: ETT inserted through vocal cords under direct vision,  positive ETCO2 and breath sounds checked- equal and bilateral Secured at: 22 cm Tube secured with: Tape Dental Injury: Teeth and Oropharynx as per pre-operative assessment

## 2017-02-13 ENCOUNTER — Encounter (HOSPITAL_COMMUNITY): Payer: Self-pay | Admitting: Surgery

## 2017-02-13 DIAGNOSIS — J95821 Acute postprocedural respiratory failure: Secondary | ICD-10-CM

## 2017-02-13 DIAGNOSIS — N179 Acute kidney failure, unspecified: Secondary | ICD-10-CM

## 2017-02-13 DIAGNOSIS — I255 Ischemic cardiomyopathy: Secondary | ICD-10-CM

## 2017-02-13 DIAGNOSIS — L8990 Pressure ulcer of unspecified site, unspecified stage: Secondary | ICD-10-CM | POA: Insufficient documentation

## 2017-02-13 DIAGNOSIS — I2510 Atherosclerotic heart disease of native coronary artery without angina pectoris: Secondary | ICD-10-CM

## 2017-02-13 LAB — BASIC METABOLIC PANEL WITH GFR
Anion gap: 11 (ref 5–15)
BUN: 40 mg/dL — ABNORMAL HIGH (ref 6–20)
CO2: 20 mmol/L — ABNORMAL LOW (ref 22–32)
Calcium: 8 mg/dL — ABNORMAL LOW (ref 8.9–10.3)
Chloride: 112 mmol/L — ABNORMAL HIGH (ref 101–111)
Creatinine, Ser: 3.07 mg/dL — ABNORMAL HIGH (ref 0.61–1.24)
GFR calc Af Amer: 21 mL/min — ABNORMAL LOW (ref >60)
GFR calc non Af Amer: 18 mL/min — ABNORMAL LOW (ref >60)
Glucose, Bld: 123 mg/dL — ABNORMAL HIGH (ref 65–99)
Potassium: 4.7 mmol/L (ref 3.5–5.1)
Sodium: 143 mmol/L (ref 135–145)

## 2017-02-13 LAB — COMPREHENSIVE METABOLIC PANEL
ALBUMIN: 2.3 g/dL — AB (ref 3.5–5.0)
ALT: 33 U/L (ref 17–63)
ANION GAP: 7 (ref 5–15)
AST: 36 U/L (ref 15–41)
Alkaline Phosphatase: 45 U/L (ref 38–126)
BUN: 35 mg/dL — ABNORMAL HIGH (ref 6–20)
CALCIUM: 7.6 mg/dL — AB (ref 8.9–10.3)
CO2: 22 mmol/L (ref 22–32)
Chloride: 110 mmol/L (ref 101–111)
Creatinine, Ser: 2.61 mg/dL — ABNORMAL HIGH (ref 0.61–1.24)
GFR calc non Af Amer: 22 mL/min — ABNORMAL LOW (ref >60)
GFR, EST AFRICAN AMERICAN: 25 mL/min — AB (ref >60)
GLUCOSE: 161 mg/dL — AB (ref 65–99)
POTASSIUM: 4.5 mmol/L (ref 3.5–5.1)
SODIUM: 139 mmol/L (ref 135–145)
TOTAL PROTEIN: 4.6 g/dL — AB (ref 6.5–8.1)
Total Bilirubin: 1.1 mg/dL (ref 0.3–1.2)

## 2017-02-13 LAB — CBC
HCT: 47.8 % (ref 39.0–52.0)
Hemoglobin: 15 g/dL (ref 13.0–17.0)
MCH: 27.8 pg (ref 26.0–34.0)
MCHC: 31.4 g/dL (ref 30.0–36.0)
MCV: 88.5 fL (ref 78.0–100.0)
PLATELETS: 79 10*3/uL — AB (ref 150–400)
RBC: 5.4 MIL/uL (ref 4.22–5.81)
RDW: 16.6 % — ABNORMAL HIGH (ref 11.5–15.5)
WBC: 10.7 10*3/uL — ABNORMAL HIGH (ref 4.0–10.5)

## 2017-02-13 LAB — CREATININE, URINE, RANDOM: Creatinine, Urine: 159.22 mg/dL

## 2017-02-13 LAB — LACTIC ACID, PLASMA: Lactic Acid, Venous: 1.1 mmol/L (ref 0.5–1.9)

## 2017-02-13 LAB — SODIUM, URINE, RANDOM: SODIUM UR: 35 mmol/L

## 2017-02-13 MED ORDER — AMIODARONE HCL IN DEXTROSE 360-4.14 MG/200ML-% IV SOLN
INTRAVENOUS | Status: AC
  Administered 2017-02-13: 60 mg/h
  Filled 2017-02-13: qty 200

## 2017-02-13 MED ORDER — AMIODARONE HCL IN DEXTROSE 360-4.14 MG/200ML-% IV SOLN
60.0000 mg/h | INTRAVENOUS | Status: DC
  Administered 2017-02-13: 60 mg/h via INTRAVENOUS
  Administered 2017-02-14 (×2): 30 mg/h via INTRAVENOUS
  Administered 2017-02-15: 60 mg/h via INTRAVENOUS
  Administered 2017-02-15: 30 mg/h via INTRAVENOUS
  Administered 2017-02-15: 60 mg/h via INTRAVENOUS
  Administered 2017-02-16: 30 mg/h via INTRAVENOUS
  Administered 2017-02-16 – 2017-02-19 (×5): 60 mg/h via INTRAVENOUS
  Administered 2017-02-19: 30 mg/h via INTRAVENOUS
  Filled 2017-02-13 (×31): qty 200

## 2017-02-13 MED ORDER — AMIODARONE HCL IN DEXTROSE 360-4.14 MG/200ML-% IV SOLN
INTRAVENOUS | Status: AC
  Administered 2017-02-13: 60 mg/h via INTRAVENOUS
  Filled 2017-02-13: qty 200

## 2017-02-13 MED ORDER — HEPARIN SODIUM (PORCINE) 5000 UNIT/ML IJ SOLN
5000.0000 [IU] | Freq: Three times a day (TID) | INTRAMUSCULAR | Status: DC
  Administered 2017-02-13 – 2017-02-24 (×33): 5000 [IU] via SUBCUTANEOUS
  Filled 2017-02-13 (×35): qty 1

## 2017-02-13 MED ORDER — SODIUM CHLORIDE 0.9 % IV BOLUS (SEPSIS)
500.0000 mL | Freq: Once | INTRAVENOUS | Status: AC
  Administered 2017-02-13: 500 mL via INTRAVENOUS

## 2017-02-13 MED ORDER — FUROSEMIDE 10 MG/ML IJ SOLN
INTRAMUSCULAR | Status: AC
  Administered 2017-02-13: 40 mg
  Filled 2017-02-13: qty 8

## 2017-02-13 MED ORDER — SODIUM CHLORIDE 0.9 % IV BOLUS (SEPSIS)
500.0000 mL | Freq: Once | INTRAVENOUS | Status: AC
Start: 2017-02-13 — End: 2017-02-13
  Administered 2017-02-13: 500 mL via INTRAVENOUS

## 2017-02-13 MED ORDER — MIDAZOLAM HCL 2 MG/2ML IJ SOLN
1.0000 mg | INTRAMUSCULAR | Status: DC | PRN
  Administered 2017-02-13: 1 mg via INTRAVENOUS
  Filled 2017-02-13: qty 2

## 2017-02-13 MED ORDER — FLUCONAZOLE IN SODIUM CHLORIDE 200-0.9 MG/100ML-% IV SOLN
200.0000 mg | INTRAVENOUS | Status: DC
  Administered 2017-02-13: 200 mg via INTRAVENOUS
  Filled 2017-02-13 (×2): qty 100

## 2017-02-13 MED ORDER — AMIODARONE HCL IN DEXTROSE 360-4.14 MG/200ML-% IV SOLN
60.0000 mg/h | INTRAVENOUS | Status: AC
  Administered 2017-02-13: 60 mg/h via INTRAVENOUS

## 2017-02-13 MED ORDER — SODIUM CHLORIDE 0.9 % IV SOLN
0.0000 ug/min | INTRAVENOUS | Status: DC
  Administered 2017-02-15: 30 ug/min via INTRAVENOUS
  Administered 2017-02-15: 20 ug/min via INTRAVENOUS
  Filled 2017-02-13 (×3): qty 1

## 2017-02-13 MED ORDER — MIDAZOLAM HCL 2 MG/2ML IJ SOLN
1.0000 mg | INTRAMUSCULAR | Status: DC | PRN
  Administered 2017-02-13 (×2): 1 mg via INTRAVENOUS
  Filled 2017-02-13 (×2): qty 2

## 2017-02-13 MED ORDER — METOPROLOL TARTRATE 5 MG/5ML IV SOLN
2.5000 mg | Freq: Four times a day (QID) | INTRAVENOUS | Status: DC
  Administered 2017-02-13 – 2017-02-14 (×3): 2.5 mg via INTRAVENOUS
  Filled 2017-02-13 (×8): qty 5

## 2017-02-13 MED ORDER — SODIUM CHLORIDE 0.9% FLUSH: 10.0000 mL | INTRAVENOUS | Status: DC | PRN

## 2017-02-13 MED ORDER — AMIODARONE IV BOLUS ONLY 150 MG/100ML
150.0000 mg | Freq: Once | INTRAVENOUS | Status: AC
  Administered 2017-02-13: 150 mg via INTRAVENOUS

## 2017-02-13 MED ORDER — CHLORHEXIDINE GLUCONATE CLOTH 2 % EX PADS
6.0000 | MEDICATED_PAD | Freq: Every day | CUTANEOUS | Status: DC
  Administered 2017-02-13 – 2017-02-19 (×7): 6 via TOPICAL

## 2017-02-13 MED ORDER — FENTANYL CITRATE (PF) 100 MCG/2ML IJ SOLN
25.0000 ug | INTRAMUSCULAR | Status: DC | PRN
  Administered 2017-02-13 (×3): 50 ug via INTRAVENOUS
  Filled 2017-02-13 (×3): qty 2

## 2017-02-13 MED ORDER — SODIUM CHLORIDE 0.9% FLUSH
10.0000 mL | Freq: Two times a day (BID) | INTRAVENOUS | Status: DC
  Administered 2017-02-13 (×2): 10 mL
  Administered 2017-02-15: 20 mL
  Administered 2017-02-16 – 2017-02-24 (×14): 10 mL

## 2017-02-13 MED ORDER — FENTANYL CITRATE (PF) 100 MCG/2ML IJ SOLN
25.0000 ug | INTRAMUSCULAR | Status: DC | PRN
  Administered 2017-02-13: 50 ug via INTRAVENOUS
  Administered 2017-02-13 – 2017-02-16 (×4): 75 ug via INTRAVENOUS
  Filled 2017-02-13 (×2): qty 2

## 2017-02-13 MED ORDER — FUROSEMIDE 10 MG/ML IJ SOLN: 80.0000 mg | Freq: Once | INTRAMUSCULAR | Status: AC

## 2017-02-13 NOTE — Progress Notes (Signed)
eLink Physician-Brief Progress Note Patient Name: Jesus Sanders DOB: [birth year] MRN: [id number]   Date of Service  02/13/2017  HPI/Events of Note  Wide complex tachy - baseline RBBB on EKG  eICU Interventions  Amio 150 bolus - slowed down to 120s Lasix 80 IV  Stat BMET     Intervention Category Major Interventions: Arrhythmia - evaluation and management  Lexie Morini V. 02/13/2017, 6:55 PM

## 2017-02-13 NOTE — Progress Notes (Signed)
Patient has ongoing agitation this evening. We have tried precedex and fentanyl for this but his blood pressure drops from 130s with aggitation to the 80s after either of these medications have been given. He did loose blood and abdominal fluid during ex lap this afternoon. He has received 1 liter of NS and has CVP 12-14 after this bolus. He tells his nurse he is not in pain. On exam uhe is on the precedex and is not agitated, he has SBP 93 with vitals otherwise stable, no peripheral edema, warm extremities, lungs clear to auscultation.  Spoke with the pharmacy regarding trying a different medication for sedation, they feel that fentanyl, precedex, and benzos all have an equal but lower chance of causing hypotension.   Ordered 500 cc IV fluid bolus  Trial of versed for sedation If his blood pressure continues to drop we will need to consider a vasoactive agent

## 2017-02-13 NOTE — Progress Notes (Addendum)
PULMONARY  / CRITICAL CARE MEDICINE  Name: Jesus Sanders MRN: [id number] DOB: 12-27-1935    LOS: 30  REFERRING MD :  Dr. Nehemiah Settle  CHIEF COMPLAINT:  Abdominal pain  HPI: 81yo male with PMH of COPD on chronic oxygen supplementation (2-3L continuously per [place]), CHR (echo 10/17 EF 45-50%, G1DD), CAD, polycythemia vera, recent PE not on anticoagulation due to cost, HTN, presenting to [facility] with abdominal pain.   Initial ER evaluation notable for Na 140, K 5.2, Sr Cr 1.24, albumin 3.1, BNP 107, troponin 0, WBC 7.5, hgb 16.1 and platelets 129.  CT Renal study completed which showed inflammatory changes surrounding the loops of thickened jejunum near the gastrojejunostomy, small volume of pneumoperitoneum, new ill defined liver segment with hypodense lesion (16 mm), R posterior lobe calcified pleural plaque, 3.3 cm stable infrarenal abdominal aortic aneurysm.  Repeat CT with enteric contrast showed patency of the gastrojejunostomy but revealed a peri-anastomotic ulcer of the jejunum which directly communicated with the pneumoperitoneum.  The patient's blood pressure was intermittently low and he was treated with 1500 ml NS with improvement.  He was covered with vancomycin + zosyn.  CCS consulted and patient underwent surgery 4/8.  CXR concerning for LLL atelectasis vs infiltrate.  LINES / TUBES: ETT 4/8>>4/9 CVC 4/8>> A line 4/8>>  CULTURES: BCx x 2 4/8>>  ANTIBIOTICS: Vanco 4/8 >>4/9  Zosyn 4/8 >>  Diflucan 4/8 >>   SIGNIFICANT EVENTS:  4/08  Admit with abdominal pain, found to have a perforated gastric ulcer - surgery for patching 4/9 Extubation   PAST MEDICAL HISTORY :  Past Medical History:  Diagnosis Date  . AAA (abdominal aortic aneurysm) ([place])    a. CT 07/2015 - mild aneurysmal dilatation of the distal abdominal aorta measuring 3.2 x 3.4 cm.  Marland Kitchen CAD (coronary artery disease)    a. 07/2011 Anterior apical STEMI/Cath/PCI: LM nl, LAD 100p/m (2.5 x 66m Mini-Vision BMS), LCX 40p, RCA  dominant, nl, EF 30%.  . Chronic bronchitis   . Chronic respiratory failure ([place]   . COPD (chronic obstructive pulmonary disease) ([place]   . Dilatation of thoracic aorta ([place]    a. CT 07/2015 - aneurysmal dilatation of the descending thoracic aorta measuring 3.9 cm in greatest diameter.  . Duodenal perforation ([facility] June 2012  . GERD (gastroesophageal reflux disease)   . History of DVT (deep vein thrombosis)   . Hypertension   . Ischemic cardiomyopathy    a. 01/2012 S/P MDT Protecta single lead ICD, ser # [id number]  . Myocardial infarction   . Peritonitis ([facility] June 2012  . Pneumonia April 2012  . Polycythemia vera(238.4)    a. [facility]  will restart his phlebotomies from about Mar 15 2011  . Popliteal aneurysm ([place]   . Pulmonary embolism ([place]    a. Diagnosed 07/2015 - placed on Eliquis.  . Shortness of breath   . Stomach ulcer   . Systolic CHF, chronic ([place]    a. 12/2011 Echo: EF 25-30%, mid-dist antsept/inf, apical AK, Gr 1 DD, Triv AI, Mild MR.  . Tobacco abuse    a. cigars   Past Surgical History:  Procedure Laterality Date  . CARDIAC CATHETERIZATION  Sept 2012   Normal left main, occluded LAD, 40% LCX and normal RCA. EF is 30%  . CARDIAC DEFIBRILLATOR PLACEMENT     single chamber  . CHOLECYSTECTOMY  03/2011   Dr. TMarlou Starks . COLON SURGERY    .  IMPLANTABLE CARDIOVERTER DEFIBRILLATOR IMPLANT N/A 01/11/2012   Procedure: IMPLANTABLE CARDIOVERTER DEFIBRILLATOR IMPLANT;  Surgeon: Deboraha Sprang, MD;  Location: [facility] C[facility] LAB;  Service: Cardiovascular;  Laterality: N/A; [employer]  . PERIPHERAL VASCULAR CATHETERIZATION N/A 08/05/2015   Procedure: Lower Extremity Angiography;  Surgeon: Serafina Mitchell, MD;  Location: [facility] CV LAB;  Service: Cardiovascular;  Laterality: N/A;  . SHOULDER ARTHROSCOPY     left, rotatotor cuff tendinopathy  . US ECHOCARDIOGRAPHY  Sept 2012   EF 25 to 30% with akinesis of the mid to  distal anterior and apical myocardium, trivial AI and no apical thrombus   Prior to Admission medications   Medication Sig Start Date End Date Taking? Authorizing Provider  aspirin EC 81 MG EC tablet Take 1 tablet (81 mg total) by mouth daily. 09/02/16  Yes Eloise Levels, MD  BROVANA 15 MCG/2ML NEBU USE ONE VIAL IN NEBULIZER TWICE DAILY 09/20/16  Yes Tanda Rockers, MD  budesonide (PULMICORT) 0.25 MG/2ML nebulizer solution USE ONE VIAL IN NEBULIZER TWICE DAILY 08/04/15  Yes Tanda Rockers, MD  furosemide (LASIX) 40 MG tablet Take 20 mg by mouth daily.    Yes Historical Provider, MD  lovastatin (MEVACOR) 20 MG tablet Take 20 mg by mouth at bedtime.   Yes Historical Provider, MD  metoprolol succinate (TOPROL-XL) 100 MG 24 hr tablet Take 1 tablet (100 mg total) by mouth 2 (two) times daily. 12/12/16  Yes Deboraha Sprang, MD  ranitidine (ZANTAC) 150 MG tablet Take 150 mg by mouth daily.   Yes Historical Provider, MD   No Known Allergies  FAMILY HISTORY:  Family History  Problem Relation Age of Onset  . Lung disease Father     [name]- worked at a [employer]:  reports that he quit smoking about 3 years ago. His smoking use included Cigars. He has a 30.00 pack-year smoking history. His smokeless tobacco use includes Chew. He reports that he drinks alcohol. He reports that he does not use drugs.  REVIEW OF SYSTEMS:  Patient denies abdominal pain but is tender on exam.   INTERVAL HISTORY:   VITAL SIGNS: Temp:  [97.4 F (36.3 C)-98.3 F (36.8 C)] 98.3 F (36.8 C) (04/09 0400) Pulse Rate:  [70-107] 91 (04/09 0700) Resp:  [16-31] 16 (04/09 0700) BP: (74-150)/(49-95) 80/51 (04/09 0700) SpO2:  [94 %-100 %] 99 % (04/09 0700) Arterial Line BP: (93-194)/(54-113) 93/54 (04/08 1842) FiO2 (%):  [40 %-100 %] 40 % (04/09 0254) HEMODYNAMICS:   VENTILATOR SETTINGS: Vent Mode: PRVC FiO2 (%):  [40 %-100 %] 40 % Set Rate:  [16 bmp] 16 bmp Vt Set:  [620 mL] 620 mL PEEP:  [5  cmH20] 5 cmH20 Plateau Pressure:  [17 cmH20-22 cmH20] 21 cmH20 INTAKE / OUTPUT: Intake/Output      04/08 0701 - 04/09 0700 04/09 0701 - 04/10 0700   I.V. (mL/kg) 3386.8 (33.9)    IV Piggyback 750    Total Intake(mL/kg) 4136.8 (41.5)    Urine (mL/kg/hr) 290 (0.1)    Drains 340 (0.1)    Blood 100 (0)    Total Output 730     Net +3406.8            PHYSICAL EXAMINATION: General:  NAD pre and post extubation Neuro:  Alert, following commands and answering questions appropriately HEENT:  ETT tube in place Cardiovascular:  RRR, pulses intact, no murmurs, rubs or gallops, no LE edema Lungs:  CTAB, no increased work of breathing,  no rales or rhonchi; no stridor post-extubation Abdomen:  Clean, dry dressing in place, tenderness to palpation in LUQ/Epigastrium. Left and right sided drains with serosanguinous fluid.  Musculoskeletal:  ROM intact Skin:  intact   LABS: Cbc  Recent Labs Lab 03/04/2017 2030 02/19/2017 1518 02/13/17 0350  WBC 7.5  --  10.7*  HGB 16.1 17.0 15.0  HCT 51.0 50.0 47.8  PLT 129*  --  79*    Chemistry   Recent Labs Lab 02/09/2017 2030 02/20/2017 1518 02/13/17 0350  NA 140 139 139  K 5.2* 5.4* 4.5  CL 104  --  110  CO2 31  --  22  BUN 20  --  35*  CREATININE 1.24  --  2.61*  CALCIUM 9.0  --  7.6*  GLUCOSE 108*  --  161*    Liver fxn  Recent Labs Lab 02/13/2017 2030 02/13/17 0350  AST 22 36  ALT 9* 33  ALKPHOS 83 45  BILITOT 1.3* 1.1  PROT 6.3* 4.6*  ALBUMIN 3.1* 2.3*   coags No results for input(s): APTT, INR in the last 168 hours. Sepsis markers  Recent Labs Lab 03/06/2017 0323 02/05/2017 0624  LATICACIDVEN 2.4* 2.5*   Cardiac markers No results for input(s): CKTOTAL, CKMB, TROPONINI in the last 168 hours. BNP No results for input(s): PROBNP in the last 168 hours. ABG  Recent Labs Lab 03/04/2017 1518  PHART 7.263*  PCO2ART 48.2*  PO2ART 104.0  HCO3 22.1  TCO2 24    CBG trend  Recent Labs Lab 02/16/2017 1921  GLUCAP 105*     IMAGING: CT Renal Study 4/8 >> inflammatory changes surrounding the loops of thickened jejunum near the gastrojejunostomy, small volume of pneumoperitoneum, new ill defined liver segment with hypodense lesion (16 mm), R posterior lobe calcified pleural plaque, 3.3 cm stable infrarenal abdominal aortic aneurysm.   CT Abd with enteric contrast 4/8 >> showed patency of the gastrojejunostomy but revealed a peri-anastomotic ulcer of the jejunum which directly communicated with the pneumoperitoneum.   DIAGNOSES: Principal Problem:   Bowel perforation (HCC) Active Problems:   CAD (coronary artery disease)   Chronic systolic heart failure ([place])   Essential hypertension   CAP (community acquired pneumonia)   Lactic acidosis   ASSESSMENT / PLAN:  PULMONARY A: COPD  LUL Spiculated Mass Hx PE - not on anticoagulation  Former Tobacco Abuse   P:   Brovana + Pulmicort  PRN albuterol  Pulmonary hygiene   CARDIOVASCULAR A:  Sepsis - in setting of perforated ulcer Continued borderline blood pressure; received ~4L of fluid since admission.  Hx ICM, chronic sCHF, HTN, AAA, CAD  P:  Monitor hemodynamics closely  D5NS 1105m/hr NS bolus 500cc; if no improvement in BP's, start neo for pressure support See ID   RENAL A:   AKI, with low urine output likely pre-renal from hypotension/hypovolemia P:   Continue IV fluids NS bolus 500cc with possible starting of pressors as above Trend BMP / urinary output Replace electrolytes as indicated Avoid nephrotoxic agents, ensure adequate renal perfusion - stopping vanc Hold home lasix  GASTROINTESTINAL A:   16 mm Hypodense Liver Lesion - new on CT, hx of spiculated LUL nodule, ?infectious vs malignant Hx GERD, duodenal perforation (2012) s/p  P:   NPO; NG tube in place   No plan as of now for liver biopsy   HEMATOLOGIC A:   Polycythemia Vera P:  Trend CBC  Begin sq heparin in setting of AKI  INFECTIOUS A:   Perforated  Ulcer / Peritonitis  P:   Diflucan and zosyn D/c vanc Follow cultures   ENDOCRINE A:   At Risk Hypo/Hyperglycemia   P:   Trend glucose  D5/NS 157m/hr NPO   NEUROLOGIC A:   Pain  P:   RASS goal: 0 Pain control per CCS   I have personally obtained a history, examined the patient, evaluated laboratory and imaging results, formulated the assessment and plan and placed orders. CRITICAL CARE: The patient is critically ill with multiple organ systems failure and requires high complexity decision making for assessment and support, frequent evaluation and titration of therapies, application of advanced monitoring technologies and extensive interpretation of multiple databases. Critical Care Time devoted to patient care services described in this note is 33 minutes.    GAlphonzo Grieve MD IMTS - PGY1   02/13/2017, 7:40 AM  ATTENDING NOTE: I have personally reviewed patient's available data, including medical history, events of note, physical examination and test results as part of my evaluation. I have discussed with resident/NP and other careteam providers such as pharmacist, RN and RRT & co-ordinated with consultants. In addition, I personally evaluated patient and elicited key history of  81year old man with severe COPD on home oxygen, systolic CHF and recent pulmonary embolism with a lung mass underwent emergent laparotomy for small bowel perforation He was hypotensive in spite of 4 L of fluid and developed renal failure, postop requiring mechanical ventilation.  On exam-awake, denies pain, his witnessed attack he, mildly distended abdomen with diffuse tenderness and incision sites, decreased breath sounds bilateral no rhonchi, no edema  Labs show no leukocytosis, from a cytopenia, creatinine has increased to 2.6, normal electrolytes  ABG shows acute respiratory acidosis from 4/8  Impression/plan  Postop respiratory failure-ventilator settings were reviewed, he was started  on spontaneous breathing trial, seemed to tolerate pressure support 5/5 with good tidal volumes and was extubated to nasal cannula  AKI- maybe related to hypotension, check FENa, gentle hydration, may need pressors for septic shock  Lung mass with liver lesion-deemed not a candidate for intervention in the past, Last office visit with Dr. wMelvyn Novasin 09/2016, will need pulmonary follow-up per discharge   Rest per NP/medical resident whose note is outlined above and that I agree with and edited in full.   The patient is critically ill with multiple organ systems failure and requires high complexity decision making for assessment and support, frequent evaluation and titration of therapies, application of advanced monitoring technologies and extensive interpretation of multiple databases. Critical Care Time devoted to patient care services described in this note independent of resident  time is 35 minutes.    ARigoberto NoelMD

## 2017-02-13 NOTE — Procedures (Signed)
Extubation Procedure Note  Patient Details:   Name: Jesus Sanders DOB: 11-18-1935 MRN: [id number]   Airway Documentation:     Evaluation  O2 sats: stable throughout Complications: No apparent complications Patient did tolerate procedure well. Bilateral Breath Sounds: Diminished   Yes, Placed on 4L [place]  SPO2 97%  Gonzella Lex 02/13/2017, 9:22 AM

## 2017-02-13 NOTE — Consult Note (Signed)
Cardiology Consult    Patient ID: Jesus [facility] MRN: [id number], DOB/AGE: 1936-01-21   Admit date: 02/27/2017 Date of Consult: 02/13/2017  Primary Physician: Leonard Downing, MD Reason for Consult: Tachycardia Primary Cardiologist: Dr. Martinique   EP: Dr. Caryl Comes Requesting Provider: Dr. Corrie Dandy   History of Present Illness    Jesus Sanders is a 81 y.o. male who is being seen today for the evaluation of tachycardia at the request of Dr. Corrie Dandy.  Past medical history is significant for CAD (s/p anterior STEMI in 2012 with BMS to LAD), ischemic cardiomyopathy (s/p ICD placement in 2013), COPD, and prior PE (no longer on anticoagulation) who presented to [facility] on 03/04/2017 for evaluation of abdominal pain.  Reported developing signficant LLQ pain the day prior with no associated nausea or vomiting.  CT Imaging showed a inflammatory changes surrounding loops of thickened jejunum near the gastrojejunostomy, new ill-defined liver segment 4B hypodense lesion measuring 16 mm, possibly metastasis, and 3.3 cm stable infrarenal abdominal aortic aneurysm. He was started on Vancomycin and Zosyn.   He went to the OR on 02/20/2017 for peritonitis with a perforated viscus and underwent an exploratory laparotomy with closure of a marginal ulcer. He was initially hypotensive following the procedure but this has continued to improve (most recent pressure at 115/74).   Cardiology is asked to see today, for around [place] he went into a wide-complex tachycardia with HR to the 170's. He was given an IV Amiodarone bolus and is awaiting an IV Amiodarone drip. He was asymptomatic with this, denying any chest pain or palpitations. He has not been on standing Toprol XL 100 mg BID since admission.  Most recent labs show WBC 10.7, Hgb 15.0, platelets 79. K+ 4.5, creatinine 2.61 (worsening).  Past Medical History   Past Medical History:  Diagnosis Date  . AAA (abdominal aortic aneurysm) ([place])    a. CT  07/2015 - mild aneurysmal dilatation of the distal abdominal aorta measuring 3.2 x 3.4 cm.  Marland Kitchen CAD (coronary artery disease)    a. 07/2011 Anterior apical STEMI/Cath/PCI: LM nl, LAD 100p/m (2.5 x 69m Mini-Vision BMS), LCX 40p, RCA dominant, nl, EF 30%.  . Chronic bronchitis   . Chronic respiratory failure ([place]   . COPD (chronic obstructive pulmonary disease) ([place]   . Dil[place] of thoracic aorta ([place]    a. CT 07/2015 - aneurysmal dilatation of the descending thoracic aorta measuring 3.9 cm in greatest diameter.  . Duodenal perforation ([facility] June 2012  . GERD (gastroesophageal reflux disease)   . History of DVT (deep vein thrombosis)   . Hypertension   . Ischemic cardiomyopathy    a. 01/2012 S/P MDT Protecta single lead ICD, ser # [id number]  . Myocardial infarction   . Peritonitis ([facility] June 2012  . Pneumonia April 2012  . Polycythemia vera(238.4)    a. [facility]  will restart his phlebotomies from about Mar 15 2011  . [place]iteal aneurysm ([place]   . Pulmonary embolism ([place]    a. Diagnosed 07/2015 - placed on Eliquis.  . Shortness of breath   . Stomach ulcer   . Systolic CHF, chronic ([place]    a. 12/2011 Echo: EF 25-30%, mid-dist antsept/inf, apical AK, Gr 1 DD, Triv AI, Mild MR.  . Tobacco abuse    a. cigars    Past Surgical History:  Procedure Laterality Date  . CARDIAC CATHETERIZATION  Sept 2012   Normal left  main, occluded LAD, 40% LCX and normal RCA. EF is 30%  . CARDIAC DEFIBRILLATOR PLACEMENT     single chamber  . CHOLECYSTECTOMY  03/2011   Dr. Marlou Starks  . COLON SURGERY    . IMPLANTABLE CARDIOVERTER DEFIBRILLATOR IMPLANT N/A 01/11/2012   Procedure: IMPLANTABLE CARDIOVERTER DEFIBRILLATOR IMPLANT;  Surgeon: Deboraha Sprang, MD;  Location: [facility] CATH LAB;  Service: Cardiovascular;  Laterality: N/A; [employer]tr[employer]  . PERIPHERAL VASCULAR CATHETERIZATION N/A 08/05/2015   Procedure: Lower Extremity Angiography;  Surgeon: Serafina Mitchell, MD;  Location: [place] CV LAB;  Service: Cardiovascular;  Laterality: N/A;  . REPAIR OF PERFORATED ULCER N/A 02/23/2017   Procedure: REPAIR OF PERFORATED ULCER;  Surgeon: Erroll Luna, MD;  Location: [facility];  Service: General;  Laterality: N/A;  . SHOULDER ARTHROSCOPY     left, rotatotor cuff tendinopathy  . US ECHOCARDIOGRAPHY  Sept 2012   EF 25 to 30% with akinesis of the mid to distal anterior and apical myocardium, trivial AI and no apical thrombus     Allergies  No Known Allergies  Inpatient Medications    . amiodarone  150 mg Intravenous Once  . arformoterol  15 mcg Nebulization BID  . budesonide (PULMICORT) nebulizer solution  0.5 mg Nebulization BID  . chlorhexidine gluconate (MEDLINE KIT)  15 mL Mouth Rinse BID  . Chlorhexidine Gluconate Cloth  6 each Topical Daily  . fluconazole (DIFLUCAN) IV  200 mg Intravenous Q24H  . heparin subcutaneous  5,000 Units Subcutaneous Q8H  . ipratropium-albuterol  3 mL Nebulization Q6H  . mouth rinse  15 mL Mouth Rinse QID  . pantoprazole (PROTONIX) IV  40 mg Intravenous Q12H  . piperacillin-tazobactam (ZOSYN)  IV  3.375 g Intravenous Q8H  . sodium chloride flush  10-40 mL Intracatheter Q12H  . sodium chloride flush  3 mL Intravenous Q12H    Family History    Family History  Problem Relation Age of Onset  . Lung disease Father     [name]- worked at a [place]  . Marital status: Widowed    Spouse name: N/A  . Number of children: N/A  . Years of education: N/A   Occupational History  . Not on file.   Social History Main Topics  . Smoking status: Former Smoker    Packs/day: 0.50    Years: 60.00    Types: Cigars    Quit date: 05/18/2013  . Smokeless tobacco: Current User    Types: Chew  . Alcohol use Yes     Comment: rarely   . Drug use: No  . Sexual activity: No   Other Topics Concern  . Not on file   Social History Narrative   Lives with  his granddaughter at home.    Daughter lives next door and is his healthcare power of attorney   Experiences dyspnea with minimal activity - sedentary.  Wife died about 01-22-1999 and   Retired Airline pilot.     Review of Systems    General:  No chills, fever, night sweats or weight changes.  Cardiovascular:  No chest pain, dyspnea on exertion, edema, orthopnea, palpitations, paroxysmal nocturnal dyspnea. Dermatological: No rash, lesions/masses Respiratory: No cough, dyspnea Urologic: No hematuria, dysuria Abdominal:   No nausea, vomiting, diarrhea, bright red blood per rectum, melena, or hematemesis. Positive for abdominal pain.  Neurologic:  No visual changes, wkns, changes in mental status. All other systems reviewed and are  otherwise negative except as noted above.  Physical Exam    Blood pressure (!) 96/54, pulse (!) 115, temperature 98 F (36.7 C), temperature source Oral, resp. rate (!) 22, height 6' (1.829 m), weight 220 lb (99.8 kg), SpO2 96 %.  General: Pleasant, Caucasian male appearing in NAD. On non-rebreather. Psych: Normal affect. Neuro: Alert and oriented X 3. Moves all extremities spontaneously. HEENT: Normal  Neck: Supple without bruits or JVD. Lungs:  Resp regular and unlabored, CTA without wheezing or rales. Heart: Regular rhythm, tachycardiac rate, no s3, s4, or murmurs. Abdomen: Soft, non-tender, non-distended, BS + x 4. Surgical drains in place.  Extremities: No clubbing, cyanosis or edema. DP/PT/Radials 2+ and equal bilaterally.  Labs    Troponin [facility] of Care Test)  Recent Labs  02/16/2017 2327  TROPIPOC 0.00   No results for input(s): CKTOTAL, CKMB, TROPONINI in the last 72 hours. Lab Results  Component Value Date   WBC 10.7 (H) 02/13/2017   HGB 15.0 02/13/2017   HCT 47.8 02/13/2017   MCV 88.5 02/13/2017   PLT 79 (L) 02/13/2017    Recent Labs Lab 02/13/17 0350  NA 139  K 4.5  CL 110  CO2 22  BUN 35*  CREATININE 2.61*  CALCIUM 7.6*  PROT  4.6*  BILITOT 1.1  ALKPHOS 45  ALT 33  AST 36  GLUCOSE 161*   Lab Results  Component Value Date   CHOL 136 09/01/2016   HDL 31 (L) 09/01/2016   LDLCALC 78 09/01/2016   TRIG 134 09/01/2016   Lab Results  Component Value Date   DDIMER 3.39 (H) 02/21/2014     Radiology Studies    Ct Abdomen Pelvis Wo Contrast  Result Date: 03/01/2017 CLINICAL DATA:  Pneumoperitoneum.  Left lower quadrant pain. EXAM: CT ABDOMEN AND PELVIS WITHOUT CONTRAST TECHNIQUE: Multidetector CT imaging of the abdomen and pelvis was performed following the standard protocol without IV contrast. COMPARISON:  02/19/2017 at 00:10 FINDINGS: Lower chest: Calcified pleural plaque and atelectatic appearing lung base opacities are again evident. No pleural effusions. No interval change. Hepatobiliary: Unchanged subtle hypodense lesion in segment 4B, concerning for possible metastatic deposit. No other focal liver lesions. Cholecystectomy. No bile duct dilatation. Pancreas: Unremarkable. No pancreatic ductal dilatation or surrounding inflammatory changes. Spleen: Normal in size without focal abnormality. Adrenals/Urinary Tract: Left adrenal adenoma. No acute urinary tract abnormalities. Stomach/Bowel: Oral contrast has been administered. The gastrojejunostomy is patent. There is a perianastomotic ulceration of the jejunum, and this appears likely to be the source of the pneumoperitoneum. This is seen on sagittal series 204 images 119 through 125. There also is inflammatory appearing stranding in the fat around the proximal jejunum. No bowel obstruction. Vascular/Lymphatic: Extensive atherosclerotic aortic calcification. Infrarenal abdominal aortic aneurysm measuring 3.3 cm. Reproductive: Unremarkable Other: No ascites. Musculoskeletal: No significant skeletal lesion. IMPRESSION: 1. Repeat scanning with enteric contrast demonstrates patency of the gastrojejunostomy. There is a perianastomotic ulcer of the jejunum adjacent to the  gastrojejunostomy, and this appears to communicate directly into the pneumoperitoneum. 2. No other new findings compared to the earlier study. Electronically Signed   By: Andreas Newport M.D.   On: 02/13/2017 05:58   Dg Chest Port 1 View  Result Date: 03/04/2017 CLINICAL DATA:  Endotracheal and central line placement EXAM: PORTABLE CHEST 1 VIEW COMPARISON:  02/19/2017 CXR FINDINGS: Endotracheal tube tip is 4.9 cm above the carina in satisfactory position. Right IJ central line catheter terminates in the mid SVC level. Left-sided defibrillator apparatus obscures the spiculated left  upper lobe mass seen previously. Leads project within the right atrium and right ventricle off the ICD device. Stable cardiomegaly with aortic atherosclerosis. Central vascular congestion is noted without pneumothorax. Probable small bilateral pleural effusions. Retrocardiac opacities likely representing areas of atelectasis of CHF. Superimposed pneumonia would be difficult to entirely exclude. IMPRESSION: 1. Satisfactory support line and tube positions. No pneumothorax post IJ central line catheter placement. 2. Cardiomegaly with aortic atherosclerosis mild central vascular congestion consistent with CHF. Retrocardiac atelectasis CHF noted. Superimposed pneumonia would be difficult to entirely exclude. Small bilateral pleural effusions. 3. Spiculated mass in the left upper lobe is obscured by the ICD device. Electronically Signed   By: Ashley Royalty M.D.   On: 02/22/2017 20:07   Dg Chest Port 1 View  Result Date: 02/22/2017 CLINICAL DATA:  Left lower quadrant pain, onset today.  Dyspnea. EXAM: PORTABLE CHEST 1 VIEW COMPARISON:  09/20/2016, 12/12/2016. FINDINGS: Spiculated left upper lobe mass again evident. Patchy opacity in the lateral left base could represent a infectious infiltrate. This is new. Emphysematous and fibrotic appearing changes persist. Unchanged mild cardiomegaly. IMPRESSION: Patchy opacity in the lateral left base  could represent pneumonia. Hyperinflation and cardiomegaly are unchanged. Persistent left upper lobe spiculated mass. Electronically Signed   By: Andreas Newport M.D.   On: 03/04/2017 23:17   Ct Renal Stone Study  Result Date: 02/18/2017 CLINICAL DATA:  81 y/o M; left lower quadrant abdominal pain. History of stomach ulcer, GERD, duodenum perforation, abdominal aortic aneurysm, colon surgery, cholecystectomy, and lung mass. EXAM: CT ABDOMEN AND PELVIS WITHOUT CONTRAST TECHNIQUE: Multidetector CT imaging of the abdomen and pelvis was performed following the standard protocol without IV contrast. COMPARISON:  09/20/2016 PET-CT.  06/22/2011 CT abdomen and pelvis. FINDINGS: Lower chest: Stable right lower lobe atelectasis and round atelectasis. Calcific atherosclerosis of the aortic valve. AICD lead noted within the right ventricle. Right calcific pleural thickening posteriorly. Hepatobiliary: Ill-defined segment 4B hypoattenuating lesion measuring 16 mm new from prior studies (series 3: Image 24). Pancreas: Unremarkable. No pancreatic ductal dilatation or surrounding inflammatory changes. Spleen: Normal in size without focal abnormality. Adrenals/Urinary Tract: 27 x 25 mm left adrenal mass. Multiple well-circumscribed fluid attenuating foci within the kidneys bilaterally compatible with cysts the largest in the left interpolar region measuring 22 mm. There is an isodense exophytic structure of the left kidney interpolar region measuring 10 mm (series 3, image 36) probably representing a hemorrhagic cyst that is stable. No hydronephrosis. Normal bladder. Stomach/Bowel: Postsurgical changes of proximal duodenum. Patent loop gastrojejunostomy. There are inflammatory changes of fat surrounding loops of wall thickened jejunum in the left upper quadrant extending to the gastrojejunal junction and there is a small volume of pneumoperitoneum. No evidence for bowel obstruction. Vascular/Lymphatic: Aortic atherosclerosis.  No enlarged abdominal or pelvic lymph nodes. 3.3 cm infrarenal abdominal aortic aneurysm. Reproductive: Moderate prostate enlargement. Other: No abdominal wall hernia or abnormality. No abdominopelvic ascites. Musculoskeletal: Advanced cervical spondylosis greatest at the L5-S1 level with there is severe disc space narrowing and lower lumbar facet arthrosis. No acute osseous abnormality identified. Mild bilateral hip osteoarthrosis. IMPRESSION: 1. Inflammatory changes surrounding loops of thickened jejunum near the gastrojejunostomy may be infectious, inflammatory, or related to ischemia. Small volume of pneumoperitoneum indicates perforation. 2. New ill-defined liver segment 4B hypodense lesion measuring 16 mm, possibly metastasis. 3. Right posterior lower lobe calcified pleural plaque and round atelectasis is stable. 4. 3.3 cm stable infrarenal abdominal aortic aneurysm. Recommend followup by ultrasound in 3 years. This recommendation follows ACR consensus guidelines: White  Paper of the ACR Incidental Findings Committee II on Vascular Findings. J Am Coll Radiol 2013; 10:789-794. 5. Moderate prostate enlargement. 6. Stable left adrenal adenoma. These results were called by telephone at the time of interpretation on 02/17/2017 at 1:06 am to Dr. Waynetta Pean , who verbally acknowledged these results. Electronically Signed   By: Kristine Garbe M.D.   On: 02/20/2017 01:06    EKG & Cardiac Imaging    EKG:  Sinus tachycardia, HR 112, known RBBB - Personally Reviewed  Echocardiogram: 08/31/2016 Study Conclusions  - Left ventricle: The cavity size was mildly dilated. Wall   thickness was normal. Systolic function was mildly reduced. The   estimated ejection fraction was in the range of 45% to 50%. There   is akinesis of the apical myocardium. There is akinesis of the   midanteroseptal myocardium. Doppler parameters are consistent   with abnormal left ventricular relaxation (grade 1 diastolic    dysfunction). There was an apicalthrombus. - Aortic valve: There was trivial regurgitation. - Aorta: Aortic root dimension: 40 mm (ED). Ascending aortic   diameter: 41 mm (S). - Aortic root: The aortic root was mildly dilated. - Ascending aorta: The ascending aorta was mildly dilated. - Pulmonic valve: There was no regurgitation.  Impressions:  - Definity used; apical akinesis with overall mildly reduced LV   systolic function; grade 1 diastolic dysfunction; apical   thrombus; trace AI; mildly dilated aortic root; results called to   Jettie Booze, NP. Assessment & Plan    1. Wide-complex tachycardia - admitted with sepsis in the setting of a perforated ulcer and noted to have gone into a wide-complex tachycardia this evening. Most concerning for sustained VT.  He was given an IV Amiodarone bolus and is awaiting access for an IV Amiodarone drip. - will interrogate device tomorrow.  - was on Toprol-XL 161m BID as an outpatient. Held on admission in the setting of hypotension. Will restart IV Lopressor with hold parameters in place for episodes of hypotension. Keep K+ ~ 4.0 and Mg > 2.0.  2. CAD - s/p anterior STEMI in 2012 with BMS to LAD. - he denies any recent anginal symptoms.  - on ASA, BB, and statin therapy prior to admission. Plan to resume IV Lopressor.   3. Ischemic Cardiomyopathy - s/p ICD placement in 2013 - plan for device interrogation as above.   4. Sepsis in the setting of a perforated ulcer -  underwent an exploratory laparotomy on 02/22/2017 with closure of a marginal ulcer.  - per admitting team   5. Liver Lesion - CT this admission showing a new ill-defined liver segment 4B hypodense lesion measuring 16 mm, possibly metastasis. - will need to be followed in the outpatient setting.   Signed, BErma Heritage PA-C 02/13/2017, 7:11 PM Pager: [phone]  Attending note:  Patient seen and examined. Reviewed records and discussed the case with Ms. SDelano Metz Mr. MBallenis a 81year old male patient of Dr. KCaryl Comeswith a history of ischemic cardiomyopathy, most recent LVEF 45-50% range, previous anterior STEMI in 2012 treated with BMS to the LAD, Medtronic ICD in place. He is currently admitted to the hospital after developing severe left lower quadrant pain ultimately with findings of peritonitis and a perforated viscus, now status post exploratory laparotomy with closure of a marginal ulcer on April 8. He remains in the ICU and was extubated earlier today. We are consulted to see the patient secondary to development of a sustained wide complex tachycardia this  evening. He has had sinus tachycardia during the day, increasing ventricular ectopy and bigeminy, ultimately wide-complex tachycardia up to the 170s concerning for ventricular tachycardia in light of his substrate, however the possibility of aberrant SVT is to be considered, particularly in light of findings of SVT at similar rates on his most recent outpatient device interrogation. Of note, he has not been on Toprol-XL 100 mg twice daily since admission to the hospital and is currently nothing by mouth with NG tube in place. Patient is awake, does not describe any chest pain or palpitations with the recent rhythm change.  On examination he is in no acute distress. Systolic blood pressure 161-096 range, heart rate 110 to 120 in sinus tachycardia with PVCs by telemetry. Lungs exhibit diminished breath sounds without wheezing. Cardiac exam reveals distant regular heart sounds with ectopy. He has an NG tube in place. Dressing on the abdomen with drain in place. Mild leg edema is noted bilaterally with SCDs in place. Lab work shows potassium 4.7, creatinine 3.07 which is up from 1.2 on admission. Baseline ECG shows sinus rhythm with old anterior infarct pattern, right bundle branch block, left anterior fascicular block.   Wide-complex tachycardia concerning for ventricular tachycardia in light of cardiac  substrate, although aberrant SVT is also possible considering recent device interrogations showing SVT at similar rates. He is off high-dose beta blocker at this time with NPO status which is also likely contributing. Plan at this point is to continue with IV amiodarone pending further stabilization of physiologic stressors and overall condition. If blood pressure tolerates would also start Lopressor 2.5 mg IV every 6 hours and titrate for effect and eventually work toward resuming oral beta blocker. Would follow electrolytes, also repeat ECG. Ischemia not necessarily suspected as a driving precipitant, although with his substrate he should be followed closely.  Satira Sark, M.D., F.A.C.C.

## 2017-02-13 NOTE — Progress Notes (Signed)
Blood pressure sustaining in the low 09O systolic. Dr. Hetty Ely made aware. Will continue to monitor.

## 2017-02-13 NOTE — Progress Notes (Signed)
1 Day Post-Op  Subjective: Intubated, alert follows commands  Objective: Vital signs in last 24 hours: Temp:  [97.4 F (36.3 C)-98.3 F (36.8 C)] 98.3 F (36.8 C) (04/09 0400) Pulse Rate:  [70-107] 91 (04/09 0700) Resp:  [16-31] 16 (04/09 0700) BP: (74-150)/(49-95) 80/51 (04/09 0700) SpO2:  [94 %-100 %] 99 % (04/09 0700) Arterial Line BP: (93-194)/(54-113) 93/54 (04/08 1842) FiO2 (%):  [40 %-100 %] 40 % (04/09 0254)    Intake/Output from previous day: 04/08 0701 - 04/09 0700 In: 4136.8 [I.V.:3386.8; IV Piggyback:750] Out: 730 [Urine:290; Drains:340; Blood:100] Intake/Output this shift: No intake/output data recorded.  Resp: coarse bilateral breath sounds Cardio: regular rate and rhythm GI: approp tender wound clean drains serous  Lab Results:   Recent Labs  02/06/2017 2030 02/06/2017 1518 02/13/17 0350  WBC 7.5  --  10.7*  HGB 16.1 17.0 15.0  HCT 51.0 50.0 47.8  PLT 129*  --  79*   BMET  Recent Labs  03/02/2017 2030 02/07/2017 1518 02/13/17 0350  NA 140 139 139  K 5.2* 5.4* 4.5  CL 104  --  110  CO2 31  --  22  GLUCOSE 108*  --  161*  BUN 20  --  35*  CREATININE 1.24  --  2.61*  CALCIUM 9.0  --  7.6*   PT/INR No results for input(s): LABPROT, INR in the last 72 hours. ABG  Recent Labs  02/23/2017 1518  PHART 7.263*  HCO3 22.1    Studies/Results: Ct Abdomen Pelvis Wo Contrast  Result Date: 02/09/2017 CLINICAL DATA:  Pneumoperitoneum.  Left lower quadrant pain. EXAM: CT ABDOMEN AND PELVIS WITHOUT CONTRAST TECHNIQUE: Multidetector CT imaging of the abdomen and pelvis was performed following the standard protocol without IV contrast. COMPARISON:  02/15/2017 at 00:10 FINDINGS: Lower chest: Calcified pleural plaque and atelectatic appearing lung base opacities are again evident. No pleural effusions. No interval change. Hepatobiliary: Unchanged subtle hypodense lesion in segment 4B, concerning for possible metastatic deposit. No other focal liver lesions.  Cholecystectomy. No bile duct dilatation. Pancreas: Unremarkable. No pancreatic ductal dilatation or surrounding inflammatory changes. Spleen: Normal in size without focal abnormality. Adrenals/Urinary Tract: Left adrenal adenoma. No acute urinary tract abnormalities. Stomach/Bowel: Oral contrast has been administered. The gastrojejunostomy is patent. There is a perianastomotic ulceration of the jejunum, and this appears likely to be the source of the pneumoperitoneum. This is seen on sagittal series 204 images 119 through 125. There also is inflammatory appearing stranding in the fat around the proximal jejunum. No bowel obstruction. Vascular/Lymphatic: Extensive atherosclerotic aortic calcification. Infrarenal abdominal aortic aneurysm measuring 3.3 cm. Reproductive: Unremarkable Other: No ascites. Musculoskeletal: No significant skeletal lesion. IMPRESSION: 1. Repeat scanning with enteric contrast demonstrates patency of the gastrojejunostomy. There is a perianastomotic ulcer of the jejunum adjacent to the gastrojejunostomy, and this appears to communicate directly into the pneumoperitoneum. 2. No other new findings compared to the earlier study. Electronically Signed   By: Andreas Newport M.D.   On: 02/10/2017 05:58   Dg Chest Port 1 View  Result Date: 02/13/2017 CLINICAL DATA:  Endotracheal and central line placement EXAM: PORTABLE CHEST 1 VIEW COMPARISON:  02/23/2017 CXR FINDINGS: Endotracheal tube tip is 4.9 cm above the carina in satisfactory position. Right IJ central line catheter terminates in the mid SVC level. Left-sided defibrillator apparatus obscures the spiculated left upper lobe mass seen previously. Leads project within the right atrium and right ventricle off the ICD device. Stable cardiomegaly with aortic atherosclerosis. Central vascular congestion is noted without  pneumothorax. Probable small bilateral pleural effusions. Retrocardiac opacities likely representing areas of atelectasis of  CHF. Superimposed pneumonia would be difficult to entirely exclude. IMPRESSION: 1. Satisfactory support line and tube positions. No pneumothorax post IJ central line catheter placement. 2. Cardiomegaly with aortic atherosclerosis mild central vascular congestion consistent with CHF. Retrocardiac atelectasis CHF noted. Superimposed pneumonia would be difficult to entirely exclude. Small bilateral pleural effusions. 3. Spiculated mass in the left upper lobe is obscured by the ICD device. Electronically Signed   By: Ashley Royalty M.D.   On: 03/03/2017 20:07   Dg Chest Port 1 View  Result Date: 02/27/2017 CLINICAL DATA:  Left lower quadrant pain, onset today.  Dyspnea. EXAM: PORTABLE CHEST 1 VIEW COMPARISON:  09/20/2016, 12/12/2016. FINDINGS: Spiculated left upper lobe mass again evident. Patchy opacity in the lateral left base could represent a infectious infiltrate. This is new. Emphysematous and fibrotic appearing changes persist. Unchanged mild cardiomegaly. IMPRESSION: Patchy opacity in the lateral left base could represent pneumonia. Hyperinflation and cardiomegaly are unchanged. Persistent left upper lobe spiculated mass. Electronically Signed   By: Andreas Newport M.D.   On: 02/17/2017 23:17   Ct Renal Stone Study  Result Date: 02/15/2017 CLINICAL DATA:  81 y/o M; left lower quadrant abdominal pain. History of stomach ulcer, GERD, duodenum perforation, abdominal aortic aneurysm, colon surgery, cholecystectomy, and lung mass. EXAM: CT ABDOMEN AND PELVIS WITHOUT CONTRAST TECHNIQUE: Multidetector CT imaging of the abdomen and pelvis was performed following the standard protocol without IV contrast. COMPARISON:  09/20/2016 PET-CT.  06/22/2011 CT abdomen and pelvis. FINDINGS: Lower chest: Stable right lower lobe atelectasis and round atelectasis. Calcific atherosclerosis of the aortic valve. AICD lead noted within the right ventricle. Right calcific pleural thickening posteriorly. Hepatobiliary: Ill-defined  segment 4B hypoattenuating lesion measuring 16 mm new from prior studies (series 3: Image 24). Pancreas: Unremarkable. No pancreatic ductal dilatation or surrounding inflammatory changes. Spleen: Normal in size without focal abnormality. Adrenals/Urinary Tract: 27 x 25 mm left adrenal mass. Multiple well-circumscribed fluid attenuating foci within the kidneys bilaterally compatible with cysts the largest in the left interpolar region measuring 22 mm. There is an isodense exophytic structure of the left kidney interpolar region measuring 10 mm (series 3, image 36) probably representing a hemorrhagic cyst that is stable. No hydronephrosis. Normal bladder. Stomach/Bowel: Postsurgical changes of proximal duodenum. Patent loop gastrojejunostomy. There are inflammatory changes of fat surrounding loops of wall thickened jejunum in the left upper quadrant extending to the gastrojejunal junction and there is a small volume of pneumoperitoneum. No evidence for bowel obstruction. Vascular/Lymphatic: Aortic atherosclerosis. No enlarged abdominal or pelvic lymph nodes. 3.3 cm infrarenal abdominal aortic aneurysm. Reproductive: Moderate prostate enlargement. Other: No abdominal wall hernia or abnormality. No abdominopelvic ascites. Musculoskeletal: Advanced cervical spondylosis greatest at the L5-S1 level with there is severe disc space narrowing and lower lumbar facet arthrosis. No acute osseous abnormality identified. Mild bilateral hip osteoarthrosis. IMPRESSION: 1. Inflammatory changes surrounding loops of thickened jejunum near the gastrojejunostomy may be infectious, inflammatory, or related to ischemia. Small volume of pneumoperitoneum indicates perforation. 2. New ill-defined liver segment 4B hypodense lesion measuring 16 mm, possibly metastasis. 3. Right posterior lower lobe calcified pleural plaque and round atelectasis is stable. 4. 3.3 cm stable infrarenal abdominal aortic aneurysm. Recommend followup by ultrasound in  3 years. This recommendation follows ACR consensus guidelines: White Paper of the ACR Incidental Findings Committee II on Vascular Findings. J Am Coll Radiol 2013; 10:789-794. 5. Moderate prostate enlargement. 6. Stable left adrenal adenoma. These results  were called by telephone at the time of interpretation on 02/10/2017 at 1:06 am to Dr. Waynetta Pean , who verbally acknowledged these results. Electronically Signed   By: Kristine Garbe M.D.   On: 02/21/2017 01:06    Anti-infectives: Anti-infectives    Start     Dose/Rate Route Frequency Ordered Stop   02/13/2017 1400  piperacillin-tazobactam (ZOSYN) IVPB 3.375 g     3.375 g 12.5 mL/hr over 240 Minutes Intravenous Every 8 hours 02/18/2017 1329     02/15/2017 1400  vancomycin (VANCOCIN) IVPB 750 mg/150 ml premix     750 mg 150 mL/hr over 60 Minutes Intravenous Every 12 hours 02/13/2017 1329     02/06/2017 1330  fluconazole (DIFLUCAN) IVPB 400 mg     400 mg 100 mL/hr over 120 Minutes Intravenous Every 24 hours 02/21/2017 1254     03/02/2017 1230  fluconazole (DIFLUCAN) IVPB 100 mg  Status:  Discontinued     100 mg 50 mL/hr over 60 Minutes Intravenous Every 24 hours 02/26/2017 1228 02/18/2017 1253   02/10/2017 0130  vancomycin (VANCOCIN) IVPB 1000 mg/200 mL premix     1,000 mg 200 mL/hr over 60 Minutes Intravenous  Once 02/07/2017 0118 03/03/2017 0235   02/17/2017 0115  piperacillin-tazobactam (ZOSYN) IVPB 3.375 g     3.375 g 100 mL/hr over 30 Minutes Intravenous  Once 02/10/2017 0108 02/13/2017 0134      Assessment/Plan: POD 1 graham patch perforation of marginal ulcer- Dr Brantley Stage  1. Hopefully can extubate today 2. Continue protonix bid, ng tube (do not manipulate), npo- will eventually do ugi in several days to assess repair prior to any oral intake 3. Bid dressing changes 4. Ok to start proph lovenox from surgical standpoint if ok with icu   [facility] 02/13/2017

## 2017-02-14 ENCOUNTER — Inpatient Hospital Stay (HOSPITAL_COMMUNITY): Payer: Medicare Other

## 2017-02-14 DIAGNOSIS — I472 Ventricular tachycardia: Secondary | ICD-10-CM

## 2017-02-14 DIAGNOSIS — R000 Tachycardia, unspecified: Secondary | ICD-10-CM

## 2017-02-14 LAB — MAGNESIUM: Magnesium: 1.7 mg/dL (ref 1.7–2.4)

## 2017-02-14 LAB — POCT I-STAT 3, ART BLOOD GAS (G3+)
ACID-BASE DEFICIT: 9 mmol/L — AB (ref 0.0–2.0)
Acid-base deficit: 8 mmol/L — ABNORMAL HIGH (ref 0.0–2.0)
BICARBONATE: 18.9 mmol/L — AB (ref 20.0–28.0)
Bicarbonate: 19.3 mmol/L — ABNORMAL LOW (ref 20.0–28.0)
O2 Saturation: 84 %
O2 Saturation: 100 %
PCO2 ART: 46 mmHg (ref 32.0–48.0)
PCO2 ART: 45 mmHg (ref 32.0–48.0)
PH ART: 7.24 — AB (ref 7.350–7.450)
PO2 ART: 58 mmHg — AB (ref 83.0–108.0)
Patient temperature: 98.9
TCO2: 20 mmol/L (ref 0–100)
TCO2: 21 mmol/L (ref 0–100)
pH, Arterial: 7.224 — ABNORMAL LOW (ref 7.350–7.450)
pO2, Arterial: 215 mmHg — ABNORMAL HIGH (ref 83.0–108.0)

## 2017-02-14 LAB — LACTATE DEHYDROGENASE: LDH: 242 U/L — AB (ref 98–192)

## 2017-02-14 LAB — CBC
HEMATOCRIT: 48.4 % (ref 39.0–52.0)
HEMOGLOBIN: 15.3 g/dL (ref 13.0–17.0)
MCH: 27.9 pg (ref 26.0–34.0)
MCHC: 31.6 g/dL (ref 30.0–36.0)
MCV: 88.3 fL (ref 78.0–100.0)
Platelets: 94 10*3/uL — ABNORMAL LOW (ref 150–400)
RBC: 5.48 MIL/uL (ref 4.22–5.81)
RDW: 16.9 % — ABNORMAL HIGH (ref 11.5–15.5)
WBC: 13.2 10*3/uL — ABNORMAL HIGH (ref 4.0–10.5)

## 2017-02-14 LAB — BASIC METABOLIC PANEL
ANION GAP: 9 (ref 5–15)
BUN: 41 mg/dL — ABNORMAL HIGH (ref 6–20)
CHLORIDE: 108 mmol/L (ref 101–111)
CO2: 23 mmol/L (ref 22–32)
Calcium: 8.2 mg/dL — ABNORMAL LOW (ref 8.9–10.3)
Creatinine, Ser: 3.33 mg/dL — ABNORMAL HIGH (ref 0.61–1.24)
GFR calc Af Amer: 19 mL/min — ABNORMAL LOW (ref >60)
GFR calc non Af Amer: 16 mL/min — ABNORMAL LOW (ref >60)
Glucose, Bld: 109 mg/dL — ABNORMAL HIGH (ref 65–99)
Potassium: 4.2 mmol/L (ref 3.5–5.1)
Sodium: 140 mmol/L (ref 135–145)

## 2017-02-14 LAB — LACTIC ACID, PLASMA
Lactic Acid, Venous: 0.8 mmol/L (ref 0.5–1.9)
Lactic Acid, Venous: 0.9 mmol/L (ref 0.5–1.9)

## 2017-02-14 MED ORDER — SODIUM CHLORIDE 0.9 % IV SOLN
100.0000 mg | INTRAVENOUS | Status: DC
  Administered 2017-02-15 – 2017-02-16 (×2): 100 mg via INTRAVENOUS
  Filled 2017-02-14 (×3): qty 100

## 2017-02-14 MED ORDER — MIDAZOLAM HCL 2 MG/2ML IJ SOLN
INTRAMUSCULAR | Status: AC
  Administered 2017-02-14: 2 mg
  Filled 2017-02-14: qty 4

## 2017-02-14 MED ORDER — ANIDULAFUNGIN 100 MG IV SOLR
200.0000 mg | Freq: Once | INTRAVENOUS | Status: AC
  Administered 2017-02-14: 200 mg via INTRAVENOUS
  Filled 2017-02-14: qty 200

## 2017-02-14 MED ORDER — VANCOMYCIN HCL IN DEXTROSE 1-5 GM/200ML-% IV SOLN: 1000.0000 mg | INTRAVENOUS | Status: DC

## 2017-02-14 MED ORDER — FENTANYL CITRATE (PF) 100 MCG/2ML IJ SOLN
INTRAMUSCULAR | Status: AC
  Filled 2017-02-14: qty 4

## 2017-02-14 MED ORDER — FENTANYL CITRATE (PF) 100 MCG/2ML IJ SOLN: 50.0000 ug | Freq: Once | INTRAMUSCULAR | Status: AC

## 2017-02-14 MED ORDER — DEXMEDETOMIDINE HCL IN NACL 400 MCG/100ML IV SOLN
0.4000 ug/kg/h | INTRAVENOUS | Status: DC
  Administered 2017-02-16: 0.8 ug/kg/h via INTRAVENOUS
  Administered 2017-02-16: 1 ug/kg/h via INTRAVENOUS
  Administered 2017-02-16: 1.2 ug/kg/h via INTRAVENOUS
  Administered 2017-02-16: 0.8 ug/kg/h via INTRAVENOUS
  Administered 2017-02-16: 0.4 ug/kg/h via INTRAVENOUS
  Administered 2017-02-16: 1 ug/kg/h via INTRAVENOUS
  Filled 2017-02-14 (×8): qty 100

## 2017-02-14 MED ORDER — LORAZEPAM 2 MG/ML IJ SOLN
0.5000 mg | INTRAMUSCULAR | Status: DC | PRN
  Administered 2017-02-14: 0.5 mg via INTRAVENOUS
  Filled 2017-02-14: qty 1

## 2017-02-14 MED ORDER — NOREPINEPHRINE BITARTRATE 1 MG/ML IV SOLN
0.0000 ug/min | INTRAVENOUS | Status: DC
  Administered 2017-02-14: 5 ug/min via INTRAVENOUS
  Administered 2017-02-15: 2 ug/min via INTRAVENOUS
  Filled 2017-02-14 (×2): qty 4

## 2017-02-14 MED ORDER — SODIUM CHLORIDE 0.9 % IV SOLN
8.0000 mg/h | INTRAVENOUS | Status: DC
  Administered 2017-02-14 – 2017-02-17 (×6): 8 mg/h via INTRAVENOUS
  Filled 2017-02-14 (×13): qty 80

## 2017-02-14 MED ORDER — ETOMIDATE 2 MG/ML IV SOLN
30.0000 mg | Freq: Once | INTRAVENOUS | Status: AC
  Administered 2017-02-14: 20 mg via INTRAVENOUS

## 2017-02-14 MED ORDER — FENTANYL 2500MCG IN NS 250ML (10MCG/ML) PREMIX INFUSION
25.0000 ug/h | INTRAVENOUS | Status: DC
  Administered 2017-02-14: 50 ug/h via INTRAVENOUS
  Administered 2017-02-15: 300 ug/h via INTRAVENOUS
  Administered 2017-02-15: 200 ug/h via INTRAVENOUS
  Administered 2017-02-16: 400 ug/h via INTRAVENOUS
  Administered 2017-02-16: 100 ug/h via INTRAVENOUS
  Filled 2017-02-14 (×6): qty 250

## 2017-02-14 MED ORDER — LORAZEPAM 2 MG/ML IJ SOLN
0.5000 mg | INTRAMUSCULAR | Status: DC | PRN
Start: 2017-02-14 — End: 2017-02-14
  Administered 2017-02-14: 0.5 mg via INTRAVENOUS
  Filled 2017-02-14: qty 1

## 2017-02-14 MED ORDER — FENTANYL BOLUS VIA INFUSION
25.0000 ug | INTRAVENOUS | Status: DC | PRN
  Filled 2017-02-14: qty 25

## 2017-02-14 MED ORDER — SODIUM CHLORIDE 0.9 % IV SOLN
1750.0000 mg | Freq: Once | INTRAVENOUS | Status: AC
  Administered 2017-02-14: 1750 mg via INTRAVENOUS
  Filled 2017-02-14: qty 1750

## 2017-02-14 NOTE — Care Management Note (Signed)
Case Management Note  Patient Details  Name: Jesus Sanders MRN: [id number] Date of Birth: Dec 26, 1935  Subjective/Objective:                 Adm w bowel perf. Extubated and re intubated 4/10.    Action/Plan:  CM will continue to follow.  Expected Discharge Date:   (unknown)               Expected Discharge Plan:     In-House Referral:     Discharge planning Services  CM Consult  Post Acute Care Choice:    Choice offered to:     DME Arranged:    DME Agency:     HH Arranged:    HH Agency:     Status of Service:  In process, will continue to follow  If discussed at Long Length of Stay Meetings, dates discussed:    Additional Comments:  Jesus Collet, RN 02/14/2017, 12:07 PM

## 2017-02-14 NOTE — Evaluation (Signed)
Physical Therapy Evaluation Patient Details Name: Jesus Sanders MRN: [id number] DOB: 1936-10-17 Today's Date: 02/14/2017   History of Present Illness  81 yo admitted with bowel perforation s/p patching on 4/8 with pt extubated 4/9 and received Ativan for agitation 4/9. PMHx: COPD, CAD, HTN  Clinical Impression  Pt alert with flat affect throughout. Pt on 4L O2 throughout session with sats 93% and HR 116. Pt with decreased strength, cognition, transfers, function and balance who will benefit from acute therapy to maximize mobility, function and strength to decrease burden of care. RN present beginning and end of session with education for mobility performed and transfer technique, daughter present end of session.  Of note:37mn after session pt in chair and requested assist by RN to return pt to bed for reintubation. Will await medical stability and clarification of further mobility prior to further therapy.   Follow Up Recommendations SNF;Supervision/Assistance - 24 hour    Equipment Recommendations  Rolling walker with 5" wheels    Recommendations for Other Services       Precautions / Restrictions Precautions Precautions: Fall Precaution Comments: NGtube, Aline, 2 drains, abdominal wound Restrictions Weight Bearing Restrictions: No      Mobility  Bed Mobility Overal bed mobility: Needs Assistance Bed Mobility: Rolling;Sidelying to Sit Rolling: Mod assist;+2 for physical assistance Sidelying to sit: Mod assist;+2 for physical assistance;HOB elevated       General bed mobility comments: cues for bending right knee with pt using pillow to splint abdomen for mobility. Assist to rotate trunk, elevate trunk and move legs to EOB. Min assist for sitting balance with progression to minguard  Transfers Overall transfer level: Needs assistance   Transfers: Sit to/from Stand Sit to Stand: Mod assist;+2 physical assistance;From elevated surface         General transfer comment:  cues for hand placement and sequence with pt grasping RW and pushing into it when present with max cues for sequence and safety. pt stood x 2 with mod assist for rise with cues. 2nd stand pt pivoted to chair with bil UE assist and cues for sequence  Ambulation/Gait                Stairs            Wheelchair Mobility    Modified Rankin (Stroke Patients Only)       Balance Overall balance assessment: Needs assistance   Sitting balance-Leahy Scale: Poor       Standing balance-Leahy Scale: Poor                               Pertinent Vitals/Pain Pain Assessment: No/denies pain    Home Living Family/patient expects to be discharged to:: Private residence Living Arrangements: Other relatives Available Help at Discharge: Family;Available 24 hours/day Type of Home: House Home Access: Stairs to enter Entrance Stairs-Rails: RPsychiatric nurseof Steps: 3 Home Layout: One level Home Equipment: Cane - single point;Grab bars - tub/shower      Prior Function Level of Independence: Independent               Hand Dominance        Extremity/Trunk Assessment   Upper Extremity Assessment Upper Extremity Assessment: Generalized weakness    Lower Extremity Assessment Lower Extremity Assessment: Generalized weakness    Cervical / Trunk Assessment Cervical / Trunk Assessment: Kyphotic  Communication   Communication: No difficulties  Cognition Arousal/Alertness: Awake/alert Behavior During Therapy:  Flat affect Overall Cognitive Status: Impaired/Different from baseline Area of Impairment: Problem solving;Orientation                 Orientation Level: Time           Problem Solving: Slow processing General Comments: Pt one day off with orientation, aware of place and situation, able to answer questions with delay      General Comments      Exercises     Assessment/Plan    PT Assessment Patient needs  continued PT services  PT Problem List Decreased strength;Decreased mobility;Decreased activity tolerance;Decreased cognition;Decreased balance;Decreased knowledge of use of DME;Decreased coordination       PT Treatment Interventions DME instruction;Therapeutic activities;Gait training;Therapeutic exercise;Patient/family education;Balance training;Functional mobility training;Stair training    PT Goals (Current goals can be found in the Care Plan section)  Acute Rehab PT Goals Patient Stated Goal: return home PT Goal Formulation: With patient/family Time For Goal Achievement: 02/28/17 Potential to Achieve Goals: Fair    Frequency Min 3X/week   Barriers to discharge   daughter reports family can arrange 24hr care    Co-evaluation               End of Session Equipment Utilized During Treatment: Gait belt;Oxygen Activity Tolerance: Patient tolerated treatment well Patient left: in chair;with call bell/phone within reach;with nursing/sitter in room;with chair alarm set;with family/visitor present Nurse Communication: Mobility status;Precautions PT Visit Diagnosis: Unsteadiness on feet (R26.81);Muscle weakness (generalized) (M62.81);Difficulty in walking, not elsewhere classified (R26.2)    Time: 3643-8377 PT Time Calculation (min) (ACUTE ONLY): 29 min   Charges:   PT Evaluation $PT Eval Moderate Complexity: 1 Procedure PT Treatments $Therapeutic Activity: 8-22 mins   PT G Codes:        Elwyn Reach, PT [phone]   [name] 02/14/2017, 9:59 AM

## 2017-02-14 NOTE — Procedures (Deleted)
Intubation Procedure Note MAGNUM LUNDE [id number] 07-Oct-1936  Procedure: Intubation Indications: Respiratory insufficiency  Procedure Details Consent: Unable to consent due to emergent medical necessity  Time Out: Verified patient identification, verified procedure, site/side was marked, verified correct patient position, special equipment/implants available, medications/allergies/relevent history reviewed, required imaging and test results available.  Performed  Maximum sterile technique was used including cap, gloves, gown, hand hygiene and mask.  MAC  glidescope used etomite 63m Rocuronium 565mFentanyl 10077mVersed 2mg51mEvaluation Hemodynamic Status: Persistent hypotension treated with fluid; O2 sats: stable throughout Patient's Current Condition: stable Complications: No apparent complications Patient did tolerate procedure well. Chest X-ray ordered to verify placement.  CXR: pending.    Dr. MuraBrand MalesD., F.C.[facility] Medicine Staff Physician [facility] Pager: [phone] no answer or between  15:00h - 7:00h: call [phone]  02/14/2017 4:07 AM

## 2017-02-14 NOTE — Progress Notes (Signed)
Pharmacy Antibiotic Note  Jesus Sanders is a 81 y.o. male admitted on 02/05/2017 with intraabdominal infection, s/p OR for graham patch perforation of marginal ulcureon 03/02/2017.  Pharmacy has been consulted for vancomycin and eraxis dosing.   Today is POD#2, D#3 ABX, originally on zosyn and fluconazole. Pt clinically worsening evidenced by WBC up to 13.2 so decision made to broaden coverage. Remains afebrile, LA wnl.   Plan: D/c fluconzole Vancomycin IV 1750 mg x1, then 1000 mg q48h Eraxis 200 mg IV x1, then 100 mg q24h Pt continues on zosyn F/u WBC, Tmax, clinical picture, culture data  Narrow as able Vanc levels as needed   Height: 6' (182.9 cm) Weight: 220 lb (99.8 kg) IBW/kg (Calculated) : 77.6  Temp (24hrs), Avg:98.6 F (37 C), Min:98 F (36.7 C), Max:99 F (37.2 C)   Recent Labs Lab 02/10/2017 2030 03/05/2017 0323 02/15/2017 0624 02/13/17 0350 02/13/17 1824 02/13/17 2135 02/14/17 0315  WBC 7.5  --   --  10.7*  --   --  13.2*  CREATININE 1.24  --   --  2.61* 3.07*  --  3.33*  LATICACIDVEN  --  2.4* 2.5*  --   --  1.1  --     Estimated Creatinine Clearance: 22.4 mL/min (A) (by C-G formula based on SCr of 3.33 mg/dL (H)).    No Known Allergies   Vancomycin 4/8 > Zosyn 4/8 >> Fluconazole 4/8 >> Eraxis 4/10>  4/8 BCx: sent 4/8 MRSA PCR negative   Thank you for allowing pharmacy to be a part of this patient's care.  Carlean Jews, Pharm.D. PGY1 Pharmacy Resident 4/10/201812:04 PM Pager [phone]

## 2017-02-14 NOTE — Anesthesia Postprocedure Evaluation (Signed)
Anesthesia Post Note  Patient: Jesus Sanders  Procedure(s) Performed: Procedure(s) (LRB): REPAIR OF PERFORATED ULCER (N/A)  Patient location during evaluation: PACU Anesthesia Type: General Level of consciousness: sedated and patient remains intubated per anesthesia plan Pain management: pain level controlled Vital Signs Assessment: post-procedure vital signs reviewed and stable Respiratory status: patient remains intubated per anesthesia plan and patient on ventilator - see flowsheet for VS Cardiovascular status: stable Anesthetic complications: no       Last Vitals:  Vitals:   02/14/17 1954 02/14/17 2000  BP:  (!) 94/56  Pulse:  68  Resp:  17  Temp: 36.7 C     Last Pain:  Vitals:   02/14/17 2000  TempSrc:   PainSc: Asleep                 Nolon Nations

## 2017-02-14 NOTE — Procedures (Signed)
Intubation Procedure Note ADRON GEISEL [id number] Jan 26, 1936  Procedure: Intubation Indications: Respiratory insufficiency  Procedure Details Consent: Unable to obtain consent because of emergent medical necessity. Time Out: Verified patient identification, verified procedure, site/side was marked, verified correct patient position, special equipment/implants available, medications/allergies/relevent history reviewed, required imaging and test results available.  Performed  Maximum sterile technique was used including antiseptics, cap, gloves, gown, hand hygiene and mask.  MAC and 3 4    Evaluation Hemodynamic Status: Transient hypotension treated with fluid; O2 sats: stable throughout Patient's Current Condition: stable Complications: No apparent complications Patient did tolerate procedure well. Chest X-ray ordered to verify placement.  CXR: pending.   Raylene Miyamoto. 02/14/2017  Mild mucous Glide  Lavon Paganini. Titus Mould, MD, [place] Pgr: [facility]

## 2017-02-14 NOTE — Progress Notes (Signed)
Increased agitated, yelling, trying to get out of bed and pulling foley. ELINk notified. Orders placed for ativan and precedex. 0.5 of Ativan given with relief. Will continue to monitor for further need of precedex.

## 2017-02-14 NOTE — Progress Notes (Signed)
2 Days Post-Op  Subjective: reintubated, sedated  Objective: Vital signs in last 24 hours: Temp:  [98 F (36.7 C)-99 F (37.2 C)] 98.9 F (37.2 C) (04/10 0821) Pulse Rate:  [88-132] 88 (04/10 0950) Resp:  [13-30] 18 (04/10 0950) BP: (55-136)/(40-77) 55/40 (04/10 0950) SpO2:  [77 %-100 %] 100 % (04/10 0950) FiO2 (%):  [100 %] 100 % (04/10 0950)    Intake/Output from previous day: 04/09 0701 - 04/10 0700 In: 4924.6 [I.V.:3649.6; IV Piggyback:1275] Out: 2545 [Urine:1745; Emesis/NG output:325; Drains:475] Intake/Output this shift: Total I/O In: -  Out: 150 [Urine:150]  GI: soft wound clean drains both serous  Lab Results:   Recent Labs  02/13/17 0350 02/14/17 0315  WBC 10.7* 13.2*  HGB 15.0 15.3  HCT 47.8 48.4  PLT 79* 94*   BMET  Recent Labs  02/13/17 1824 02/14/17 0315  NA 143 140  K 4.7 4.2  CL 112* 108  CO2 20* 23  GLUCOSE 123* 109*  BUN 40* 41*  CREATININE 3.07* 3.33*  CALCIUM 8.0* 8.2*   PT/INR No results for input(s): LABPROT, INR in the last 72 hours. ABG  Recent Labs  02/10/2017 1518 02/14/17 0922  PHART 7.263* 7.224*  HCO3 22.1 18.9*    Studies/Results: Dg Chest Port 1 View  Result Date: 02/14/2017 CLINICAL DATA:  Respiratory failure EXAM: PORTABLE CHEST 1 VIEW COMPARISON:  Portable chest x-ray of 02/15/2017 FINDINGS: The lungs are poorly aerated. There is more opacity in the right mid lung and right lung base suspicious for patchy pneumonia. The previously noted mass within the left upper lobe is obscured by the overlying pacemaker battery pack. The endotracheal tube has been removed. Right central venous line tip overlies the upper SVC. Cardiomegaly is stable and AICD lead remains IMPRESSION: 1. Poor inspiration. 2. More opacity in the right mid lung and right lung base suspicious for pneumonia. 3. The previously noted mass in the left upper lobe is obscured by the pacemaker battery pack. Electronically Signed   By: Ivar Drape M.D.   On:  02/14/2017 09:23   Dg Chest Port 1 View  Result Date: 03/03/2017 CLINICAL DATA:  Endotracheal and central line placement EXAM: PORTABLE CHEST 1 VIEW COMPARISON:  02/21/2017 CXR FINDINGS: Endotracheal tube tip is 4.9 cm above the carina in satisfactory position. Right IJ central line catheter terminates in the mid SVC level. Left-sided defibrillator apparatus obscures the spiculated left upper lobe mass seen previously. Leads project within the right atrium and right ventricle off the ICD device. Stable cardiomegaly with aortic atherosclerosis. Central vascular congestion is noted without pneumothorax. Probable small bilateral pleural effusions. Retrocardiac opacities likely representing areas of atelectasis of CHF. Superimposed pneumonia would be difficult to entirely exclude. IMPRESSION: 1. Satisfactory support line and tube positions. No pneumothorax post IJ central line catheter placement. 2. Cardiomegaly with aortic atherosclerosis mild central vascular congestion consistent with CHF. Retrocardiac atelectasis CHF noted. Superimposed pneumonia would be difficult to entirely exclude. Small bilateral pleural effusions. 3. Spiculated mass in the left upper lobe is obscured by the ICD device. Electronically Signed   By: Ashley Royalty M.D.   On: 02/20/2017 20:07    Anti-infectives: Anti-infectives    Start     Dose/Rate Route Frequency Ordered Stop   02/15/17 1000  anidulafungin (ERAXIS) 100 mg in sodium chloride 0.9 % 100 mL IVPB     100 mg over 90 Minutes Intravenous Every 24 hours 02/14/17 0943     02/14/17 1000  vancomycin (VANCOCIN) 1,750 mg in sodium chloride  0.9 % 500 mL IVPB     1,750 mg 250 mL/hr over 120 Minutes Intravenous  Once 02/14/17 0948     02/14/17 0945  anidulafungin (ERAXIS) 200 mg in sodium chloride 0.9 % 200 mL IVPB     200 mg over 180 Minutes Intravenous  Once 02/14/17 0943     02/13/17 1330  fluconazole (DIFLUCAN) IVPB 200 mg  Status:  Discontinued     200 mg 100 mL/hr over 60  Minutes Intravenous Every 24 hours 02/13/17 0900 02/14/17 0943   02/15/2017 1400  piperacillin-tazobactam (ZOSYN) IVPB 3.375 g     3.375 g 12.5 mL/hr over 240 Minutes Intravenous Every 8 hours 02/10/2017 1329     03/05/2017 1400  vancomycin (VANCOCIN) IVPB 750 mg/150 ml premix  Status:  Discontinued     750 mg 150 mL/hr over 60 Minutes Intravenous Every 12 hours 02/16/2017 1329 02/13/17 0900   02/20/2017 1330  fluconazole (DIFLUCAN) IVPB 400 mg  Status:  Discontinued     400 mg 100 mL/hr over 120 Minutes Intravenous Every 24 hours 03/04/2017 1254 02/13/17 0900   03/02/2017 1230  fluconazole (DIFLUCAN) IVPB 100 mg  Status:  Discontinued     100 mg 50 mL/hr over 60 Minutes Intravenous Every 24 hours 02/22/2017 1228 03/03/2017 1253   02/10/2017 0130  vancomycin (VANCOCIN) IVPB 1000 mg/200 mL premix     1,000 mg 200 mL/hr over 60 Minutes Intravenous  Once 02/10/2017 0118 03/01/2017 0235   02/13/2017 0115  piperacillin-tazobactam (ZOSYN) IVPB 3.375 g     3.375 g 100 mL/hr over 30 Minutes Intravenous  Once 02/05/2017 0108 03/01/2017 0134      Assessment/Plan: POD 2 graham patch perforation of marginal ulcer- Dr Brantley Stage  1. Appreciate ccm assistance with mgt, remain on mech vent today 2. Continue protonix bid, ng tube (do not manipulate), npo- will eventually do ugi in several days to assess repair prior to any oral intake 3. Bid dressing changes 4. Ok to start proph lovenox from surgical standpoint if ok with icu  [facility] 02/14/2017

## 2017-02-14 NOTE — Progress Notes (Signed)
Patient Name: Jesus Sanders      SUBJECTIVE: Admitted with a bowel perforation and wide-complex tachycardia currently intubated on pressor support  Past Medical History:  Diagnosis Date  . AAA (abdominal aortic aneurysm) ([place])    a. CT 07/2015 - mild aneurysmal dilatation of the distal abdominal aorta measuring 3.2 x 3.4 cm.  Marland Kitchen CAD (coronary artery disease)    a. 07/2011 Anterior apical STEMI/Cath/PCI: LM nl, LAD 100p/m (2.5 x 30m Mini-Vision BMS), LCX 40p, RCA dominant, nl, EF 30%.  . Chronic bronchitis   . Chronic respiratory failure ([place]   . COPD (chronic obstructive pulmonary disease) ([place]   . Dilatation of thoracic aorta ([place]    a. CT 07/2015 - aneurysmal dilatation of the descending thoracic aorta measuring 3.9 cm in greatest diameter.  . Duodenal perforation ([facility] June 2012  . GERD (gastroesophageal reflux disease)   . History of DVT (deep vein thrombosis)   . Hypertension   . Ischemic cardiomyopathy    a. 01/2012 S/P MDT Protecta single lead ICD, ser # [id number]  . Myocardial infarction   . Peritonitis ([facility] June 2012  . Pneumonia April 2012  . Polycythemia vera(238.4)    a. Used to receive chronic phlebotomies until 2007, at [facility]  will restart his phlebotomies from about Mar 15 2011  . Popliteal aneurysm ([facility]   . Pulmonary embolism ([place]    a. Diagnosed 07/2015 - placed on Eliquis.  . Stomach ulcer   . Systolic CHF, chronic ([place]    a. 12/2011 Echo: EF 25-30%, mid-dist antsept/inf, apical AK, Gr 1 DD, Triv AI, Mild MR.  . Tobacco abuse    a. cigars    Scheduled Meds:  Scheduled Meds: . anidulafungin  200 mg Intravenous Once   Followed by  . [START ON 02/15/2017] anidulafungin  100 mg Intravenous Q24H  . arformoterol  15 mcg Nebulization BID  . budesonide (PULMICORT) nebulizer solution  0.5 mg Nebulization BID  . chlorhexidine gluconate (MEDLINE KIT)  15 mL Mouth Rinse BID  . Chlorhexidine Gluconate Cloth  6 each Topical Daily    . heparin subcutaneous  5,000 Units Subcutaneous Q8H  . ipratropium-albuterol  3 mL Nebulization Q6H  . mouth rinse  15 mL Mouth Rinse QID  . metoprolol  2.5 mg Intravenous Q6H  . piperacillin-tazobactam (ZOSYN)  IV  3.375 g Intravenous Q8H  . sodium chloride flush  10-40 mL Intracatheter Q12H  . sodium chloride flush  3 mL Intravenous Q12H  . [START ON 02/16/2017] vancomycin  1,000 mg Intravenous Q48H   Continuous Infusions: . amiodarone 30 mg/hr (02/14/17 0700)  . dexmedetomidine (PRECEDEX) IV infusion    . dextrose 5 % and 0.9% NaCl 100 mL/hr at 02/14/17 1200  . fentaNYL infusion INTRAVENOUS 50 mcg/hr (02/14/17 1127)  . norepinephrine (LEVOPHED) Adult infusion 5.013 mcg/min (02/14/17 1200)  . pantoprozole (PROTONIX) infusion 8 mg/hr (02/14/17 1200)  . phenylephrine (NEO-SYNEPHRINE) Adult infusion     acetaminophen **OR** acetaminophen, albuterol, fentaNYL, fentaNYL (SUBLIMAZE) injection, HYDROmorphone (DILAUDID) injection, sodium chloride flush    PHYSICAL EXAM Vitals:   02/14/17 1215 02/14/17 1219 02/14/17 1230 02/14/17 1245  BP: (!) 106/59  109/61 110/68  Pulse: 84  84 88  Resp: 18  18 20   Temp:      TempSrc:      SpO2: 100% 100% 100% 100%  Weight:      Height:       Well developed and nourished intubated sedated and nonresponsive  HENT normal Neck supple  Clear laterally Rapid but Regular rate and rhythm, 2/6 murmur  Abd-soft with active BS No Clubbing cyanosis  Skin-warm and dry    TELEMETRY: Reviewed personnally pt in Wide-complex tachycardia intermittent  with abrupt onset offset consistent with SVT. P waves. In the proximal portion of the ST segment ECG personally reviewed SINUS rhythm with right bundle branch block.  Wide-complex tachycardia. Recorded   Intake/Output Summary (Last 24 hours) at 02/14/17 1300 Last data filed at 02/14/17 1200  Gross per 24 hour  Intake          4166.39 ml  Output             2490 ml  Net          1676.39 ml     LABS: Basic Metabolic Panel:  Recent Labs Lab 02/16/2017 2030 02/10/2017 1518 02/13/17 0350 02/13/17 1824 02/14/17 0315  NA 140 139 139 143 140  K 5.2* 5.4* 4.5 4.7 4.2  CL 104  --  110 112* 108  CO2 31  --  22 20* 23  GLUCOSE 108*  --  161* 123* 109*  BUN 20  --  35* 40* 41*  CREATININE 1.24  --  2.61* 3.07* 3.33*  CALCIUM 9.0  --  7.6* 8.0* 8.2*  MG  --   --   --   --  1.7   Cardiac Enzymes: No results for input(s): CKTOTAL, CKMB, CKMBINDEX, TROPONINI in the last 72 hours. CBC:  Recent Labs Lab 02/09/2017 2030 02/16/2017 1518 02/13/17 0350 02/14/17 0315  WBC 7.5  --  10.7* 13.2*  NEUTROABS 5.8  --   --   --   HGB 16.1 17.0 15.0 15.3  HCT 51.0 50.0 47.8 48.4  MCV 88.1  --  88.5 88.3  PLT 129*  --  79* 94*   PROTIME: No results for input(s): LABPROT, INR in the last 72 hours. Liver Function Tests:  Recent Labs  02/07/2017 2030 02/13/17 0350  AST 22 36  ALT 9* 33  ALKPHOS 83 45  BILITOT 1.3* 1.1  PROT 6.3* 4.6*  ALBUMIN 3.1* 2.3*    Recent Labs  02/16/2017 2030  LIPASE 16   BNP: BNP (last 3 results)  Recent Labs  08/30/16 1055 02/06/2017 2256  BNP 78.5 107.5*    ProBNP (last 3 results) No results for input(s): PROBNP in the last 8760 hours.  D-Dimer: No results for input(s): DDIMER in the last 72 hours. Hemoglobin A1C: No results for input(s): HGBA1C in the last 72 hours. Fasting Lipid Panel: No results for input(s): CHOL, HDL, LDLCALC, TRIG, CHOLHDL, LDLDIRECT in the last 72 hours. Thyroid Function Tests: No results for input(s): TSH, T4TOTAL, T3FREE, THYROIDAB in the last 72 hours.  Invalid input(s): FREET3 Anemia Panel: No results for input(s): VITAMINB12, FOLATE, FERRITIN, TIBC, IRON, RETICCTPCT in the last 72 hours.   Device Interrogation Patient's wide-complex tachycardia was confirmed as SVT. It appears to be reentered. We have programmed antitachycardia pacing in the below 200 bpm zone  ASSESSMENT AND PLAN:  Principal Problem:    Bowel perforation (HCC) Active Problems:   CAD (coronary artery disease)   Chronic systolic heart failure (HCC)   Essential hypertension   Ischemic cardiomyopathy   Automatic implantable cardioverter-defibrillator- medtronic   CAP (community acquired pneumonia)   Lactic acidosis   Pressure injury of skin   Wide-complex tachycardia ([place])  Device interrogation confirms SVT. We will discontinue the amiodarone next couple of days once his hemodynamic stresses settled  down.. His device is programmed to try to facilitate paced termination of his tachycardia.  Signed, Virl Axe MD  02/14/2017

## 2017-02-14 NOTE — Progress Notes (Signed)
[place] Progress Note Patient Name: Jesus Sanders DOB: [birth year] MRN: [id number]   Date of Service  02/14/2017  HPI/Events of Note  Periods of agitation with tachycardia, desats  eICU Interventions  Responds to ativan PRN. Will continue the same Retry low dose precedex if needed. Had issues with low BP earlier on precedex.     Intervention Category Major Interventions: Change in mental status - evaluation and management  Marysa Wessner 02/14/2017, 4:12 AM

## 2017-02-14 NOTE — Progress Notes (Signed)
PULMONARY  / CRITICAL CARE MEDICINE  Name: Jesus Sanders MRN: [id number] DOB: 01-07-1936    LOS: 2  REFERRING MD :  Dr. Nehemiah Settle  CHIEF COMPLAINT:  Abdominal pain  HPI: 81yo male with PMH of COPD on chronic oxygen supplementation (2-3L continuously per [place]), CHR (echo 10/17 EF 45-50%, G1DD), CAD, polycythemia vera, recent PE not on anticoagulation due to cost, HTN, presenting to [facility] with abdominal pain.   Initial ER evaluation notable for Na 140, K 5.2, Sr Cr 1.24, albumin 3.1, BNP 107, troponin 0, WBC 7.5, hgb 16.1 and platelets 129.  CT Renal study completed which showed inflammatory changes surrounding the loops of thickened jejunum near the gastrojejunostomy, small volume of pneumoperitoneum, new ill defined liver segment with hypodense lesion (16 mm), R posterior lobe calcified pleural plaque, 3.3 cm stable infrarenal abdominal aortic aneurysm.  Repeat CT with enteric contrast showed patency of the gastrojejunostomy but revealed a peri-anastomotic ulcer of the jejunum which directly communicated with the pneumoperitoneum.  The patient's blood pressure was intermittently low and he was treated with 1500 ml NS with improvement.  He was covered with vancomycin + zosyn.  CCS consulted and patient underwent surgery 4/8.  CXR concerning for LLL atelectasis vs infiltrate.  LINES / TUBES: ETT 4/8>>4/9 CVC 4/8>> A line 4/8>>  CULTURES: BCx x 2 4/8>>  ANTIBIOTICS: Vanco 4/8 >>4/9  Zosyn 4/8 >>  Diflucan 4/8 >>   SIGNIFICANT EVENTS:  4/08  Admit with abdominal pain, found to have a perforated gastric ulcer - surgery for patching 4/9 Extubation     INTERVAL HISTORY:  Extubated, required sedation overnight   VITAL SIGNS: Temp:  [98 F (36.7 C)-99 F (37.2 C)] 98.9 F (37.2 C) (04/10 0821) Pulse Rate:  [94-135] 94 (04/10 0800) Resp:  [13-30] 28 (04/10 0800) BP: (88-136)/(50-77) 119/71 (04/10 0800) SpO2:  [77 %-100 %] 97 % (04/10 0800) HEMODYNAMICS:   VENTILATOR SETTINGS:    INTAKE / OUTPUT: Intake/Output      04/09 0701 - 04/10 0700 04/10 0701 - 04/11 0700   I.V. (mL/kg) 3649.6 (36.6)    IV Piggyback 1275    Total Intake(mL/kg) 4924.6 (49.3)    Urine (mL/kg/hr) 1745 (0.7) 150 (0.9)   Emesis/NG output 325 (0.1)    Drains 475 (0.2)    Blood     Total Output 2545 150   Net +2379.6 -150          PHYSICAL EXAMINATION: General:  NAD pre and post extubation  Neuro:  Follows commands, required IV ativan overnight for agitation, appears lethargic HEENT:  Oral mucosa dry, mouth breathing Cardiovascular: HSR 96 b/m on amio drip 16.7 Lungs:  Decreased air movement with swallow respirations  Abdomen:  Dressing abd CD&I JP charged x 2 Skin:  WD&I lower feet cool ++dp pulses   LABS: Cbc  Recent Labs Lab 03/01/2017 2030 02/28/2017 1518 02/13/17 0350 02/14/17 0315  WBC 7.5  --  10.7* 13.2*  HGB 16.1 17.0 15.0 15.3  HCT 51.0 50.0 47.8 48.4  PLT 129*  --  79* 94*    Chemistry   Recent Labs Lab 02/13/17 0350 02/13/17 1824 02/14/17 0315  NA 139 143 140  K 4.5 4.7 4.2  CL 110 112* 108  CO2 22 20* 23  BUN 35* 40* 41*  CREATININE 2.61* 3.07* 3.33*  CALCIUM 7.6* 8.0* 8.2*  MG  --   --  1.7  GLUCOSE 161* 123* 109*    Liver fxn  Recent Labs Lab 02/27/2017 2030 02/13/17 0350  AST 22 36  ALT 9* 33  ALKPHOS 83 45  BILITOT 1.3* 1.1  PROT 6.3* 4.6*  ALBUMIN 3.1* 2.3*   coags No results for input(s): APTT, INR in the last 168 hours. Sepsis markers  Recent Labs Lab 02/09/2017 0323 02/07/2017 0624 02/13/17 2135  LATICACIDVEN 2.4* 2.5* 1.1   Cardiac markers No results for input(s): CKTOTAL, CKMB, TROPONINI in the last 168 hours. BNP No results for input(s): PROBNP in the last 168 hours. ABG  Recent Labs Lab 03/03/2017 1518  PHART 7.263*  PCO2ART 48.2*  PO2ART 104.0  HCO3 22.1  TCO2 24    CBG trend  Recent Labs Lab 02/08/2017 1921  GLUCAP 105*    IMAGING: CT Renal Study 4/8 >> inflammatory changes surrounding the loops of  thickened jejunum near the gastrojejunostomy, small volume of pneumoperitoneum, new ill defined liver segment with hypodense lesion (16 mm), R posterior lobe calcified pleural plaque, 3.3 cm stable infrarenal abdominal aortic aneurysm.   CT Abd with enteric contrast 4/8 >> showed patency of the gastrojejunostomy but revealed a peri-anastomotic ulcer of the jejunum which directly communicated with the pneumoperitoneum. 4/10 cxr>>  DIAGNOSES: Principal Problem:   Bowel perforation (HCC) Active Problems:   CAD (coronary artery disease)   Chronic systolic heart failure (HCC)   Essential hypertension   CAP (community acquired pneumonia)   Lactic acidosis   Pressure injury of skin   ASSESSMENT / PLAN:  PULMONARY A: COPD  LUL Spiculated Mass Hx PE - not on anticoagulation  Former Tobacco Abuse  Extubated 4/9 Sedation 4/10 impeding pulmonary toilet  P:   Brovana + Pulmicort  PRN albuterol  Pulmonary hygiene  OOB when OK with CCS May get re intubated  4/10 check CxR due perceived hypoventilation  CARDIOVASCULAR A:  Sepsis - in setting of perforated ulcer Resolved hypotension Hx ICM, chronic sCHF, HTN, AAA, CAD  P:  Monitor hemodynamics closely  D5NS 66m/hr Prn pressors  See ID   RENAL Lab Results  Component Value Date   CREATININE 3.33 (H) 02/14/2017   CREATININE 3.07 (H) 02/13/2017   CREATININE 2.61 (H) 02/13/2017   CREATININE 1.3 01/14/2015   CREATININE 1.3 12/10/2013   CREATININE 1.57 (H) 10/24/2012   CREATININE 1.51 (H) 10/23/2012    A:   AKI, with low urine output likely pre-renal from hypotension/hypovolemia P:   Continue IV fluids Trend BMP / urinary output Replace electrolytes as indicated Avoid nephrotoxic agents, ensure adequate renal perfusion - stopped vanc Hold home lasix  GASTROINTESTINAL A:   16 mm Hypodense Liver Lesion - new on CT, hx of spiculated LUL nodule, ?infectious vs malignant Hx GERD, duodenal perforation (2012) s/p  P:    NPO; NG tube in place   No plan as of now for liver biopsy   HEMATOLOGIC A:   Polycythemia Vera P:  Trend CBC  Begin sq heparin in setting of AKI  INFECTIOUS A:   Perforated Ulcer / Peritonitis  P:   Diflucan and zosyn D/c vanc Follow cultures   ENDOCRINE   A:   At Risk Hypo/Hyperglycemia   P:   Trend glucose  D5/NS 1232mhr NPO   NEUROLOGIC A:   Pain  Agitation P:   RASS goal: 0 Pain control per CCS 4/10 early am given ativan for agitation, this may impede his ability to remain extubated.   APP CCT 32 min   StRichardson Landryinor ACNP LeMaryanna ShapeCCM Pager [phone] 3 pm If no answer page [phone]/08/2017, 8:43 AM   STAFF  NOTE: I, Merrie Roof, MD FACP have personally reviewed patient's available data, including medical history, events of note, physical examination and test results as part of my evaluation. I have discussed with resident/NP and other care providers such as pharmacist, RN and RRT. In addition, I personally evaluated patient and elicited key findings of: worsening resp status, lethargic, elevated rr, ronchi, pcxr with worsening infiltrates, concern is hcap / asp , weak mechanics and now resp failure post extubation day prior, stat abg  / pcxr done and reviewed, re intubate now, abdo drains noted, tender but soft, re place ett, get pcxr, abg in 1 hour, MV increase on vent with Ph noted, with clinical declines and asp concerns, add ppi drip, NPO remain, may have ards, follow plat pressures, keep  Less 30 ,  cvp assessment, keep pos balance, increase fluids, broaden with declines, add vanc, change diflucan to eraxis, dc ativan (will promote delerium, ) re add fent , update family when present, repeat lactic acid level The patient is critically ill with multiple organ systems failure and requires high complexity decision making for assessment and support, frequent evaluation and titration of therapies, application of advanced monitoring technologies  and extensive interpretation of multiple databases.   Critical Care Time devoted to patient care services described in this note is 35 Minutes. This time reflects time of care of this signee: Merrie Roof, MD FACP. This critical care time does not reflect procedure time, or teaching time or supervisory time of PA/NP/Med student/Med Resident etc but could involve care discussion time. Rest per NP/medical resident whose note is outlined above and that I agree with   Lavon Paganini. Titus Mould, MD, [place] Pgr: [facility] 02/14/2017 9:41 AM

## 2017-02-15 ENCOUNTER — Inpatient Hospital Stay (HOSPITAL_COMMUNITY): Payer: Medicare Other

## 2017-02-15 DIAGNOSIS — I480 Paroxysmal atrial fibrillation: Secondary | ICD-10-CM

## 2017-02-15 DIAGNOSIS — J9601 Acute respiratory failure with hypoxia: Secondary | ICD-10-CM

## 2017-02-15 DIAGNOSIS — R579 Shock, unspecified: Secondary | ICD-10-CM

## 2017-02-15 DIAGNOSIS — I472 Ventricular tachycardia: Secondary | ICD-10-CM

## 2017-02-15 LAB — COOXEMETRY PANEL
CARBOXYHEMOGLOBIN: 1.5 % (ref 0.5–1.5)
METHEMOGLOBIN: 0.6 % (ref 0.0–1.5)
O2 Saturation: 78.9 %
Total hemoglobin: 14 g/dL (ref 12.0–16.0)

## 2017-02-15 LAB — CBC
HEMATOCRIT: 41.2 % (ref 39.0–52.0)
HEMOGLOBIN: 13 g/dL (ref 13.0–17.0)
MCH: 27.4 pg (ref 26.0–34.0)
MCHC: 31.6 g/dL (ref 30.0–36.0)
MCV: 86.9 fL (ref 78.0–100.0)
Platelets: 83 10*3/uL — ABNORMAL LOW (ref 150–400)
RBC: 4.74 MIL/uL (ref 4.22–5.81)
RDW: 17.3 % — ABNORMAL HIGH (ref 11.5–15.5)
WBC: 10.9 10*3/uL — ABNORMAL HIGH (ref 4.0–10.5)

## 2017-02-15 LAB — BASIC METABOLIC PANEL
Anion gap: 7 (ref 5–15)
BUN: 45 mg/dL — ABNORMAL HIGH (ref 6–20)
CHLORIDE: 113 mmol/L — AB (ref 101–111)
CO2: 22 mmol/L (ref 22–32)
CREATININE: 3.7 mg/dL — AB (ref 0.61–1.24)
Calcium: 8.4 mg/dL — ABNORMAL LOW (ref 8.9–10.3)
GFR calc Af Amer: 17 mL/min — ABNORMAL LOW (ref >60)
GFR calc non Af Amer: 14 mL/min — ABNORMAL LOW (ref >60)
GLUCOSE: 121 mg/dL — AB (ref 65–99)
POTASSIUM: 3.4 mmol/L — AB (ref 3.5–5.1)
Sodium: 142 mmol/L (ref 135–145)

## 2017-02-15 LAB — PHOSPHORUS: Phosphorus: 2.8 mg/dL (ref 2.5–4.6)

## 2017-02-15 LAB — MAGNESIUM: Magnesium: 1.9 mg/dL (ref 1.7–2.4)

## 2017-02-15 MED ORDER — METOPROLOL TARTRATE 5 MG/5ML IV SOLN
2.5000 mg | Freq: Four times a day (QID) | INTRAVENOUS | Status: DC
  Administered 2017-02-15 – 2017-02-24 (×26): 2.5 mg via INTRAVENOUS
  Filled 2017-02-15 (×34): qty 5

## 2017-02-15 MED ORDER — METOPROLOL TARTRATE 5 MG/5ML IV SOLN
5.0000 mg | Freq: Once | INTRAVENOUS | Status: AC
  Administered 2017-02-15: 5 mg via INTRAVENOUS

## 2017-02-15 MED ORDER — DEXTROSE 5 % IV SOLN
INTRAVENOUS | Status: DC
  Administered 2017-02-15 (×2): via INTRAVENOUS
  Filled 2017-02-15 (×4): qty 150

## 2017-02-15 MED ORDER — SODIUM CHLORIDE 0.9 % IV SOLN
30.0000 meq | Freq: Once | INTRAVENOUS | Status: AC
  Administered 2017-02-15: 30 meq via INTRAVENOUS
  Filled 2017-02-15: qty 15

## 2017-02-15 NOTE — Progress Notes (Addendum)
SUBJECTIVE: The patient is intubated and sedated. Went into AF yesterday evening.   CURRENT MEDICATIONS: . anidulafungin  100 mg Intravenous Q24H  . arformoterol  15 mcg Nebulization BID  . budesonide (PULMICORT) nebulizer solution  0.5 mg Nebulization BID  . chlorhexidine gluconate (MEDLINE KIT)  15 mL Mouth Rinse BID  . Chlorhexidine Gluconate Cloth  6 each Topical Daily  . heparin subcutaneous  5,000 Units Subcutaneous Q8H  . ipratropium-albuterol  3 mL Nebulization Q6H  . mouth rinse  15 mL Mouth Rinse QID  . metoprolol  2.5 mg Intravenous Q6H  . piperacillin-tazobactam (ZOSYN)  IV  3.375 g Intravenous Q8H  . sodium chloride flush  10-40 mL Intracatheter Q12H  . sodium chloride flush  3 mL Intravenous Q12H   . amiodarone 30 mg/hr (02/15/17 0546)  . dexmedetomidine (PRECEDEX) IV infusion    . fentaNYL infusion INTRAVENOUS 300 mcg/hr (02/15/17 0954)  . norepinephrine (LEVOPHED) Adult infusion Stopped (02/15/17 0957)  . pantoprozole (PROTONIX) infusion 8 mg/hr (02/15/17 0819)  . phenylephrine (NEO-SYNEPHRINE) Adult infusion    .  sodium bicarbonate  infusion 1000 mL 75 mL/hr at 02/15/17 0819    OBJECTIVE: Physical Exam: Vitals:   02/15/17 1100 02/15/17 1130 02/15/17 1145 02/15/17 1157  BP: (!) 88/56 (!) 75/60    Pulse: (!) 103 (!) 106    Resp: 16 15    Temp:   99.2 F (37.3 C)   TempSrc:   Oral   SpO2: 98% 99%  98%  Weight:      Height:        Intake/Output Summary (Last 24 hours) at 02/15/17 1212 Last data filed at 02/15/17 1100  Gross per 24 hour  Intake          4058.31 ml  Output             1318 ml  Net          2740.31 ml    Telemetry reveals atrial fibrillation since yesterday evening, V rates 100's  (personally reviewed)  GEN- The patient is intubated and sedated Head- normocephalic, atraumatic Eyes-  Sclera clear, conjunctiva pink Oropharynx- +ETT Neck- supple  Lungs- Clear to ausculation bilaterally, normal work of breathing Heart- Irregular  rate and rhythm  GI- soft, NT, ND, + BS Extremities- no clubbing, cyanosis, or edema Skin- no rash or lesion Psych- euthymic mood, full affect Neuro- strength and sensation are intact  LABS: Basic Metabolic Panel:  Recent Labs  02/14/17 0315 02/15/17 0306  NA 140 142  K 4.2 3.4*  CL 108 113*  CO2 23 22  GLUCOSE 109* 121*  BUN 41* 45*  CREATININE 3.33* 3.70*  CALCIUM 8.2* 8.4*  MG 1.7 1.9  PHOS  --  2.8   Liver Function Tests:  Recent Labs  02/13/17 0350  AST 36  ALT 33  ALKPHOS 45  BILITOT 1.1  PROT 4.6*  ALBUMIN 2.3*   No results for input(s): LIPASE, AMYLASE in the last 72 hours. CBC:  Recent Labs  02/14/17 0315 02/15/17 0306  WBC 13.2* 10.9*  HGB 15.3 13.0  HCT 48.4 41.2  MCV 88.3 86.9  PLT 94* 83*    ASSESSMENT AND PLAN:  Principal Problem:   Bowel perforation (HCC) Active Problems:   CAD (coronary artery disease)   Chronic systolic heart failure (HCC)   Essential hypertension   Ischemic cardiomyopathy   Automatic implantable cardioverter-defibrillator- medtronic   CAP (community acquired pneumonia)   Lactic acidosis   Pressure injury of skin  Wide-complex tachycardia ([place])   1.  SVT [place] improved with amio Device reprogrammed  2. Atrial fibrillation New this admission and likely 2/2 acute medical illness Continue IV amiodarone> Will discuss with Dr Elsworth Soho benefit of DCCV to see if would improve hemodynamics Will need at least short term [place], was not taking at home 2/2 cost, may need to consider Warfarin. Would start Heparin when ok with surgery/primary team [place] is at least [place], NP 02/15/2017 12:16 PM  As above  Spoke with family "good friend" Increase amio to try and improve rate control BP precludes BB,  Now back on norepi because of hypotension  Worsening renal function may be 2/2 hemodynamics and the question is whether he would hold sinus if cardioverted  We are in the 24 hour window, so now would be  the time Part of the issue is whether from surgery perspective can be anticoagulated  Is on SQ heparin now ( w their approval)  Will try and reach out to Dr Elsworth Soho -- have called and left message   Will send coox>>[phone]  Consistent with sepsis contributing to shock

## 2017-02-15 NOTE — Progress Notes (Signed)
RT note- Dr. Elsworth Soho changed VT this morning to 642m

## 2017-02-15 NOTE — Progress Notes (Signed)
3 Days Post-Op  Subjective: Sedated, responsive  Objective: Vital signs in last 24 hours: Temp:  [98.1 F (36.7 C)-99.3 F (37.4 C)] 98.9 F (37.2 C) (04/11 0825) Pulse Rate:  [57-133] 94 (04/11 0700) Resp:  [14-24] 18 (04/11 0700) BP: (55-132)/(40-97) 81/66 (04/11 0700) SpO2:  [96 %-100 %] 97 % (04/11 0816) FiO2 (%):  [40 %-100 %] 40 % (04/11 0816) Weight:  [170.6 kg (376 lb)] 170.6 kg (376 lb) (04/11 0545)    Intake/Output from previous day: 04/10 0701 - 04/11 0700 In: 3755.4 [I.V.:3575.4; IV Piggyback:50] Out: 3557 [Urine:1320; Drains:113] Intake/Output this shift: No intake/output data recorded.  Resp: end exp wheezes occ, coarse breath sounds Cardio: tachy GI: wound clean some bs drains serous approp tender  Lab Results:   Recent Labs  02/14/17 0315 02/15/17 0306  WBC 13.2* 10.9*  HGB 15.3 13.0  HCT 48.4 41.2  PLT 94* 83*   BMET  Recent Labs  02/14/17 0315 02/15/17 0306  NA 140 142  K 4.2 3.4*  CL 108 113*  CO2 23 22  GLUCOSE 109* 121*  BUN 41* 45*  CREATININE 3.33* 3.70*  CALCIUM 8.2* 8.4*   PT/INR No results for input(s): LABPROT, INR in the last 72 hours. ABG  Recent Labs  02/14/17 0922 02/14/17 1143  PHART 7.224* 7.240*  HCO3 18.9* 19.3*    Studies/Results: Dg Chest Port 1 View  Result Date: 02/15/2017 CLINICAL DATA:  Respiratory failure, history of CHF, coronary artery disease, cardiomyopathy, smoker; status post perforated ulcer repair 3 days ago. EXAM: PORTABLE CHEST 1 VIEW COMPARISON:  Portable chest x-ray of February 14, 2017 FINDINGS: The lungs are adequately inflated. The interstitial markings remain increased bilaterally. The cardiac silhouette and is enlarged and the pulmonary vascularity is engorged. There is calcification in the wall of the aortic arch. The ICD is in stable position. The endotracheal tube tip lies 6 cm above the carina. The esophagogastric tube proximal port projects above the GE junction by approximately 4 5 cm.  The right internal jugular venous catheter tip projects over the proximal SVC. IMPRESSION: COPD with superimposed mild CHF. There is bibasilar atelectasis or pneumonia. There are small bilateral pleural effusions. There has not been dramatic interval change since the previous study. Advancement of the nasogastric tube by 10-15 cm is recommended to assure that the proximal port lies below the GE junction. The other support tubes and devices are in reasonable position. Thoracic aortic atherosclerosis. Electronically Signed   By: David  Martinique M.D.   On: 02/15/2017 07:11   Dg Chest Port 1 View  Result Date: 02/14/2017 CLINICAL DATA:  Respiratory failure, post intubation EXAM: PORTABLE CHEST 1 VIEW COMPARISON:  02/14/2017 FINDINGS: Cardiomediastinal silhouette is stable. Persistent bilateral lower lobe hazy opacity suspicious for infiltrate/pneumonia. No convincing pulmonary edema. Endotracheal tube in place with tip 5 cm above the carina. No pneumothorax. NG tube in place. Single lead cardiac pacemaker is unchanged in position. Stable right IJ central line with tip in SVC. Probable small left pleural effusion. IMPRESSION: Persistent bilateral lower lobe hazy opacity suspicious for infiltrate/pneumonia. No convincing pulmonary edema. Endotracheal tube in place with tip 5 cm above the carina. No pneumothorax. NG tube in place. Single lead cardiac pacemaker is unchanged in position. Stable right IJ central line with tip in SVC. Probable small left pleural effusion. Electronically Signed   By: Lahoma Crocker M.D.   On: 02/14/2017 12:12   Dg Chest Port 1 View  Result Date: 02/14/2017 CLINICAL DATA:  Respiratory failure EXAM: PORTABLE  CHEST 1 VIEW COMPARISON:  Portable chest x-ray of 02/08/2017 FINDINGS: The lungs are poorly aerated. There is more opacity in the right mid lung and right lung base suspicious for patchy pneumonia. The previously noted mass within the left upper lobe is obscured by the overlying pacemaker  battery pack. The endotracheal tube has been removed. Right central venous line tip overlies the upper SVC. Cardiomegaly is stable and AICD lead remains IMPRESSION: 1. Poor inspiration. 2. More opacity in the right mid lung and right lung base suspicious for pneumonia. 3. The previously noted mass in the left upper lobe is obscured by the pacemaker battery pack. Electronically Signed   By: Ivar Drape M.D.   On: 02/14/2017 09:23    Anti-infectives: Anti-infectives    Start     Dose/Rate Route Frequency Ordered Stop   02/16/17 1000  vancomycin (VANCOCIN) IVPB 1000 mg/200 mL premix     1,000 mg 200 mL/hr over 60 Minutes Intravenous Every 48 hours 02/14/17 1159     02/15/17 1700  anidulafungin (ERAXIS) 100 mg in sodium chloride 0.9 % 100 mL IVPB     100 mg over 90 Minutes Intravenous Every 24 hours 02/14/17 0943     02/14/17 1000  vancomycin (VANCOCIN) 1,750 mg in sodium chloride 0.9 % 500 mL IVPB     1,750 mg 250 mL/hr over 120 Minutes Intravenous  Once 02/14/17 0948 02/14/17 1229   02/14/17 0945  anidulafungin (ERAXIS) 200 mg in sodium chloride 0.9 % 200 mL IVPB     200 mg over 180 Minutes Intravenous  Once 02/14/17 0943 02/14/17 2025   02/13/17 1330  fluconazole (DIFLUCAN) IVPB 200 mg  Status:  Discontinued     200 mg 100 mL/hr over 60 Minutes Intravenous Every 24 hours 02/13/17 0900 02/14/17 0943   02/17/2017 1400  piperacillin-tazobactam (ZOSYN) IVPB 3.375 g     3.375 g 12.5 mL/hr over 240 Minutes Intravenous Every 8 hours 03/01/2017 1329     03/01/2017 1400  vancomycin (VANCOCIN) IVPB 750 mg/150 ml premix  Status:  Discontinued     750 mg 150 mL/hr over 60 Minutes Intravenous Every 12 hours 03/02/2017 1329 02/13/17 0900   02/15/2017 1330  fluconazole (DIFLUCAN) IVPB 400 mg  Status:  Discontinued     400 mg 100 mL/hr over 120 Minutes Intravenous Every 24 hours 03/02/2017 1254 02/13/17 0900   02/20/2017 1230  fluconazole (DIFLUCAN) IVPB 100 mg  Status:  Discontinued     100 mg 50 mL/hr over 60  Minutes Intravenous Every 24 hours 03/03/2017 1228 02/17/2017 1253   02/17/2017 0130  vancomycin (VANCOCIN) IVPB 1000 mg/200 mL premix     1,000 mg 200 mL/hr over 60 Minutes Intravenous  Once 03/04/2017 0118 02/19/2017 0235   03/06/2017 0115  piperacillin-tazobactam (ZOSYN) IVPB 3.375 g     3.375 g 100 mL/hr over 30 Minutes Intravenous  Once 02/25/2017 0108 02/13/2017 0134      Assessment/Plan: POD 3 graham patch perforation of marginal ulcer- Dr Brantley Stage  1. Appreciate ccm assistance with mgt 2. Continue protonix bid, ng tube will advance 6 cm today, npo- will eventually do ugi in several days to assess repair prior to any oral intake, may need tna if going to be longer 3. Bid dressing changes 4. subq heparin   Bevin Das 02/15/2017

## 2017-02-15 NOTE — Progress Notes (Signed)
Sputum collected and sent to lab per RRT.

## 2017-02-15 NOTE — Care Management Note (Addendum)
Case Management Note  Patient Details  Name: Jesus Sanders MRN: [id number] Date of Birth: 20-Jan-1936  Subjective/Objective:      Pt admitted with bowel perf  Action/Plan:   PTA from home on oxygen 2-3 liters [place].  Pt had to be intubated yesterday 02/14/17 - no family at bedside.  CSW consulted per SNF recommendation.  CM will continue to follow for discharge needs   Expected Discharge Date:   (unknown)               Expected Discharge Plan:     In-House Referral:     Discharge planning Services  CM Consult  Post Acute Care Choice:    Choice offered to:     DME Arranged:    DME Agency:     HH Arranged:    HH Agency:     Status of Service:  In process, will continue to follow  If discussed at Long Length of Stay Meetings, dates discussed:    Additional Comments:  Maryclare Labrador, RN 02/15/2017, 2:28 PM

## 2017-02-15 NOTE — Progress Notes (Addendum)
PULMONARY  / CRITICAL CARE MEDICINE  Name: Jesus Sanders MRN: [id number] DOB: 05-18-1936    LOS: 79  REFERRING MD :  Dr. Nehemiah Settle  CHIEF COMPLAINT:  Abdominal pain  HPI: 81yo male with PMH of COPD on chronic oxygen supplementation (2-3L [place] continuously), combCHF (echo 10/17 EF 45-50%, G1DD), CAD, polycythemia vera, recent PE not on anticoagulation due to cost, HTN, presenting to [facility] with abdominal pain. Found to have peritonitis + bowel perf 2/2 ulceration at pre-existing gastrojejunostomy. Closure 4/8 with plans to re-image with UGI prior to d/c NPO. On Zosyn + Vanc. Was on fluconazole -->Eraxis.  Course complicated by new wide-complex tachycardia (SVT) on Amiodarone. He has pacer which has been adjusted for antitachycardia pacing below 200bpm.   LINES / TUBES: ETT 4/8>>4/9 CVC 4/8>> A line 4/8>>  CULTURES: BCx x 2 4/8>> NGTD Trach asp 4/11 pend  ANTIBIOTICS: Vanco 4/8 >>4/9  Zosyn 4/8 >>  Diflucan 4/8 >> 4/10 Eraxis (anidulafungin) 4/10 >  IMAGING STUDIES: 4/7 CXR: Patchy LLL opacity concerning for PNA. Left upper lobe spiculated mass 4/8 CT Renal: Inflammatory changes around gastrojejunostomy. Small volume pneumoperitoneum indicating perforation. New liver segment, possibly mets. 3.3 stable infrarenal AAA.  4/8 CT abd/pel: Perianastaotic ulcer of jejunum 4/10 CXR: Opacities in right mid and lower lung concerning for PNA.   SIGNIFICANT EVENTS:  4/08  Admit with abdominal pain, found to have a perforated gastric ulcer - surgery for patching. Also with SVT, started on AMIO 4/9 Extubation 4/10 Re-intubated.  INTERVAL HISTORY: POD#3, ABxD#4 Antimicrobials broadened yest due to worsening leukocytosis + resp decom + worsening pulm infiltrates.  Re-intubated 4/11.   VITAL SIGNS: Temp:  [98.1 F (36.7 C)-99.3 F (37.4 C)] 99 F (37.2 C) (04/11 0427) Pulse Rate:  [57-133] 100 (04/11 0530) Resp:  [14-28] 18 (04/11 0530) BP: (55-132)/(40-97) 85/58 (04/11 0530) SpO2:  [95  %-100 %] 97 % (04/11 0530) FiO2 (%):  [40 %-100 %] 40 % (04/11 0322) Weight:  [376 lb (170.6 kg)] 376 lb (170.6 kg) (04/11 0545) HEMODYNAMICS: CVP:  [3 mmHg-11 mmHg] 4 mmHg VENTILATOR SETTINGS: Vent Mode: PRVC FiO2 (%):  [40 %-100 %] 40 % Set Rate:  [18 bmp] 18 bmp Vt Set:  [620 mL] 620 mL PEEP:  [5 cmH20] 5 cmH20 Plateau Pressure:  [20 cmH20-21 cmH20] 21 cmH20 INTAKE / OUTPUT: Intake/Output      04/10 0701 - 04/11 0700   I.V. (mL/kg) 3295.8 (19.3)   Other 120   IV Piggyback 50   Total Intake(mL/kg) 3465.8 (20.3)   Urine (mL/kg/hr) 1320 (0.3)   Drains 113 (0)   Total Output 1433   Net +2032.8        PHYSICAL EXAMINATION: General: Sedated but arousable. On vent. Not distressed.  Neuro: Follows commands HEENT:  +ETT, +NG Cardiovascular: Irregular and tachy to 120's.  Lungs: Coarse throughout.  Abdomen:  I JP drain present, draining. Skin:  WD&I lower feet cool ++dp pulses  LABS: Cbc  Recent Labs Lab 02/13/17 0350 02/14/17 0315 02/15/17 0306  WBC 10.7* 13.2* 10.9*  HGB 15.0 15.3 13.0  HCT 47.8 48.4 41.2  PLT 79* 94* 83*   Chemistry   Recent Labs Lab 02/13/17 1824 02/14/17 0315 02/15/17 0306  NA 143 140 142  K 4.7 4.2 3.4*  CL 112* 108 113*  CO2 20* 23 22  BUN 40* 41* 45*  CREATININE 3.07* 3.33* 3.70*  CALCIUM 8.0* 8.2* 8.4*  MG  --  1.7 1.9  PHOS  --   --  2.8  GLUCOSE 123* 109* 121*   Liver fxn  Recent Labs Lab 02/27/2017 2030 02/13/17 0350  AST 22 36  ALT 9* 33  ALKPHOS 83 45  BILITOT 1.3* 1.1  PROT 6.3* 4.6*  ALBUMIN 3.1* 2.3*   coags No results for input(s): APTT, INR in the last 168 hours. Sepsis markers  Recent Labs Lab 02/13/17 2135 02/14/17 1147 02/14/17 1411  LATICACIDVEN 1.1 0.8 0.9   Cardiac markers No results for input(s): CKTOTAL, CKMB, TROPONINI in the last 168 hours. BNP No results for input(s): PROBNP in the last 168 hours. ABG  Recent Labs Lab 02/21/2017 1518 02/14/17 0922 02/14/17 1143  PHART 7.263* 7.224*  7.240*  PCO2ART 48.2* 46.0 45.0  PO2ART 104.0 58.0* 215.0*  HCO3 22.1 18.9* 19.3*  TCO2 '24 20 21   '$ CBG trend  Recent Labs Lab 02/15/2017 1921  GLUCAP 105*   DIAGNOSES: Principal Problem:   Bowel perforation (HCC) Active Problems:   CAD (coronary artery disease)   Chronic systolic heart failure (HCC)   Essential hypertension   Ischemic cardiomyopathy   Automatic implantable cardioverter-defibrillator- medtronic   CAP (community acquired pneumonia)   Lactic acidosis   Pressure injury of skin   Wide-complex tachycardia (HCC)  ASSESSMENT / PLAN:  PULMONARY A: COPD  LUL Spiculated Mass RLL + RML infiltrate, s/p PNA. HCAP vs asp? Hx PE - not on anticoagulation  Former Tobacco Abuse  Extubated 4/9 Intubated 4/10 ABG 4/11: pH 7.24 P:   Remains on vent Brovana + Pulmicort  PRN albuterol  Pulmonary hygiene  Obtained trach asp, pending. Cont broad spec: IV Zosyn, Vanc, Eraxis. Consider d/c Vanc in setting of worsening AKI  CARDIOVASCULAR A:  Sepsis - in setting of perforated ulcer + PNA Hypotension persists, on Levophed, 7.9. Wide complex tachycardia, irregular. On Amiodarone.  Hx ICM, chronic sCHF, HTN, AAA, CAD  P:  Monitor hemodynamics closely, continues to require levophed. D5NS 67m/hr Prn pressors  See ID  EP on board, appreciate recs.   RENAL Lab Results  Component Value Date   CREATININE 3.70 (H) 02/15/2017   CREATININE 3.33 (H) 02/14/2017   CREATININE 3.07 (H) 02/13/2017   CREATININE 1.3 01/14/2015   CREATININE 1.3 12/10/2013   CREATININE 1.57 (H) 10/24/2012   CREATININE 1.51 (H) 10/23/2012  A:   AKI, with low urine output likely pre-renal from hypotension/hypovolemia Hypokalemia, 3.4. Replaced IV.  Cr continues to rise. S/p multiple contrast loads + Vanc + Hypotension.  + 8.8 L since admission P:   Continue IV fluids Trend BMP / urinary output Replace electrolytes as indicated, have ordered IV K 434m Avoid nephrotoxic agents, ensure  adequate renal perfusion -- consider d/c vanc Hold home lasix IVF: D5 ns @ 10063mr  GASTROINTESTINAL A:   16 mm Hypodense Liver Lesion - new on CT, hx of spiculated LUL nodule, ?infectious vs malignant Hx GERD, duodenal perforation (2012) s/p gastrojejunostomy  S/p ex lap with perf ulcer patching 4/8.  P:   NPO; NG tube in place . Will return to OR for UGI prior to PO intake No plan as of now for liver biopsy  HEMATOLOGIC A:   Polycythemia Vera Leukocytosis 10.9, improved.  Not anemic P:  Trend CBC  SQ heparin in setting of AKI  INFECTIOUS A:   Perforated Ulcer / Peritonitis s/p repair. RML + LLL PNA, HCAP vs ASP? P:   Continue broad spec: Zosyn, Vanc and Eraxis.  Follow cultures Got trach asp  ENDOCRINE A:   At Risk Hypo/Hyperglycemia   P:  Trend glucose  D5/NS 149m/hr NPO  NEUROLOGIC A:   Pain  Agitation P:   RASS goal: 0 Fentanyl  Bethany Molt, D.O. IM Resident PGY 1    ATTENDING NOTE: I have personally reviewed patient's available data, including medical history, events of note, physical examination and test results as part of my evaluation. I have discussed with resident/NP and other careteam providers such as pharmacist, RN and RRT & co-ordinated with consultants. In addition, I personally evaluated patient and elicited key history of  81year old man with severe COPD on home oxygen, systolic CHF and recent pulmonary embolism with a lung mass underwent emergent laparotomy for small bowel perforation He  developed renal failure, postop required mechanical ventilation, extubated 4/9 but reintubated 4/10 for WOB/CHF  On exam-awake, denies pain, mildly distended abdomen with diffuse tenderness , serosanguineous drainage from JP drains  decreased breath sounds bilateral no rhonchi, no edema  Labs show no leukocytosis, thrombocytopenia, creatinine has increased to 3.7, mild hypokalemia  Impression/plan  Postop  Acute respiratory  failure-ventilator settings were reviewed, continue pressure support weaning at times but not ready for extubation  AKI- maybe related to hypotension, hold further Lasix, start bicarbonate drip for metabolic acidosis  Wide-complex tachycardia/SVT -EP following, beta blocker okay since no bronchospasm, continue amiodarone drip  Lung mass with liver lesion-deemed not a candidate for intervention in the past, Last office visit with Dr. wMelvyn Novasin 09/2016   Rest per NP/medical resident whose note is outlined above and that I agree with and edited in full.   The patient is critically ill with multiple organ systems failure and requires high complexity decision making for assessment and support, frequent evaluation and titration of therapies, application of advanced monitoring technologies and extensive interpretation of multiple databases. Critical Care Time devoted to patient care services described in this note independent of resident  time is 35 minutes  ARigoberto Noel MD

## 2017-02-16 ENCOUNTER — Telehealth: Payer: Self-pay | Admitting: Internal Medicine

## 2017-02-16 ENCOUNTER — Encounter (HOSPITAL_COMMUNITY): Payer: Self-pay | Admitting: *Deleted

## 2017-02-16 DIAGNOSIS — E872 Acidosis: Secondary | ICD-10-CM

## 2017-02-16 DIAGNOSIS — K352 Acute appendicitis with generalized peritonitis: Secondary | ICD-10-CM

## 2017-02-16 LAB — BASIC METABOLIC PANEL
Anion gap: 8 (ref 5–15)
BUN: 47 mg/dL — AB (ref 6–20)
CHLORIDE: 111 mmol/L (ref 101–111)
CO2: 24 mmol/L (ref 22–32)
CREATININE: 4.13 mg/dL — AB (ref 0.61–1.24)
Calcium: 8.2 mg/dL — ABNORMAL LOW (ref 8.9–10.3)
GFR, EST AFRICAN AMERICAN: 15 mL/min — AB (ref >60)
GFR, EST NON AFRICAN AMERICAN: 13 mL/min — AB (ref >60)
Glucose, Bld: 107 mg/dL — ABNORMAL HIGH (ref 65–99)
POTASSIUM: 3.8 mmol/L (ref 3.5–5.1)
SODIUM: 143 mmol/L (ref 135–145)

## 2017-02-16 LAB — CBC
HEMATOCRIT: 41.1 % (ref 39.0–52.0)
Hemoglobin: 13.2 g/dL (ref 13.0–17.0)
MCH: 27.6 pg (ref 26.0–34.0)
MCHC: 32.1 g/dL (ref 30.0–36.0)
MCV: 86 fL (ref 78.0–100.0)
PLATELETS: 79 10*3/uL — AB (ref 150–400)
RBC: 4.78 MIL/uL (ref 4.22–5.81)
RDW: 17.9 % — ABNORMAL HIGH (ref 11.5–15.5)
WBC: 7.9 10*3/uL (ref 4.0–10.5)

## 2017-02-16 LAB — VANCOMYCIN, RANDOM: VANCOMYCIN RM: 22

## 2017-02-16 MED ORDER — SODIUM CHLORIDE 0.9 % IV SOLN
30.0000 meq | Freq: Once | INTRAVENOUS | Status: AC
  Administered 2017-02-16: 30 meq via INTRAVENOUS
  Filled 2017-02-16: qty 15

## 2017-02-16 MED ORDER — ORAL CARE MOUTH RINSE
15.0000 mL | Freq: Two times a day (BID) | OROMUCOSAL | Status: DC
  Administered 2017-02-16 – 2017-02-23 (×13): 15 mL via OROMUCOSAL

## 2017-02-16 MED ORDER — FENTANYL CITRATE (PF) 100 MCG/2ML IJ SOLN
25.0000 ug | INTRAMUSCULAR | Status: DC | PRN
  Administered 2017-02-16 – 2017-02-17 (×2): 50 ug via INTRAVENOUS
  Administered 2017-02-20: 75 ug via INTRAVENOUS
  Administered 2017-02-20: 50 ug via INTRAVENOUS
  Filled 2017-02-16 (×4): qty 2

## 2017-02-16 MED ORDER — HALOPERIDOL LACTATE 5 MG/ML IJ SOLN
1.0000 mg | Freq: Once | INTRAMUSCULAR | Status: AC
  Administered 2017-02-16: 1 mg via INTRAMUSCULAR
  Filled 2017-02-16: qty 1

## 2017-02-16 MED ORDER — VANCOMYCIN HCL 10 G IV SOLR
1250.0000 mg | Freq: Once | INTRAVENOUS | Status: AC
  Administered 2017-02-16: 1250 mg via INTRAVENOUS
  Filled 2017-02-16: qty 1250

## 2017-02-16 MED ORDER — FUROSEMIDE 10 MG/ML IJ SOLN
40.0000 mg | Freq: Once | INTRAMUSCULAR | Status: AC
  Administered 2017-02-16: 40 mg via INTRAVENOUS
  Filled 2017-02-16: qty 4

## 2017-02-16 NOTE — Progress Notes (Signed)
Pharmacy Antibiotic Note  Jesus Sanders is a 81 y.o. male admitted on 02/26/2017 with intraabdominal infection, s/p OR for graham patch perforation of marginal ulcer on 02/20/2017.  Pharmacy has been consulted for vancomycin and eraxis dosing.   Today is POD#4, D#5 ABX, originally on zosyn and fluconazole. Pt clinically worsened and ABX were escalated to vanc, zosyn, and eraxis.   Today, WBC down to 7.9, afebrile, LA 0.9. Culture data with no growth to date. CrCl, normalized =~15 ml/min, renal function worsening. Not on HD.   Vancomycin kinetics -Last dose of vancomycin was 1750 mg IV x1 on 4/10 @ 1030. -4/12 '@0900'$  vancomycin random level = 22. -Using population kinetics, Ke = [phone] -Expect patient to reach vancomycin random level of 17 by 4/12 @ 2330 -Maintenance dose calculated to be ~1250 mg q48h to yield steady state trough of ~17, steady state peak of ~30.   Plan: Vancomycin IV 1250 mg x1  4/12 @ 2330 Vanc random in ~48h to assess need to re-dose Continue zosyn 3.375g IV q8h (4hr EI) - dose OK for renal function Continue Eraxis 100 mg q24h Monitor WBC, Tmax, clinical picture F/u culture data, LOT Narrow as able    Height: 6' (182.9 cm) Weight: (!) 376 lb (170.6 kg) IBW/kg (Calculated) : 77.6  Temp (24hrs), Avg:98.9 F (37.2 C), Min:98.3 F (36.8 C), Max:99.2 F (37.3 C)   Recent Labs Lab 02/20/2017 2030 02/17/2017 0323 02/26/2017 0624 02/13/17 0350 02/13/17 1824 02/13/17 2135 02/14/17 0315 02/14/17 1147 02/14/17 1411 02/15/17 0306 02/16/17 0311 02/16/17 0903  WBC 7.5  --   --  10.7*  --   --  13.2*  --   --  10.9* 7.9  --   CREATININE 1.24  --   --  2.61* 3.07*  --  3.33*  --   --  3.70* 4.13*  --   LATICACIDVEN  --  2.4* 2.5*  --   --  1.1  --  0.8 0.9  --   --   --   VANCORANDOM  --   --   --   --   --   --   --   --   --   --   --  22    Estimated Creatinine Clearance: 23.9 mL/min (A) (by C-G formula based on SCr of 4.13 mg/dL (H)).    No Known  Allergies   Vancomycin 4/8 > Zosyn 4/8 >> Fluconazole 4/8 >> Eraxis 4/10>  4/8 BCx: ngtd 4/8 MRSA PCR negative  4/11 respiratory culture (TA) : ngtd  Thank you for allowing pharmacy to be a part of this patient's care.  Carlean Jews, Pharm.D. PGY1 Pharmacy Resident 4/12/201810:50 AM Pager [phone]

## 2017-02-16 NOTE — Progress Notes (Addendum)
PULMONARY  / CRITICAL CARE MEDICINE  Name: KIREE DEJARNETTE MRN: [id number] DOB: 10/02/1936    LOS: 50  REFERRING MD :  Dr. Nehemiah Settle  CHIEF COMPLAINT:  Abdominal pain  HPI: 81yo male with PMH of COPD on chronic oxygen supplementation (2-3L New  continuously), combCHF (echo 10/17 EF 45-50%, G1DD), CAD, polycythemia vera, recent PE not on anticoagulation due to cost, HTN, presenting to [facility] 4/8 with abdominal pain . Found to have peritonitis + bowel perf 2/2 ulceration at pre-existing gastrojejunostomy. Closure 4/8 with plans to re-image with UGI prior to d/c NPO. On Zosyn + Vanc. Was on fluconazole -->Eraxis.   Course complicated by new wide-complex tachycardia (SVT) on Amiodarone. He has pacer which has been adjusted for antitachycardia pacing below 200bpm.   LINES / TUBES: ETT 4/8>>4/9 CVC 4/8>> A line 4/8>>  CULTURES: BCx x 2 4/8>> NGTD Trach asp 4/11 > No organisms seen  ANTIBIOTICS: Vanco 4/8 >>4/9  Zosyn 4/8 >>  Diflucan 4/8 >> 4/10 Eraxis (anidulafungin) 4/10 >  IMAGING STUDIES: 4/7 CXR: Patchy LLL opacity concerning for PNA. Left upper lobe spiculated mass 4/8 CT Renal: Inflammatory changes around gastrojejunostomy. Small volume pneumoperitoneum indicating perforation. New liver segment, possibly mets. 3.3 stable infrarenal AAA.  4/8 CT abd/pel: Perianastaotic ulcer of jejunum 4/10 CXR: Opacities in right mid and lower lung concerning for PNA.  4/11 CXR: Bibasilar PNA with small pleural effusions  SIGNIFICANT EVENTS:  4/08  Admit with abdominal pain, found to have a perforated gastric ulcer - surgery for patching. Also with SVT, started on AMIO 4/9 Extubation 4/10 Re-intubated.  INTERVAL HISTORY: POD#4, ABxD#5 (Zosyn, Vanc). Vanc added 4/10 2/2 worsening leukocytosis and resp decomp req re-intubation.  Leukocytosis resolved today. Afebrile ON  VITAL SIGNS: Temp:  [98.3 F (36.8 C)-99.2 F (37.3 C)] 98.3 F (36.8 C) (04/11 2344) Pulse Rate:  [71-267] 71 (04/12  0500) Resp:  [0-27] 22 (04/12 0500) BP: (66-118)/(54-97) 92/63 (04/12 0500) SpO2:  [83 %-100 %] 95 % (04/12 0500) FiO2 (%):  [40 %] 40 % (04/12 0500) HEMODYNAMICS: CVP:  [8 mmHg-12 mmHg] 11 mmHg VENTILATOR SETTINGS: Vent Mode: PRVC FiO2 (%):  [40 %] 40 % Set Rate:  [18 bmp-22 bmp] 22 bmp Vt Set:  [600 mL] 600 mL PEEP:  [5 cmH20] 5 cmH20 Pressure Support:  [12 cmH20] 12 cmH20 Plateau Pressure:  [19 cmH20-21 cmH20] 20 cmH20 INTAKE / OUTPUT: Intake/Output      04/11 0701 - 04/12 0700   I.V. (mL/kg) 4031 (23.6)   IV Piggyback 557.4   Total Intake(mL/kg) 4588.4 (26.9)   Urine (mL/kg/hr) 675 (0.2)   Emesis/NG output 650 (0.2)   Drains 95 (0)   Total Output 1420   Net +3168.4        PHYSICAL EXAMINATION: General: Sedated but arousable. On vent. Not distressed.  Neuro: Follows commands HEENT:  +ETT, +NG Cardiovascular: Irregular and tachy to 120's.  Lungs: Coarse throughout.  Abdomen: JP drains with serosang fluid Skin:  WD&I lower feet cool but pulses palpable  LABS: Cbc  Recent Labs Lab 02/14/17 0315 02/15/17 0306 02/16/17 0311  WBC 13.2* 10.9* 7.9  HGB 15.3 13.0 13.2  HCT 48.4 41.2 41.1  PLT 94* 83* 79*   Chemistry  Recent Labs Lab 02/14/17 0315 02/15/17 0306 02/16/17 0311  NA 140 142 143  K 4.2 3.4* 3.8  CL 108 113* 111  CO2 23 22 24   BUN 41* 45* 47*  CREATININE 3.33* 3.70* 4.13*  CALCIUM 8.2* 8.4* 8.2*  MG 1.7 1.9  --  PHOS  --  2.8  --   GLUCOSE 109* 121* 107*  Liver fxn  Recent Labs Lab 02/23/2017 2030 02/13/17 0350  AST 22 36  ALT 9* 33  ALKPHOS 83 45  BILITOT 1.3* 1.1  PROT 6.3* 4.6*  ALBUMIN 3.1* 2.3*   coags No results for input(s): APTT, INR in the last 168 hours. Sepsis markers  Recent Labs Lab 02/13/17 2135 02/14/17 1147 02/14/17 1411  LATICACIDVEN 1.1 0.8 0.9   Cardiac markers No results for input(s): CKTOTAL, CKMB, TROPONINI in the last 168 hours. BNP No results for input(s): PROBNP in the last 168  hours. ABG  Recent Labs Lab 02/15/2017 1518 02/14/17 0922 02/14/17 1143  PHART 7.263* 7.224* 7.240*  PCO2ART 48.2* 46.0 45.0  PO2ART 104.0 58.0* 215.0*  HCO3 22.1 18.9* 19.3*  TCO2 24 20 21    CBG trend  Recent Labs Lab 02/06/2017 1921  GLUCAP 105*   DIAGNOSES: Principal Problem:   Bowel perforation (HCC) Active Problems:   CAD (coronary artery disease)   Chronic systolic heart failure (HCC)   Essential hypertension   Ischemic cardiomyopathy   Automatic implantable cardioverter-defibrillator- medtronic   CAP (community acquired pneumonia)   Lactic acidosis   Pressure injury of skin   Wide-complex tachycardia (HCC)  ASSESSMENT / PLAN:  PULMONARY A: Chronic resp failure 2/2 COPD on home O2 (2-3L) w/ hx tobacco abuse LUL Spiculated Mass, 5 cm. Not cand for resection, last saw Dr [name] 09/2016 RLL + RML infiltrate, PNA. HCAP vs asp? Trach asp without organism. Hx PE - not on anticoagulation  Extubated 4/9 Intubated 4/10 ABG 4/11: pH 7.24  P:   Remains on vent, SBT. Wean if able. Brovana + Pulmicort  PRN albuterol  Pulmonary hygiene  Cont broad spec: IV Zosyn, Eraxis.   CARDIOVASCULAR A:  Sepsis - in setting of perforated ulcer, peritonitis + PNA Hypotension persists, on NEO, 29.5. Off Levophed Wide complex tachycardia, irregular. On Amiodarone ggt Hx ICM, chronic sCHF, HTN, AAA, CAD  P:  Monitor hemodynamics closely, still req pressure support. On NEO now. Levophed d/c yest.  KVO EP on board, appreciate recs.   RENAL Lab Results  Component Value Date   CREATININE 4.13 (H) 02/16/2017   CREATININE 3.70 (H) 02/15/2017   CREATININE 3.33 (H) 02/14/2017   CREATININE 1.3 01/14/2015   CREATININE 1.3 12/10/2013   CREATININE 1.57 (H) 10/24/2012   CREATININE 1.51 (H) 10/23/2012  A:   AKI, pre-renal from hypotension/hypovolemia. Also s/p multiple contrast loads + Vanc. Urine output still low.  K 3.8 + 12L since admission Bicarb drip started yest for met  acidosis P:   ABG today to eval degree of acidosis and if can d/c bicarb Trend BMP / urinary output Replaced K IV 8mq Avoid nephrotoxic agents, ensure adequate renal perfusion  Hold home lasix  GASTROINTESTINAL A:   16 mm Hypodense Liver Lesion - new on CT, hx of spiculated LUL nodule, ?infectious vs malignant Hx GERD, duodenal perforation (2012) s/p gastrojejunostomy. 2 JP drains in place draining serosang S/p ex lap with perf ulcer patching 4/8.  P:   NPO; NG tube in place . Will return to OR for UGI prior to PO intake No plan as of now for liver biopsy  HEMATOLOGIC A:   Polycythemia Vera Leukocytosis resolved  Not anemic P:  Trend CBC  SQ heparin in setting of AKI  INFECTIOUS A:   Perforated Ulcer / Peritonitis s/p repair. RML + LLL PNA, HCAP vs ASP? P:   Continue broad spec:  Zosyn and Eraxis.   ENDOCRINE A:   At Risk Hypo/Hyperglycemia   P:   Trend glucose  NPO  NEUROLOGIC A:   Pain  Agitation P:   RASS goal: 0 Fentanyl Precedex  DISPO: SNF recommended however "friend" at bedside wants to take pt to her home on d/c.   Einar Gip, D.O. IM Resident PGY 1  Attending:  I have seen and examined the patient with nurse practitioner/resident and agree with the note above.  We formulated the plan together and I elicited the following history.    Re-intubated 4/10 after inbuation Had jejunal perf and peritonitis  Vitals:   02/16/17 0900 02/16/17 0902 02/16/17 0913 02/16/17 1000  BP: 95/64   (!) 86/64  Pulse: 68   72  Resp: (!) 22   (!) 22  Temp:      TempSrc:      SpO2: 96% 96% 96% 95%  Weight:      Height:       Vent Mode: PRVC FiO2 (%):  [40 %] 40 % Set Rate:  [18 bmp-22 bmp] 22 bmp Vt Set:  [600 mL] 600 mL PEEP:  [5 cmH20] 5 cmH20 Pressure Support:  [12 cmH20] 12 cmH20 Plateau Pressure:  [19 cmH20-21 cmH20] 20 cmH20  Intake/Output Summary (Last 24 hours) at 02/16/17 1105 Last data filed at 02/16/17 1000  Gross per 24 hour  Intake           5089.34 ml  Output             1610 ml  Net          3479.34 ml    General:  Resting comfortably in bed HENT: NCAT ETT in place PULM: CTA B, normal effort CV: RRR, no mgr GI: soft, nontender, dressing in place MSK: normal bulk and tone Neuro: awake, alert, no distress, MAEW, weak   BMET    Component Value Date/Time   NA 143 02/16/2017 0311   NA 144 01/14/2015 1027   K 3.8 02/16/2017 0311   K 4.2 01/14/2015 1027   CL 111 02/16/2017 0311   CO2 24 02/16/2017 0311   CO2 28 01/14/2015 1027   GLUCOSE 107 (H) 02/16/2017 0311   GLUCOSE 131 01/14/2015 1027   BUN 47 (H) 02/16/2017 0311   BUN 19.2 01/14/2015 1027   CREATININE 4.13 (H) 02/16/2017 0311   CREATININE 1.3 01/14/2015 1027   CALCIUM 8.2 (L) 02/16/2017 0311   CALCIUM 10.0 01/14/2015 1027   GFRNONAA 13 (L) 02/16/2017 0311   GFRNONAA 43 (L) 10/24/2012 1327   GFRAA 15 (L) 02/16/2017 0311   GFRAA 49 (L) 10/24/2012 1327   CXR > ? Worsening patchy airspace disease indpendently reviewed Echo 2017 LVEF 45-50%  Acute respiratory failure with hypoxemia> wean vent as able today,  Afib > metoprolol Peritonitis > per surgery, monitor drain output COPD> severe at baseline, continue bronchodilators Lung mass> likely metastatic   I called Suanne Marker daughter and explained that this is a difficult situation with a poor prognosis.  I asked them to come to the ICU so we can discuss goals of care.   My cc time [place], MD [place] [facility] Pager: [phone] Cell: [phone] After 3pm or if no response, call [phone]

## 2017-02-16 NOTE — Telephone Encounter (Signed)
Called and spoke with pts daughter [name] and she stated that the pt had surgery on a ruptured ulcer on Monday.  She stated that they tried to take the pt off of the ventilator on Tuesday but had to put him back on.  She stated that they have turned him down and the pt is doing most of the work on his own, but they told her that they need to take him off of the vent totally.  She was upset about this since they told her that his oxygen level would slowly drop once they do this.    She stated that she wanted to speak with MW before she gives them permission to do this since the pt has been seeing MW for years and MW is familiar with the pt. MW please advise if you can call and speak with the pts daughter.  thanks

## 2017-02-16 NOTE — Progress Notes (Signed)
4 Days Post-Op  Subjective: Intubated sedated  Objective: Vital signs in last 24 hours: Temp:  [98.3 F (36.8 C)-99.2 F (37.3 C)] 99.2 F (37.3 C) (04/12 0813) Pulse Rate:  [71-267] 71 (04/12 0700) Resp:  [0-27] 22 (04/12 0800) BP: (66-126)/(54-97) 106/68 (04/12 0800) SpO2:  [83 %-100 %] 98 % (04/12 0800) FiO2 (%):  [40 %] 40 % (04/12 0600)    Intake/Output from previous day: 04/11 0701 - 04/12 0700 In: 4831.4 [I.V.:4224; IV Piggyback:607.4] Out: 1450 [Urine:705; Emesis/NG output:650; Drains:95] Intake/Output this shift: Total I/O In: 386.6 [I.V.:386.6] Out: 245 [Urine:125; Emesis/NG output:100; Drains:20]  GI: wound open and granulating well, drains both serous, soft approp tender  Lab Results:   Recent Labs  02/15/17 0306 02/16/17 0311  WBC 10.9* 7.9  HGB 13.0 13.2  HCT 41.2 41.1  PLT 83* 79*   BMET  Recent Labs  02/15/17 0306 02/16/17 0311  NA 142 143  K 3.4* 3.8  CL 113* 111  CO2 22 24  GLUCOSE 121* 107*  BUN 45* 47*  CREATININE 3.70* 4.13*  CALCIUM 8.4* 8.2*   PT/INR No results for input(s): LABPROT, INR in the last 72 hours. ABG  Recent Labs  02/14/17 0922 02/14/17 1143  PHART 7.224* 7.240*  HCO3 18.9* 19.3*    Studies/Results: Dg Chest Port 1 View  Result Date: 02/15/2017 CLINICAL DATA:  Respiratory failure, history of CHF, coronary artery disease, cardiomyopathy, smoker; status post perforated ulcer repair 3 days ago. EXAM: PORTABLE CHEST 1 VIEW COMPARISON:  Portable chest x-ray of February 14, 2017 FINDINGS: The lungs are adequately inflated. The interstitial markings remain increased bilaterally. The cardiac silhouette and is enlarged and the pulmonary vascularity is engorged. There is calcification in the wall of the aortic arch. The ICD is in stable position. The endotracheal tube tip lies 6 cm above the carina. The esophagogastric tube proximal port projects above the GE junction by approximately 4 5 cm. The right internal jugular venous  catheter tip projects over the proximal SVC. IMPRESSION: COPD with superimposed mild CHF. There is bibasilar atelectasis or pneumonia. There are small bilateral pleural effusions. There has not been dramatic interval change since the previous study. Advancement of the nasogastric tube by 10-15 cm is recommended to assure that the proximal port lies below the GE junction. The other support tubes and devices are in reasonable position. Thoracic aortic atherosclerosis. Electronically Signed   By: David  Martinique M.D.   On: 02/15/2017 07:11   Dg Chest Port 1 View  Result Date: 02/14/2017 CLINICAL DATA:  Respiratory failure, post intubation EXAM: PORTABLE CHEST 1 VIEW COMPARISON:  02/14/2017 FINDINGS: Cardiomediastinal silhouette is stable. Persistent bilateral lower lobe hazy opacity suspicious for infiltrate/pneumonia. No convincing pulmonary edema. Endotracheal tube in place with tip 5 cm above the carina. No pneumothorax. NG tube in place. Single lead cardiac pacemaker is unchanged in position. Stable right IJ central line with tip in SVC. Probable small left pleural effusion. IMPRESSION: Persistent bilateral lower lobe hazy opacity suspicious for infiltrate/pneumonia. No convincing pulmonary edema. Endotracheal tube in place with tip 5 cm above the carina. No pneumothorax. NG tube in place. Single lead cardiac pacemaker is unchanged in position. Stable right IJ central line with tip in SVC. Probable small left pleural effusion. Electronically Signed   By: Lahoma Crocker M.D.   On: 02/14/2017 12:12   Dg Chest Port 1 View  Result Date: 02/14/2017 CLINICAL DATA:  Respiratory failure EXAM: PORTABLE CHEST 1 VIEW COMPARISON:  Portable chest x-ray of 02/10/2017 FINDINGS:  The lungs are poorly aerated. There is more opacity in the right mid lung and right lung base suspicious for patchy pneumonia. The previously noted mass within the left upper lobe is obscured by the overlying pacemaker battery pack. The endotracheal tube  has been removed. Right central venous line tip overlies the upper SVC. Cardiomegaly is stable and AICD lead remains IMPRESSION: 1. Poor inspiration. 2. More opacity in the right mid lung and right lung base suspicious for pneumonia. 3. The previously noted mass in the left upper lobe is obscured by the pacemaker battery pack. Electronically Signed   By: Ivar Drape M.D.   On: 02/14/2017 09:23    Anti-infectives: Anti-infectives    Start     Dose/Rate Route Frequency Ordered Stop   02/16/17 1000  vancomycin (VANCOCIN) IVPB 1000 mg/200 mL premix  Status:  Discontinued     1,000 mg 200 mL/hr over 60 Minutes Intravenous Every 48 hours 02/14/17 1159 02/15/17 1018   02/15/17 1700  anidulafungin (ERAXIS) 100 mg in sodium chloride 0.9 % 100 mL IVPB     100 mg over 90 Minutes Intravenous Every 24 hours 02/14/17 0943     02/14/17 1000  vancomycin (VANCOCIN) 1,750 mg in sodium chloride 0.9 % 500 mL IVPB     1,750 mg 250 mL/hr over 120 Minutes Intravenous  Once 02/14/17 0948 02/14/17 1229   02/14/17 0945  anidulafungin (ERAXIS) 200 mg in sodium chloride 0.9 % 200 mL IVPB     200 mg over 180 Minutes Intravenous  Once 02/14/17 0943 02/14/17 2025   02/13/17 1330  fluconazole (DIFLUCAN) IVPB 200 mg  Status:  Discontinued     200 mg 100 mL/hr over 60 Minutes Intravenous Every 24 hours 02/13/17 0900 02/14/17 0943   02/15/2017 1400  piperacillin-tazobactam (ZOSYN) IVPB 3.375 g     3.375 g 12.5 mL/hr over 240 Minutes Intravenous Every 8 hours 03/02/2017 1329     03/03/2017 1400  vancomycin (VANCOCIN) IVPB 750 mg/150 ml premix  Status:  Discontinued     750 mg 150 mL/hr over 60 Minutes Intravenous Every 12 hours 03/03/2017 1329 02/13/17 0900   02/07/2017 1330  fluconazole (DIFLUCAN) IVPB 400 mg  Status:  Discontinued     400 mg 100 mL/hr over 120 Minutes Intravenous Every 24 hours 02/28/2017 1254 02/13/17 0900   02/20/2017 1230  fluconazole (DIFLUCAN) IVPB 100 mg  Status:  Discontinued     100 mg 50 mL/hr over 60  Minutes Intravenous Every 24 hours 02/25/2017 1228 02/26/2017 1253   02/07/2017 0130  vancomycin (VANCOCIN) IVPB 1000 mg/200 mL premix     1,000 mg 200 mL/hr over 60 Minutes Intravenous  Once 03/06/2017 0118 02/22/2017 0235   02/14/2017 0115  piperacillin-tazobactam (ZOSYN) IVPB 3.375 g     3.375 g 100 mL/hr over 30 Minutes Intravenous  Once 02/09/2017 0108 02/28/2017 0134      Assessment/Plan: POD 4graham patch perforation of marginal ulcer- Dr Brantley Stage  1. Appreciate ccm assistance with mgt 2. Continue protonix bid, ng tube will advance 6 cm today, npo- will eventually do ugi in several days to assess repair prior to any oral intake, if ok with ccm would recommend starting tna today 3. Bid dressing changes 4. subq heparin 5. Overall remains ill with respiratory failure and elevated creatinine 6. Discussed with his wife  [facility] 02/16/2017

## 2017-02-16 NOTE — Progress Notes (Signed)
[facility]  I had a lengthy conversation with Suanne Marker (daughter) and her sister this afternoon. I explained that Mr. Rathgeber has had a major surgery and has persistent respiratory faiulre due to his advanced COPD and cardiac issues.  At this point his overall prognosis is poor considering his multiple comorbid illnesses and his lung mass.  Despite that his mental status and ability to wean on the vent today were surprisingly good.  So after lengthy conversation we decided to extubate today as the ventilator is not medically beneficial and there is little I can offer that will improve his chances of long term success after intubation.  They voiced understanding.  Plan: DNR Extubate Continue current level of care otherwise If he develops respiratory acute failure post extubation, then start opiates and make him full comfort measures  Roselie Awkward, MD [place] [facility] Pager: [phone] Cell: [phone] After 3pm or if no response, call [phone]

## 2017-02-16 NOTE — Telephone Encounter (Signed)
Spoke with pt's daughter [name], states that they have already taken pt off of vent.  Will close encounter.

## 2017-02-16 NOTE — Care Management Note (Signed)
Case Management Note  Patient Details  Name: Jesus Sanders MRN: [id number] Date of Birth: 1936/10/24  Subjective/Objective:      Pt admitted with bowel perf  Action/Plan:   PTA from home on oxygen 2-3 liters [place].  Pt had to be intubated yesterday 02/14/17 - no family at bedside.  CSW consulted per SNF recommendation.  CM will continue to follow for discharge needs   Expected Discharge Date:   (unknown)               Expected Discharge Plan:     In-House Referral:     Discharge planning Services  CM Consult  Post Acute Care Choice:    Choice offered to:     DME Arranged:    DME Agency:     HH Arranged:    HH Agency:     Status of Service:  In process, will continue to follow  If discussed at Long Length of Stay Meetings, dates discussed:    Additional Comments: 02/16/2017 Pt remains intubated, attending talking [place] with family ongoing - poor prognosis Maryclare Labrador, RN 02/16/2017, 12:05 PM

## 2017-02-16 NOTE — Progress Notes (Signed)
Neb treatment was given. But now terminated, pt wouldn't tolerate mask, pt tries to rip it off and tries to take out his NG tube. RRT tried blow by neb treatment administration and wouldn't tolerate it either. Again tries to pull out NG Tube. RN aware.

## 2017-02-16 NOTE — Progress Notes (Signed)
PT Cancellation Note  Patient Details Name: Jesus Sanders MRN: [id number] DOB: 11/05/1936   Cancelled Treatment:    Reason Eval/Treat Not Completed: Medical issues which prohibited therapy. Pt with decline in med status. He is now intubated and sedated. RN recommends d/c of PT until pt is more medically stable. PT signing off.  PT will need new order if/when med status improves.   Lorriane Shire 02/16/2017, 9:38 AM

## 2017-02-16 NOTE — Progress Notes (Signed)
RT note-Patient extubated per family and MD request, with no re intubation. Placed on 6lmin [place].

## 2017-02-16 NOTE — Progress Notes (Signed)
eLink Physician-Brief Progress Note Patient Name: Jesus Sanders DOB: October 21, 1936 MRN: [id number]   Date of Service  02/16/2017  HPI/Events of Note  Camera check on patient postextubation today. Appears comfortable. Nurse at bedside. Patient breathing is stable.   eICU Interventions  Continue current plan of care and monitoring.      Intervention Category Major Interventions: Respiratory failure - evaluation and management  Tera Partridge 02/16/2017, 9:22 PM

## 2017-02-16 NOTE — Telephone Encounter (Signed)
I  Reviewed his xrays and notes specifically focusing on Dr Kathee Delton impression and agree 100% with his approach especially since he is there and I'm not and he has access to my thoughts from my last note which I also reviewed  I would be happy to discuss this with her by phone if she'll leave a number and time window that would be best for her

## 2017-02-17 ENCOUNTER — Inpatient Hospital Stay (HOSPITAL_COMMUNITY): Payer: Medicare Other

## 2017-02-17 DIAGNOSIS — Z95810 Presence of automatic (implantable) cardiac defibrillator: Secondary | ICD-10-CM

## 2017-02-17 LAB — CBC
HEMATOCRIT: 42.8 % (ref 39.0–52.0)
HEMOGLOBIN: 13.6 g/dL (ref 13.0–17.0)
MCH: 27.3 pg (ref 26.0–34.0)
MCHC: 31.8 g/dL (ref 30.0–36.0)
MCV: 85.8 fL (ref 78.0–100.0)
Platelets: 78 10*3/uL — ABNORMAL LOW (ref 150–400)
RBC: 4.99 MIL/uL (ref 4.22–5.81)
RDW: 17.9 % — ABNORMAL HIGH (ref 11.5–15.5)
WBC: 7.2 10*3/uL (ref 4.0–10.5)

## 2017-02-17 LAB — CULTURE, BLOOD (ROUTINE X 2)
CULTURE: NO GROWTH
Culture: NO GROWTH
SPECIAL REQUESTS: ADEQUATE

## 2017-02-17 LAB — BASIC METABOLIC PANEL
Anion gap: 12 (ref 5–15)
BUN: 51 mg/dL — AB (ref 6–20)
CHLORIDE: 110 mmol/L (ref 101–111)
CO2: 23 mmol/L (ref 22–32)
Calcium: 8.2 mg/dL — ABNORMAL LOW (ref 8.9–10.3)
Creatinine, Ser: 4.28 mg/dL — ABNORMAL HIGH (ref 0.61–1.24)
GFR calc Af Amer: 14 mL/min — ABNORMAL LOW (ref >60)
GFR calc non Af Amer: 12 mL/min — ABNORMAL LOW (ref >60)
Glucose, Bld: 75 mg/dL (ref 65–99)
POTASSIUM: 4 mmol/L (ref 3.5–5.1)
SODIUM: 145 mmol/L (ref 135–145)

## 2017-02-17 MED ORDER — PANTOPRAZOLE SODIUM 40 MG IV SOLR
40.0000 mg | Freq: Two times a day (BID) | INTRAVENOUS | Status: DC
  Administered 2017-02-17 – 2017-02-24 (×14): 40 mg via INTRAVENOUS
  Filled 2017-02-17 (×16): qty 40

## 2017-02-17 MED ORDER — HALOPERIDOL LACTATE 5 MG/ML IJ SOLN
5.0000 mg | Freq: Four times a day (QID) | INTRAMUSCULAR | Status: DC | PRN
  Administered 2017-02-17 (×2): 5 mg via INTRAMUSCULAR
  Filled 2017-02-17 (×2): qty 1

## 2017-02-17 MED ORDER — DEXTROSE 5 % IV SOLN
2.0000 g | INTRAVENOUS | Status: AC
  Administered 2017-02-17 – 2017-02-19 (×3): 2 g via INTRAVENOUS
  Filled 2017-02-17 (×3): qty 2

## 2017-02-17 MED ORDER — MORPHINE SULFATE (PF) 2 MG/ML IV SOLN
1.0000 mg | INTRAVENOUS | Status: DC | PRN
  Administered 2017-02-17 (×2): 1 mg via INTRAVENOUS
  Filled 2017-02-17 (×2): qty 1

## 2017-02-17 NOTE — Progress Notes (Addendum)
PULMONARY  / CRITICAL CARE MEDICINE  Name: Jesus Sanders MRN: [id number] DOB: August 20, 1936    LOS: 65  REFERRING MD :  Dr. 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 Found to have peritonitis + bowel perf 2/2 ulceration at pre-existing gastrojejunostomy (2012-from duodenal perforation). Closure 4/8 with plans to re-image with UGI prior to d/c NPO. On Zosyn + Vanc. Was on fluconazole -->Eraxis.   Course complicated by new wide-complex tachycardia (SVT) on Amiodarone. He has pacer which has been adjusted for antitachycardia pacing below 200bpm.   LINES / TUBES: ETT 4/8>>4/9, ETT 4/10 >> 4/12 CVC 4/8>> A line 4/8>>  CULTURES: BCx x 2 4/8>> NGTD Trach asp 4/11 > No organisms seen  ANTIBIOTICS: Vanco 4/8 >>4/9  Zosyn 4/8 >>  Diflucan 4/8 >> 4/10 Eraxis (anidulafungin) 4/10 >  IMAGING STUDIES: 4/7 CXR: Patchy LLL opacity concerning for PNA. Left upper lobe spiculated mass 4/8 CT Renal: Inflammatory changes around gastrojejunostomy. Small volume pneumoperitoneum indicating perforation. New liver segment, possibly mets. 3.3 stable infrarenal AAA.  4/8 CT abd/pel: Perianastaotic ulcer of jejunum 4/10 CXR: Opacities in right mid and lower lung concerning for PNA.  4/11 CXR: Bibasilar PNA with small pleural effusions  SIGNIFICANT EVENTS:  4/08  Admit with abdominal pain, found to have a perforated gastric ulcer - surgery for patching. Also with SVT, started on AMIO 4/9 Extubation 4/10 Re-intubated. 4/12 [place] discussion with daughter, Suanne Marker: DNR + one-way Extubation.   INTERVAL HISTORY: POD#5, ABxD#6 (Zosyn, Vanc). Vanc added 4/10 2/2 worsening leukocytosis and resp decomp req re-intubation. Leukocytosis resolved. Afebrile ON.  Has been agitated  and disruptive to nursing staff ON.   VITAL SIGNS: Temp:  [97.9 F (36.6 C)-99.2 F (37.3 C)] 97.9 F (36.6 C) (04/12 1559) Pulse Rate:  [68-119] 113 (04/13 0600) Resp:  [17-30] 25 (04/13 0600) BP: (86-134)/(61-96) 123/94 (04/13 0500) SpO2:  [80 %-100 %] 80 % (04/13 0600) FiO2 (%):  [40 %] 40 % (04/12 1358) HEMODYNAMICS: CVP:  [12 mmHg-13 mmHg] 13 mmHg VENTILATOR SETTINGS: OFF VENT INTAKE / OUTPUT: Net +12L since admission Diuresed -3.3L yesterday for net of -325 mL   PHYSICAL EXAMINATION: General: Awake, alert and interactive. Yelling often. Agitated.  Neuro: Follows commands Cardiovascular: Irregular and tachy to 100's Lungs: Coarse throughout. Breathing unlabored.  Abdomen: JP drains with serosang fluid Skin: feet cool but pulses palpable  LABS: Cbc  Recent Labs Lab 02/15/17 0306 02/16/17 0311 02/17/17 0300  WBC 10.9* 7.9 7.2  HGB 13.0 13.2 13.6  HCT 41.2 41.1 42.8  PLT 83* 79* 78*   Chemistry  Recent Labs Lab 02/14/17 0315 02/15/17 0306 02/16/17 0311 02/17/17 0300  NA 140 142 143 145  K 4.2 3.4* 3.8 4.0  CL 108 113* 111 110  CO2 '23 22 24 23  '$ BUN 41* 45* 47* 51*  CREATININE 3.33* 3.70* 4.13* 4.28*  CALCIUM 8.2* 8.4* 8.2* 8.2*  MG 1.7 1.9  --   --   PHOS  --  2.8  --   --   GLUCOSE 109* 121* 107* 75  Liver fxn  Recent Labs Lab 02/17/2017 2030 02/13/17 0350  AST 22 36  ALT 9* 33  ALKPHOS 83 45  BILITOT 1.3* 1.1  PROT 6.3* 4.6*  ALBUMIN 3.1* 2.3*   coags No results  for input(s): APTT, INR in the last 168 hours. Sepsis markers  Recent Labs Lab 02/13/17 2135 02/14/17 1147 02/14/17 1411  LATICACIDVEN 1.1 0.8 0.9   Cardiac markers No results for input(s): CKTOTAL, CKMB, TROPONINI in the last 168 hours. BNP No results for input(s): PROBNP in the last 168 hours. ABG  Recent Labs Lab 03/02/2017 1518 02/14/17 0922 02/14/17 1143  PHART 7.263* 7.224* 7.240*  PCO2ART 48.2* 46.0 45.0  PO2ART 104.0 58.0* 215.0*  HCO3 22.1 18.9* 19.3*   TCO2 '24 20 21   '$ CBG trend  Recent Labs Lab 02/05/2017 1921  GLUCAP 105*   DIAGNOSES: Principal Problem:   Bowel perforation (HCC) Active Problems:   CAD (coronary artery disease)   Chronic systolic heart failure (HCC)   Essential hypertension   Ischemic cardiomyopathy   Automatic implantable cardioverter-defibrillator- medtronic   CAP (community acquired pneumonia)   Lactic acidosis   Pressure injury of skin   Wide-complex tachycardia (HCC)  ASSESSMENT / PLAN:  PULMONARY A: Chronic resp failure 2/2 COPD on home O2 (2-3L) w/ hx tobacco abuse LUL Spiculated Mass, 5 cm. Not cand for resection, last saw Dr [name] 09/2016.  RLL + RML infiltrate, PNA. HCAP vs asp? Trach asp without organism. Hx PE - not on anticoagulation  Extubated 4/12, doing well so far on 6L [place]  P:   Weaned from vent yesterday. If patient were to decompensate, he is now DNR/DNI.  Continue broad spec abx with IV Zosyn, Vanc and Eraxis.  Brovana + Pulmicort  PRN albuterol  Pulmonary hygiene   CARDIOVASCULAR A:  Sepsis - in setting of perforated ulcer, peritonitis + PNA OFF Neo and levophed Wide complex tachycardia, irregular. On Amiodarone Hx ICM, chronic sCHF, HTN, AAA, CAD  P:  Off pressure support, continue to monitor HD Amio KVO EP on board, appreciate recs.   RENAL Lab Results  Component Value Date   CREATININE 4.28 (H) 02/17/2017   CREATININE 4.13 (H) 02/16/2017   CREATININE 3.70 (H) 02/15/2017   CREATININE 1.3 01/14/2015   CREATININE 1.3 12/10/2013   CREATININE 1.57 (H) 10/24/2012   CREATININE 1.51 (H) 10/23/2012  A:   AKI, pre-renal from hypotension/hypovolemia. Cr 4.28 today, was 4.1 yest (1.2 > 2.6 on admission). Also s/p multiple contrast loads + Vanc. Urine output picking up after lasix yesterday. Out 2.5 L yesterday, net out -325 mL yest.  K 4.0 + 12L since admission P:   Trend BMP / urinary output Avoid nephrotoxic agents, ensure adequate renal perfusion    GASTROINTESTINAL A:  16 mm Hypodense Liver Lesion - new on CT, hx of spiculated LUL nodule, ?malignant Hx GERD, duodenal perforation (2012) s/p gastrojejunostomy.  S/p ex lap with perf ulcer patching 4/8.  P:   NPO; NG tube in place.  Will return to OR within a few days for UGI prior to PO intake.  Have consulted nutrition for TF until that time.  HEMATOLOGIC A:   Polycythemia Vera Leukocytosis resolved  Not anemic P:  Trend CBC  SQ heparin in setting of AKI  INFECTIOUS A:   Perforated Ulcer / Peritonitis s/p repair. RML + LLL PNA, HCAP vs ASP? P:   Continue broad spec: Zosyn and Eraxis. D/c vanc.  ENDOCRINE A: At Risk Hypo/Hyperglycemia   P:   Trend glucose  NPO  NEUROLOGIC A:   Pain  Agitation P: Required 6 mg total Haldol ON  DISPO: SNF recommended however "friend" at bedside wants to take pt to her home on d/c. TRANSFER OUT OF ICU.  Einar Gip, D.O. IM Resident PGY 1  Attending:  I have seen and examined the patient with nurse practitioner/resident and agree with the note above.  We formulated the plan together and I elicited the following history.    Extubated yesterday Comfortable today Agitated  On exam:  Moaning Some mild respiratory distress but speaking in full sentences Some rhonchi but reasonable air movement Belly dressing in place No edema  Acute peritonitis> stop antibitoics HCAP? > add ceftriaxone for 2 more days COPD> continue bronchodilators Afib> continue amiodarone Renal failure> worsening > not a candidate for dialysis  Out of ICU today, [place] service  Code status : DNR  Roselie Awkward, MD [place] [facility] Pager: [phone] Cell: [phone] After 3pm or if no response, call [phone]

## 2017-02-17 NOTE — Progress Notes (Signed)
Pt is tachycardic HR in the 150's due to agitation. MD wants the neb to be given. Pt has decrease breath sounds throughout. SATs are stable at this time. No distress or complications noted.

## 2017-02-17 NOTE — Progress Notes (Signed)
Oxygen delivery device change per patient desaturation in the upper 80's 87-88%. Pt is now on a salter HFNC oxygen humidity provided.

## 2017-02-17 NOTE — Progress Notes (Addendum)
Pharmacy Antibiotic Note  Jesus Sanders is a 81 y.o. male admitted on 02/19/2017 with intraabdominal infection, s/p OR for graham patch perforation of marginal ulcer on 02/19/2017.  Pharmacy has been consulted for vancomycin and zosyn dosing.   Today is POD#5, D#6 ABX, originally on zosyn and fluconazole. Pt clinically worsened and ABX were escalated to vanc, zosyn, and eraxis.   Today, WBC down to 7.2, afebrile, LA normalized. Culture data with no growth to date. Renal function worsening - CrCl, normalized =~14 ml/min. Not on HD.   Vancomycin kinetics -Last dose of vancomycin was 1750 mg IV x1 on 4/10 @ 1030. -4/12 '@0900'$  vancomycin random level = 22. -Using population kinetics, Ke = [id number] -Expect patient to reach vancomycin random level of 17 by 4/12 @ 2330 -Maintenance dose calculated to be ~1250 mg q48h to yield steady state trough of ~17, steady state peak of ~30.   Plan: Vanc random ~early 4/15 AM to assess need to re-dose Continue zosyn 3.375g IV q8h (4hr EI) - dose OK for renal function Patient continues on Eraxis 100 mg q24h Monitor WBC, Tmax, clinical picture F/u culture data, LOT Narrow as able    Height: 6' (182.9 cm) Weight: (!) 376 lb (170.6 kg) IBW/kg (Calculated) : 77.6  Temp (24hrs), Avg:98.4 F (36.9 C), Min:97.9 F (36.6 C), Max:99.2 F (37.3 C)   Recent Labs Lab 03/06/2017 0323 02/28/2017 0624 02/13/17 0350 02/13/17 1824 02/13/17 2135 02/14/17 0315 02/14/17 1147 02/14/17 1411 02/15/17 0306 02/16/17 0311 02/16/17 0903 02/17/17 0300  WBC  --   --  10.7*  --   --  13.2*  --   --  10.9* 7.9  --  7.2  CREATININE  --   --  2.61* 3.07*  --  3.33*  --   --  3.70* 4.13*  --  4.28*  LATICACIDVEN 2.4* 2.5*  --   --  1.1  --  0.8 0.9  --   --   --   --   VANCORANDOM  --   --   --   --   --   --   --   --   --   --  22  --     Estimated Creatinine Clearance: 23.1 mL/min (A) (by C-G formula based on SCr of 4.28 mg/dL (H)).    No Known Allergies   Vancomycin  4/8 > Zosyn 4/8 >> Fluconazole 4/8 >> 4/10 Eraxis 4/10>  4/8 BCx: ngtd 4/8 MRSA PCR negative  4/11 respiratory culture (TA) : ngtd  Thank you for allowing pharmacy to be a part of this patient's care.  Carlean Jews, Pharm.D. PGY1 Pharmacy Resident 4/13/20188:27 AM Pager [phone]  ----------------------------------------------------------------------------- 02/17/2017 10:19 AM - ADDENDUM  Now narrowing all antibiotics to CTX for 3 more days, then D/c.   Plan: D/c vanc, zosyn, eraxis Ceftriaxone 2g IV q24h x3 days (stop date entered for 4/16) Monitor WBC, Tmax, clinical picture Pharmacy consult to dose d/c'd - please re-consult as needed

## 2017-02-17 NOTE — Progress Notes (Signed)
5 Days Post-Op  Subjective: Extubated, alert, awake  Objective: Vital signs in last 24 hours: Temp:  [97.9 F (36.6 C)-99.2 F (37.3 C)] 98.1 F (36.7 C) (04/13 0754) Pulse Rate:  [72-119] 99 (04/13 0900) Resp:  [17-30] 25 (04/13 0900) BP: (86-145)/(61-96) 140/85 (04/13 0900) SpO2:  [80 %-100 %] 95 % (04/13 0900) FiO2 (%):  [40 %] 40 % (04/12 1358)    Intake/Output from previous day: 04/12 0701 - 04/13 0700 In: 3063.1 [I.V.:2268.1; IV Piggyback:795] Out: 3330 [Urine:2515; Emesis/NG output:600; Drains:215] Intake/Output this shift: Total I/O In: 116.6 [I.V.:116.6] Out: 245 [Urine:175; Drains:70]  GI: some bs wound open and clean without infection, drains both serous, ng output very bilious  Lab Results:   Recent Labs  02/16/17 0311 02/17/17 0300  WBC 7.9 7.2  HGB 13.2 13.6  HCT 41.1 42.8  PLT 79* 78*   BMET  Recent Labs  02/16/17 0311 02/17/17 0300  NA 143 145  K 3.8 4.0  CL 111 110  CO2 24 23  GLUCOSE 107* 75  BUN 47* 51*  CREATININE 4.13* 4.28*  CALCIUM 8.2* 8.2*   PT/INR No results for input(s): LABPROT, INR in the last 72 hours. ABG  Recent Labs  02/14/17 0922 02/14/17 1143  PHART 7.224* 7.240*  HCO3 18.9* 19.3*    Studies/Results: Dg Chest Port 1 View  Result Date: 02/17/2017 CLINICAL DATA:  Shortness of breath, respiratory failure EXAM: PORTABLE CHEST 1 VIEW COMPARISON:  02/15/2017 FINDINGS: Interval extubation. Left subclavian pacer noted. NG tube extends to the stomach with the tip not visualized. Right IJ central line tip proximal SVC as before. No other change in support apparatus. Stable cardiomegaly with mild diffuse interstitial edema pattern superimposed on COPD/ emphysema. Basilar atelectasis evident with small effusions. Known left upper lobe mass is obscured by the overlying pacemaker. No pneumothorax. Atherosclerosis noted of the aorta. IMPRESSION: Stable cardiomegaly with diffuse edema pattern superimposed on CHF Basilar  atelectasis and small pleural effusions Left upper lobe spiculated mass is obscured by overlying pacer. Electronically Signed   By: Jerilynn Mages.  Shick M.D.   On: 02/17/2017 09:04    Anti-infectives: Anti-infectives    Start     Dose/Rate Route Frequency Ordered Stop   02/16/17 2330  vancomycin (VANCOCIN) 1,250 mg in sodium chloride 0.9 % 250 mL IVPB     1,250 mg 166.7 mL/hr over 90 Minutes Intravenous  Once 02/16/17 1100 02/17/17 0039   02/16/17 1000  vancomycin (VANCOCIN) IVPB 1000 mg/200 mL premix  Status:  Discontinued     1,000 mg 200 mL/hr over 60 Minutes Intravenous Every 48 hours 02/14/17 1159 02/15/17 1018   02/15/17 1700  anidulafungin (ERAXIS) 100 mg in sodium chloride 0.9 % 100 mL IVPB     100 mg over 90 Minutes Intravenous Every 24 hours 02/14/17 0943     02/14/17 1000  vancomycin (VANCOCIN) 1,750 mg in sodium chloride 0.9 % 500 mL IVPB     1,750 mg 250 mL/hr over 120 Minutes Intravenous  Once 02/14/17 0948 02/14/17 1229   02/14/17 0945  anidulafungin (ERAXIS) 200 mg in sodium chloride 0.9 % 200 mL IVPB     200 mg over 180 Minutes Intravenous  Once 02/14/17 0943 02/14/17 2025   02/13/17 1330  fluconazole (DIFLUCAN) IVPB 200 mg  Status:  Discontinued     200 mg 100 mL/hr over 60 Minutes Intravenous Every 24 hours 02/13/17 0900 02/14/17 0943   02/08/2017 1400  piperacillin-tazobactam (ZOSYN) IVPB 3.375 g     3.375 g 12.5  mL/hr over 240 Minutes Intravenous Every 8 hours 02/18/2017 1329     02/09/2017 1400  vancomycin (VANCOCIN) IVPB 750 mg/150 ml premix  Status:  Discontinued     750 mg 150 mL/hr over 60 Minutes Intravenous Every 12 hours 02/14/2017 1329 02/13/17 0900   02/05/2017 1330  fluconazole (DIFLUCAN) IVPB 400 mg  Status:  Discontinued     400 mg 100 mL/hr over 120 Minutes Intravenous Every 24 hours 02/05/2017 1254 02/13/17 0900   02/10/2017 1230  fluconazole (DIFLUCAN) IVPB 100 mg  Status:  Discontinued     100 mg 50 mL/hr over 60 Minutes Intravenous Every 24 hours 02/10/2017 1228  02/28/2017 1253   02/07/2017 0130  vancomycin (VANCOCIN) IVPB 1000 mg/200 mL premix     1,000 mg 200 mL/hr over 60 Minutes Intravenous  Once 03/04/2017 0118 02/19/2017 0235   02/09/2017 0115  piperacillin-tazobactam (ZOSYN) IVPB 3.375 g     3.375 g 100 mL/hr over 30 Minutes Intravenous  Once 02/18/2017 0108 02/25/2017 0134      Assessment/Plan: POD 5 graham patch perforation of marginal ulcer- Dr Brantley Stage  1. Appreciate ccm assistance with mgt 2. Continue protonix bid, continue ng tube, will get swallow likely next 48 hours to see if tube can come out and no leak from this patch, I think needs stay right now 3. Bid dressing changes 4. subq heparin 5. Now is do not intubate for any further issues  6. Discussed with his wife 7. Only needs 5-7 day abx course from my standpoint  [facility] 02/17/2017

## 2017-02-18 DIAGNOSIS — J189 Pneumonia, unspecified organism: Secondary | ICD-10-CM

## 2017-02-18 MED ORDER — SODIUM CHLORIDE 0.9 % IV SOLN
INTRAVENOUS | Status: DC
  Administered 2017-02-18: 250 mL via INTRAVENOUS
  Administered 2017-02-19: 01:00:00 via INTRAVENOUS

## 2017-02-18 NOTE — Clinical Social Work Note (Signed)
Pt was placed on HFNC. CSW following for updated PT eval.  Loletha Grayer, [place]

## 2017-02-18 NOTE — Progress Notes (Signed)
Gave report to [facility], and transferred the patient to 4E in bed on oxygen,  tolerated fairly well. Foley cath was not replaced with condom cath due to some swelling around the foreskin, reported nurse aware.   Gershon Crane

## 2017-02-18 NOTE — Progress Notes (Signed)
Patient pulled the NG tube out, informed Dr.Thompson. Ordered not to reinsert, just monitor bowel sound and distension. Patient can still have ice chips, but not too often.   Jesus Sanders

## 2017-02-18 NOTE — Progress Notes (Signed)
[facility]  S: no acute events Passing some gas Intermittent abdominal pain  O:  Vitals:   02/18/17 0700 02/18/17 0730 02/18/17 0831 02/18/17 0936  BP: 121/71 140/70    Pulse: 83 78    Resp: (!) 24 (!) 21    Temp:   97.6 F (36.4 C)   TempSrc:   Oral   SpO2: 100% 100%  100%  Weight:      Height:        Intake/Output Summary (Last 24 hours) at 02/18/17 1154 Last data filed at 02/18/17 0800  Gross per 24 hour  Intake            949.3 ml  Output             1900 ml  Net           -950.7 ml     Gen: obese, chronically ill appearing HEENT: NCAT, OP clear PULM: diminished breath sounds but OK air movement CV: Irreg irreg, no mgr GI minimal bowel sounds, dressing in place Derm: edema arms/legs Neuro: Awake, alert, conversant  CBC    Component Value Date/Time   WBC 7.2 02/17/2017 0300   RBC 4.99 02/17/2017 0300   HGB 13.6 02/17/2017 0300   HGB 15.6 02/27/2015 1155   HCT 42.8 02/17/2017 0300   HCT 49.1 02/27/2015 1155   PLT 78 (L) 02/17/2017 0300   PLT 136 (L) 02/27/2015 1155   MCV 85.8 02/17/2017 0300   MCV 91.6 02/27/2015 1155   MCH 27.3 02/17/2017 0300   MCHC 31.8 02/17/2017 0300   RDW 17.9 (H) 02/17/2017 0300   RDW 16.5 (H) 02/27/2015 1155   LYMPHSABS 1.1 02/10/2017 2030   LYMPHSABS 1.1 02/27/2015 1155   MONOABS 0.5 02/25/2017 2030   MONOABS 0.7 02/27/2015 1155   EOSABS 0.2 02/23/2017 2030   EOSABS 0.1 02/27/2015 1155   BASOSABS 0.0 02/19/2017 2030   BASOSABS 0.0 02/27/2015 1155   BMET    Component Value Date/Time   NA 145 02/17/2017 0300   NA 144 01/14/2015 1027   K 4.0 02/17/2017 0300   K 4.2 01/14/2015 1027   CL 110 02/17/2017 0300   CO2 23 02/17/2017 0300   CO2 28 01/14/2015 1027   GLUCOSE 75 02/17/2017 0300   GLUCOSE 131 01/14/2015 1027   BUN 51 (H) 02/17/2017 0300   BUN 19.2 01/14/2015 1027   CREATININE 4.28 (H) 02/17/2017 0300   CREATININE 1.3 01/14/2015 1027   CALCIUM 8.2 (L) 02/17/2017 0300   CALCIUM 10.0 01/14/2015 1027   GFRNONAA 12  (L) 02/17/2017 0300   GFRNONAA 43 (L) 10/24/2012 1327   GFRAA 14 (L) 02/17/2017 0300   GFRAA 49 (L) 10/24/2012 1327   Impression: AKI COPD Lung mass HCAP Chronic respiratory failure with hypoxemia Lung mass Duodenal ulcer Diastolic heart failure GERD Abdominal pain Afib/RVR  Discussion: He has done surprisingly well post extubation.  No evidence of bleeding, diuresing well but renal failure worsening.  He is not a candidate for dialysis based on his multiple comorbid illnesses.  Remains DNR.  If worsens then comfort measures.  In the meantime we will continue current treatment for his acute issues but won't escalate care.  Surgery appreciated  Plan: Transfer to SDU, [place] service Continue brovana/pulmicort Continue metoprolol Continue ceftriaxone through 4/15 Barium swallow/diet/wound care per surgery Continue PPI bid Continue amiodarone gtt until taking PO  Roselie Awkward, MD [place] [facility] Pager: [phone] Cell: [phone] After 3pm or if no response, call [phone]

## 2017-02-18 NOTE — Progress Notes (Signed)
6 Days Post-Op  Subjective: CC small BM yesterday but not passing much gas, bloated  Objective: Vital signs in last 24 hours: Temp:  [97.6 F (36.4 C)-98 F (36.7 C)] 97.6 F (36.4 C) (04/14 0831) Pulse Rate:  [73-141] 78 (04/14 0730) Resp:  [18-35] 21 (04/14 0730) BP: (95-167)/(65-145) 140/70 (04/14 0730) SpO2:  [83 %-100 %] 100 % (04/14 0936) Last BM Date: 02/17/17  Intake/Output from previous day: 04/13 0701 - 04/14 0700 In: 1142.1 [I.V.:902.1; IV Piggyback:50] Out: 2270 [Urine:1500; Emesis/NG output:500; Drains:270] Intake/Output this shift: Total I/O In: 33.3 [I.V.:33.3] Out: 75 [Urine:75]  General appearance: cooperative Resp: wheezes bilaterally GI: distended, a few BS, JP SS, wound clean  Lab Results:   Recent Labs  02/16/17 0311 02/17/17 0300  WBC 7.9 7.2  HGB 13.2 13.6  HCT 41.1 42.8  PLT 79* 78*   BMET  Recent Labs  02/16/17 0311 02/17/17 0300  NA 143 145  K 3.8 4.0  CL 111 110  CO2 24 23  GLUCOSE 107* 75  BUN 47* 51*  CREATININE 4.13* 4.28*  CALCIUM 8.2* 8.2*   PT/INR No results for input(s): LABPROT, INR in the last 72 hours. ABG No results for input(s): PHART, HCO3 in the last 72 hours.  Invalid input(s): PCO2, PO2  Studies/Results: Dg Chest Port 1 View  Result Date: 02/17/2017 CLINICAL DATA:  Shortness of breath, respiratory failure EXAM: PORTABLE CHEST 1 VIEW COMPARISON:  02/15/2017 FINDINGS: Interval extubation. Left subclavian pacer noted. NG tube extends to the stomach with the tip not visualized. Right IJ central line tip proximal SVC as before. No other change in support apparatus. Stable cardiomegaly with mild diffuse interstitial edema pattern superimposed on COPD/ emphysema. Basilar atelectasis evident with small effusions. Known left upper lobe mass is obscured by the overlying pacemaker. No pneumothorax. Atherosclerosis noted of the aorta. IMPRESSION: Stable cardiomegaly with diffuse edema pattern superimposed on CHF Basilar  atelectasis and small pleural effusions Left upper lobe spiculated mass is obscured by overlying pacer. Electronically Signed   By: Jerilynn Mages.  Shick M.D.   On: 02/17/2017 09:04    Anti-infectives: Anti-infectives    Start     Dose/Rate Route Frequency Ordered Stop   02/17/17 1300  cefTRIAXone (ROCEPHIN) 2 g in dextrose 5 % 50 mL IVPB     2 g 100 mL/hr over 30 Minutes Intravenous Every 24 hours 02/17/17 1029 02/20/17 1259   02/16/17 2330  vancomycin (VANCOCIN) 1,250 mg in sodium chloride 0.9 % 250 mL IVPB     1,250 mg 166.7 mL/hr over 90 Minutes Intravenous  Once 02/16/17 1100 02/17/17 0039   02/16/17 1000  vancomycin (VANCOCIN) IVPB 1000 mg/200 mL premix  Status:  Discontinued     1,000 mg 200 mL/hr over 60 Minutes Intravenous Every 48 hours 02/14/17 1159 02/15/17 1018   02/15/17 1700  anidulafungin (ERAXIS) 100 mg in sodium chloride 0.9 % 100 mL IVPB  Status:  Discontinued     100 mg over 90 Minutes Intravenous Every 24 hours 02/14/17 0943 02/17/17 1025   02/14/17 1000  vancomycin (VANCOCIN) 1,750 mg in sodium chloride 0.9 % 500 mL IVPB     1,750 mg 250 mL/hr over 120 Minutes Intravenous  Once 02/14/17 0948 02/14/17 1229   02/14/17 0945  anidulafungin (ERAXIS) 200 mg in sodium chloride 0.9 % 200 mL IVPB     200 mg over 180 Minutes Intravenous  Once 02/14/17 0943 02/14/17 2025   02/13/17 1330  fluconazole (DIFLUCAN) IVPB 200 mg  Status:  Discontinued  200 mg 100 mL/hr over 60 Minutes Intravenous Every 24 hours 02/13/17 0900 02/14/17 0943   02/09/2017 1400  piperacillin-tazobactam (ZOSYN) IVPB 3.375 g  Status:  Discontinued     3.375 g 12.5 mL/hr over 240 Minutes Intravenous Every 8 hours 02/28/2017 1329 02/17/17 1025   02/09/2017 1400  vancomycin (VANCOCIN) IVPB 750 mg/150 ml premix  Status:  Discontinued     750 mg 150 mL/hr over 60 Minutes Intravenous Every 12 hours 02/10/2017 1329 02/13/17 0900   02/25/2017 1330  fluconazole (DIFLUCAN) IVPB 400 mg  Status:  Discontinued     400 mg 100 mL/hr  over 120 Minutes Intravenous Every 24 hours 02/21/2017 1254 02/13/17 0900   02/27/2017 1230  fluconazole (DIFLUCAN) IVPB 100 mg  Status:  Discontinued     100 mg 50 mL/hr over 60 Minutes Intravenous Every 24 hours 02/17/2017 1228 02/19/2017 1253   03/05/2017 0130  vancomycin (VANCOCIN) IVPB 1000 mg/200 mL premix     1,000 mg 200 mL/hr over 60 Minutes Intravenous  Once 03/05/2017 0118 02/25/2017 0235   02/05/2017 0115  piperacillin-tazobactam (ZOSYN) IVPB 3.375 g     3.375 g 100 mL/hr over 30 Minutes Intravenous  Once 02/23/2017 0108 03/01/2017 0134      Assessment/Plan: s/p Procedure(s): REPAIR OF PERFORATED ULCER (N/A) POD 6 graham patch perforation of marginal ulcer- Dr Brantley Stage  1. Appreciate ccm assistance with mgt 2. Continue protonix bid, continue ng tube, will get swallow once bowel function improves - possibly tomorrow 3. Bid dressing changes 4. subq heparin 5. Now is do not intubate for any further issues  6. Discussed with his wife 7. AKI per primary  LOS: 6 days    Sanyiah Kanzler E 02/18/2017

## 2017-02-19 ENCOUNTER — Inpatient Hospital Stay (HOSPITAL_COMMUNITY): Payer: Medicare Other

## 2017-02-19 LAB — BASIC METABOLIC PANEL
ANION GAP: 14 (ref 5–15)
BUN: 81 mg/dL — AB (ref 6–20)
CO2: 21 mmol/L — AB (ref 22–32)
Calcium: 8.1 mg/dL — ABNORMAL LOW (ref 8.9–10.3)
Chloride: 112 mmol/L — ABNORMAL HIGH (ref 101–111)
Creatinine, Ser: 4.55 mg/dL — ABNORMAL HIGH (ref 0.61–1.24)
GFR calc Af Amer: 13 mL/min — ABNORMAL LOW (ref >60)
GFR calc non Af Amer: 11 mL/min — ABNORMAL LOW (ref >60)
GLUCOSE: 88 mg/dL (ref 65–99)
Potassium: 5.5 mmol/L — ABNORMAL HIGH (ref 3.5–5.1)
Sodium: 147 mmol/L — ABNORMAL HIGH (ref 135–145)

## 2017-02-19 LAB — CBC WITH DIFFERENTIAL/PLATELET
BAND NEUTROPHILS: 0 %
BASOS ABS: 0 10*3/uL (ref 0.0–0.1)
BASOS PCT: 0 %
Blasts: 0 %
EOS ABS: 0 10*3/uL (ref 0.0–0.7)
Eosinophils Relative: 0 %
HEMATOCRIT: 45.8 % (ref 39.0–52.0)
HEMOGLOBIN: 14.8 g/dL (ref 13.0–17.0)
LYMPHS PCT: 3 %
Lymphs Abs: 0.4 10*3/uL — ABNORMAL LOW (ref 0.7–4.0)
MCH: 27.8 pg (ref 26.0–34.0)
MCHC: 32.3 g/dL (ref 30.0–36.0)
MCV: 85.9 fL (ref 78.0–100.0)
METAMYELOCYTES PCT: 0 %
MONO ABS: 0.9 10*3/uL (ref 0.1–1.0)
MONOS PCT: 7 %
Myelocytes: 0 %
Neutro Abs: 11 10*3/uL — ABNORMAL HIGH (ref 1.7–7.7)
Neutrophils Relative %: 90 %
PROMYELOCYTES ABS: 0 %
Platelets: 138 10*3/uL — ABNORMAL LOW (ref 150–400)
RBC: 5.33 MIL/uL (ref 4.22–5.81)
RDW: 18.9 % — ABNORMAL HIGH (ref 11.5–15.5)
WBC: 12.3 10*3/uL — ABNORMAL HIGH (ref 4.0–10.5)
nRBC: 0 /100 WBC

## 2017-02-19 LAB — CULTURE, RESPIRATORY W GRAM STAIN

## 2017-02-19 LAB — CULTURE, RESPIRATORY

## 2017-02-19 MED ORDER — AMIODARONE HCL IN DEXTROSE 360-4.14 MG/200ML-% IV SOLN
15.0000 mg/h | INTRAVENOUS | Status: DC
  Filled 2017-02-19 (×2): qty 200

## 2017-02-19 NOTE — Progress Notes (Signed)
Note sent Dr regarding Amidodarone rate.

## 2017-02-19 NOTE — Progress Notes (Signed)
PROGRESS NOTE    Jesus Sanders  UEA:[id number] DOB: 1935/11/30 DOA: 02/06/2017 PCP: Leonard Downing, MD     Brief Narrative:  81 yo male who presented to the hospital with the chief complain of abdominal pain. Acute and severe pain, patient requested to be brought to the hospital. Pain localized at the LUQ. On the initial physical examination patient was found hypotensive 92/68, and tachycardic 101 bpm. Abdomen with significant tenderness. CT of the abdomen with pneumoperitoneum, suggestive perforation from ulcer at the jejunum at the site of prior gastrojejunostomy. Patient was intervened with laparotomy finding perforated marginal ulcer at pre-existing gastrojejunostomy. Patient developed post operative respiratory failure, remained on invasive mechanical ventilation. Extubated on 04/09 to be re-intubated on 04/10. Complicated course with hypotension requiring vasopressors. Positive right lower lobe pneumonia. Atrial fibrilaltion on amiodarone infusion. Worsenign renal failure with AKI. Extubated 04/12, transferred to step down unit on 04/14.   Assessment & Plan:   Principal Problem:   Bowel perforation (HCC) Active Problems:   CAD (coronary artery disease)   Chronic systolic heart failure (HCC)   Essential hypertension   Ischemic cardiomyopathy   Automatic implantable cardioverter-defibrillator- medtronic   CAP (community acquired pneumonia)   Lactic acidosis   Pressure injury of skin   Wide-complex tachycardia ([place])   1. Acute hypoxic respiratory failure. Patient note to be in respiratory distress, respiratory rate 30 to 39. will place on non invasive mechanical ventilation to improve work of breathing. Chest film personally reviewed noted interstitial infiltrates bilaterally with patchy infiltrate on the right base, with right small pleural effusion. Will continue aspiration precautions.    2. Right lower lobe pneumonia. Patient has been on broad spectrum antibiotic therapy,  de-escalated to ceftriaxone. Will continue to follow on temperature curve and cell count. Oxymetry monitoring. Placed on non invasive mechanical ventilation due to increase work of breathing.   3. Atrial fibrillation. Will continue amiodarone for rate control. No anticoagulation for now, recent surgery for perforated viscus. Will get echocardiography, last records from 10/17 with ejection fraction 45 to 50% with akinesis of the apex and mid antero septal myocardium. On scheduled metoprolol IV.   4. AKI. Continue worsening renal function with cr at 4,55 with K at 5,5 and serum bicarbonate at 21. Will continue to hold on IV fluids. Follow on renal panel in am, avoid hypotension or nephrotoxic medications.   5. Perforated ulcer at a prior gastrojejunostomy. Post laparotomy, drains in place, continue NPO, decreased bowel sounds, follow on surgery recommendations.    DVT prophylaxis: heparin sq Code Status: DNR Family Communication: No family at the bedside Disposition Plan: to be determined.   Consultants:   Surgery   Procedures:  Exploratory laparotomy with closure of marginal ulcer. 04/08   Antimicrobials:   Ceftriaxone 04/13    Subjective: Patient with respiratory distress, complains of dyspnea and abdominal pain, no chest pain. Patient transferred from the ICU to step down unit yesterday, has been NPO.   Objective: Vitals:   02/19/17 0208 02/19/17 0309 02/19/17 0758 02/19/17 0837  BP:  (!) 150/87 (!) 144/73   Pulse:  85 83   Resp:  (!) 30 (!) 27   Temp:  97.9 F (36.6 C) 97.5 F (36.4 C)   TempSrc:  Axillary Oral   SpO2: 96% 96% 91% 100%  Weight:      Height:        Intake/Output Summary (Last 24 hours) at 02/19/17 0847 Last data filed at 02/19/17 0315  Gross per 24 hour  Intake  903.3 ml  Output             1755 ml  Net           -851.7 ml   Filed Weights   03/03/2017 2001 02/15/17 0545  Weight: 99.8 kg (220 lb) (!) 170.6 kg (376 lb)     Examination:  General exam: deconditioned and ill looking appearing E ENT: positive pallor, no icterus, oral mucosa dry.   Respiratory system: Bilateral rhonchi with rales at the right base, no wheezing. Positive accessory muscle use. Cardiovascular system: S1 & S2 heard, RRR. No JVD, murmurs, rubs, gallops or clicks. No pedal edema. Gastrointestinal system: distended, 2 drains in place, surgical wound with dressings in place, decreased bowel movements, tender to palpation and tympanic to percussion.  Central nervous system: Alert and oriented. No focal neurological deficits. Extremities: Symmetric 5 x 5 power. Skin: cyanosis at the feet. Mottling at the knees bilaterally.      Data Reviewed: I have personally reviewed following labs and imaging studies  CBC:  Recent Labs Lab 02/13/17 0350 02/14/17 0315 02/15/17 0306 02/16/17 0311 02/17/17 0300  WBC 10.7* 13.2* 10.9* 7.9 7.2  HGB 15.0 15.3 13.0 13.2 13.6  HCT 47.8 48.4 41.2 41.1 42.8  MCV 88.5 88.3 86.9 86.0 85.8  PLT 79* 94* 83* 79* 78*   Basic Metabolic Panel:  Recent Labs Lab 02/13/17 1824 02/14/17 0315 02/15/17 0306 02/16/17 0311 02/17/17 0300  NA 143 140 142 143 145  K 4.7 4.2 3.4* 3.8 4.0  CL 112* 108 113* 111 110  CO2 20* '23 22 24 23  '$ GLUCOSE 123* 109* 121* 107* 75  BUN 40* 41* 45* 47* 51*  CREATININE 3.07* 3.33* 3.70* 4.13* 4.28*  CALCIUM 8.0* 8.2* 8.4* 8.2* 8.2*  MG  --  1.7 1.9  --   --   PHOS  --   --  2.8  --   --    GFR: Estimated Creatinine Clearance: 23.1 mL/min (A) (by C-G formula based on SCr of 4.28 mg/dL (H)). Liver Function Tests:  Recent Labs Lab 02/13/17 0350  AST 36  ALT 33  ALKPHOS 45  BILITOT 1.1  PROT 4.6*  ALBUMIN 2.3*   No results for input(s): LIPASE, AMYLASE in the last 168 hours. No results for input(s): AMMONIA in the last 168 hours. Coagulation Profile: No results for input(s): INR, PROTIME in the last 168 hours. Cardiac Enzymes: No results for input(s):  CKTOTAL, CKMB, CKMBINDEX, TROPONINI in the last 168 hours. BNP (last 3 results) No results for input(s): PROBNP in the last 8760 hours. HbA1C: No results for input(s): HGBA1C in the last 72 hours. CBG:  Recent Labs Lab 02/06/2017 1921  GLUCAP 105*   Lipid Profile: No results for input(s): CHOL, HDL, LDLCALC, TRIG, CHOLHDL, LDLDIRECT in the last 72 hours. Thyroid Function Tests: No results for input(s): TSH, T4TOTAL, FREET4, T3FREE, THYROIDAB in the last 72 hours. Anemia Panel: No results for input(s): VITAMINB12, FOLATE, FERRITIN, TIBC, IRON, RETICCTPCT in the last 72 hours. Sepsis Labs:  Recent Labs Lab 02/13/17 2135 02/14/17 1147 02/14/17 1411  LATICACIDVEN 1.1 0.8 0.9    Recent Results (from the past 240 hour(s))  Culture, blood (Routine X 2) w Reflex to ID Panel     Status: None   Collection Time: 02/20/2017 12:28 PM  Result Value Ref Range Status   Specimen Description BLOOD RIGHT HAND  Final   Special Requests IN PEDIATRIC BOTTLE Blood Culture adequate volume  Final   Culture NO GROWTH  5 DAYS  Final   Report Status 02/17/2017 FINAL  Final  Culture, blood (Routine X 2) w Reflex to ID Panel     Status: None   Collection Time: 02/20/2017  4:16 PM  Result Value Ref Range Status   Specimen Description BLOOD A-LINE  Final   Special Requests   Final    BOTTLES DRAWN AEROBIC AND ANAEROBIC Blood Culture results may not be optimal due to an excessive volume of blood received in culture bottles   Culture NO GROWTH 5 DAYS  Final   Report Status 02/17/2017 FINAL  Final  MRSA PCR Screening     Status: None   Collection Time: 02/22/2017  7:01 PM  Result Value Ref Range Status   MRSA by PCR NEGATIVE NEGATIVE Final    Comment:        The GeneXpert MRSA Assay (FDA approved for NASAL specimens only), is one component of a comprehensive MRSA colonization surveillance program. It is not intended to diagnose MRSA infection nor to guide or monitor treatment for MRSA infections.    Culture, respiratory (NON-Expectorated)     Status: None (Preliminary result)   Collection Time: 02/15/17  6:42 AM  Result Value Ref Range Status   Specimen Description TRACHEAL ASPIRATE  Final   Special Requests NONE  Final   Gram Stain   Final    MODERATE WBC PRESENT,BOTH PMN AND MONONUCLEAR NO ORGANISMS SEEN    Culture   Final    RARE YEAST CULTURE REINCUBATED FOR BETTER GROWTH    Report Status PENDING  Incomplete         Radiology Studies: No results found.      Scheduled Meds: . arformoterol  15 mcg Nebulization BID  . budesonide (PULMICORT) nebulizer solution  0.5 mg Nebulization BID  . cefTRIAXone (ROCEPHIN)  IV  2 g Intravenous Q24H  . Chlorhexidine Gluconate Cloth  6 each Topical Daily  . heparin subcutaneous  5,000 Units Subcutaneous Q8H  . ipratropium-albuterol  3 mL Nebulization Q6H  . mouth rinse  15 mL Mouth Rinse BID  . metoprolol  2.5 mg Intravenous Q6H  . pantoprazole (PROTONIX) IV  40 mg Intravenous Q12H  . sodium chloride flush  10-40 mL Intracatheter Q12H  . sodium chloride flush  3 mL Intravenous Q12H   Continuous Infusions: . sodium chloride 10 mL/hr at 02/19/17 0058  . amiodarone 30 mg/hr (02/19/17 0315)     LOS: 7 days        Lunette Tapp Gerome Apley, MD [facility] Pager [phone]  If 7PM-7AM, please contact night-coverage [email or web address] Password [facility] 02/19/2017, 8:47 AM

## 2017-02-19 NOTE — Progress Notes (Signed)
[facility] Progress Note  7 Days Post-Op  Subjective: CC: bloating and SOB Pt with increased work of breathing is AM, placing on BiPap. Pt reports minimal flatus and no BM yesterday/today.   Objective: Vital signs in last 24 hours: Temp:  [97.5 F (36.4 C)-98.1 F (36.7 C)] 97.5 F (36.4 C) (04/15 0758) Pulse Rate:  [82-93] 83 (04/15 0758) Resp:  [23-30] 27 (04/15 0758) BP: (125-155)/(68-91) 144/73 (04/15 0758) SpO2:  [91 %-100 %] 100 % (04/15 0837) Last BM Date: 02/17/17  Intake/Output from previous day: 04/14 0701 - 04/15 0700 In: 946.6 [P.O.:180; I.V.:716.6; IV Piggyback:50] Out: 1027 [OZDGU:4403; Emesis/NG output:250; Drains:205] Intake/Output this shift: No intake/output data recorded.  PE: Gen:  Alert, NAD, pleasant and cooperative Card:  Regular rate and rhythm Pulm:  Increased work of breathing, bilateral rhonchi, sats 97% on BiPap Abd: Soft, non-tender, distended, bowel sounds present, incisions C/D/I  NGT: removed  JP: 205 cc/24h SS Ext:  Mittens in hands  Psych: A&Ox3   Lab Results:   Recent Labs  02/17/17 0300  WBC 7.2  HGB 13.6  HCT 42.8  PLT 78*   BMET  Recent Labs  02/17/17 0300  NA 145  K 4.0  CL 110  CO2 23  GLUCOSE 75  BUN 51*  CREATININE 4.28*  CALCIUM 8.2*   PT/INR No results for input(s): LABPROT, INR in the last 72 hours. CMP     Component Value Date/Time   NA 145 02/17/2017 0300   NA 144 01/14/2015 1027   K 4.0 02/17/2017 0300   K 4.2 01/14/2015 1027   CL 110 02/17/2017 0300   CO2 23 02/17/2017 0300   CO2 28 01/14/2015 1027   GLUCOSE 75 02/17/2017 0300   GLUCOSE 131 01/14/2015 1027   BUN 51 (H) 02/17/2017 0300   BUN 19.2 01/14/2015 1027   CREATININE 4.28 (H) 02/17/2017 0300   CREATININE 1.3 01/14/2015 1027   CALCIUM 8.2 (L) 02/17/2017 0300   CALCIUM 10.0 01/14/2015 1027   PROT 4.6 (L) 02/13/2017 0350   PROT 7.1 01/14/2015 1027   ALBUMIN 2.3 (L) 02/13/2017 0350   ALBUMIN 3.9 01/14/2015 1027   AST  36 02/13/2017 0350   AST 17 01/14/2015 1027   ALT 33 02/13/2017 0350   ALT 12 01/14/2015 1027   ALKPHOS 45 02/13/2017 0350   ALKPHOS 96 01/14/2015 1027   BILITOT 1.1 02/13/2017 0350   BILITOT 0.97 01/14/2015 1027   GFRNONAA 12 (L) 02/17/2017 0300   GFRNONAA 43 (L) 10/24/2012 1327   GFRAA 14 (L) 02/17/2017 0300   GFRAA 49 (L) 10/24/2012 1327   Lipase     Component Value Date/Time   LIPASE 16 02/22/2017 2030       Studies/Results: No results found.  Anti-infectives: Anti-infectives    Start     Dose/Rate Route Frequency Ordered Stop   02/17/17 1300  cefTRIAXone (ROCEPHIN) 2 g in dextrose 5 % 50 mL IVPB     2 g 100 mL/hr over 30 Minutes Intravenous Every 24 hours 02/17/17 1029 02/20/17 1259   02/16/17 2330  vancomycin (VANCOCIN) 1,250 mg in sodium chloride 0.9 % 250 mL IVPB     1,250 mg 166.7 mL/hr over 90 Minutes Intravenous  Once 02/16/17 1100 02/17/17 0039   02/16/17 1000  vancomycin (VANCOCIN) IVPB 1000 mg/200 mL premix  Status:  Discontinued     1,000 mg 200 mL/hr over 60 Minutes Intravenous Every 48 hours 02/14/17 1159 02/15/17 1018   02/15/17 1700  anidulafungin (ERAXIS) 100 mg  in sodium chloride 0.9 % 100 mL IVPB  Status:  Discontinued     100 mg over 90 Minutes Intravenous Every 24 hours 02/14/17 0943 02/17/17 1025   02/14/17 1000  vancomycin (VANCOCIN) 1,750 mg in sodium chloride 0.9 % 500 mL IVPB     1,750 mg 250 mL/hr over 120 Minutes Intravenous  Once 02/14/17 0948 02/14/17 1229   02/14/17 0945  anidulafungin (ERAXIS) 200 mg in sodium chloride 0.9 % 200 mL IVPB     200 mg over 180 Minutes Intravenous  Once 02/14/17 0943 02/14/17 2025   02/13/17 1330  fluconazole (DIFLUCAN) IVPB 200 mg  Status:  Discontinued     200 mg 100 mL/hr over 60 Minutes Intravenous Every 24 hours 02/13/17 0900 02/14/17 0943   02/08/2017 1400  piperacillin-tazobactam (ZOSYN) IVPB 3.375 g  Status:  Discontinued     3.375 g 12.5 mL/hr over 240 Minutes Intravenous Every 8 hours 02/14/2017  1329 02/17/17 1025   02/16/2017 1400  vancomycin (VANCOCIN) IVPB 750 mg/150 ml premix  Status:  Discontinued     750 mg 150 mL/hr over 60 Minutes Intravenous Every 12 hours 02/06/2017 1329 02/13/17 0900   02/26/2017 1330  fluconazole (DIFLUCAN) IVPB 400 mg  Status:  Discontinued     400 mg 100 mL/hr over 120 Minutes Intravenous Every 24 hours 02/28/2017 1254 02/13/17 0900   02/28/2017 1230  fluconazole (DIFLUCAN) IVPB 100 mg  Status:  Discontinued     100 mg 50 mL/hr over 60 Minutes Intravenous Every 24 hours 02/07/2017 1228 03/04/2017 1253   02/08/2017 0130  vancomycin (VANCOCIN) IVPB 1000 mg/200 mL premix     1,000 mg 200 mL/hr over 60 Minutes Intravenous  Once 02/07/2017 0118 02/27/2017 0235   02/19/2017 0115  piperacillin-tazobactam (ZOSYN) IVPB 3.375 g     3.375 g 100 mL/hr over 30 Minutes Intravenous  Once 02/13/2017 0108 02/10/2017 0134     Assessment/Plan s/p Procedure(s): REPAIR OF PERFORATED ULCER (N/A) POD 7 graham patch perforation of marginal ulcer- 02/17/2017, Dr Brantley Stage - continue BID PPI  - continue to await further signs of bowel function - minimal flatus, one small BM 48h ago, distended abdomen - continue NGT, NPO, IVF; UGI early next week to r/o leak - continue BID dressing changes  - IS/pulm toilet   AKI: per primary VTE: SubQ heparin ID:  Code status: DNI   Dispo: SDU, oxygenation improved on BiPap. CXR pending  Appreciate medicine/pulmonology continued assistance.    LOS: 7 days    [facility] 02/19/2017, 8:55 AM Pager: [phone] Consults: [phone] Mon-Fri 7:00 am-4:30 pm Sat-Sun 7:00 am-11:30 am

## 2017-02-20 ENCOUNTER — Inpatient Hospital Stay (HOSPITAL_COMMUNITY): Payer: Medicare Other

## 2017-02-20 DIAGNOSIS — R0600 Dyspnea, unspecified: Secondary | ICD-10-CM

## 2017-02-20 LAB — ECHOCARDIOGRAM LIMITED
FS: 19 % — AB (ref 28–44)
Height: 72 in
IV/PV OW: 1
LA ID, A-P, ES: 34 mm
LA diam end sys: 34 mm
LA diam index: 1.22 cm/m2
LA vol A4C: 30.5 ml
LVOT area: 5.31 cm2
LVOT diameter: 26 mm
PW: 13 mm — AB (ref 0.6–1.1)
Weight: 6016 oz

## 2017-02-20 LAB — BASIC METABOLIC PANEL
Anion gap: 15 (ref 5–15)
BUN: 90 mg/dL — AB (ref 6–20)
CALCIUM: 8.4 mg/dL — AB (ref 8.9–10.3)
CHLORIDE: 119 mmol/L — AB (ref 101–111)
CO2: 19 mmol/L — AB (ref 22–32)
CREATININE: 6.16 mg/dL — AB (ref 0.61–1.24)
GFR calc Af Amer: 9 mL/min — ABNORMAL LOW (ref >60)
GFR calc non Af Amer: 8 mL/min — ABNORMAL LOW (ref >60)
Glucose, Bld: 92 mg/dL (ref 65–99)
Potassium: 4.4 mmol/L (ref 3.5–5.1)
SODIUM: 153 mmol/L — AB (ref 135–145)

## 2017-02-20 LAB — CBC WITH DIFFERENTIAL/PLATELET
BASOS ABS: 0 10*3/uL (ref 0.0–0.1)
BASOS PCT: 0 %
EOS ABS: 0.1 10*3/uL (ref 0.0–0.7)
Eosinophils Relative: 1 %
HCT: 44.8 % (ref 39.0–52.0)
HEMOGLOBIN: 14.5 g/dL (ref 13.0–17.0)
LYMPHS PCT: 5 %
Lymphs Abs: 0.7 10*3/uL (ref 0.7–4.0)
MCH: 27.4 pg (ref 26.0–34.0)
MCHC: 32.4 g/dL (ref 30.0–36.0)
MCV: 84.5 fL (ref 78.0–100.0)
Monocytes Absolute: 0.4 10*3/uL (ref 0.1–1.0)
Monocytes Relative: 3 %
NEUTROS ABS: 12.1 10*3/uL — AB (ref 1.7–7.7)
Neutrophils Relative %: 91 %
Platelets: DECREASED 10*3/uL (ref 150–400)
RBC: 5.3 MIL/uL (ref 4.22–5.81)
RDW: 18.5 % — AB (ref 11.5–15.5)
Smear Review: DECREASED
WBC: 13.3 10*3/uL — ABNORMAL HIGH (ref 4.0–10.5)

## 2017-02-20 MED ORDER — SODIUM CHLORIDE 0.45 % IV SOLN
INTRAVENOUS | Status: AC
  Administered 2017-02-20: 100 mL/h via INTRAVENOUS
  Administered 2017-02-21 – 2017-02-23 (×3): via INTRAVENOUS

## 2017-02-20 MED ORDER — PERFLUTREN LIPID MICROSPHERE
1.0000 mL | INTRAVENOUS | Status: AC | PRN
  Administered 2017-02-20: 2 mL via INTRAVENOUS
  Filled 2017-02-20 (×2): qty 10

## 2017-02-20 NOTE — Progress Notes (Addendum)
PROGRESS NOTE    Jesus Sanders  KKX:[id number] DOB: Jul 26, 1936 DOA: 02/28/2017 PCP: Leonard Downing, MD    Brief Narrative:  81 yo male who presented to the hospital with the chief complain of abdominal pain. Acute and severe pain, patient requested to be brought to the hospital. Pain localized at the LUQ. On the initial physical examination patient was found hypotensive 92/68, and tachycardic 101 bpm. Abdomen with significant tenderness. CT of the abdomen with pneumoperitoneum, suggestive perforation from ulcer at the jejunum at the site of prior gastrojejunostomy. Patient was intervened with laparotomy finding perforated marginal ulcer at pre-existing gastrojejunostomy. Patient developed post operative respiratory failure, remained on invasive mechanical ventilation. Extubated on 04/09 to be re-intubated on 04/10. Complicated course with hypotension requiring vasopressors. Positive right lower lobe pneumonia. Atrial fibrilaltion on amiodarone infusion. Worsenign renal failure with AKI. Extubated 04/12, transferred to step down unit on 04/14.  Placed on bipap due to increased work of breathing. On fentanyl as needed.   Assessment & Plan:   Principal Problem:   Bowel perforation (HCC) Active Problems:   CAD (coronary artery disease)   Chronic systolic heart failure (HCC)   Essential hypertension   Ischemic cardiomyopathy   Automatic implantable cardioverter-defibrillator- medtronic   CAP (community acquired pneumonia)   Lactic acidosis   Pressure injury of skin   Wide-complex tachycardia ([place])    1. Acute hypoxic respiratory failure. Patient tolerated well non invasive mechanical ventilation with improvement of work of breathing, will trial high flow nasal cannula today with plan to resume bipap at night. Will continue oxymetry monitoring and antibiotic therapy. Bronchodilator therapy as needed.  2. Right lower lobe pneumonia. Continue ceftriaxone IV, patient has been on broad  spectrum antibiotic therapy, during this hospitalization. Wbc at 25, will continue to follow cell count in am.   3. Atrial fibrillation. Continue amiodarone for rate control, IV infusion at 15 ml per hour, with good toleration. Holding anticoagulation for now, recent surgery for perforated viscus. Continue scheduled metoprolol. Follow on limited echocardiography.    4. AKI. Holding IV fluids, will follow on renal panel. Blood sample this am hemolyzed, will resend sample. Avoid hypotension or nephrotoxic medications.   5. Perforated ulcer at a prior gastrojejunostomy. Patient pulled NG tube, no further nausea or vomiting. Will attmept replacement if tolerated.  Continue NPO, decreased bowel sounds, follow on surgery recommendations. May need tpn if continue to be NPO.    DVT prophylaxis: heparin sq Code Status: DNR Family Communication: I spoke with patient's family at the bedside and all questions were addressed, will continue current medical therapy with no escalation. Main focus to keep patient with no suffering.  Disposition Plan: to be determined.   Consultants:   Surgery   Procedures:  Exploratory laparotomy with closure of marginal ulcer. 04/08   Antimicrobials:   Ceftriaxone 04/13      Subjective: Patient placed on bipap with improved dyspnea, no chest pain, nausea or vomiting. NG tube has been removed. Tolerating well fentanyl for pain and distress.   Objective: Vitals:   02/20/17 0013 02/20/17 0250 02/20/17 0415 02/20/17 0416  BP: (!) 145/85   130/76  Pulse: 87   77  Resp: (!) 28   (!) 27  Temp: 98 F (36.7 C)  98 F (36.7 C) 98 F (36.7 C)  TempSrc: Axillary   Axillary  SpO2:  95%  95%  Weight:      Height:        Intake/Output Summary (Last 24 hours) at 02/20/17  [place] filed at 02/20/17 0600  Gross per 24 hour  Intake           800.11 ml  Output              645 ml  Net           155.11 ml   Filed Weights   02/25/2017 2001 02/15/17 0545   Weight: 99.8 kg (220 lb) (!) 170.6 kg (376 lb)    Examination:  General exam: deconditioned and ill looking appering E ENT: positive pallor, oral mucosa dry, no icterus. Patient on a full face mask bipap.  Respiratory system: Positive rales at bases and scattered rhonchi, no wheezing.  Cardiovascular system: S1 & S2 heard, RRR. No JVD, murmurs, rubs, gallops or clicks. No pedal edema. Gastrointestinal system: Abdomen is distended and tympanic, mild tenderness to deep palpation. No organomegaly or masses felt. Decreased bowel sounds.  Central nervous system: Alert and oriented. No focal neurological deficits. Extremities: Symmetric 5 x 5 power. Skin: No rashes, lesions or ulcers, on anterior examination. Positive cyanosis on the feet with decreased local temperature, pulses palpable.      Data Reviewed: I have personally reviewed following labs and imaging studies  CBC:  Recent Labs Lab 02/15/17 0306 02/16/17 0311 02/17/17 0300 02/19/17 1348 02/20/17 0551  WBC 10.9* 7.9 7.2 12.3* 13.3*  NEUTROABS  --   --   --  11.0* 12.1*  HGB 13.0 13.2 13.6 14.8 14.5  HCT 41.2 41.1 42.8 45.8 44.8  MCV 86.9 86.0 85.8 85.9 84.5  PLT 83* 79* 78* 138* PLATELET CLUMPS NOTED ON SMEAR, COUNT APPEARS DECREASED   Basic Metabolic Panel:  Recent Labs Lab 02/14/17 0315 02/15/17 0306 02/16/17 0311 02/17/17 0300 02/19/17 1348  NA 140 142 143 145 147*  K 4.2 3.4* 3.8 4.0 5.5*  CL 108 113* 111 110 112*  CO2 '23 22 24 23 '$ 21*  GLUCOSE 109* 121* 107* 75 88  BUN 41* 45* 47* 51* 81*  CREATININE 3.33* 3.70* 4.13* 4.28* 4.55*  CALCIUM 8.2* 8.4* 8.2* 8.2* 8.1*  MG 1.7 1.9  --   --   --   PHOS  --  2.8  --   --   --    GFR: Estimated Creatinine Clearance: 21.7 mL/min (A) (by C-G formula based on SCr of 4.55 mg/dL (H)). Liver Function Tests: No results for input(s): AST, ALT, ALKPHOS, BILITOT, PROT, ALBUMIN in the last 168 hours. No results for input(s): LIPASE, AMYLASE in the last 168 hours. No  results for input(s): AMMONIA in the last 168 hours. Coagulation Profile: No results for input(s): INR, PROTIME in the last 168 hours. Cardiac Enzymes: No results for input(s): CKTOTAL, CKMB, CKMBINDEX, TROPONINI in the last 168 hours. BNP (last 3 results) No results for input(s): PROBNP in the last 8760 hours. HbA1C: No results for input(s): HGBA1C in the last 72 hours. CBG: No results for input(s): GLUCAP in the last 168 hours. Lipid Profile: No results for input(s): CHOL, HDL, LDLCALC, TRIG, CHOLHDL, LDLDIRECT in the last 72 hours. Thyroid Function Tests: No results for input(s): TSH, T4TOTAL, FREET4, T3FREE, THYROIDAB in the last 72 hours. Anemia Panel: No results for input(s): VITAMINB12, FOLATE, FERRITIN, TIBC, IRON, RETICCTPCT in the last 72 hours. Sepsis Labs:  Recent Labs Lab 02/13/17 2135 02/14/17 1147 02/14/17 1411  LATICACIDVEN 1.1 0.8 0.9    Recent Results (from the past 240 hour(s))  Culture, blood (Routine X 2) w Reflex to ID Panel  Status: None   Collection Time: 02/16/2017 12:28 PM  Result Value Ref Range Status   Specimen Description BLOOD RIGHT HAND  Final   Special Requests IN PEDIATRIC BOTTLE Blood Culture adequate volume  Final   Culture NO GROWTH 5 DAYS  Final   Report Status 02/17/2017 FINAL  Final  Culture, blood (Routine X 2) w Reflex to ID Panel     Status: None   Collection Time: 03/04/2017  4:16 PM  Result Value Ref Range Status   Specimen Description BLOOD A-LINE  Final   Special Requests   Final    BOTTLES DRAWN AEROBIC AND ANAEROBIC Blood Culture results may not be optimal due to an excessive volume of blood received in culture bottles   Culture NO GROWTH 5 DAYS  Final   Report Status 02/17/2017 FINAL  Final  MRSA PCR Screening     Status: None   Collection Time: 02/21/2017  7:01 PM  Result Value Ref Range Status   MRSA by PCR NEGATIVE NEGATIVE Final    Comment:        The GeneXpert MRSA Assay (FDA approved for NASAL specimens only), is  one component of a comprehensive MRSA colonization surveillance program. It is not intended to diagnose MRSA infection nor to guide or monitor treatment for MRSA infections.   Culture, respiratory (NON-Expectorated)     Status: None   Collection Time: 02/15/17  6:42 AM  Result Value Ref Range Status   Specimen Description TRACHEAL ASPIRATE  Final   Special Requests NONE  Final   Gram Stain   Final    MODERATE WBC PRESENT,BOTH PMN AND MONONUCLEAR NO ORGANISMS SEEN    Culture RARE CANDIDA ALBICANS  Final   Report Status 02/19/2017 FINAL  Final         Radiology Studies: Dg Chest Port 1 View  Result Date: 02/19/2017 CLINICAL DATA:  Dyspnea. EXAM: PORTABLE CHEST 1 VIEW COMPARISON:  Two-view chest x-ray 02/17/2017 FINDINGS: A right IJ catheter is stable.  The NG tube has been removed. Heart is enlarged. Progressive bilateral lower lobe airspace disease is present, right greater than left. Increasing left upper lobe airspace disease present as well. A diffuse interstitial pattern is noted. Bilateral pleural effusions are suspected. Left upper lobe mass is partially obscured by the pacemaker. IMPRESSION: 1. Cardiomegaly with increasing interstitial edema and bilateral effusions likely reflecting congestive heart failure. 2. Superimposed airspace consolidation in the lower lobes bilaterally, right greater than left, and a left upper lobe. While this may represent progressive edema, lobar pneumonia is also considered. 3. Left upper lobe mass is partially obscured by the pacemaker. Electronically Signed   By: San Morelle M.D.   On: 02/19/2017 12:04        Scheduled Meds: . arformoterol  15 mcg Nebulization BID  . budesonide (PULMICORT) nebulizer solution  0.5 mg Nebulization BID  . heparin subcutaneous  5,000 Units Subcutaneous Q8H  . ipratropium-albuterol  3 mL Nebulization Q6H  . mouth rinse  15 mL Mouth Rinse BID  . metoprolol  2.5 mg Intravenous Q6H  . pantoprazole  (PROTONIX) IV  40 mg Intravenous Q12H  . sodium chloride flush  10-40 mL Intracatheter Q12H  . sodium chloride flush  3 mL Intravenous Q12H   Continuous Infusions: . sodium chloride 10 mL/hr at 02/19/17 0058  . amiodarone 30 mg/hr (02/19/17 1900)     LOS: 8 days        Mauricio Gerome Apley, MD [facility] Pager [phone]  If 7PM-7AM, please  contact night-coverage [email or web address] Password [email or web address] 02/20/2017, 8:21 AM

## 2017-02-20 NOTE — Progress Notes (Signed)
  Echocardiogram 2D Echocardiogram has been performed.  Jesus Sanders 02/20/2017, 3:45 PM

## 2017-02-20 NOTE — Care Management Note (Signed)
Case Management Note  Patient Details  Name: LIBAN GUEDES MRN: [id number] Date of Birth: January 06, 1936  Subjective/Objective: Pt presented for abdominal pain- bowel perforation. Pt with hx of Chronic Respiratory Failure 2/2 COPD and CHF. Post op-repair of perforated ulcer. Post op Ileus- NPO per RN. Pt continues on IV Amio gtt. PT/OT recommendations for SNF.                 Action/Plan: CM did speak with CSW Lorrine Kin in regards to disposition recommendations. CM will continue to monitor.   Expected Discharge Date:               Expected Discharge Plan:  Skilled Nursing Facility  In-House Referral:  Clinical Social Work  Discharge planning Services  CM Consult  Post Acute Care Choice:  NA Choice offered to:  NA  DME Arranged:  N/A DME Agency:  NA  HH Arranged:  NA HH Agency:  NA  Status of Service:     If discussed at [place] of Stay Meetings, dates discussed:    Additional Comments:  Bethena Roys, RN 02/20/2017, 2:34 PM

## 2017-02-20 NOTE — Progress Notes (Signed)
[facility] Progress Note  8 Days Post-Op  Subjective: CC: SOB  Pt denies abdominal pain but reports bloating/distention. Flatus is decreased and denies BM. Denies nausea/vomiting.  Respiratory at bedside.  Objective: Vital signs in last 24 hours: Temp:  [97.3 F (36.3 C)-98 F (36.7 C)] 98 F (36.7 C) (04/16 0416) Pulse Rate:  [77-88] 77 (04/16 0416) Resp:  [27-39] 27 (04/16 0416) BP: (130-148)/(76-133) 130/76 (04/16 0416) SpO2:  [95 %-99 %] 95 % (04/16 0416) FiO2 (%):  [35 %] 35 % (04/16 0416) Last BM Date: 02/17/17  Intake/Output from previous day: 04/15 0701 - 04/16 0700 In: 800.1 [I.V.:800.1] Out: 645 [Urine:575; Drains:70] Intake/Output this shift: No intake/output data recorded.  PE: Gen:  Alert, NAD, pleasant and cooperative Card:  Regular rate and rhythm Pulm:  Increased work of breathing, bilateral rhonchi improved compared to yesterday, sats 90% on venti-mask - pt will have to be placed back on BiPap Abd: Soft, non-tender, moderate distention, tympanic, minimal bowel sounds, no peritonitis              NGT: removed             JP: 70 cc/24h SS  Ext:  Mittens in hands  Psych: A&Ox3   Lab Results:   Recent Labs  02/19/17 1348 02/20/17 0551  WBC 12.3* 13.3*  HGB 14.8 14.5  HCT 45.8 44.8  PLT 138* PLATELET CLUMPS NOTED ON SMEAR, COUNT APPEARS DECREASED   BMET  Recent Labs  02/19/17 1348  NA 147*  K 5.5*  CL 112*  CO2 21*  GLUCOSE 88  BUN 81*  CREATININE 4.55*  CALCIUM 8.1*   PT/INR No results for input(s): LABPROT, INR in the last 72 hours. CMP     Component Value Date/Time   NA 147 (H) 02/19/2017 1348   NA 144 01/14/2015 1027   K 5.5 (H) 02/19/2017 1348   K 4.2 01/14/2015 1027   CL 112 (H) 02/19/2017 1348   CO2 21 (L) 02/19/2017 1348   CO2 28 01/14/2015 1027   GLUCOSE 88 02/19/2017 1348   GLUCOSE 131 01/14/2015 1027   BUN 81 (H) 02/19/2017 1348   BUN 19.2 01/14/2015 1027   CREATININE 4.55 (H) 02/19/2017 1348   CREATININE 1.3 01/14/2015 1027   CALCIUM 8.1 (L) 02/19/2017 1348   CALCIUM 10.0 01/14/2015 1027   PROT 4.6 (L) 02/13/2017 0350   PROT 7.1 01/14/2015 1027   ALBUMIN 2.3 (L) 02/13/2017 0350   ALBUMIN 3.9 01/14/2015 1027   AST 36 02/13/2017 0350   AST 17 01/14/2015 1027   ALT 33 02/13/2017 0350   ALT 12 01/14/2015 1027   ALKPHOS 45 02/13/2017 0350   ALKPHOS 96 01/14/2015 1027   BILITOT 1.1 02/13/2017 0350   BILITOT 0.97 01/14/2015 1027   GFRNONAA 11 (L) 02/19/2017 1348   GFRNONAA 43 (L) 10/24/2012 1327   GFRAA 13 (L) 02/19/2017 1348   GFRAA 49 (L) 10/24/2012 1327   Lipase     Component Value Date/Time   LIPASE 16 02/22/2017 2030       Studies/Results: Dg Chest Port 1 View  Result Date: 02/19/2017 CLINICAL DATA:  Dyspnea. EXAM: PORTABLE CHEST 1 VIEW COMPARISON:  Two-view chest x-ray 02/17/2017 FINDINGS: A right IJ catheter is stable.  The NG tube has been removed. Heart is enlarged. Progressive bilateral lower lobe airspace disease is present, right greater than left. Increasing left upper lobe airspace disease present as well. A diffuse interstitial pattern is noted. Bilateral pleural effusions are suspected. Left upper  lobe mass is partially obscured by the pacemaker. IMPRESSION: 1. Cardiomegaly with increasing interstitial edema and bilateral effusions likely reflecting congestive heart failure. 2. Superimposed airspace consolidation in the lower lobes bilaterally, right greater than left, and a left upper lobe. While this may represent progressive edema, lobar pneumonia is also considered. 3. Left upper lobe mass is partially obscured by the pacemaker. Electronically Signed   By: San Morelle M.D.   On: 02/19/2017 12:04    Anti-infectives: Anti-infectives    Start     Dose/Rate Route Frequency Ordered Stop   02/17/17 1300  cefTRIAXone (ROCEPHIN) 2 g in dextrose 5 % 50 mL IVPB     2 g 100 mL/hr over 30 Minutes Intravenous Every 24 hours 02/17/17 1029 02/19/17 1348    02/16/17 2330  vancomycin (VANCOCIN) 1,250 mg in sodium chloride 0.9 % 250 mL IVPB     1,250 mg 166.7 mL/hr over 90 Minutes Intravenous  Once 02/16/17 1100 02/17/17 0039   02/16/17 1000  vancomycin (VANCOCIN) IVPB 1000 mg/200 mL premix  Status:  Discontinued     1,000 mg 200 mL/hr over 60 Minutes Intravenous Every 48 hours 02/14/17 1159 02/15/17 1018   02/15/17 1700  anidulafungin (ERAXIS) 100 mg in sodium chloride 0.9 % 100 mL IVPB  Status:  Discontinued     100 mg over 90 Minutes Intravenous Every 24 hours 02/14/17 0943 02/17/17 1025   02/14/17 1000  vancomycin (VANCOCIN) 1,750 mg in sodium chloride 0.9 % 500 mL IVPB     1,750 mg 250 mL/hr over 120 Minutes Intravenous  Once 02/14/17 0948 02/14/17 1229   02/14/17 0945  anidulafungin (ERAXIS) 200 mg in sodium chloride 0.9 % 200 mL IVPB     200 mg over 180 Minutes Intravenous  Once 02/14/17 0943 02/14/17 2025   02/13/17 1330  fluconazole (DIFLUCAN) IVPB 200 mg  Status:  Discontinued     200 mg 100 mL/hr over 60 Minutes Intravenous Every 24 hours 02/13/17 0900 02/14/17 0943   02/10/2017 1400  piperacillin-tazobactam (ZOSYN) IVPB 3.375 g  Status:  Discontinued     3.375 g 12.5 mL/hr over 240 Minutes Intravenous Every 8 hours 02/20/2017 1329 02/17/17 1025   02/06/2017 1400  vancomycin (VANCOCIN) IVPB 750 mg/150 ml premix  Status:  Discontinued     750 mg 150 mL/hr over 60 Minutes Intravenous Every 12 hours 03/03/2017 1329 02/13/17 0900   02/25/2017 1330  fluconazole (DIFLUCAN) IVPB 400 mg  Status:  Discontinued     400 mg 100 mL/hr over 120 Minutes Intravenous Every 24 hours 02/23/2017 1254 02/13/17 0900   03/04/2017 1230  fluconazole (DIFLUCAN) IVPB 100 mg  Status:  Discontinued     100 mg 50 mL/hr over 60 Minutes Intravenous Every 24 hours 02/10/2017 1228 02/10/2017 1253   02/17/2017 0130  vancomycin (VANCOCIN) IVPB 1000 mg/200 mL premix     1,000 mg 200 mL/hr over 60 Minutes Intravenous  Once 02/05/2017 0118 03/03/2017 0235   03/04/2017 0115   piperacillin-tazobactam (ZOSYN) IVPB 3.375 g     3.375 g 100 mL/hr over 30 Minutes Intravenous  Once 02/26/2017 0108 02/23/2017 0134     Assessment/Plan S/p Procedure(s): REPAIR OF PERFORATED ULCER (N/A) POD 7graham patch perforation of marginal ulcer- 02/16/2017, Dr Brantley Stage - continue BID PPI  - continue to await further signs of bowel function - minimal flatus, one small BM 48h ago, distended abdomen - post-op ileus: place NGT, NPO, IVF; possible UGI this week to r/o leak - continue BID dressing changes  -  IS/pulm toilet   AKI: per primary VTE: SubQ heparin ID:  Code status: DNI   Plan: worsening abdominal distention with minimal signs of bowel function and increased work of breathing, place nasogastric tube.   Appreciate medicine/pulmonology continued assistance.    LOS: 8 days    Jill Alexanders , [facility] Surgery 02/20/2017, 8:49 AM Pager: [phone] Consults: [phone] Mon-Fri 7:00 am-4:30 pm Sat-Sun 7:00 am-11:30 am

## 2017-02-20 NOTE — Progress Notes (Signed)
Call to Dr Cathlean Sauer about runs of V tach.  Orders rec'd.

## 2017-02-20 NOTE — Care Management Important Message (Signed)
Important Message  Patient Details  Name: Jesus Sanders MRN: [id number] Date of Birth: 02-Nov-1936   Medicare Important Message Given:  Yes    Nathen May 02/20/2017, 2:56 PM

## 2017-02-21 ENCOUNTER — Inpatient Hospital Stay (HOSPITAL_COMMUNITY): Payer: Medicare Other

## 2017-02-21 LAB — CBC WITH DIFFERENTIAL/PLATELET
BASOS PCT: 0 %
Basophils Absolute: 0 10*3/uL (ref 0.0–0.1)
Eosinophils Absolute: 0.2 10*3/uL (ref 0.0–0.7)
Eosinophils Relative: 1 %
HEMATOCRIT: 43.6 % (ref 39.0–52.0)
HEMOGLOBIN: 13.9 g/dL (ref 13.0–17.0)
Lymphocytes Relative: 3 %
Lymphs Abs: 0.4 10*3/uL — ABNORMAL LOW (ref 0.7–4.0)
MCH: 27.1 pg (ref 26.0–34.0)
MCHC: 31.9 g/dL (ref 30.0–36.0)
MCV: 85 fL (ref 78.0–100.0)
MONO ABS: 0.7 10*3/uL (ref 0.1–1.0)
MONOS PCT: 5 %
NEUTROS ABS: 11.5 10*3/uL — AB (ref 1.7–7.7)
NEUTROS PCT: 90 %
Platelets: 136 10*3/uL — ABNORMAL LOW (ref 150–400)
RBC: 5.13 MIL/uL (ref 4.22–5.81)
RDW: 18.8 % — AB (ref 11.5–15.5)
WBC: 12.8 10*3/uL — ABNORMAL HIGH (ref 4.0–10.5)

## 2017-02-21 LAB — BASIC METABOLIC PANEL
ANION GAP: 18 — AB (ref 5–15)
BUN: 95 mg/dL — ABNORMAL HIGH (ref 6–20)
CALCIUM: 8.5 mg/dL — AB (ref 8.9–10.3)
CHLORIDE: 115 mmol/L — AB (ref 101–111)
CO2: 21 mmol/L — AB (ref 22–32)
Creatinine, Ser: 6.64 mg/dL — ABNORMAL HIGH (ref 0.61–1.24)
GFR calc Af Amer: 8 mL/min — ABNORMAL LOW (ref >60)
GFR calc non Af Amer: 7 mL/min — ABNORMAL LOW (ref >60)
GLUCOSE: 83 mg/dL (ref 65–99)
Potassium: 4.2 mmol/L (ref 3.5–5.1)
Sodium: 154 mmol/L — ABNORMAL HIGH (ref 135–145)

## 2017-02-21 MED ORDER — FAT EMULSION 20 % IV EMUL
240.0000 mL | INTRAVENOUS | Status: AC
  Administered 2017-02-21: 240 mL via INTRAVENOUS
  Filled 2017-02-21: qty 250

## 2017-02-21 MED ORDER — AMIODARONE HCL IN DEXTROSE 360-4.14 MG/200ML-% IV SOLN
15.0000 mg/h | INTRAVENOUS | Status: DC
  Administered 2017-02-21 – 2017-02-23 (×3): 15 mg/h via INTRAVENOUS
  Filled 2017-02-21 (×2): qty 200

## 2017-02-21 MED ORDER — AMIODARONE HCL IN DEXTROSE 360-4.14 MG/200ML-% IV SOLN
15.0000 mg/h | INTRAVENOUS | Status: DC
  Filled 2017-02-21: qty 200

## 2017-02-21 MED ORDER — CLINIMIX E/DEXTROSE (5/15) 5 % IV SOLN
INTRAVENOUS | Status: AC
  Administered 2017-02-21: 18:00:00 via INTRAVENOUS
  Filled 2017-02-21: qty 960

## 2017-02-21 MED ORDER — AMIODARONE HCL IN DEXTROSE 360-4.14 MG/200ML-% IV SOLN: 60.0000 mg/h | INTRAVENOUS | Status: DC

## 2017-02-21 NOTE — Progress Notes (Signed)
PHARMACY - ADULT TOTAL PARENTERAL NUTRITION CONSULT NOTE   Pharmacy Consult for TPN Indication: Prolonged ileus  Patient Measurements: Height: 6' (182.9 cm) Weight: 240 lb 11.9 oz (109.2 kg) (Patient was 110.2kg on admission to step down unit) IBW/kg (Calculated) : 77.6 TPN AdjBW (KG): 83.1 Body mass index is 32.65 kg/m.  Assessment:  Pt admitted with abdominal pain, found to have perf of gastrojejunostomy from previous GI operation. Pt had an ex lap with closure of ulcer from pre-existing GJostomy on 4/8. Pt now has post-op ileus, planning to start TPN today  GI: TPN to start due to post-op ileus from perforated ulcer on ppi bid Endo: cbgs < 100 Insulin requirements in the past 24 hours: None Renal + Lytes: AKI SCr 1.2 > 6.6, Na 154, K 4.2  on 1/2NS @ 100  still making urine, but low uop Pulm:HFNC Cards: soft BP HR 90s on amio gtt + lopressor Hepatobil: LFTs/Tbili ok from 4/9 Neuro: ID: completed course of vanc/zosyn > ceftriaxone  Best Practices: SCDs TPN Access: R IJ TPN start date: 4/17  Nutritional Goals: pending kCal: Protein:   Current Nutrition: NPO  Plan:  Clinimix E 5/15 at 40 ml/hr - consider no E tomorrow 20% lipid emulsion at 20 ml/hr x 12 hr 1/2NS at 100 ml/hr - reduce when TPN starts This provides 48 g of protein and 1162 kCals per day  Add MVI to TPN Trace elements on Mon/Wed/Fri due to shortage Monitor TPN labs Consider changing fluids due to hypernatremia    Hughes Better, PharmD, BCPS Clinical Pharmacist 02/21/2017 11:56 AM

## 2017-02-21 NOTE — Progress Notes (Signed)
[facility] Progress Note  9 Days Post-Op  Subjective: CC: post-operative ileus Patient sitting up in bed on nasal cannula. Denies abdominal pain. Reports gluteal pain due to prolonged bedrest. Denies fever, chills, nausea, vomiting. Denies flatus or BM. Nurses unable to place NGT yesterday. Afebrile, intermittent tachycardia   Objective: Vital signs in last 24 hours: Temp:  [97.9 F (36.6 C)-98.8 F (37.1 C)] 98 F (36.7 C) (04/17 0727) Pulse Rate:  [52-130] 99 (04/17 0900) Resp:  [21-32] 29 (04/17 0900) BP: (79-132)/(54-87) 97/68 (04/17 0900) SpO2:  [90 %-98 %] 90 % (04/17 0838) FiO2 (%):  [50 %-100 %] 50 % (04/17 0838) Weight:  [109.2 kg (240 lb 11.9 oz)] 109.2 kg (240 lb 11.9 oz) (04/17 0757) Last BM Date: 02/17/17  Intake/Output from previous day: 04/16 0701 - 04/17 0700 In: 1422.7 [I.V.:1422.7] Out: 540 [Urine:375; Drains:165] Intake/Output this shift: Total I/O In: 464.5 [I.V.:464.5] Out: -   PE: Gen: Alert, NAD, pleasant and cooperative Card: Regular rate and rhythm Pulm: normal respiratory effort, Sats 91% on [place] Abd: Soft, non-tender, moderate distention, tympanic, minimal bowel sounds, no peritonitis  JP: 165 cc/24h SS  Ext: [place] in hands  Psych: A&Ox3  Lab Results:   Recent Labs  02/20/17 0551 02/21/17 0433  WBC 13.3* 12.8*  HGB 14.5 13.9  HCT 44.8 43.6  PLT PLATELET CLUMPS NOTED ON SMEAR, COUNT APPEARS DECREASED 136*   BMET  Recent Labs  02/20/17 1600 02/21/17 0433  NA 153* 154*  K 4.4 4.2  CL 119* 115*  CO2 19* 21*  GLUCOSE 92 83  BUN 90* 95*  CREATININE 6.16* 6.64*  CALCIUM 8.4* 8.5*   CMP     Component Value Date/Time   NA 154 (H) 02/21/2017 0433   NA 144 01/14/2015 1027   K 4.2 02/21/2017 0433   K 4.2 01/14/2015 1027   CL 115 (H) 02/21/2017 0433   CO2 21 (L) 02/21/2017 0433   CO2 28 01/14/2015 1027   GLUCOSE 83 02/21/2017 0433   GLUCOSE 131 01/14/2015 1027   BUN 95 (H) 02/21/2017 0433   BUN  19.2 01/14/2015 1027   CREATININE 6.64 (H) 02/21/2017 0433   CREATININE 1.3 01/14/2015 1027   CALCIUM 8.5 (L) 02/21/2017 0433   CALCIUM 10.0 01/14/2015 1027   PROT 4.6 (L) 02/13/2017 0350   PROT 7.1 01/14/2015 1027   ALBUMIN 2.3 (L) 02/13/2017 0350   ALBUMIN 3.9 01/14/2015 1027   AST 36 02/13/2017 0350   AST 17 01/14/2015 1027   ALT 33 02/13/2017 0350   ALT 12 01/14/2015 1027   ALKPHOS 45 02/13/2017 0350   ALKPHOS 96 01/14/2015 1027   BILITOT 1.1 02/13/2017 0350   BILITOT 0.97 01/14/2015 1027   GFRNONAA 7 (L) 02/21/2017 0433   GFRNONAA 43 (L) 10/24/2012 1327   GFRAA 8 (L) 02/21/2017 0433   GFRAA 49 (L) 10/24/2012 1327   Lipase     Component Value Date/Time   LIPASE 16 03/01/2017 2030   Studies/Results: Dg Chest Port 1 View  Result Date: 02/21/2017 CLINICAL DATA:  Dyspnea, current smoker, history of CHF and ischemic cardiomyopathy, COPD EXAM: PORTABLE CHEST 1 VIEW COMPARISON:  Portable chest x-ray of February 19, 2017 FINDINGS: The lungs remain well-expanded. The confluent interstitial and alveolar opacities persist. There small bilateral pleural effusions greatest on the right. The heart is top-normal in size. The pulmonary vascularity is indistinct. The ICD is in stable position. There is calcification in the wall of the thoracic aorta. The right internal jugular venous catheter  tip projects over the proximal SVC. IMPRESSION: Persistent patchy interstitial and alveolar opacities likely reflect pneumonia. There may be superimposed CHF though the pulmonary vascularity does not exhibit significant cephalization. Small bilateral pleural effusions. There has not been significant interval change since yesterday's study. Thoracic aortic atherosclerosis. Electronically Signed   By: David  Martinique M.D.   On: 02/21/2017 09:51   Dg Abd Portable 1v  Result Date: 02/21/2017 CLINICAL DATA:  81 year old male with abdominal distention. Leak from gastrojejunostomy. Subsequent encounter. EXAM: PORTABLE  ABDOMEN - 1 VIEW COMPARISON:  02/10/2017 CT. FINDINGS: Gas-filled dilated colon with cecum measuring up to 9.8 cm. No gas distended small bowel loops. Catheter projects over the abdomen/pelvis. Cannot assess for free intraperitoneal air on supine view. AICD in place. Vascular calcifications. IMPRESSION: Gas-filled dilated colon with cecum measuring up to 9.8 cm. No gas distended small bowel loops. Cannot assess for free intraperitoneal air on supine view. Electronically Signed   By: Genia Del M.D.   On: 02/21/2017 09:42    Anti-infectives: Anti-infectives    Start     Dose/Rate Route Frequency Ordered Stop   02/17/17 1300  cefTRIAXone (ROCEPHIN) 2 g in dextrose 5 % 50 mL IVPB     2 g 100 mL/hr over 30 Minutes Intravenous Every 24 hours 02/17/17 1029 02/19/17 1348   02/16/17 2330  vancomycin (VANCOCIN) 1,250 mg in sodium chloride 0.9 % 250 mL IVPB     1,250 mg 166.7 mL/hr over 90 Minutes Intravenous  Once 02/16/17 1100 02/17/17 0039   02/16/17 1000  vancomycin (VANCOCIN) IVPB 1000 mg/200 mL premix  Status:  Discontinued     1,000 mg 200 mL/hr over 60 Minutes Intravenous Every 48 hours 02/14/17 1159 02/15/17 1018   02/15/17 1700  anidulafungin (ERAXIS) 100 mg in sodium chloride 0.9 % 100 mL IVPB  Status:  Discontinued     100 mg over 90 Minutes Intravenous Every 24 hours 02/14/17 0943 02/17/17 1025   02/14/17 1000  vancomycin (VANCOCIN) 1,750 mg in sodium chloride 0.9 % 500 mL IVPB     1,750 mg 250 mL/hr over 120 Minutes Intravenous  Once 02/14/17 0948 02/14/17 1229   02/14/17 0945  anidulafungin (ERAXIS) 200 mg in sodium chloride 0.9 % 200 mL IVPB     200 mg over 180 Minutes Intravenous  Once 02/14/17 0943 02/14/17 2025   02/13/17 1330  fluconazole (DIFLUCAN) IVPB 200 mg  Status:  Discontinued     200 mg 100 mL/hr over 60 Minutes Intravenous Every 24 hours 02/13/17 0900 02/14/17 0943   02/15/2017 1400  piperacillin-tazobactam (ZOSYN) IVPB 3.375 g  Status:  Discontinued     3.375 g 12.5  mL/hr over 240 Minutes Intravenous Every 8 hours 03/01/2017 1329 02/17/17 1025   02/16/2017 1400  vancomycin (VANCOCIN) IVPB 750 mg/150 ml premix  Status:  Discontinued     750 mg 150 mL/hr over 60 Minutes Intravenous Every 12 hours 02/22/2017 1329 02/13/17 0900   02/17/2017 1330  fluconazole (DIFLUCAN) IVPB 400 mg  Status:  Discontinued     400 mg 100 mL/hr over 120 Minutes Intravenous Every 24 hours 02/25/2017 1254 02/13/17 0900   03/06/2017 1230  fluconazole (DIFLUCAN) IVPB 100 mg  Status:  Discontinued     100 mg 50 mL/hr over 60 Minutes Intravenous Every 24 hours 03/06/2017 1228 02/15/2017 1253   02/10/2017 0130  vancomycin (VANCOCIN) IVPB 1000 mg/200 mL premix     1,000 mg 200 mL/hr over 60 Minutes Intravenous  Once 02/06/2017 0118 02/08/2017 0235  02/06/2017 0115  piperacillin-tazobactam (ZOSYN) IVPB 3.375 g     3.375 g 100 mL/hr over 30 Minutes Intravenous  Once 02/22/2017 0108 03/01/2017 0134     Assessment/Plan REPAIR OF PERFORATED ULCER (N/A) POD 8graham patch perforation of marginal ulcer- 03/02/2017, Dr Brantley Stage - continue BID PPI  - continue to await further signs of bowel function - minimal flatus, one small BM ~4 d ago, distended abdomen - post-op ileus:NPO, IVF, start TPN; possible UGI this week to r/o leak - continue BID dressing changes  - IS/pulm toilet   Respiratory failure  RLL PNA - on abx as below COPD Tobacco use CHF  AKI -Cr 6.6 VTE: SubQ heparin ID: Ceftriaxone  Code status: DNI   Plan: start TPN for prolonged post-operative ileus If respiratory function continues to improve we may consider performing an UGI tomorrow/Thursday     LOS: 9 days    Jill Alexanders , [facility] 02/21/2017, 11:05 AM Pager: [phone] Consults: [phone] Mon-Fri 7:00 am-4:30 pm Sat-Sun 7:00 am-11:30 am

## 2017-02-21 NOTE — Plan of Care (Signed)
Problem: Cardiac: Goal: Ability to achieve and maintain adequate cardiopulmonary perfusion will improve Outcome: Not Progressing Patient had multiple runs of SVT, Amiodarone drip was restarted at 0930

## 2017-02-21 NOTE — Progress Notes (Addendum)
PROGRESS NOTE    Jesus Sanders  ASN:[id number] DOB: 1936-04-13 DOA: 02/18/2017 PCP: Leonard Downing, MD    Brief Narrative:  81 yo male who presented to the hospital with the chief complain of abdominal pain. Acute and severe pain, patient requested to be brought to the hospital. Pain localized at the LUQ. On the initial physical examination patient was found hypotensive 92/68, and tachycardic 101 bpm. Abdomen with significant tenderness. CT of the abdomen with pneumoperitoneum, suggestive perforation from ulcer at the jejunum at the site of prior gastrojejunostomy. Patient was intervened with laparotomy finding perforated marginal ulcer at pre-existing gastrojejunostomy. Patient developed post operative respiratory failure, remained on invasive mechanical ventilation. Extubated on 04/09 to be re-intubated on 04/10. Complicated course with hypotension requiring vasopressors. Positive right lower lobe pneumonia. Atrial fibrilaltion on amiodarone infusion. Worsenign renal failure with AKI. Extubated 04/12, transferred to step down unit on 04/14. Placed on bipap due to increased work of breathing. On fentanyl as needed. Noted worsening renal failure and started on IV fluids.    Assessment & Plan:   Principal Problem:   Bowel perforation (HCC) Active Problems:   CAD (coronary artery disease)   Chronic systolic heart failure (HCC)   Essential hypertension   Ischemic cardiomyopathy   Automatic implantable cardioverter-defibrillator- medtronic   CAP (community acquired pneumonia)   Lactic acidosis   Pressure injury of skin   Wide-complex tachycardia ([place])   1. Acute hypoxic respiratory failure. Will continue to use bipap intermittently, alternate with high flow nasal cannula or non rebreather, target target 02 saturation above 92%, will follow on chest film. Patient very deconditioned and poor respiratory reserve, per family and patient's advance directive no invasive mechanical  ventilation. Continue to be NPO for now. High risk for ARDS.   2. Right lower lobe pneumonia. Patient has been completed IV antibiotic regimen, will continue supportive care. Follow on chest film.   3. Atrial fibrillation. Patient had worsening afib off the amiodarone, will resume infusion at last rate, close monitor of telemetry. Patient had episodes of VT, will keep Mg at 2 and K at 4. Holding anticoagulation for now, recent surgery for perforated viscus. Continue scheduled metoprolol. Echocardiography with EF 40 to 45%, akinesis of the apical anterior and septal.    4. AKI. Worsening renal function with cr at 6.6, will continue IV fluids, noted low urine Na, patient has been NPO and clinically hypovolemic. K at 4,2 and serum bicarbonate at 21.   5. Perforated ulcer at a prior gastrojejunostomy. Unable to tolerate NG placement,  no further nausea or vomiting. Continue to follow on surgery recommendations. Patient with poor prognosis.   DVT prophylaxis:heparin sq Code Status: DNR Family Communication: No family at the bedside.  Disposition Plan:to be determined.   Consultants:  Surgery   Procedures: Exploratory laparotomy with closure of marginal ulcer. 04/08   Antimicrobials:   Ceftriaxone 04/13   Subjective: Patient reports feeling better and denies significant dyspnea, despite evident increase work of breathing. Unable to place NG tube, patient not tolerating procedure. No recent bowel movement and positive abdominal distention.   Objective: Vitals:   02/21/17 0341 02/21/17 0727 02/21/17 0757 02/21/17 0838  BP: 123/66 90/67  (!) 79/64  Pulse: (!) 52 88  (!) 130  Resp: (!) 28 (!) 32 (!) 29 (!) 30  Temp: 98 F (36.7 C) 98 F (36.7 C)    TempSrc: Axillary Axillary    SpO2: 96% 92% 91% 90%  Weight:   109.2 kg (240 lb 11.9 oz)  Height:        Intake/Output Summary (Last 24 hours) at 02/21/17 0855 Last data filed at 02/21/17 0757  Gross per 24 hour  Intake           1877.18 ml  Output              540 ml  Net          1337.18 ml   Filed Weights   02/14/2017 2001 02/15/17 0545 02/21/17 0757  Weight: 99.8 kg (220 lb) (!) 170.6 kg (376 lb) 109.2 kg (240 lb 11.9 oz)    Examination:  General exam: deconditioned and ill looking appearing.  E ENT: Patient on full face mask bipap with dry oral mucosa. Positive pallor but no icterus.  Respiratory system: Scattered rhonchi, with bibasilar rales. No wheezing, noted increase use of accessory muscles.  R Cardiovascular system: S1 & S2 heard, tachycardic irregulary irregular. No JVD, murmurs, rubs, gallops or clicks. Trace pedal edema. Gastrointestinal system: Abdomen is nondistended, soft and nontender. No organomegaly or masses felt. Normal bowel sounds heard. Central nervous system: Alert and oriented. No focal neurological deficits.  Extremities: Symmetric 5 x 5 power. Skin: No rashes, lesions or ulcers.      Data Reviewed: I have personally reviewed following labs and imaging studies  CBC:  Recent Labs Lab 02/16/17 0311 02/17/17 0300 02/19/17 1348 02/20/17 0551 02/21/17 0433  WBC 7.9 7.2 12.3* 13.3* 12.8*  NEUTROABS  --   --  11.0* 12.1* 11.5*  HGB 13.2 13.6 14.8 14.5 13.9  HCT 41.1 42.8 45.8 44.8 43.6  MCV 86.0 85.8 85.9 84.5 85.0  PLT 79* 78* 138* PLATELET CLUMPS NOTED ON SMEAR, COUNT APPEARS DECREASED 947*   Basic Metabolic Panel:  Recent Labs Lab 02/15/17 0306 02/16/17 0311 02/17/17 0300 02/19/17 1348 02/20/17 1600 02/21/17 0433  NA 142 143 145 147* 153* 154*  K 3.4* 3.8 4.0 5.5* 4.4 4.2  CL 113* 111 110 112* 119* 115*  CO2 '22 24 23 '$ 21* 19* 21*  GLUCOSE 121* 107* 75 88 92 83  BUN 45* 47* 51* 81* 90* 95*  CREATININE 3.70* 4.13* 4.28* 4.55* 6.16* 6.64*  CALCIUM 8.4* 8.2* 8.2* 8.1* 8.4* 8.5*  MG 1.9  --   --   --   --   --   PHOS 2.8  --   --   --   --   --    GFR: Estimated Creatinine Clearance: 11.7 mL/min (A) (by C-G formula based on SCr of 6.64 mg/dL (H)). Liver  Function Tests: No results for input(s): AST, ALT, ALKPHOS, BILITOT, PROT, ALBUMIN in the last 168 hours. No results for input(s): LIPASE, AMYLASE in the last 168 hours. No results for input(s): AMMONIA in the last 168 hours. Coagulation Profile: No results for input(s): INR, PROTIME in the last 168 hours. Cardiac Enzymes: No results for input(s): CKTOTAL, CKMB, CKMBINDEX, TROPONINI in the last 168 hours. BNP (last 3 results) No results for input(s): PROBNP in the last 8760 hours. HbA1C: No results for input(s): HGBA1C in the last 72 hours. CBG: No results for input(s): GLUCAP in the last 168 hours. Lipid Profile: No results for input(s): CHOL, HDL, LDLCALC, TRIG, CHOLHDL, LDLDIRECT in the last 72 hours. Thyroid Function Tests: No results for input(s): TSH, T4TOTAL, FREET4, T3FREE, THYROIDAB in the last 72 hours. Anemia Panel: No results for input(s): VITAMINB12, FOLATE, FERRITIN, TIBC, IRON, RETICCTPCT in the last 72 hours. Sepsis Labs:  Recent Labs Lab 02/14/17 1147 02/14/17 1411  LATICACIDVEN 0.8  0.9    Recent Results (from the past 240 hour(s))  Culture, blood (Routine X 2) w Reflex to ID Panel     Status: None   Collection Time: 03/03/2017 12:28 PM  Result Value Ref Range Status   Specimen Description BLOOD RIGHT HAND  Final   Special Requests IN PEDIATRIC BOTTLE Blood Culture adequate volume  Final   Culture NO GROWTH 5 DAYS  Final   Report Status 02/17/2017 FINAL  Final  Culture, blood (Routine X 2) w Reflex to ID Panel     Status: None   Collection Time: 02/09/2017  4:16 PM  Result Value Ref Range Status   Specimen Description BLOOD A-LINE  Final   Special Requests   Final    BOTTLES DRAWN AEROBIC AND ANAEROBIC Blood Culture results may not be optimal due to an excessive volume of blood received in culture bottles   Culture NO GROWTH 5 DAYS  Final   Report Status 02/17/2017 FINAL  Final  MRSA PCR Screening     Status: None   Collection Time: 02/22/2017  7:01 PM    Result Value Ref Range Status   MRSA by PCR NEGATIVE NEGATIVE Final    Comment:        The GeneXpert MRSA Assay (FDA approved for NASAL specimens only), is one component of a comprehensive MRSA colonization surveillance program. It is not intended to diagnose MRSA infection nor to guide or monitor treatment for MRSA infections.   Culture, respiratory (NON-Expectorated)     Status: None   Collection Time: 02/15/17  6:42 AM  Result Value Ref Range Status   Specimen Description TRACHEAL ASPIRATE  Final   Special Requests NONE  Final   Gram Stain   Final    MODERATE WBC PRESENT,BOTH PMN AND MONONUCLEAR NO ORGANISMS SEEN    Culture RARE CANDIDA ALBICANS  Final   Report Status 02/19/2017 FINAL  Final         Radiology Studies: Dg Chest Port 1 View  Result Date: 02/19/2017 CLINICAL DATA:  Dyspnea. EXAM: PORTABLE CHEST 1 VIEW COMPARISON:  Two-view chest x-ray 02/17/2017 FINDINGS: A right IJ catheter is stable.  The NG tube has been removed. Heart is enlarged. Progressive bilateral lower lobe airspace disease is present, right greater than left. Increasing left upper lobe airspace disease present as well. A diffuse interstitial pattern is noted. Bilateral pleural effusions are suspected. Left upper lobe mass is partially obscured by the pacemaker. IMPRESSION: 1. Cardiomegaly with increasing interstitial edema and bilateral effusions likely reflecting congestive heart failure. 2. Superimposed airspace consolidation in the lower lobes bilaterally, right greater than left, and a left upper lobe. While this may represent progressive edema, lobar pneumonia is also considered. 3. Left upper lobe mass is partially obscured by the pacemaker. Electronically Signed   By: San Morelle M.D.   On: 02/19/2017 12:04        Scheduled Meds: . arformoterol  15 mcg Nebulization BID  . budesonide (PULMICORT) nebulizer solution  0.5 mg Nebulization BID  . heparin subcutaneous  5,000 Units  Subcutaneous Q8H  . ipratropium-albuterol  3 mL Nebulization Q6H  . mouth rinse  15 mL Mouth Rinse BID  . metoprolol  2.5 mg Intravenous Q6H  . pantoprazole (PROTONIX) IV  40 mg Intravenous Q12H  . sodium chloride flush  10-40 mL Intracatheter Q12H  . sodium chloride flush  3 mL Intravenous Q12H   Continuous Infusions: . sodium chloride 100 mL/hr at 02/21/17 0428  . sodium chloride 10 mL/hr  at 02/19/17 0058  . amiodarone       LOS: 9 days      Toussaint Golson Gerome Apley, MD [facility] Pager [phone]  If 7PM-7AM, please contact night-coverage [email or web address] Password [facility] 02/21/2017, 8:55 AM

## 2017-02-22 ENCOUNTER — Inpatient Hospital Stay (HOSPITAL_COMMUNITY): Payer: Medicare Other

## 2017-02-22 DIAGNOSIS — R0603 Acute respiratory distress: Secondary | ICD-10-CM

## 2017-02-22 DIAGNOSIS — I714 Abdominal aortic aneurysm, without rupture: Secondary | ICD-10-CM

## 2017-02-22 LAB — CBC WITH DIFFERENTIAL/PLATELET
BASOS ABS: 0 10*3/uL (ref 0.0–0.1)
Basophils Relative: 0 %
Eosinophils Absolute: 0.3 10*3/uL (ref 0.0–0.7)
Eosinophils Relative: 2 %
HCT: 44.9 % (ref 39.0–52.0)
HEMOGLOBIN: 14.1 g/dL (ref 13.0–17.0)
LYMPHS PCT: 3 %
Lymphs Abs: 0.4 10*3/uL — ABNORMAL LOW (ref 0.7–4.0)
MCH: 27.1 pg (ref 26.0–34.0)
MCHC: 31.4 g/dL (ref 30.0–36.0)
MCV: 86.2 fL (ref 78.0–100.0)
Monocytes Absolute: 0.8 10*3/uL (ref 0.1–1.0)
Monocytes Relative: 5 %
NEUTROS ABS: 14.2 10*3/uL — AB (ref 1.7–7.7)
Neutrophils Relative %: 90 %
Platelets: 142 10*3/uL — ABNORMAL LOW (ref 150–400)
RBC: 5.21 MIL/uL (ref 4.22–5.81)
RDW: 19.3 % — ABNORMAL HIGH (ref 11.5–15.5)
WBC: 15.7 10*3/uL — AB (ref 4.0–10.5)

## 2017-02-22 LAB — COMPREHENSIVE METABOLIC PANEL
ALK PHOS: 60 U/L (ref 38–126)
ALT: 13 U/L — ABNORMAL LOW (ref 17–63)
AST: 17 U/L (ref 15–41)
Albumin: 1.9 g/dL — ABNORMAL LOW (ref 3.5–5.0)
Anion gap: 12 (ref 5–15)
BILIRUBIN TOTAL: 0.7 mg/dL (ref 0.3–1.2)
BUN: 105 mg/dL — AB (ref 6–20)
CHLORIDE: 115 mmol/L — AB (ref 101–111)
CO2: 24 mmol/L (ref 22–32)
CREATININE: 6.51 mg/dL — AB (ref 0.61–1.24)
Calcium: 8.4 mg/dL — ABNORMAL LOW (ref 8.9–10.3)
GFR calc Af Amer: 8 mL/min — ABNORMAL LOW (ref >60)
GFR, EST NON AFRICAN AMERICAN: 7 mL/min — AB (ref >60)
Glucose, Bld: 126 mg/dL — ABNORMAL HIGH (ref 65–99)
Potassium: 4.3 mmol/L (ref 3.5–5.1)
Sodium: 151 mmol/L — ABNORMAL HIGH (ref 135–145)
Total Protein: 5.1 g/dL — ABNORMAL LOW (ref 6.5–8.1)

## 2017-02-22 LAB — PREALBUMIN: PREALBUMIN: 6.6 mg/dL — AB (ref 18–38)

## 2017-02-22 LAB — PHOSPHORUS: Phosphorus: 4.8 mg/dL — ABNORMAL HIGH (ref 2.5–4.6)

## 2017-02-22 LAB — MAGNESIUM: Magnesium: 2.6 mg/dL — ABNORMAL HIGH (ref 1.7–2.4)

## 2017-02-22 LAB — TRIGLYCERIDES: Triglycerides: 256 mg/dL — ABNORMAL HIGH (ref <150)

## 2017-02-22 MED ORDER — IOPAMIDOL (ISOVUE-300) INJECTION 61%
15.0000 mL | INTRAVENOUS | Status: AC
  Administered 2017-02-22 (×2): 15 mL via ORAL

## 2017-02-22 MED ORDER — TRACE MINERALS CR-CU-MN-SE-ZN 10-1000-500-60 MCG/ML IV SOLN
INTRAVENOUS | Status: AC
  Administered 2017-02-22: 18:00:00 via INTRAVENOUS
  Filled 2017-02-22: qty 1440

## 2017-02-22 MED ORDER — FAT EMULSION 20 % IV EMUL
240.0000 mL | INTRAVENOUS | Status: AC
  Administered 2017-02-22: 240 mL via INTRAVENOUS
  Filled 2017-02-22: qty 250

## 2017-02-22 MED ORDER — FLEET ENEMA 7-19 GM/118ML RE ENEM: 1.0000 | ENEMA | Freq: Once | RECTAL | Status: DC

## 2017-02-22 NOTE — Progress Notes (Signed)
[facility]  PROGRESS NOTE  Jesus Sanders EPP:[id number] DOB: 1936/03/06 DOA: 02/27/2017 PCP: Leonard Downing, MD   Brief HPI:    81 yo male who presented to the hospital with the chief complain of abdominal pain. Acute and severe pain, patient requested to be brought to the hospital. Pain localized at the LUQ. On the initial physical examination patient was found hypotensive 92/68, and tachycardic 101 bpm. Abdomen with significant tenderness. CT of the abdomen with pneumoperitoneum, suggestive perforation from ulcer at the jejunum at the site of prior gastrojejunostomy. Patient was intervened with laparotomy finding perforated marginal ulcer at pre-existing gastrojejunostomy. Patient developed post operative respiratory failure, remained on invasive mechanical ventilation. Extubated on 04/09 to be re-intubated on 04/10. Complicated course with hypotension requiring vasopressors. Positive right lower lobe pneumonia. Atrial fibrilaltion on amiodarone infusion. Worsenign renal failure with AKI. Extubated 04/12, transferred to step down unit on 04/14. Placed on bipap due to increased work of breathing. On fentanyl as needed. Noted worsening renal failure and started on IV fluids   Subjective   Patient seen and examined, continues to have shortness of breath.   Assessment/Plan:     1. Acute hypoxic respiratory failure-Patient continues to have dyspnea, currently on when necessary BiPAP. Patient is a DO NOT RESUSCITATE so no intubation. Continue bronchodilator therapy as needed. 2. Right lobar lobe  Pneumonia- patient has completed IV antibiotic regimen. Continue supportive care. 3. Atrial fibrillation- patient was started on amiodarone infusion for atrial fibrillation. Also has been having episodes of VT. Anti-coagulation currently on hold due to recent surgery for perforated viscus. Continue scheduled metoprolol. Echo showed EF 40-45%. 4. Acute kidney injury- patient has had gradual  worsening of creatinine, today creatinine is 6.51. He is not a dialysis candidate, I called and discussed with patient's family, will get nephrology consultation for their input. 5. Perforated ulcer at the prior gastrojejunostomy- general surgery following.    DVT prophylaxis: Heparin  Code Status: DO NOT RESUSCITATE  Family Communication: Discussed with patient's daughters on phone, regarding poor prognosis. They will discuss among themselves to consider palliative care consultation.   Disposition Plan: To be decided, based on family's decision    Consultants:  General surgery  Procedures:  Exploratory laparotomy with closure of marginal ulcer, 4/08  Continuous infusions . sodium chloride 1 mL (02/21/17 1610)  . sodium chloride 10 mL/hr at 02/19/17 0058  . amiodarone 15 mg/hr (02/22/17 6063)  . TPN (CLINIMIX) Adult without lytes     And  . fat emulsion    . Marland KitchenTPN (CLINIMIX-E) Adult 40 mL/hr at 02/21/17 1751      Antibiotics:   Anti-infectives    Start     Dose/Rate Route Frequency Ordered Stop   02/17/17 1300  cefTRIAXone (ROCEPHIN) 2 g in dextrose 5 % 50 mL IVPB     2 g 100 mL/hr over 30 Minutes Intravenous Every 24 hours 02/17/17 1029 02/19/17 1348   02/16/17 2330  vancomycin (VANCOCIN) 1,250 mg in sodium chloride 0.9 % 250 mL IVPB     1,250 mg 166.7 mL/hr over 90 Minutes Intravenous  Once 02/16/17 1100 02/17/17 0039   02/16/17 1000  vancomycin (VANCOCIN) IVPB 1000 mg/200 mL premix  Status:  Discontinued     1,000 mg 200 mL/hr over 60 Minutes Intravenous Every 48 hours 02/14/17 1159 02/15/17 1018   02/15/17 1700  anidulafungin (ERAXIS) 100 mg in sodium chloride 0.9 % 100 mL IVPB  Status:  Discontinued     100 mg over 90 Minutes Intravenous  Every 24 hours 02/14/17 0943 02/17/17 1025   02/14/17 1000  vancomycin (VANCOCIN) 1,750 mg in sodium chloride 0.9 % 500 mL IVPB     1,750 mg 250 mL/hr over 120 Minutes Intravenous  Once 02/14/17 0948 02/14/17 1229   02/14/17  0945  anidulafungin (ERAXIS) 200 mg in sodium chloride 0.9 % 200 mL IVPB     200 mg over 180 Minutes Intravenous  Once 02/14/17 0943 02/14/17 2025   02/13/17 1330  fluconazole (DIFLUCAN) IVPB 200 mg  Status:  Discontinued     200 mg 100 mL/hr over 60 Minutes Intravenous Every 24 hours 02/13/17 0900 02/14/17 0943   02/17/2017 1400  piperacillin-tazobactam (ZOSYN) IVPB 3.375 g  Status:  Discontinued     3.375 g 12.5 mL/hr over 240 Minutes Intravenous Every 8 hours 03/02/2017 1329 02/17/17 1025   02/23/2017 1400  vancomycin (VANCOCIN) IVPB 750 mg/150 ml premix  Status:  Discontinued     750 mg 150 mL/hr over 60 Minutes Intravenous Every 12 hours 03/05/2017 1329 02/13/17 0900   02/26/2017 1330  fluconazole (DIFLUCAN) IVPB 400 mg  Status:  Discontinued     400 mg 100 mL/hr over 120 Minutes Intravenous Every 24 hours 02/18/2017 1254 02/13/17 0900   02/05/2017 1230  fluconazole (DIFLUCAN) IVPB 100 mg  Status:  Discontinued     100 mg 50 mL/hr over 60 Minutes Intravenous Every 24 hours 02/26/2017 1228 02/06/2017 1253   03/04/2017 0130  vancomycin (VANCOCIN) IVPB 1000 mg/200 mL premix     1,000 mg 200 mL/hr over 60 Minutes Intravenous  Once 02/22/2017 0118 02/21/2017 0235   03/05/2017 0115  piperacillin-tazobactam (ZOSYN) IVPB 3.375 g     3.375 g 100 mL/hr over 30 Minutes Intravenous  Once 02/10/2017 0108 02/16/2017 0134       Objective   Vitals:   02/22/17 0417 02/22/17 0800 02/22/17 0830 02/22/17 1200  BP: 119/67 104/60  (!) 87/57  Pulse: 79 71  73  Resp: (!) 27 (!) 25  (!) 26  Temp: 97.6 F (36.4 C) 97.1 F (36.2 C)  97.1 F (36.2 C)  TempSrc: Oral Axillary  Axillary  SpO2: 96%  97%   Weight:      Height:        Intake/Output Summary (Last 24 hours) at 02/22/17 1250 Last data filed at 02/22/17 1223  Gross per 24 hour  Intake          2671.97 ml  Output              555 ml  Net          2116.97 ml   Filed Weights   02/05/2017 2001 02/15/17 0545 02/21/17 0757  Weight: 99.8 kg (220 lb) (!) 170.6 kg (376  lb) 109.2 kg (240 lb 11.9 oz)     Physical Examination:  General exam: Appears calm and comfortable. Respiratory system: Clear to auscultation. Respiratory effort normal. Cardiovascular system:  RRR. No  murmurs, rubs, gallops. No pedal edema. GI system: Abdomen is nondistended, soft and nontender. No organomegaly.  Central nervous system. No focal neurological deficits. 5 x 5 power in all extremities. Skin: No rashes, lesions or ulcers. Psychiatry: Alert, oriented x 3.Judgement and insight appear normal. Affect normal.    Data Reviewed: I have personally reviewed following labs and imaging studies  CBG: No results for input(s): GLUCAP in the last 168 hours.  CBC:  Recent Labs Lab 02/17/17 0300 02/19/17 1348 02/20/17 0551 02/21/17 0433 02/22/17 0238  WBC 7.2 12.3* 13.3* 12.8*  15.7*  NEUTROABS  --  11.0* 12.1* 11.5* 14.2*  HGB 13.6 14.8 14.5 13.9 14.1  HCT 42.8 45.8 44.8 43.6 44.9  MCV 85.8 85.9 84.5 85.0 86.2  PLT 78* 138* PLATELET CLUMPS NOTED ON SMEAR, COUNT APPEARS DECREASED 136* 142*    Basic Metabolic Panel:  Recent Labs Lab 02/17/17 0300 02/19/17 1348 02/20/17 1600 02/21/17 0433 02/22/17 0238  NA 145 147* 153* 154* 151*  K 4.0 5.5* 4.4 4.2 4.3  CL 110 112* 119* 115* 115*  CO2 23 21* 19* 21* 24  GLUCOSE 75 88 92 83 126*  BUN 51* 81* 90* 95* 105*  CREATININE 4.28* 4.55* 6.16* 6.64* 6.51*  CALCIUM 8.2* 8.1* 8.4* 8.5* 8.4*  MG  --   --   --   --  2.6*  PHOS  --   --   --   --  4.8*    Recent Results (from the past 240 hour(s))  Culture, blood (Routine X 2) w Reflex to ID Panel     Status: None   Collection Time: 02/23/2017  4:16 PM  Result Value Ref Range Status   Specimen Description BLOOD A-LINE  Final   Special Requests   Final    BOTTLES DRAWN AEROBIC AND ANAEROBIC Blood Culture results may not be optimal due to an excessive volume of blood received in culture bottles   Culture NO GROWTH 5 DAYS  Final   Report Status 02/17/2017 FINAL  Final   MRSA PCR Screening     Status: None   Collection Time: 03/01/2017  7:01 PM  Result Value Ref Range Status   MRSA by PCR NEGATIVE NEGATIVE Final    Comment:        The GeneXpert MRSA Assay (FDA approved for NASAL specimens only), is one component of a comprehensive MRSA colonization surveillance program. It is not intended to diagnose MRSA infection nor to guide or monitor treatment for MRSA infections.   Culture, respiratory (NON-Expectorated)     Status: None   Collection Time: 02/15/17  6:42 AM  Result Value Ref Range Status   Specimen Description TRACHEAL ASPIRATE  Final   Special Requests NONE  Final   Gram Stain   Final    MODERATE WBC PRESENT,BOTH PMN AND MONONUCLEAR NO ORGANISMS SEEN    Culture RARE CANDIDA ALBICANS  Final   Report Status 02/19/2017 FINAL  Final     Liver Function Tests:  Recent Labs Lab 02/22/17 0238  AST 17  ALT 13*  ALKPHOS 60  BILITOT 0.7  PROT 5.1*  ALBUMIN 1.9*    BNP (last 3 results)  Recent Labs  08/30/16 1055 03/01/2017 2256  BNP 78.5 107.5*    ProBNP (last 3 results) No results for input(s): PROBNP in the last 8760 hours.    Studies: Ct Abdomen Pelvis Wo Contrast  Result Date: 02/22/2017 CLINICAL DATA:  Leukocytosis. Prolonged colonic ileus. 9 days postop closure and Graham patch of perforated ulcer EXAM: CT ABDOMEN AND PELVIS WITHOUT CONTRAST TECHNIQUE: Multidetector CT imaging of the abdomen and pelvis was performed following the standard protocol without IV contrast. COMPARISON:  02/06/2017 FINDINGS: Lower chest: Lung bases shows small bilateral pleural effusion. There is worsening consolidation with some air bronchogram. There is patchy airspace opacification in right lower lobe. Findings are highly suspicious for asymmetric pneumonia. Atelectasis noted in left lower lobe posteriorly. Hepatobiliary: Again noted status post cholecystectomy. Unenhanced liver shows no biliary ductal dilatation. Low-density lesion in the 4 B  segment of the liver Pancreas: Again noted  atrophic partially replaced pancreas. No focal pancreatic abnormality. Spleen: Unenhanced spleen shows no focal abnormality. Adrenals/Urinary Tract: Stable left adrenal mass measures 2.5 x 2.7 cm There is mild left hydronephrosis. Minimal dilatation of the left ureter without frank hydroureter. Minimal dilatation of the right ureter without frank hydroureter. No right hydronephrosis. No calcified ureteral calculi. There is moderate distended urinary bladder without filling defects. Stomach/Bowel: Stable postsurgical changes post gastro jejunostomy. The gastrojejunostomy is patent. Contrast material noted within small bowel. No evidence of small bowel obstruction. There is moderate distension of the right colon with gas up to 6.5 cm. Some contrast air-fluid levels are noted within right colon. Moderate gaseous distension of the transverse colon. Findings highly suspicious for proximal colonic ileus. Splenic flexure of the colon is collapsed. Proximal and distal left colon is collapsed. Minimal gas noted in proximal sigmoid colon. Sigmoid stir mid sigmoid colon is empty collapsed. Some colonic gas and stool noted in distal sigmoid colon/rectum. Vascular/Lymphatic: Stable infrarenal abdominal aortic aneurysm measures 3.3 cm. No retroperitoneal or mesenteric adenopathy. Extensive atherosclerotic calcifications of abdominal aorta. Reproductive: There is mild enlarged prostate gland measures 7.4 by 5.9 cm with indentation of urinary bladder base. There is a Foley catheter with balloon inflated mid aspect of prostate gland. The Foley catheter should be advanced into the bladder. Other: There is anasarca infiltration of subcutaneous fat bilateral flank wall and pelvic wall. There is midline anterior abdominal scarring with incomplete closure of superficially open skin defect. Clinical correlation is necessary. Two postsurgical drains are noted in lower abdomen. No evidence of  mesenteric fluid collection or mesenteric abscess. Musculoskeletal: No destructive bony lesions are noted. Sagittal images of the spine shows diffuse osteopenia. Degenerative changes lower thoracic spine. Degenerative changes lump lower lumbar spine at L5-S1 level. IMPRESSION: 1. There is bilateral small pleural effusion. Stable calcified pleural plaques in right lower lobe posteriorly. There is consolidation with air bronchogram in right lower lobe posteriorly. Additional patchy airspace opacification and interstitial prominence noted in right lower lobe. Findings highly suspicious for asymmetric pneumonia. Clinical correlation is necessary. There is atelectasis left lower lobe posteriorly. 2. Mild left hydronephrosis. Minimal distension of the left ureter without frank hydroureter. Minimal distension of the right ureter without frank hydroureter. No calcified ureteral calculi are noted. 3. Moderate distended urinary bladder. No bladder filling defects. There is a Foley catheter with balloon inflated mid aspect of the prostate gland. The balloon should be deflated and the catheter should be advanced into urinary bladder. 4. Stable postsurgical changes post gastro jejunostomy. No evidence of stricture. No evidence of small bowel obstruction. Contrast material noted in right colon. There is moderate distension of the right colon with some air contrast levels. The largest measures 6.5 cm in diameter. Findings highly suspicious for colonic ileus. Moderate gaseous distension of the transverse colon. The splenic flexure of the colon is decompressed collapsed. Partially collapsed descending colon. Majority of sigmoid colon empty collapsed. Minimal residual stool noted within rectum. 5. There is anasarca infiltration of subcutaneous fat abdominal and pelvic wall. 6. Postsurgical 2 drains are noted in lower abdomen. No mesenteric abscess or mesenteric fluid collection noted on this unenhanced scan. There is a midline  abdominal wall scarring with incomplete closure superficially with open skin defect. Please see sagittal image 65. Clinical correlation is necessary. No subcutaneous abscess or fluid collection. Electronically Signed   By: Lahoma Crocker M.D.   On: 02/22/2017 12:25   Dg Chest Port 1 View  Result Date: 02/21/2017 CLINICAL DATA:  Dyspnea, current  smoker, history of CHF and ischemic cardiomyopathy, COPD EXAM: PORTABLE CHEST 1 VIEW COMPARISON:  Portable chest x-ray of February 19, 2017 FINDINGS: The lungs remain well-expanded. The confluent interstitial and alveolar opacities persist. There small bilateral pleural effusions greatest on the right. The heart is top-normal in size. The pulmonary vascularity is indistinct. The ICD is in stable position. There is calcification in the wall of the thoracic aorta. The right internal jugular venous catheter tip projects over the proximal SVC. IMPRESSION: Persistent patchy interstitial and alveolar opacities likely reflect pneumonia. There may be superimposed CHF though the pulmonary vascularity does not exhibit significant cephalization. Small bilateral pleural effusions. There has not been significant interval change since yesterday's study. Thoracic aortic atherosclerosis. Electronically Signed   By: David  Martinique M.D.   On: 02/21/2017 09:51   Dg Abd Portable 1v  Result Date: 02/21/2017 CLINICAL DATA:  82 year old male with abdominal distention. Leak from gastrojejunostomy. Subsequent encounter. EXAM: PORTABLE ABDOMEN - 1 VIEW COMPARISON:  02/08/2017 CT. FINDINGS: Gas-filled dilated colon with cecum measuring up to 9.8 cm. No gas distended small bowel loops. Catheter projects over the abdomen/pelvis. Cannot assess for free intraperitoneal air on supine view. AICD in place. Vascular calcifications. IMPRESSION: Gas-filled dilated colon with cecum measuring up to 9.8 cm. No gas distended small bowel loops. Cannot assess for free intraperitoneal air on supine view. Electronically  Signed   By: Genia Del M.D.   On: 02/21/2017 09:42    Scheduled Meds: . arformoterol  15 mcg Nebulization BID  . budesonide (PULMICORT) nebulizer solution  0.5 mg Nebulization BID  . heparin subcutaneous  5,000 Units Subcutaneous Q8H  . ipratropium-albuterol  3 mL Nebulization Q6H  . mouth rinse  15 mL Mouth Rinse BID  . metoprolol  2.5 mg Intravenous Q6H  . pantoprazole (PROTONIX) IV  40 mg Intravenous Q12H  . sodium chloride flush  10-40 mL Intracatheter Q12H  . sodium chloride flush  3 mL Intravenous Q12H      Time spent: 25 min  [facility] Pager [phone]. If 7PM-7AM, please contact night-coverage at [email or web address], Office  [phone]  password [email or web address] 02/22/2017, 12:50 PM  LOS: 10 days

## 2017-02-22 NOTE — Progress Notes (Addendum)
10 Days Post-Op  Subjective: Alert and cooperative.  A little confused and obviously deconditioned Stable.  Afebrile.  Heart rate 71. WBC creeping up to 15,000 Creatinine up to 6.51.  BUN 105 sodium 151 glucose 126  Chest x-ray yesterday showed possible pneumonia with possible CHF superimposed Abdominal x-ray shows colonic ileus. No stools recorded  Care management discussed with Dr. Darrick Meigs  Concern for pneumonia and CHF.  Concern for progressive acute renal failure. Plan CT abdomen today.          NPO/TNA   Objective: Vital signs in last 24 hours: Temp:  [97 F (36.1 C)-98 F (36.7 C)] 97.1 F (36.2 C) (04/18 0800) Pulse Rate:  [71-130] 71 (04/18 0800) Resp:  [13-30] 25 (04/18 0800) BP: (79-119)/(60-69) 104/60 (04/18 0800) SpO2:  [86 %-98 %] 96 % (04/18 0417) FiO2 (%):  [50 %] 50 % (04/17 0838) Last BM Date: 02/17/17  Intake/Output from previous day: 04/17 0701 - 04/18 0700 In: 2372.2 [I.V.:2312.2] Out: 615 [Urine:525; Drains:90] Intake/Output this shift: Total I/O In: 825.8 [I.V.:825.8] Out: -     PE: Gen: Alert, NAD, pleasant and cooperative.  Looks quite deconditioned. Card: Regular rate and rhythm Pulm: normal respiratory effort, Sats 91-96% on Steen.  Cough is productive.  Rhonchi at bases. Abd: Soft, minimal-tender, moderate distention, tympanic, minimal bowel sounds, no peritonitis  JP: 165cc/24h -both drains are serous, no odor.  Doesn't look enteric. Ext: Moves all 4 extremities to command.  Bandaged.  Minimal edema. Psych: A&Ox3  (Dr. Darrick Meigs and I examined him together.)   Lab Results:  Results for orders placed or performed during the hospital encounter of 03/01/2017 (from the past 24 hour(s))  CBC with Differential/Platelet     Status: Abnormal   Collection Time: 02/22/17  2:38 AM  Result Value Ref Range   WBC 15.7 (H) 4.0 - 10.5 K/uL   RBC 5.21 4.22 - 5.81 MIL/uL   Hemoglobin 14.1 13.0 - 17.0 g/dL   HCT 44.9 39.0 - 52.0 %   MCV 86.2  78.0 - 100.0 fL   MCH 27.1 26.0 - 34.0 pg   MCHC 31.4 30.0 - 36.0 g/dL   RDW 19.3 (H) 11.5 - 15.5 %   Platelets 142 (L) 150 - 400 K/uL   Neutrophils Relative % 90 %   Neutro Abs 14.2 (H) 1.7 - 7.7 K/uL   Lymphocytes Relative 3 %   Lymphs Abs 0.4 (L) 0.7 - 4.0 K/uL   Monocytes Relative 5 %   Monocytes Absolute 0.8 0.1 - 1.0 K/uL   Eosinophils Relative 2 %   Eosinophils Absolute 0.3 0.0 - 0.7 K/uL   Basophils Relative 0 %   Basophils Absolute 0.0 0.0 - 0.1 K/uL  Comprehensive metabolic panel     Status: Abnormal   Collection Time: 02/22/17  2:38 AM  Result Value Ref Range   Sodium 151 (H) 135 - 145 mmol/L   Potassium 4.3 3.5 - 5.1 mmol/L   Chloride 115 (H) 101 - 111 mmol/L   CO2 24 22 - 32 mmol/L   Glucose, Bld 126 (H) 65 - 99 mg/dL   BUN 105 (H) 6 - 20 mg/dL   Creatinine, Ser 6.51 (H) 0.61 - 1.24 mg/dL   Calcium 8.4 (L) 8.9 - 10.3 mg/dL   Total Protein 5.1 (L) 6.5 - 8.1 g/dL   Albumin 1.9 (L) 3.5 - 5.0 g/dL   AST 17 15 - 41 U/L   ALT 13 (L) 17 - 63 U/L   Alkaline Phosphatase 60 38 -  126 U/L   Total Bilirubin 0.7 0.3 - 1.2 mg/dL   GFR calc non Af Amer 7 (L) >60 mL/min   GFR calc Af Amer 8 (L) >60 mL/min   Anion gap 12 5 - 15  Prealbumin     Status: Abnormal   Collection Time: 02/22/17  2:38 AM  Result Value Ref Range   Prealbumin 6.6 (L) 18 - 38 mg/dL  Magnesium     Status: Abnormal   Collection Time: 02/22/17  2:38 AM  Result Value Ref Range   Magnesium 2.6 (H) 1.7 - 2.4 mg/dL  Phosphorus     Status: Abnormal   Collection Time: 02/22/17  2:38 AM  Result Value Ref Range   Phosphorus 4.8 (H) 2.5 - 4.6 mg/dL  Triglycerides     Status: Abnormal   Collection Time: 02/22/17  2:38 AM  Result Value Ref Range   Triglycerides 256 (H) <150 mg/dL     Studies/Results: Dg Chest Port 1 View  Result Date: 02/21/2017 CLINICAL DATA:  Dyspnea, current smoker, history of CHF and ischemic cardiomyopathy, COPD EXAM: PORTABLE CHEST 1 VIEW COMPARISON:  Portable chest x-ray of February 19, 2017 FINDINGS: The lungs remain well-expanded. The confluent interstitial and alveolar opacities persist. There small bilateral pleural effusions greatest on the right. The heart is top-normal in size. The pulmonary vascularity is indistinct. The ICD is in stable position. There is calcification in the wall of the thoracic aorta. The right internal jugular venous catheter tip projects over the proximal SVC. IMPRESSION: Persistent patchy interstitial and alveolar opacities likely reflect pneumonia. There may be superimposed CHF though the pulmonary vascularity does not exhibit significant cephalization. Small bilateral pleural effusions. There has not been significant interval change since yesterday's study. Thoracic aortic atherosclerosis. Electronically Signed   By: David  Martinique M.D.   On: 02/21/2017 09:51   Dg Abd Portable 1v  Result Date: 02/21/2017 CLINICAL DATA:  81 year old male with abdominal distention. Leak from gastrojejunostomy. Subsequent encounter. EXAM: PORTABLE ABDOMEN - 1 VIEW COMPARISON:  02/09/2017 CT. FINDINGS: Gas-filled dilated colon with cecum measuring up to 9.8 cm. No gas distended small bowel loops. Catheter projects over the abdomen/pelvis. Cannot assess for free intraperitoneal air on supine view. AICD in place. Vascular calcifications. IMPRESSION: Gas-filled dilated colon with cecum measuring up to 9.8 cm. No gas distended small bowel loops. Cannot assess for free intraperitoneal air on supine view. Electronically Signed   By: Genia Del M.D.   On: 02/21/2017 09:42    . arformoterol  15 mcg Nebulization BID  . budesonide (PULMICORT) nebulizer solution  0.5 mg Nebulization BID  . heparin subcutaneous  5,000 Units Subcutaneous Q8H  . ipratropium-albuterol  3 mL Nebulization Q6H  . mouth rinse  15 mL Mouth Rinse BID  . metoprolol  2.5 mg Intravenous Q6H  . pantoprazole (PROTONIX) IV  40 mg Intravenous Q12H  . sodium chloride flush  10-40 mL Intracatheter Q12H  . sodium  chloride flush  3 mL Intravenous Q12H     Assessment/Plan: s/p Procedure(s): REPAIR OF PERFORATED ULCER  POD 10 graham patch perforation of marginal ulcer- 02/08/2017, Dr Brantley Stage - continue BID PPI  - continue to await further signs of bowel function - minimal flatus, one small BM ~4 d ago, distended abdomen - post-op ileus:NPO, IVF, started TNA 4/17 -CT abdomen with water-soluble GI contrast today.  This will rule out abscess as possible etiology of prolonged ileus and will assess for obstruction and/or leak at gastrojejunostomy - continue BID dressing changes  -  IS/pulm toilet   Respiratory failure - chest x-ray suggestive of PNA and possible CHF.  Dr. Darrick Meigs of [facility] to address this morning RLL PNA - on abx as below COPD Tobacco use CHF  AKI -Cr 6.6.  Question progressive volume depletion.  Question gently rehydrate.   Internal medicine to address VTE: SubQ heparin ID: Ceftriaxone  Code status: DNI   Plan: start TPN for prolonged post-operative ileus           CT abdomen with water-soluble GI contrast only- rule out abscess, leak, obstruction.   '@PROBHOSP'$ @  LOS: 10 days    Lucious Zou M 02/22/2017  . .prob

## 2017-02-22 NOTE — Progress Notes (Signed)
PHARMACY - ADULT TOTAL PARENTERAL NUTRITION CONSULT NOTE   Pharmacy Consult for TPN Indication: Prolonged ileus  Patient Measurements: Height: 6' (182.9 cm) Weight: 240 lb 11.9 oz (109.2 kg) (Patient was 110.2kg on admission to step down unit) IBW/kg (Calculated) : 77.6 TPN AdjBW (KG): 83.1 Body mass index is 32.65 kg/m.  Assessment:  Pt admitted with abdominal pain, found to have perf of gastrojejunostomy from previous GI operation. Pt had an ex lap with closure of ulcer from pre-existing GJostomy on 4/8. Pt now has post-op ileus, starting TPN.  GI: TPN to start due to post-op ileus from perforated ulcer, also on ppi bid Endo: cbgs < 130 Insulin requirements in the past 24 hours: None Renal + Lytes: AKI SCr 1.2 > 6.5, Na 154 > 151, CoCa 10, Ca x Phos product 48  on 1/2NS @ 50  still making urine, but low uop Pulm:HFNC Cards: soft BP HR 90s on amio gtt + lopressor  hx of AFib, anticoag on hold Hepatobil: LFTs/Tbili wnl Neuro: A&O, deconditioned  ID: completed course of vanc/zosyn > ceftriaxone for pneumonia Best Practices: heparin sq TPN Access: R IJ TPN start date: 4/17  Nutritional Goals: pending kCal: Protein:   Current Nutrition: NPO  Plan:  Increase Clinimix 5/15 to 60 ml/hr, no electrolytes 20% lipid emulsion at 20 ml/hr x 12 hr 1/2NS at 50 ml/hr -- consider changing due to hypernatremia This provides 48 g of protein and 1162 kCals per day  Add MVI to TPN Trace elements on Mon/Wed/Fri due to shortage    Hughes Better, PharmD, BCPS Clinical Pharmacist 02/22/2017 11:04 AM

## 2017-02-22 NOTE — Consult Note (Addendum)
[facility] Consult Note     Date: 02/22/2017                  Patient Name:  Jesus Sanders  MRN: [id number]  DOB: [birth year]  Age / Sex: 81 y.o., male         PCP: Leonard Downing, MD                 Service Requesting Consult: [facility]/ Dr. Darrick Meigs                 Reason for Consult: Acute kidney injury            Chief Complaint: abd pain  HPI: Pt is a 68M with PMH significant for chronic systolic CHF s/p ICD placement, COPD, peripheral arterial disease s/p stenting L lower extremity, h/o PE, h/o polycythemia vera who is now seen in consultation at the request of Dr. Darrick Meigs for evaluation and recommendations surrounding acute kidney injury.    Briefly, pt was admitted on 02/15/2017 for severe LUQ abd pain.  He was found to have a bowel perforation and went to the OR where an ulcer was identified and repaired.  He was also found to have R sided pneumonia.  Postoperatively, he remained intubated and was extubated 4/9 only to require re-intubation 4/10.  He was ultimately extubated again 4/12.  He has had a fib with RVR requiring amiodarone infusion.  He has had an uptrending leukocytosis and today is now 15.7.  Upon review of the record, pt is hypernatremic and his creatinine has been steadily uptrending from 1.2 on admission 4.8--> 6.51 today.  He is making marginal urine output.  He has been started on TPN.  Past Medical History:  Diagnosis Date  . AAA (abdominal aortic aneurysm) ([place])    a. CT 07/2015 - mild aneurysmal dilatation of the distal abdominal aorta measuring 3.2 x 3.4 cm.  Marland Kitchen CAD (coronary artery disease)    a. 07/2011 Anterior apical STEMI/Cath/PCI: LM nl, LAD 100p/m (2.5 x 8m Mini-Vision BMS), LCX 40p, RCA dominant, nl, EF 30%.  . Chronic bronchitis   . Chronic respiratory failure ([place]   . COPD (chronic obstructive pulmonary disease) ([place]   . Dilatation of thoracic aorta ([place]    a. CT 07/2015 - aneurysmal dilatation of the descending  thoracic aorta measuring 3.9 cm in greatest diameter.  . Duodenal perforation ([facility] June 2012  . GERD (gastroesophageal reflux disease)   . History of DVT (deep vein thrombosis)   . Hypertension   . Ischemic cardiomyopathy    a. 01/2012 S/P MDT Protecta single lead ICD, ser # [id number]  . Myocardial infarction ([place]   . Peritonitis ([facility] June 2012  . Pneumonia April 2012  . Polycythemia vera(238.4)    a. [facility] [place] [facility] [place] [facility] [place] [place]  will restart his phlebotomies from about Mar 15 2011  . Popliteal aneurysm ([place]   . Pulmonary embolism ([place]    a. Diagnosed 07/2015 - placed on Eliquis.  . Stomach ulcer   . Systolic CHF, chronic ([place]    a. 12/2011 Echo: EF 25-30%, mid-dist antsept/inf, apical AK, Gr 1 DD, Triv AI, Mild MR.  . Tobacco abuse    a. cigars    Past Surgical History:  Procedure Laterality Date  . CARDIAC CATHETERIZATION  Sept 2012   Normal left main, occluded LAD, 40% LCX and normal RCA. EF is 30%  . CARDIAC DEFIBRILLATOR PLACEMENT  single chamber  . CHOLECYSTECTOMY  03/2011   Dr. Marlou Starks  . COLON SURGERY    . IMPLANTABLE CARDIOVERTER DEFIBRILLATOR IMPLANT N/A 01/11/2012   Procedure: IMPLANTABLE CARDIOVERTER DEFIBRILLATOR IMPLANT;  Surgeon: Deboraha Sprang, MD;  Location: [facility] CATH LAB;  Service: Cardiovascular;  Laterality: N/A; [employer]  . PERIPHERAL VASCULAR CATHETERIZATION N/A 08/05/2015   Procedure: Lower Extremity Angiography;  Surgeon: Serafina Mitchell, MD;  Location: [place] CV LAB;  Service: Cardiovascular;  Laterality: N/A;  . REPAIR OF PERFORATED ULCER N/A 02/23/2017   Procedure: REPAIR OF PERFORATED ULCER;  Surgeon: Erroll Luna, MD;  Location: [place];  Service: General;  Laterality: N/A;  . SHOULDER ARTHROSCOPY     left, rotatotor cuff tendinopathy  . US ECHOCARDIOGRAPHY  Sept 2012   EF 25 to 30% with akinesis of the mid to distal anterior and apical myocardium, trivial AI and no apical thrombus    Family  History  Problem Relation Age of Onset  . Lung disease Father     [name]- worked at a [place] History:  reports that he quit smoking about 3 years ago. His smoking use included Cigars. He has a 30.00 pack-year smoking history. His smokeless tobacco use includes Chew. He reports that he drinks alcohol. He reports that he does not use drugs.  Allergies: No Known Allergies  Medications Prior to Admission  Medication Sig Dispense Refill  . aspirin EC 81 MG EC tablet Take 1 tablet (81 mg total) by mouth daily. 30 tablet 0  . BROVANA 15 MCG/2ML NEBU USE ONE VIAL IN NEBULIZER TWICE DAILY 120 mL 11  . budesonide (PULMICORT) 0.25 MG/2ML nebulizer solution USE ONE VIAL IN NEBULIZER TWICE DAILY 120 mL 11  . furosemide (LASIX) 40 MG tablet Take 20 mg by mouth daily.     Marland Kitchen lovastatin (MEVACOR) 20 MG tablet Take 20 mg by mouth at bedtime.    . metoprolol succinate (TOPROL-XL) 100 MG 24 hr tablet Take 1 tablet (100 mg total) by mouth 2 (two) times daily. 60 tablet 6  . ranitidine (ZANTAC) 150 MG tablet Take 150 mg by mouth daily.      Results for orders placed or performed during the hospital encounter of 02/22/2017 (from the past 48 hour(s))  Basic metabolic panel     Status: Abnormal   Collection Time: 02/20/17  4:00 PM  Result Value Ref Range   Sodium 153 (H) 135 - 145 mmol/L   Potassium 4.4 3.5 - 5.1 mmol/L   Chloride 119 (H) 101 - 111 mmol/L   CO2 19 (L) 22 - 32 mmol/L   Glucose, Bld 92 65 - 99 mg/dL   BUN 90 (H) 6 - 20 mg/dL   Creatinine, Ser 6.16 (H) 0.61 - 1.24 mg/dL   Calcium 8.4 (L) 8.9 - 10.3 mg/dL   GFR calc non Af Amer 8 (L) >60 mL/min   GFR calc Af Amer 9 (L) >60 mL/min    Comment: (NOTE) The eGFR has been calculated using the CKD EPI equation. This calculation has not been validated in all clinical situations. eGFR's persistently <60 mL/min signify possible Chronic Kidney Disease.    Anion gap 15 5 - 15  Basic metabolic panel     Status: Abnormal   Collection  Time: 02/21/17  4:33 AM  Result Value Ref Range   Sodium 154 (H) 135 - 145 mmol/L   Potassium 4.2 3.5 - 5.1 mmol/L   Chloride 115 (H) 101 - 111 mmol/L  CO2 21 (L) 22 - 32 mmol/L   Glucose, Bld 83 65 - 99 mg/dL   BUN 95 (H) 6 - 20 mg/dL   Creatinine, Ser 6.64 (H) 0.61 - 1.24 mg/dL   Calcium 8.5 (L) 8.9 - 10.3 mg/dL   GFR calc non Af Amer 7 (L) >60 mL/min   GFR calc Af Amer 8 (L) >60 mL/min    Comment: (NOTE) The eGFR has been calculated using the CKD EPI equation. This calculation has not been validated in all clinical situations. eGFR's persistently <60 mL/min signify possible Chronic Kidney Disease.    Anion gap 18 (H) 5 - 15  CBC with Differential/Platelet     Status: Abnormal   Collection Time: 02/21/17  4:33 AM  Result Value Ref Range   WBC 12.8 (H) 4.0 - 10.5 K/uL   RBC 5.13 4.22 - 5.81 MIL/uL   Hemoglobin 13.9 13.0 - 17.0 g/dL   HCT 43.6 39.0 - 52.0 %   MCV 85.0 78.0 - 100.0 fL   MCH 27.1 26.0 - 34.0 pg   MCHC 31.9 30.0 - 36.0 g/dL   RDW 18.8 (H) 11.5 - 15.5 %   Platelets 136 (L) 150 - 400 K/uL   Neutrophils Relative % 90 %   Neutro Abs 11.5 (H) 1.7 - 7.7 K/uL   Lymphocytes Relative 3 %   Lymphs Abs 0.4 (L) 0.7 - 4.0 K/uL   Monocytes Relative 5 %   Monocytes Absolute 0.7 0.1 - 1.0 K/uL   Eosinophils Relative 1 %   Eosinophils Absolute 0.2 0.0 - 0.7 K/uL   Basophils Relative 0 %   Basophils Absolute 0.0 0.0 - 0.1 K/uL  CBC with Differential/Platelet     Status: Abnormal   Collection Time: 02/22/17  2:38 AM  Result Value Ref Range   WBC 15.7 (H) 4.0 - 10.5 K/uL   RBC 5.21 4.22 - 5.81 MIL/uL   Hemoglobin 14.1 13.0 - 17.0 g/dL   HCT 44.9 39.0 - 52.0 %   MCV 86.2 78.0 - 100.0 fL   MCH 27.1 26.0 - 34.0 pg   MCHC 31.4 30.0 - 36.0 g/dL   RDW 19.3 (H) 11.5 - 15.5 %   Platelets 142 (L) 150 - 400 K/uL   Neutrophils Relative % 90 %   Neutro Abs 14.2 (H) 1.7 - 7.7 K/uL   Lymphocytes Relative 3 %   Lymphs Abs 0.4 (L) 0.7 - 4.0 K/uL   Monocytes Relative 5 %    Monocytes Absolute 0.8 0.1 - 1.0 K/uL   Eosinophils Relative 2 %   Eosinophils Absolute 0.3 0.0 - 0.7 K/uL   Basophils Relative 0 %   Basophils Absolute 0.0 0.0 - 0.1 K/uL  Comprehensive metabolic panel     Status: Abnormal   Collection Time: 02/22/17  2:38 AM  Result Value Ref Range   Sodium 151 (H) 135 - 145 mmol/L   Potassium 4.3 3.5 - 5.1 mmol/L   Chloride 115 (H) 101 - 111 mmol/L   CO2 24 22 - 32 mmol/L   Glucose, Bld 126 (H) 65 - 99 mg/dL   BUN 105 (H) 6 - 20 mg/dL   Creatinine, Ser 6.51 (H) 0.61 - 1.24 mg/dL   Calcium 8.4 (L) 8.9 - 10.3 mg/dL   Total Protein 5.1 (L) 6.5 - 8.1 g/dL   Albumin 1.9 (L) 3.5 - 5.0 g/dL   AST 17 15 - 41 U/L   ALT 13 (L) 17 - 63 U/L   Alkaline Phosphatase 60 38 -  126 U/L   Total Bilirubin 0.7 0.3 - 1.2 mg/dL   GFR calc non Af Amer 7 (L) >60 mL/min   GFR calc Af Amer 8 (L) >60 mL/min    Comment: (NOTE) The eGFR has been calculated using the CKD EPI equation. This calculation has not been validated in all clinical situations. eGFR's persistently <60 mL/min signify possible Chronic Kidney Disease.    Anion gap 12 5 - 15  Prealbumin     Status: Abnormal   Collection Time: 02/22/17  2:38 AM  Result Value Ref Range   Prealbumin 6.6 (L) 18 - 38 mg/dL  Magnesium     Status: Abnormal   Collection Time: 02/22/17  2:38 AM  Result Value Ref Range   Magnesium 2.6 (H) 1.7 - 2.4 mg/dL  Phosphorus     Status: Abnormal   Collection Time: 02/22/17  2:38 AM  Result Value Ref Range   Phosphorus 4.8 (H) 2.5 - 4.6 mg/dL  Triglycerides     Status: Abnormal   Collection Time: 02/22/17  2:38 AM  Result Value Ref Range   Triglycerides 256 (H) <150 mg/dL   Ct Abdomen Pelvis Wo Contrast  Result Date: 02/22/2017 CLINICAL DATA:  Leukocytosis. Prolonged colonic ileus. 9 days postop closure and Graham patch of perforated ulcer EXAM: CT ABDOMEN AND PELVIS WITHOUT CONTRAST TECHNIQUE: Multidetector CT imaging of the abdomen and pelvis was performed following the  standard protocol without IV contrast. COMPARISON:  02/07/2017 FINDINGS: Lower chest: Lung bases shows small bilateral pleural effusion. There is worsening consolidation with some air bronchogram. There is patchy airspace opacification in right lower lobe. Findings are highly suspicious for asymmetric pneumonia. Atelectasis noted in left lower lobe posteriorly. Hepatobiliary: Again noted status post cholecystectomy. Unenhanced liver shows no biliary ductal dilatation. Low-density lesion in the 4 B segment of the liver Pancreas: Again noted atrophic partially replaced pancreas. No focal pancreatic abnormality. Spleen: Unenhanced spleen shows no focal abnormality. Adrenals/Urinary Tract: Stable left adrenal mass measures 2.5 x 2.7 cm There is mild left hydronephrosis. Minimal dilatation of the left ureter without frank hydroureter. Minimal dilatation of the right ureter without frank hydroureter. No right hydronephrosis. No calcified ureteral calculi. There is moderate distended urinary bladder without filling defects. Stomach/Bowel: Stable postsurgical changes post gastro jejunostomy. The gastrojejunostomy is patent. Contrast material noted within small bowel. No evidence of small bowel obstruction. There is moderate distension of the right colon with gas up to 6.5 cm. Some contrast air-fluid levels are noted within right colon. Moderate gaseous distension of the transverse colon. Findings highly suspicious for proximal colonic ileus. Splenic flexure of the colon is collapsed. Proximal and distal left colon is collapsed. Minimal gas noted in proximal sigmoid colon. Sigmoid stir mid sigmoid colon is empty collapsed. Some colonic gas and stool noted in distal sigmoid colon/rectum. Vascular/Lymphatic: Stable infrarenal abdominal aortic aneurysm measures 3.3 cm. No retroperitoneal or mesenteric adenopathy. Extensive atherosclerotic calcifications of abdominal aorta. Reproductive: There is mild enlarged prostate gland  measures 7.4 by 5.9 cm with indentation of urinary bladder base. There is a Foley catheter with balloon inflated mid aspect of prostate gland. The Foley catheter should be advanced into the bladder. Other: There is anasarca infiltration of subcutaneous fat bilateral flank wall and pelvic wall. There is midline anterior abdominal scarring with incomplete closure of superficially open skin defect. Clinical correlation is necessary. Two postsurgical drains are noted in lower abdomen. No evidence of mesenteric fluid collection or mesenteric abscess. Musculoskeletal: No destructive bony lesions are noted. Sagittal images  of the spine shows diffuse osteopenia. Degenerative changes lower thoracic spine. Degenerative changes lump lower lumbar spine at L5-S1 level. IMPRESSION: 1. There is bilateral small pleural effusion. Stable calcified pleural plaques in right lower lobe posteriorly. There is consolidation with air bronchogram in right lower lobe posteriorly. Additional patchy airspace opacification and interstitial prominence noted in right lower lobe. Findings highly suspicious for asymmetric pneumonia. Clinical correlation is necessary. There is atelectasis left lower lobe posteriorly. 2. Mild left hydronephrosis. Minimal distension of the left ureter without frank hydroureter. Minimal distension of the right ureter without frank hydroureter. No calcified ureteral calculi are noted. 3. Moderate distended urinary bladder. No bladder filling defects. There is a Foley catheter with balloon inflated mid aspect of the prostate gland. The balloon should be deflated and the catheter should be advanced into urinary bladder. 4. Stable postsurgical changes post gastro jejunostomy. No evidence of stricture. No evidence of small bowel obstruction. Contrast material noted in right colon. There is moderate distension of the right colon with some air contrast levels. The largest measures 6.5 cm in diameter. Findings highly suspicious  for colonic ileus. Moderate gaseous distension of the transverse colon. The splenic flexure of the colon is decompressed collapsed. Partially collapsed descending colon. Majority of sigmoid colon empty collapsed. Minimal residual stool noted within rectum. 5. There is anasarca infiltration of subcutaneous fat abdominal and pelvic wall. 6. Postsurgical 2 drains are noted in lower abdomen. No mesenteric abscess or mesenteric fluid collection noted on this unenhanced scan. There is a midline abdominal wall scarring with incomplete closure superficially with open skin defect. Please see sagittal image 65. Clinical correlation is necessary. No subcutaneous abscess or fluid collection. Electronically Signed   By: Lahoma Crocker M.D.   On: 02/22/2017 12:25   Dg Chest Port 1 View  Result Date: 02/21/2017 CLINICAL DATA:  Dyspnea, current smoker, history of CHF and ischemic cardiomyopathy, COPD EXAM: PORTABLE CHEST 1 VIEW COMPARISON:  Portable chest x-ray of February 19, 2017 FINDINGS: The lungs remain well-expanded. The confluent interstitial and alveolar opacities persist. There small bilateral pleural effusions greatest on the right. The heart is top-normal in size. The pulmonary vascularity is indistinct. The ICD is in stable position. There is calcification in the wall of the thoracic aorta. The right internal jugular venous catheter tip projects over the proximal SVC. IMPRESSION: Persistent patchy interstitial and alveolar opacities likely reflect pneumonia. There may be superimposed CHF though the pulmonary vascularity does not exhibit significant cephalization. Small bilateral pleural effusions. There has not been significant interval change since yesterday's study. Thoracic aortic atherosclerosis. Electronically Signed   By: David  Martinique M.D.   On: 02/21/2017 09:51   Dg Abd Portable 1v  Result Date: 02/21/2017 CLINICAL DATA:  81 year old male with abdominal distention. Leak from gastrojejunostomy. Subsequent  encounter. EXAM: PORTABLE ABDOMEN - 1 VIEW COMPARISON:  02/17/2017 CT. FINDINGS: Gas-filled dilated colon with cecum measuring up to 9.8 cm. No gas distended small bowel loops. Catheter projects over the abdomen/pelvis. Cannot assess for free intraperitoneal air on supine view. AICD in place. Vascular calcifications. IMPRESSION: Gas-filled dilated colon with cecum measuring up to 9.8 cm. No gas distended small bowel loops. Cannot assess for free intraperitoneal air on supine view. Electronically Signed   By: Genia Del M.D.   On: 02/21/2017 09:42    ROS: unable to assess due to pt's mental status  Blood pressure 97/66, pulse 73, temperature 97.1 F (36.2 C), temperature source Axillary, resp. rate (!) 25, height 6' (1.829 m), weight 109.2  kg (240 lb 11.9 oz), SpO2 98 %. Physical Exam  GEN ill-appearing, frail man, sleeping HEENT EOMI, PERRL, dry MM NECK no JVD PULM tachypneic, R sided inspiratory and expiratory rhonchi, L side largely clear CV tachycardic ABD distended, tympanic to percussion, nontender, hypoactive bowel sounds, 2 JP drains in place, serous fluid EXT trace LE edema, L LE with cool mottled knee and cool purple bottom of the foot,  NEURO wakes up when stimulated and is able to converse with family  Assessment/Plan 1.  Acute non-oliguric kidney injury: most likely precipitated by a hypoperfusion injury in the setting of critical illness.  He is hypernatremic and doesn't appear to be intravascularly overloaded. Noted on CT is foley in the prostate so I have asked for this to be advanced to alleviate any potential ostruction (L hydro).  AKI is fairly severe and is reflective of overall multisystem organ dysfunction.  I had a long talk with pt's family- Blanch Media (SO), Suanne Marker (dtr), and 2 granddaughters who were at bedside today.  Discussed that he was not a dialysis candidate given severity of chronic illness and severity of acute illness on top of it.  They agreed.  Approach will be as  follows: attempt to correct hypernatremia, advance Foley, antibiotics as appropriate, and assess for improvement.  If no improvement in the next 24-48 hrs, GOC discussion.  Family in agreement.  Avoid nephrotoxic agents including NSAIDs, IV contrast, fleets enemas, mag citrate.  2.  Hypernatremia: free water deficit.  Expect to correct with TPN administration/ IVFs  3.  RLL consolidation: appears to be worsening as evidenced on CT; combined with increased leukocytosis, would consider re-broadening antibiotics (has already been treated with broad-spectrum abx for PNA on admission)  4.  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 ? Critical limb ischemia.  Per family, has appeared this way for several days.  Anticoag on hold due to recent abd surgery. Renal function prohibits angiogram and more than likely too ill for amputation if needed.  I discussed these concerns with family and they expressed understanding about this complicated situation.  May be helpful to at least discuss with vasc surgery although there may be nothing to offer.  Will get CK and lactic acid with next labs.  5.  Perforated ulcer s/p repair: now with postop ileus.  JP drains in.  He remains NPO and on TPN. If an enema is desired, would recommend soap suds rather than fleets.  6.  Afib: on amio gtt  Madelon Lips MD 02/22/2017, 3:34 PM

## 2017-02-22 NOTE — Progress Notes (Signed)
Initial Nutrition Assessment  DOCUMENTATION CODES:   Obesity unspecified  INTERVENTION:    TPN per pharmacy  NUTRITION DIAGNOSIS:   Inadequate oral intake related to altered GI function as evidenced by NPO status  GOAL:   Patient will meet greater than or equal to 90% of their needs  MONITOR:   Diet advancement, PO intake, Labs, Weight trends, I & O's, Other (Comment) (TPN prescription)  REASON FOR ASSESSMENT:   Consult New TPN/TNA  ASSESSMENT:   81 yo Male who presented to the hospital with the chief complain of abdominal pain. Acute and severe pain, patient requested to be brought to the hospital. Pain localized at the LUQ. On the initial physical examination patient was found hypotensive 92/68, and tachycardic 101 bpm. Abdomen with significant tenderness. CT of the abdomen with pneumoperitoneum, suggestive perforation from ulcer at the jejunum at the site of prior gastrojejunostomy.  Pt s/p procedure 4/8: EXPLORATORY LAPAROTOMY WITH  CLOSURE OF MARGINAL ULCER  Pt admitted with peritonitis with perforated viscus. NPO since admission.  Had NGT to LIS.  Pulled out 4/14. Labs reviewed.  Sodium 151 (H).  Magnesium 2.6 (H).  Phos 4.8 (H). TPN started given post-op ileus. On BiPAP as needed.  Patient is receiving Clinimix E 5/15 @ 60 ml/hr.  20% lipids at 20 ml/hr x 12 hours.  Provides 1162 kcal and 48 grams protein per day.  Meets 58% minimum estimated energy needs and 44% minimum estimated protein needs.  Nutrition focused physical exam completed.  No muscle or subcutaneous fat depletion noticed.  Diet Order:  Diet NPO time specified TPN (CLINIMIX-E) Adult TPN (CLINIMIX) Adult without lytes  Skin:  Wound (see comment) (stage I to buttocks; surgical incision to abd)  Last BM:  4/13  Height:   Ht Readings from Last 1 Encounters:  02/14/17 6' (1.829 m)   Weight:   Wt Readings from Last 1 Encounters:  02/21/17 240 lb 11.9 oz (109.2 kg)   Ideal Body Weight:   80.9 kg  BMI:  Body mass index is 32.65 kg/m.  Estimated Nutritional Needs:   Kcal:  2000-2200  Protein:  110-120 gm  Fluid:  2.0-2.2 L  EDUCATION NEEDS:   No education needs identified at this time  Arthur Holms, RD, LDN Pager #: [phone] After-Hours Pager #: [phone]

## 2017-02-23 LAB — CBC WITH DIFFERENTIAL/PLATELET
BASOS PCT: 0 %
Basophils Absolute: 0 10*3/uL (ref 0.0–0.1)
EOS ABS: 0.3 10*3/uL (ref 0.0–0.7)
Eosinophils Relative: 2 %
HCT: 43.3 % (ref 39.0–52.0)
Hemoglobin: 13.6 g/dL (ref 13.0–17.0)
Lymphocytes Relative: 4 %
Lymphs Abs: 0.5 10*3/uL — ABNORMAL LOW (ref 0.7–4.0)
MCH: 27 pg (ref 26.0–34.0)
MCHC: 31.4 g/dL (ref 30.0–36.0)
MCV: 85.9 fL (ref 78.0–100.0)
MONO ABS: 0.8 10*3/uL (ref 0.1–1.0)
MONOS PCT: 6 %
Neutro Abs: 11.8 10*3/uL — ABNORMAL HIGH (ref 1.7–7.7)
Neutrophils Relative %: 88 %
PLATELETS: 134 10*3/uL — AB (ref 150–400)
RBC: 5.04 MIL/uL (ref 4.22–5.81)
RDW: 19.4 % — AB (ref 11.5–15.5)
WBC: 13.3 10*3/uL — ABNORMAL HIGH (ref 4.0–10.5)

## 2017-02-23 LAB — COMPREHENSIVE METABOLIC PANEL
ALBUMIN: 1.7 g/dL — AB (ref 3.5–5.0)
ALT: 12 U/L — AB (ref 17–63)
AST: 16 U/L (ref 15–41)
Alkaline Phosphatase: 55 U/L (ref 38–126)
Anion gap: 11 (ref 5–15)
BILIRUBIN TOTAL: 0.6 mg/dL (ref 0.3–1.2)
BUN: 108 mg/dL — AB (ref 6–20)
CO2: 24 mmol/L (ref 22–32)
CREATININE: 6.21 mg/dL — AB (ref 0.61–1.24)
Calcium: 8.1 mg/dL — ABNORMAL LOW (ref 8.9–10.3)
Chloride: 114 mmol/L — ABNORMAL HIGH (ref 101–111)
GFR calc Af Amer: 9 mL/min — ABNORMAL LOW (ref >60)
GFR calc non Af Amer: 8 mL/min — ABNORMAL LOW (ref >60)
Glucose, Bld: 119 mg/dL — ABNORMAL HIGH (ref 65–99)
POTASSIUM: 4.2 mmol/L (ref 3.5–5.1)
Sodium: 149 mmol/L — ABNORMAL HIGH (ref 135–145)
TOTAL PROTEIN: 4.7 g/dL — AB (ref 6.5–8.1)

## 2017-02-23 LAB — CBC
HCT: 42 % (ref 39.0–52.0)
Hemoglobin: 13.2 g/dL (ref 13.0–17.0)
MCH: 26.8 pg (ref 26.0–34.0)
MCHC: 31.4 g/dL (ref 30.0–36.0)
MCV: 85.2 fL (ref 78.0–100.0)
Platelets: 126 10*3/uL — ABNORMAL LOW (ref 150–400)
RBC: 4.93 MIL/uL (ref 4.22–5.81)
RDW: 19.3 % — ABNORMAL HIGH (ref 11.5–15.5)
WBC: 11.9 10*3/uL — ABNORMAL HIGH (ref 4.0–10.5)

## 2017-02-23 LAB — LACTIC ACID, PLASMA: Lactic Acid, Venous: 1.1 mmol/L (ref 0.5–1.9)

## 2017-02-23 LAB — CK: Total CK: 131 U/L (ref 49–397)

## 2017-02-23 LAB — PHOSPHORUS: PHOSPHORUS: 4.3 mg/dL (ref 2.5–4.6)

## 2017-02-23 LAB — MAGNESIUM: Magnesium: 2.3 mg/dL (ref 1.7–2.4)

## 2017-02-23 MED ORDER — DIGOXIN 0.25 MG/ML IJ SOLN
0.2500 mg | Freq: Once | INTRAMUSCULAR | Status: AC
  Administered 2017-02-23: 0.25 mg via INTRAVENOUS
  Filled 2017-02-23: qty 2

## 2017-02-23 MED ORDER — SODIUM CHLORIDE 0.9 % IV BOLUS (SEPSIS)
500.0000 mL | Freq: Once | INTRAVENOUS | Status: AC
  Administered 2017-02-23: 500 mL via INTRAVENOUS

## 2017-02-23 MED ORDER — FLEET ENEMA 7-19 GM/118ML RE ENEM
1.0000 | ENEMA | Freq: Once | RECTAL | Status: AC
  Administered 2017-02-23: 1 via RECTAL
  Filled 2017-02-23: qty 1

## 2017-02-23 MED ORDER — FAT EMULSION 20 % IV EMUL
240.0000 mL | INTRAVENOUS | Status: DC
  Administered 2017-02-23: 240 mL via INTRAVENOUS
  Filled 2017-02-23: qty 250

## 2017-02-23 MED ORDER — SODIUM CHLORIDE 0.45 % IV SOLN: INTRAVENOUS | Status: DC

## 2017-02-23 MED ORDER — ALBUMIN HUMAN 5 % IV SOLN
12.5000 g | Freq: Once | INTRAVENOUS | Status: AC
  Administered 2017-02-23: 12.5 g via INTRAVENOUS
  Filled 2017-02-23: qty 250

## 2017-02-23 MED ORDER — M.V.I. ADULT IV INJ
INTRAVENOUS | Status: DC
  Administered 2017-02-23: 18:00:00 via INTRAVENOUS
  Filled 2017-02-23: qty 1992

## 2017-02-23 NOTE — Progress Notes (Signed)
PHARMACY - ADULT TOTAL PARENTERAL NUTRITION CONSULT NOTE   Pharmacy Consult:  TPN Indication: Prolonged ileus  Patient Measurements: Height: 6' (182.9 cm) Weight: 240 lb 11.9 oz (109.2 kg) (Patient was 110.2kg on admission to step down unit) IBW/kg (Calculated) : 77.6 TPN AdjBW (KG): 83.1 Body mass index is 32.65 kg/m.  Assessment:  13 YOM admitted with abdominal pain, found to have perforation of gastrojejunostomy from previous GI operation. He underwent ex-lap with closure of ulcer from pre-existing GJostomy on 02/10/2017.  Pharmacy consulted to manage TPN for post-op ileus.  GI: GERD on PPI IV - post-op ileus from perforated ulcer, O/P 138m.  Baseline prealbumin low at 6.6. Endo: no hx DM - CBGs controlled.  Not on SSI. Lytes: hypernatremia prior to TPN - Na/CL down 149/114, others WNL *days without lytes in TPN: 4/18 >> 4/19 Renal: AKI - SCr down 6.21, BUN up 108 - good UOP 0.8 ml/kg/hr, net +14L since admit (not intravascularly overloaded per Renal) Pulm: COPD / tobacco - HFNC with BiPAP, Brovana, Pulmicort Cards: CAD / HTN / ICM / Afib / CHF - BP soft, tachy - amio gtt, IV metoprolol, digoxin x1 4/19 AC: hx PE/DVT / Afib, AC held d/t recent surgery - H/H WNL, plts improved to 142 Hepatobil: LFTs / tbili WNL.  TG elevated at 256 Neuro: A&O, deconditioned  ID: s/p Vanc/Zosyn for PNA - afebrile, WBC up 15.7 Best Practices: heparin SQ TPN Access: right IJ TPN start date: 02/21/17  Nutritional Goals:  2000-2200 kCal and 110-120gm protein per day  Current Nutrition:  TPN   Plan:  - Increase Clinimix 5/15 (no lytes) to goal rate of 83 ml/hr and continue lipids 20 ml/hr over 12 hrs.  TPN will provide 1894 kCal and 100gm of protein per day, meeting 95% of minimal kCal and 91% of minimal protein need.  Will not advance TPN further in setting of Clinimix shortage and TPN meeting > 80% of patient's total needs. - Daily multivitamin in TPN - Trace elements in TPN every other day d/t  shortage, next due 4/21 - [facility].  D/C NS at 10 ml/hr. - Repeat TG 4/21, watch BUN and the need to reduce protein provision - F/U GoC, AM labs and glucose   Jesus Sanders [name] PharmD, BCPS Pager:  [phone]/19/2018, 8:31 AM

## 2017-02-23 NOTE — Progress Notes (Signed)
   Per girlfriend, Advanced Directive documentation being handled by family off-site.  If/when patient needs further assistance, page on-call chaplain or contact the spiritual care department.  Will follow, as needed.  - Rev. [place] MDiv ThM

## 2017-02-23 NOTE — Progress Notes (Signed)
Pt HR between 120-140. BP: 77/53, 122. EKG done showing A fib RVR. Lamar Blinks, NP notified findings. Orders given to give NS 554m bolus and Digoxin 0.'25mg'$  IV.  Will continue to monitor.

## 2017-02-23 NOTE — Progress Notes (Addendum)
[facility] Progress Note    Assessment/ Plan:   1.  Acute non-oliguric kidney injury: most likely precipitated by a hypoperfusion injury in the setting of critical illness.  plateauing, maybe will improve with the TPN and IVFs.  Not a dialysis candidate.  UOP increased after Foley advanced into bladder.  If no improvement in the next 24-48 hrs, GOC discussion.  Family in agreement.  Avoid nephrotoxic agents including NSAIDs, IV contrast, fleets enemas, mag citrate.  2.  Hypernatremia: free water deficit.  Improving. TPN administration/ IVFs  3.  RLL consolidation: appears to be worsening as evidenced on CT; combined with increased leukocytosis, would consider re-broadening antibiotics (has already been treated with broad-spectrum abx for PNA on admission).  CBC pending for today.  4.  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 ? Critical limb ischemia.  Per family, has appeared this way for several days.  Anticoag on hold due to recent abd surgery. Renal function prohibits angiogram and more than likely too ill for amputation if needed.  I discussed these concerns with family and they expressed understanding about this complicated situation.  May be helpful to at least discuss with vasc surgery although there may be nothing to offer.  Will get CK and lactic acid with next labs-- these are pending.  5.  Perforated ulcer s/p repair: now with postop ileus.  JP drains in.  He remains NPO and on TPN. If an enema is desired, would recommend soap suds rather than fleets.  Surgery following.  6.  Afib: on amio gtt  7.  ? Lung cancer:  Per CT scan 09/2016, he has a 5.2 cm spiculated mass in the LUL concerning for primary bronchogenic carcinoma.   Subjective:    Afib with RVR overnight and hypotension to 80s/ 60s   Objective:   BP 100/69 (BP Location: Left Wrist)   Pulse (!) 124   Temp 98 F  (36.7 C) (Oral)   Resp (!) 25   Ht 6' (1.829 m)   Wt 109.2 kg (240 lb 11.9 oz) Comment: Patient was 110.2kg on admission to step down unit  SpO2 96%   BMI 32.65 kg/m   Intake/Output Summary (Last 24 hours) at 02/23/17 0956 Last data filed at 02/23/17 0600  Gross per 24 hour  Intake          2459.67 ml  Output             2195 ml  Net           264.67 ml   Weight change:   Physical Exam: GEN ill-appearing, frail man, sleeping HEENT EOMI, PERRL, dry MM NECK no JVD PULM tachypneic, R sided inspiratory and expiratory rhonchi, L side some crackles CV tachycardic ABD distended, tympanic to percussion, nontender, hypoactive bowel sounds, 2 JP drains in place, serous fluid EXT trace LE edema, L LE with cool mottled knee and cool purple bottom of the foot, this is improved today NEURO wakes up when stimulated and is able to converse with family  Imaging: Ct Abdomen Pelvis Wo Contrast  Result Date: 02/22/2017 CLINICAL DATA:  Leukocytosis. Prolonged colonic ileus. 9 days postop closure and Graham patch of perforated ulcer EXAM: CT ABDOMEN AND PELVIS WITHOUT CONTRAST TECHNIQUE: Multidetector CT imaging of the abdomen and pelvis was performed following the standard protocol without IV contrast. COMPARISON:  02/26/2017 FINDINGS: Lower chest: Lung bases shows small bilateral pleural effusion. There is  worsening consolidation with some air bronchogram. There is patchy airspace opacification in right lower lobe. Findings are highly suspicious for asymmetric pneumonia. Atelectasis noted in left lower lobe posteriorly. Hepatobiliary: Again noted status post cholecystectomy. Unenhanced liver shows no biliary ductal dilatation. Low-density lesion in the 4 B segment of the liver Pancreas: Again noted atrophic partially replaced pancreas. No focal pancreatic abnormality. Spleen: Unenhanced spleen shows no focal abnormality. Adrenals/Urinary Tract: Stable left adrenal mass measures 2.5 x 2.7 cm There is mild  left hydronephrosis. Minimal dilatation of the left ureter without frank hydroureter. Minimal dilatation of the right ureter without frank hydroureter. No right hydronephrosis. No calcified ureteral calculi. There is moderate distended urinary bladder without filling defects. Stomach/Bowel: Stable postsurgical changes post gastro jejunostomy. The gastrojejunostomy is patent. Contrast material noted within small bowel. No evidence of small bowel obstruction. There is moderate distension of the right colon with gas up to 6.5 cm. Some contrast air-fluid levels are noted within right colon. Moderate gaseous distension of the transverse colon. Findings highly suspicious for proximal colonic ileus. Splenic flexure of the colon is collapsed. Proximal and distal left colon is collapsed. Minimal gas noted in proximal sigmoid colon. Sigmoid stir mid sigmoid colon is empty collapsed. Some colonic gas and stool noted in distal sigmoid colon/rectum. Vascular/Lymphatic: Stable infrarenal abdominal aortic aneurysm measures 3.3 cm. No retroperitoneal or mesenteric adenopathy. Extensive atherosclerotic calcifications of abdominal aorta. Reproductive: There is mild enlarged prostate gland measures 7.4 by 5.9 cm with indentation of urinary bladder base. There is a Foley catheter with balloon inflated mid aspect of prostate gland. The Foley catheter should be advanced into the bladder. Other: There is anasarca infiltration of subcutaneous fat bilateral flank wall and pelvic wall. There is midline anterior abdominal scarring with incomplete closure of superficially open skin defect. Clinical correlation is necessary. Two postsurgical drains are noted in lower abdomen. No evidence of mesenteric fluid collection or mesenteric abscess. Musculoskeletal: No destructive bony lesions are noted. Sagittal images of the spine shows diffuse osteopenia. Degenerative changes lower thoracic spine. Degenerative changes lump lower lumbar spine at L5-S1  level. IMPRESSION: 1. There is bilateral small pleural effusion. Stable calcified pleural plaques in right lower lobe posteriorly. There is consolidation with air bronchogram in right lower lobe posteriorly. Additional patchy airspace opacification and interstitial prominence noted in right lower lobe. Findings highly suspicious for asymmetric pneumonia. Clinical correlation is necessary. There is atelectasis left lower lobe posteriorly. 2. Mild left hydronephrosis. Minimal distension of the left ureter without frank hydroureter. Minimal distension of the right ureter without frank hydroureter. No calcified ureteral calculi are noted. 3. Moderate distended urinary bladder. No bladder filling defects. There is a Foley catheter with balloon inflated mid aspect of the prostate gland. The balloon should be deflated and the catheter should be advanced into urinary bladder. 4. Stable postsurgical changes post gastro jejunostomy. No evidence of stricture. No evidence of small bowel obstruction. Contrast material noted in right colon. There is moderate distension of the right colon with some air contrast levels. The largest measures 6.5 cm in diameter. Findings highly suspicious for colonic ileus. Moderate gaseous distension of the transverse colon. The splenic flexure of the colon is decompressed collapsed. Partially collapsed descending colon. Majority of sigmoid colon empty collapsed. Minimal residual stool noted within rectum. 5. There is anasarca infiltration of subcutaneous fat abdominal and pelvic wall. 6. Postsurgical 2 drains are noted in lower abdomen. No mesenteric abscess or mesenteric fluid collection noted on this unenhanced scan. There is a midline abdominal  wall scarring with incomplete closure superficially with open skin defect. Please see sagittal image 65. Clinical correlation is necessary. No subcutaneous abscess or fluid collection. Electronically Signed   By: Lahoma Crocker M.D.   On: 02/22/2017 12:25     Labs: BMET  Recent Labs Lab 02/17/17 0300 02/19/17 1348 02/20/17 1600 02/21/17 0433 02/22/17 0238 02/23/17 0424  NA 145 147* 153* 154* 151* 149*  K 4.0 5.5* 4.4 4.2 4.3 4.2  CL 110 112* 119* 115* 115* 114*  CO2 23 21* 19* 21* 24 24  GLUCOSE 75 88 92 83 126* 119*  BUN 51* 81* 90* 95* 105* 108*  CREATININE 4.28* 4.55* 6.16* 6.64* 6.51* 6.21*  CALCIUM 8.2* 8.1* 8.4* 8.5* 8.4* 8.1*  PHOS  --   --   --   --  4.8* 4.3   CBC  Recent Labs Lab 02/19/17 1348 02/20/17 0551 02/21/17 0433 02/22/17 0238  WBC 12.3* 13.3* 12.8* 15.7*  NEUTROABS 11.0* 12.1* 11.5* 14.2*  HGB 14.8 14.5 13.9 14.1  HCT 45.8 44.8 43.6 44.9  MCV 85.9 84.5 85.0 86.2  PLT 138* PLATELET CLUMPS NOTED ON SMEAR, COUNT APPEARS DECREASED 136* 142*    Medications:    . arformoterol  15 mcg Nebulization BID  . budesonide (PULMICORT) nebulizer solution  0.5 mg Nebulization BID  . heparin subcutaneous  5,000 Units Subcutaneous Q8H  . ipratropium-albuterol  3 mL Nebulization Q6H  . mouth rinse  15 mL Mouth Rinse BID  . metoprolol  2.5 mg Intravenous Q6H  . pantoprazole (PROTONIX) IV  40 mg Intravenous Q12H  . sodium chloride flush  10-40 mL Intracatheter Q12H  . sodium chloride flush  3 mL Intravenous Q12H      Madelon Lips, MD 02/23/2017, 9:56 AM

## 2017-02-23 NOTE — Progress Notes (Signed)
[facility]  PROGRESS NOTE  Jesus Sanders ZYS:[id number] DOB: Nov 07, 1936 DOA: 02/14/2017 PCP: Leonard Downing, MD   Brief HPI:    81 yo male who presented to the hospital with the chief complain of abdominal pain. Acute and severe pain, patient requested to be brought to the hospital. Pain localized at the LUQ. On the initial physical examination patient was found hypotensive 92/68, and tachycardic 101 bpm. Abdomen with significant tenderness. CT of the abdomen with pneumoperitoneum, suggestive perforation from ulcer at the jejunum at the site of prior gastrojejunostomy. Patient was intervened with laparotomy finding perforated marginal ulcer at pre-existing gastrojejunostomy. Patient developed post operative respiratory failure, remained on invasive mechanical ventilation. Extubated on 04/09 to be re-intubated on 04/10. Complicated course with hypotension requiring vasopressors. Positive right lower lobe pneumonia. Atrial fibrilaltion on amiodarone infusion. Worsenign renal failure with AKI. Extubated 04/12, transferred to step down unit on 04/14. Placed on bipap due to increased work of breathing. On fentanyl as needed. Noted worsening renal failure and started on IV fluids   Subjective   Patient seen and examined, Blood pressure 88/68 this morning, continues to have dyspnea. Heart rate and 120s. Was given 1 dose of digoxin 0.25 mg last night. Also per nursing staff patient had cold left lower extremity, which has now resolved.   Assessment/Plan:     1. Acute hypoxic respiratory failure-Patient continues to have dyspnea, currently on when necessary BiPAP. Patient is a DO NOT RESUSCITATE so no intubation. Continue bronchodilator therapy as needed. 2. Right lobar lobe  Pneumonia- patient has completed IV antibiotic regimen. Continue supportive care. 3. Atrial fibrillation- patient was started on amiodarone infusion for atrial fibrillation. Also has been having episodes of VT.  Anti-coagulation currently on hold due to recent surgery for perforated viscus. Continue scheduled metoprolol. Echo showed EF 40-45%.Will discontinue amiodarone at this time. 4. Hypotension- patient continues to have hypotension, will discontinue amiodarone. 5. Acute kidney injury- patient has had gradual worsening of creatinine, today creatinine is somewhat better 6.21. Nephrology was consulted and patient is deemed not a  dialysis candidate.  6. Perforated ulcer at the prior gastrojejunostomy- general surgery following.    DVT prophylaxis: Heparin  Code Status: DO NOT RESUSCITATE  Family Communication: Discussed with patient's daughters  regarding poor prognosis. They had agreed for palliative care consultation. Will consult  palliative care for goals of care.  Disposition Plan: Pending, will await  palliative care discussion.   Consultants:  General surgery  Procedures:  Exploratory laparotomy with closure of marginal ulcer, 4/08  Continuous infusions . sodium chloride 50 mL/hr at 02/23/17 0531  . sodium chloride    . amiodarone Stopped (02/23/17 1119)  . TPN (CLINIMIX) Adult without lytes     And  . fat emulsion    . TPN (CLINIMIX) Adult without lytes 60 mL/hr at 02/22/17 1748      Antibiotics:   Anti-infectives    Start     Dose/Rate Route Frequency Ordered Stop   02/17/17 1300  cefTRIAXone (ROCEPHIN) 2 g in dextrose 5 % 50 mL IVPB     2 g 100 mL/hr over 30 Minutes Intravenous Every 24 hours 02/17/17 1029 02/19/17 1348   02/16/17 2330  vancomycin (VANCOCIN) 1,250 mg in sodium chloride 0.9 % 250 mL IVPB     1,250 mg 166.7 mL/hr over 90 Minutes Intravenous  Once 02/16/17 1100 02/17/17 0039   02/16/17 1000  vancomycin (VANCOCIN) IVPB 1000 mg/200 mL premix  Status:  Discontinued     1,000 mg 200  mL/hr over 60 Minutes Intravenous Every 48 hours 02/14/17 1159 02/15/17 1018   02/15/17 1700  anidulafungin (ERAXIS) 100 mg in sodium chloride 0.9 % 100 mL IVPB  Status:   Discontinued     100 mg over 90 Minutes Intravenous Every 24 hours 02/14/17 0943 02/17/17 1025   02/14/17 1000  vancomycin (VANCOCIN) 1,750 mg in sodium chloride 0.9 % 500 mL IVPB     1,750 mg 250 mL/hr over 120 Minutes Intravenous  Once 02/14/17 0948 02/14/17 1229   02/14/17 0945  anidulafungin (ERAXIS) 200 mg in sodium chloride 0.9 % 200 mL IVPB     200 mg over 180 Minutes Intravenous  Once 02/14/17 0943 02/14/17 2025   02/13/17 1330  fluconazole (DIFLUCAN) IVPB 200 mg  Status:  Discontinued     200 mg 100 mL/hr over 60 Minutes Intravenous Every 24 hours 02/13/17 0900 02/14/17 0943   02/05/2017 1400  piperacillin-tazobactam (ZOSYN) IVPB 3.375 g  Status:  Discontinued     3.375 g 12.5 mL/hr over 240 Minutes Intravenous Every 8 hours 03/02/2017 1329 02/17/17 1025   02/10/2017 1400  vancomycin (VANCOCIN) IVPB 750 mg/150 ml premix  Status:  Discontinued     750 mg 150 mL/hr over 60 Minutes Intravenous Every 12 hours 02/09/2017 1329 02/13/17 0900   02/28/2017 1330  fluconazole (DIFLUCAN) IVPB 400 mg  Status:  Discontinued     400 mg 100 mL/hr over 120 Minutes Intravenous Every 24 hours 02/25/2017 1254 02/13/17 0900   02/25/2017 1230  fluconazole (DIFLUCAN) IVPB 100 mg  Status:  Discontinued     100 mg 50 mL/hr over 60 Minutes Intravenous Every 24 hours 02/05/2017 1228 03/01/2017 1253   02/14/2017 0130  vancomycin (VANCOCIN) IVPB 1000 mg/200 mL premix     1,000 mg 200 mL/hr over 60 Minutes Intravenous  Once 02/13/2017 0118 02/21/2017 0235   02/18/2017 0115  piperacillin-tazobactam (ZOSYN) IVPB 3.375 g     3.375 g 100 mL/hr over 30 Minutes Intravenous  Once 03/04/2017 0108 02/06/2017 0134       Objective   Vitals:   02/23/17 0755 02/23/17 0800 02/23/17 1100 02/23/17 1128  BP: 100/69 96/66 (!) 88/68 (!) 94/53  Pulse: (!) 124 71 (!) 122 (!) 51  Resp: (!) 25 (!) 28 (!) 27 (!) 27  Temp: 98 F (36.7 C)   97.2 F (36.2 C)  TempSrc: Oral   Axillary  SpO2:      Weight:      Height:        Intake/Output  Summary (Last 24 hours) at 02/23/17 1244 Last data filed at 02/23/17 1129  Gross per 24 hour  Intake          2186.07 ml  Output             2605 ml  Net          -418.93 ml   Filed Weights   02/17/2017 2001 02/15/17 0545 02/21/17 0757  Weight: 99.8 kg (220 lb) (!) 170.6 kg (376 lb) 109.2 kg (240 lb 11.9 oz)     Physical Examination:  Physical Exam: Eyes: No icterus, extraocular muscles intact  Mouth: Oral mucosa is dry, no lesions on palate,  Neck: Supple, no deformities, masses, or tenderness Lungs: Bilateral rhonchi.  Heart: Regular rate and rhythm, S1 and S2 normal, no murmurs, rubs auscultated Abdomen: BS normoactive,soft, distended,non-tender to palpation,no organomegaly Extremities: Both lower extremities warm to touch, peripheral pulses 2+ bilaterally Neuro : Alert and oriented to time, place and person, No  focal deficits Skin: No rashes seen on exam    Data Reviewed: I have personally reviewed following labs and imaging studies  CBG: No results for input(s): GLUCAP in the last 168 hours.  CBC:  Recent Labs Lab 02/19/17 1348 02/20/17 0551 02/21/17 0433 02/22/17 0238 02/23/17 1030  WBC 12.3* 13.3* 12.8* 15.7* 11.9*  NEUTROABS 11.0* 12.1* 11.5* 14.2*  --   HGB 14.8 14.5 13.9 14.1 13.2  HCT 45.8 44.8 43.6 44.9 42.0  MCV 85.9 84.5 85.0 86.2 85.2  PLT 138* PLATELET CLUMPS NOTED ON SMEAR, COUNT APPEARS DECREASED 136* 142* 126*    Basic Metabolic Panel:  Recent Labs Lab 02/19/17 1348 02/20/17 1600 02/21/17 0433 02/22/17 0238 02/23/17 0424  NA 147* 153* 154* 151* 149*  K 5.5* 4.4 4.2 4.3 4.2  CL 112* 119* 115* 115* 114*  CO2 21* 19* 21* 24 24  GLUCOSE 88 92 83 126* 119*  BUN 81* 90* 95* 105* 108*  CREATININE 4.55* 6.16* 6.64* 6.51* 6.21*  CALCIUM 8.1* 8.4* 8.5* 8.4* 8.1*  MG  --   --   --  2.6* 2.3  PHOS  --   --   --  4.8* 4.3    Recent Results (from the past 240 hour(s))  Culture, respiratory (NON-Expectorated)     Status: None   Collection  Time: 02/15/17  6:42 AM  Result Value Ref Range Status   Specimen Description TRACHEAL ASPIRATE  Final   Special Requests NONE  Final   Gram Stain   Final    MODERATE WBC PRESENT,BOTH PMN AND MONONUCLEAR NO ORGANISMS SEEN    Culture RARE CANDIDA ALBICANS  Final   Report Status 02/19/2017 FINAL  Final     Liver Function Tests:  Recent Labs Lab 02/22/17 0238 02/23/17 0424  AST 17 16  ALT 13* 12*  ALKPHOS 60 55  BILITOT 0.7 0.6  PROT 5.1* 4.7*  ALBUMIN 1.9* 1.7*    BNP (last 3 results)  Recent Labs  08/30/16 1055 02/27/2017 2256  BNP 78.5 107.5*    ProBNP (last 3 results) No results for input(s): PROBNP in the last 8760 hours.    Studies: Ct Abdomen Pelvis Wo Contrast  Result Date: 02/22/2017 CLINICAL DATA:  Leukocytosis. Prolonged colonic ileus. 9 days postop closure and Graham patch of perforated ulcer EXAM: CT ABDOMEN AND PELVIS WITHOUT CONTRAST TECHNIQUE: Multidetector CT imaging of the abdomen and pelvis was performed following the standard protocol without IV contrast. COMPARISON:  03/01/2017 FINDINGS: Lower chest: Lung bases shows small bilateral pleural effusion. There is worsening consolidation with some air bronchogram. There is patchy airspace opacification in right lower lobe. Findings are highly suspicious for asymmetric pneumonia. Atelectasis noted in left lower lobe posteriorly. Hepatobiliary: Again noted status post cholecystectomy. Unenhanced liver shows no biliary ductal dilatation. Low-density lesion in the 4 B segment of the liver Pancreas: Again noted atrophic partially replaced pancreas. No focal pancreatic abnormality. Spleen: Unenhanced spleen shows no focal abnormality. Adrenals/Urinary Tract: Stable left adrenal mass measures 2.5 x 2.7 cm There is mild left hydronephrosis. Minimal dilatation of the left ureter without frank hydroureter. Minimal dilatation of the right ureter without frank hydroureter. No right hydronephrosis. No calcified ureteral  calculi. There is moderate distended urinary bladder without filling defects. Stomach/Bowel: Stable postsurgical changes post gastro jejunostomy. The gastrojejunostomy is patent. Contrast material noted within small bowel. No evidence of small bowel obstruction. There is moderate distension of the right colon with gas up to 6.5 cm. Some contrast air-fluid levels are noted within right  colon. Moderate gaseous distension of the transverse colon. Findings highly suspicious for proximal colonic ileus. Splenic flexure of the colon is collapsed. Proximal and distal left colon is collapsed. Minimal gas noted in proximal sigmoid colon. Sigmoid stir mid sigmoid colon is empty collapsed. Some colonic gas and stool noted in distal sigmoid colon/rectum. Vascular/Lymphatic: Stable infrarenal abdominal aortic aneurysm measures 3.3 cm. No retroperitoneal or mesenteric adenopathy. Extensive atherosclerotic calcifications of abdominal aorta. Reproductive: There is mild enlarged prostate gland measures 7.4 by 5.9 cm with indentation of urinary bladder base. There is a Foley catheter with balloon inflated mid aspect of prostate gland. The Foley catheter should be advanced into the bladder. Other: There is anasarca infiltration of subcutaneous fat bilateral flank wall and pelvic wall. There is midline anterior abdominal scarring with incomplete closure of superficially open skin defect. Clinical correlation is necessary. Two postsurgical drains are noted in lower abdomen. No evidence of mesenteric fluid collection or mesenteric abscess. Musculoskeletal: No destructive bony lesions are noted. Sagittal images of the spine shows diffuse osteopenia. Degenerative changes lower thoracic spine. Degenerative changes lump lower lumbar spine at L5-S1 level. IMPRESSION: 1. There is bilateral small pleural effusion. Stable calcified pleural plaques in right lower lobe posteriorly. There is consolidation with air bronchogram in right lower lobe  posteriorly. Additional patchy airspace opacification and interstitial prominence noted in right lower lobe. Findings highly suspicious for asymmetric pneumonia. Clinical correlation is necessary. There is atelectasis left lower lobe posteriorly. 2. Mild left hydronephrosis. Minimal distension of the left ureter without frank hydroureter. Minimal distension of the right ureter without frank hydroureter. No calcified ureteral calculi are noted. 3. Moderate distended urinary bladder. No bladder filling defects. There is a Foley catheter with balloon inflated mid aspect of the prostate gland. The balloon should be deflated and the catheter should be advanced into urinary bladder. 4. Stable postsurgical changes post gastro jejunostomy. No evidence of stricture. No evidence of small bowel obstruction. Contrast material noted in right colon. There is moderate distension of the right colon with some air contrast levels. The largest measures 6.5 cm in diameter. Findings highly suspicious for colonic ileus. Moderate gaseous distension of the transverse colon. The splenic flexure of the colon is decompressed collapsed. Partially collapsed descending colon. Majority of sigmoid colon empty collapsed. Minimal residual stool noted within rectum. 5. There is anasarca infiltration of subcutaneous fat abdominal and pelvic wall. 6. Postsurgical 2 drains are noted in lower abdomen. No mesenteric abscess or mesenteric fluid collection noted on this unenhanced scan. There is a midline abdominal wall scarring with incomplete closure superficially with open skin defect. Please see sagittal image 65. Clinical correlation is necessary. No subcutaneous abscess or fluid collection. Electronically Signed   By: Lahoma Crocker M.D.   On: 02/22/2017 12:25    Scheduled Meds: . arformoterol  15 mcg Nebulization BID  . budesonide (PULMICORT) nebulizer solution  0.5 mg Nebulization BID  . heparin subcutaneous  5,000 Units Subcutaneous Q8H  .  ipratropium-albuterol  3 mL Nebulization Q6H  . mouth rinse  15 mL Mouth Rinse BID  . metoprolol  2.5 mg Intravenous Q6H  . pantoprazole (PROTONIX) IV  40 mg Intravenous Q12H  . sodium chloride flush  10-40 mL Intracatheter Q12H  . sodium chloride flush  3 mL Intravenous Q12H      Time spent: 25 min  [facility] Pager [phone]. If 7PM-7AM, please contact night-coverage at [email or web address], Office  [phone]  password [email or web address] 02/23/2017, 12:44 PM  LOS: 11 days

## 2017-02-23 NOTE — Care Management Important Message (Signed)
Important Message  Patient Details  Name: Jesus Sanders MRN: [id number] Date of Birth: 1936/02/19   Medicare Important Message Given:  Yes    Nathen May 02/23/2017, 12:26 PM

## 2017-02-23 NOTE — Progress Notes (Signed)
[facility] Progress Note  11 Days Post-Op  Subjective: CC: shortness of breath Reports his abdominal pain and fullness feels improved but denies flatus. Making urine. Requesting ice chips.   Objective: Vital signs in last 24 hours: Temp:  [97.1 F (36.2 C)-98 F (36.7 C)] 98 F (36.7 C) (04/19 0755) Pulse Rate:  [30-124] 124 (04/19 0755) Resp:  [20-29] 25 (04/19 0755) BP: (77-113)/(44-87) 100/69 (04/19 0755) SpO2:  [95 %-100 %] 96 % (04/19 0735) FiO2 (%):  [50 %] 50 % (04/19 0151) Last BM Date: 02/17/17  Intake/Output from previous day: 04/18 0701 - 04/19 0700 In: 3313.5 [P.O.:30; I.V.:3283.5] Out: 2195 [Urine:2050; Drains:145] Intake/Output this shift: No intake/output data recorded.  PE: Gen: Alert, NAD, pleasant and cooperative Card: irregularly irregular, 132 bpm during my exam  Pulm: mildy increased respiratory effort on [place], coarse upper airway sounds and expiratory rhonchi R>L present Abd: Soft, non-tender, moderate distention, hypoactive bowel sounds, no peritonitis, abdominal incision is c/d/I and granulating appropriately. JP: 145cc/24h serous  GU: UOP 2,050/24h Extremities: edema of bilateral lower extremities Psych: A&Ox3   Lab Results:   Recent Labs  02/21/17 0433 02/22/17 0238  WBC 12.8* 15.7*  HGB 13.9 14.1  HCT 43.6 44.9  PLT 136* 142*   BMET  Recent Labs  02/22/17 0238 02/23/17 0424  NA 151* 149*  K 4.3 4.2  CL 115* 114*  CO2 24 24  GLUCOSE 126* 119*  BUN 105* 108*  CREATININE 6.51* 6.21*  CALCIUM 8.4* 8.1*   PT/INR No results for input(s): LABPROT, INR in the last 72 hours. CMP     Component Value Date/Time   NA 149 (H) 02/23/2017 0424   NA 144 01/14/2015 1027   K 4.2 02/23/2017 0424   K 4.2 01/14/2015 1027   CL 114 (H) 02/23/2017 0424   CO2 24 02/23/2017 0424   CO2 28 01/14/2015 1027   GLUCOSE 119 (H) 02/23/2017 0424   GLUCOSE 131 01/14/2015 1027   BUN 108 (H) 02/23/2017 0424   BUN 19.2  01/14/2015 1027   CREATININE 6.21 (H) 02/23/2017 0424   CREATININE 1.3 01/14/2015 1027   CALCIUM 8.1 (L) 02/23/2017 0424   CALCIUM 10.0 01/14/2015 1027   PROT 4.7 (L) 02/23/2017 0424   PROT 7.1 01/14/2015 1027   ALBUMIN 1.7 (L) 02/23/2017 0424   ALBUMIN 3.9 01/14/2015 1027   AST 16 02/23/2017 0424   AST 17 01/14/2015 1027   ALT 12 (L) 02/23/2017 0424   ALT 12 01/14/2015 1027   ALKPHOS 55 02/23/2017 0424   ALKPHOS 96 01/14/2015 1027   BILITOT 0.6 02/23/2017 0424   BILITOT 0.97 01/14/2015 1027   GFRNONAA 8 (L) 02/23/2017 0424   GFRNONAA 43 (L) 10/24/2012 1327   GFRAA 9 (L) 02/23/2017 0424   GFRAA 49 (L) 10/24/2012 1327   Lipase     Component Value Date/Time   LIPASE 16 02/05/2017 2030       Studies/Results: Ct Abdomen Pelvis Wo Contrast  Result Date: 02/22/2017 CLINICAL DATA:  Leukocytosis. Prolonged colonic ileus. 9 days postop closure and Graham patch of perforated ulcer EXAM: CT ABDOMEN AND PELVIS WITHOUT CONTRAST TECHNIQUE: Multidetector CT imaging of the abdomen and pelvis was performed following the standard protocol without IV contrast. COMPARISON:  02/28/2017 FINDINGS: Lower chest: Lung bases shows small bilateral pleural effusion. There is worsening consolidation with some air bronchogram. There is patchy airspace opacification in right lower lobe. Findings are highly suspicious for asymmetric pneumonia. Atelectasis noted in left lower lobe posteriorly. Hepatobiliary: Again noted  status post cholecystectomy. Unenhanced liver shows no biliary ductal dilatation. Low-density lesion in the 4 B segment of the liver Pancreas: Again noted atrophic partially replaced pancreas. No focal pancreatic abnormality. Spleen: Unenhanced spleen shows no focal abnormality. Adrenals/Urinary Tract: Stable left adrenal mass measures 2.5 x 2.7 cm There is mild left hydronephrosis. Minimal dilatation of the left ureter without frank hydroureter. Minimal dilatation of the right ureter without frank  hydroureter. No right hydronephrosis. No calcified ureteral calculi. There is moderate distended urinary bladder without filling defects. Stomach/Bowel: Stable postsurgical changes post gastro jejunostomy. The gastrojejunostomy is patent. Contrast material noted within small bowel. No evidence of small bowel obstruction. There is moderate distension of the right colon with gas up to 6.5 cm. Some contrast air-fluid levels are noted within right colon. Moderate gaseous distension of the transverse colon. Findings highly suspicious for proximal colonic ileus. Splenic flexure of the colon is collapsed. Proximal and distal left colon is collapsed. Minimal gas noted in proximal sigmoid colon. Sigmoid stir mid sigmoid colon is empty collapsed. Some colonic gas and stool noted in distal sigmoid colon/rectum. Vascular/Lymphatic: Stable infrarenal abdominal aortic aneurysm measures 3.3 cm. No retroperitoneal or mesenteric adenopathy. Extensive atherosclerotic calcifications of abdominal aorta. Reproductive: There is mild enlarged prostate gland measures 7.4 by 5.9 cm with indentation of urinary bladder base. There is a Foley catheter with balloon inflated mid aspect of prostate gland. The Foley catheter should be advanced into the bladder. Other: There is anasarca infiltration of subcutaneous fat bilateral flank wall and pelvic wall. There is midline anterior abdominal scarring with incomplete closure of superficially open skin defect. Clinical correlation is necessary. Two postsurgical drains are noted in lower abdomen. No evidence of mesenteric fluid collection or mesenteric abscess. Musculoskeletal: No destructive bony lesions are noted. Sagittal images of the spine shows diffuse osteopenia. Degenerative changes lower thoracic spine. Degenerative changes lump lower lumbar spine at L5-S1 level. IMPRESSION: 1. There is bilateral small pleural effusion. Stable calcified pleural plaques in right lower lobe posteriorly. There  is consolidation with air bronchogram in right lower lobe posteriorly. Additional patchy airspace opacification and interstitial prominence noted in right lower lobe. Findings highly suspicious for asymmetric pneumonia. Clinical correlation is necessary. There is atelectasis left lower lobe posteriorly. 2. Mild left hydronephrosis. Minimal distension of the left ureter without frank hydroureter. Minimal distension of the right ureter without frank hydroureter. No calcified ureteral calculi are noted. 3. Moderate distended urinary bladder. No bladder filling defects. There is a Foley catheter with balloon inflated mid aspect of the prostate gland. The balloon should be deflated and the catheter should be advanced into urinary bladder. 4. Stable postsurgical changes post gastro jejunostomy. No evidence of stricture. No evidence of small bowel obstruction. Contrast material noted in right colon. There is moderate distension of the right colon with some air contrast levels. The largest measures 6.5 cm in diameter. Findings highly suspicious for colonic ileus. Moderate gaseous distension of the transverse colon. The splenic flexure of the colon is decompressed collapsed. Partially collapsed descending colon. Majority of sigmoid colon empty collapsed. Minimal residual stool noted within rectum. 5. There is anasarca infiltration of subcutaneous fat abdominal and pelvic wall. 6. Postsurgical 2 drains are noted in lower abdomen. No mesenteric abscess or mesenteric fluid collection noted on this unenhanced scan. There is a midline abdominal wall scarring with incomplete closure superficially with open skin defect. Please see sagittal image 65. Clinical correlation is necessary. No subcutaneous abscess or fluid collection. Electronically Signed   By: Julien Girt  Pop M.D.   On: 02/22/2017 12:25    Anti-infectives: Anti-infectives    Start     Dose/Rate Route Frequency Ordered Stop   02/17/17 1300  cefTRIAXone (ROCEPHIN) 2 g in  dextrose 5 % 50 mL IVPB     2 g 100 mL/hr over 30 Minutes Intravenous Every 24 hours 02/17/17 1029 02/19/17 1348   02/16/17 2330  vancomycin (VANCOCIN) 1,250 mg in sodium chloride 0.9 % 250 mL IVPB     1,250 mg 166.7 mL/hr over 90 Minutes Intravenous  Once 02/16/17 1100 02/17/17 0039   02/16/17 1000  vancomycin (VANCOCIN) IVPB 1000 mg/200 mL premix  Status:  Discontinued     1,000 mg 200 mL/hr over 60 Minutes Intravenous Every 48 hours 02/14/17 1159 02/15/17 1018   02/15/17 1700  anidulafungin (ERAXIS) 100 mg in sodium chloride 0.9 % 100 mL IVPB  Status:  Discontinued     100 mg over 90 Minutes Intravenous Every 24 hours 02/14/17 0943 02/17/17 1025   02/14/17 1000  vancomycin (VANCOCIN) 1,750 mg in sodium chloride 0.9 % 500 mL IVPB     1,750 mg 250 mL/hr over 120 Minutes Intravenous  Once 02/14/17 0948 02/14/17 1229   02/14/17 0945  anidulafungin (ERAXIS) 200 mg in sodium chloride 0.9 % 200 mL IVPB     200 mg over 180 Minutes Intravenous  Once 02/14/17 0943 02/14/17 2025   02/13/17 1330  fluconazole (DIFLUCAN) IVPB 200 mg  Status:  Discontinued     200 mg 100 mL/hr over 60 Minutes Intravenous Every 24 hours 02/13/17 0900 02/14/17 0943   02/28/2017 1400  piperacillin-tazobactam (ZOSYN) IVPB 3.375 g  Status:  Discontinued     3.375 g 12.5 mL/hr over 240 Minutes Intravenous Every 8 hours 02/07/2017 1329 02/17/17 1025   02/10/2017 1400  vancomycin (VANCOCIN) IVPB 750 mg/150 ml premix  Status:  Discontinued     750 mg 150 mL/hr over 60 Minutes Intravenous Every 12 hours 02/07/2017 1329 02/13/17 0900   03/05/2017 1330  fluconazole (DIFLUCAN) IVPB 400 mg  Status:  Discontinued     400 mg 100 mL/hr over 120 Minutes Intravenous Every 24 hours 02/14/2017 1254 02/13/17 0900   03/01/2017 1230  fluconazole (DIFLUCAN) IVPB 100 mg  Status:  Discontinued     100 mg 50 mL/hr over 60 Minutes Intravenous Every 24 hours 02/08/2017 1228 02/18/2017 1253   02/20/2017 0130  vancomycin (VANCOCIN) IVPB 1000 mg/200 mL premix      1,000 mg 200 mL/hr over 60 Minutes Intravenous  Once 02/07/2017 0118 02/10/2017 0235   02/25/2017 0115  piperacillin-tazobactam (ZOSYN) IVPB 3.375 g     3.375 g 100 mL/hr over 30 Minutes Intravenous  Once 02/16/2017 0108 03/02/2017 0134       Assessment/Plan REPAIR OF PERFORATED ULCER  POD 11 graham patch perforation of marginal ulcer- 02/10/2017, Dr Brantley Stage  - post-op ileus: NPO, IVF, started TNA 4/17 - increasing leukocytosis; CT abd/pelv 4/18 - no surgical leak or abscess; colonic distention consistent with ileus - continue to await further signs of bowel function - minimal flatus, one small BM 1 week ago, distended abdomen  - continue BID dressing changes - continue BID PPI   - IS/pulm toilet   Respiratory failure in the setting of COPD/RLL PNA - chest x-ray suggestive of PNA and possible CHF;CT findings highly suspicious for asymmetric pneumonia s/p complete coarse of abx treatment. Consider re-starting abx for PNA. Lung mass? - "5.2 cm posterior left upper lobe lungmass, which abuts and distorts the left major  fissure, highly suspicious for a primary bronchogenic carcinoma." CT  from 2017. CHF  A.fib - amiodarone gtt  AKI - Nephrology following; Creatinine worsening 6.21, not candidate for HD, advanced foley past prostate, correct hypernatremia, avoid nephrotoxic agents  Polycythemia ?  Tobacco use VTE: SubQ heparin ID: Ceftriaxone 4/13-4/15 FEN: NPO, TPN, hypernatremia (149), albumin 1.7, prealbumin 6.6 Code status: DNR   Plan: continue NPO/TNA - speech eval   Further treatment of persistent PNA  and workup of lung mass per primary team  Ok to resume anticoagulation from a surgical standpoint; no sign of anastomotic leak, post-op infection, or acute need for further surgical intervention. H&H stable.     LOS: 11 days    Jesus Sanders , [facility] 02/23/2017, 9:50 AM Pager: [phone] Consults: [phone] Mon-Fri 7:00 am-4:30 pm Sat-Sun 7:00  am-11:30 am

## 2017-02-23 NOTE — Evaluation (Signed)
Clinical/Bedside Swallow Evaluation Patient Details  Name: Jesus Sanders MRN: [id number] Date of Birth: [birth year]  Today's Date: 02/23/2017 Time: SLP Start Time (ACUTE ONLY): 89 SLP Stop Time (ACUTE ONLY): 1424 SLP Time Calculation (min) (ACUTE ONLY): 23 min  Past Medical History:  Past Medical History:  Diagnosis Date  . AAA (abdominal aortic aneurysm) ([place])    a. CT 07/2015 - mild aneurysmal dilatation of the distal abdominal aorta measuring 3.2 x 3.4 cm.  Marland Kitchen CAD (coronary artery disease)    a. 07/2011 Anterior apical STEMI/Cath/PCI: LM nl, LAD 100p/m (2.5 x 66m Mini-Vision BMS), LCX 40p, RCA dominant, nl, EF 30%.  . Chronic bronchitis   . Chronic respiratory failure ([place]   . COPD (chronic obstructive pulmonary disease) ([place]   . Dilatation of thoracic aorta ([place]    a. CT 07/2015 - aneurysmal dilatation of the descending thoracic aorta measuring 3.9 cm in greatest diameter.  . Duodenal perforation ([facility] June 2012  . GERD (gastroesophageal reflux disease)   . History of DVT (deep vein thrombosis)   . Hypertension   . Ischemic cardiomyopathy    a. 01/2012 S/P MDT Protecta single lead ICD, ser # [id number]  . Myocardial infarction ([place]   . Peritonitis ([place] [facility] [place] June 2012  . Pneumonia April 2012  . Polycythemia vera(238.4)    a. [facility] [place] [facility]  will restart his phlebotomies from about Mar 15 2011  . Popliteal aneurysm ([place]   . Pulmonary embolism ([place]    a. Diagnosed 07/2015 - placed on Eliquis.  . Stomach ulcer   . Systolic CHF, chronic ([place]    a. 12/2011 Echo: EF 25-30%, mid-dist antsept/inf, apical AK, Gr 1 DD, Triv AI, Mild MR.  . Tobacco abuse    a. cigars   Past Surgical History:  Past Surgical History:  Procedure Laterality Date  . CARDIAC CATHETERIZATION  Sept 2012   Normal left main, occluded LAD, 40% LCX and normal RCA. EF is 30%  . CARDIAC DEFIBRILLATOR PLACEMENT     single chamber  . CHOLECYSTECTOMY   03/2011   Dr. TMarlou Starks . COLON SURGERY    . IMPLANTABLE CARDIOVERTER DEFIBRILLATOR IMPLANT N/A 01/11/2012   Procedure: IMPLANTABLE CARDIOVERTER DEFIBRILLATOR IMPLANT;  Surgeon: SDeboraha Sprang MD;  [facility];  Service: Cardiovascular;  Laterality: N/A; [employer]  . PERIPHERAL VASCULAR CATHETERIZATION N/A 08/05/2015   Procedure: Lower Extremity Angiography;  Surgeon: VSerafina Mitchell MD;  [facility];  Service: Cardiovascular;  Laterality: N/A;  . REPAIR OF PERFORATED ULCER N/A 03/03/2017   Procedure: REPAIR OF PERFORATED ULCER;  Surgeon: TErroll Luna MD;  [place]  Service: General;  Laterality: N/A;  . SHOULDER ARTHROSCOPY     left, rotatotor cuff tendinopathy  . [place]  Sept 2012   EF 25 to 30% with akinesis of the mid to distal anterior and apical myocardium, trivial AI and no apical thrombus   HPI:  Pt is a 81yo male with PMH of CAD, COPD, chronic bronchitis, GERD, DVT, HTN, MI, PNA, PE, systolic CHF, and tobacco abuse who presented to the hospital with the chief complaint of abdominal pain. Patient was found hypotensive and tachycardic. CT of the abdomen with pneumoperitoneum, suggestive perforation from ulcer at the jejunum at the site of prior gastrojejunostomy. Patient developed post operative respiratory failure, remained on invasive mechanical ventilation. Extubated on 04/09 to be re-intubated on 04/10. Positive right lower lobe pneumonia, atrial fibrilaltion, worsening renal failure  with AKI. Extubated 04/12, transferred to step down unit on 04/14. Placed on bipap due to increased work of breathing.   Assessment / Plan / Recommendation Clinical Impression  [name] was alert with increased work of breathing during bedside swallow evaluation. Provided extensive oral care due to xerostomia, removed dried mucous/secretions from hard palate, and cleaned pt's dentures. Per family, pt has not had means of nutrition for roughly 1 week. Observed  wet vocal quality and chest rattling/congestion prior to and during eval. Trials of ice chips via teaspoon resulted in immediate and delayed productive and congested coughing. An instrumental swallow evaluation (likely FEES) would be warranted to assess swallowing function once respiratory status is improved. Educated pt, family, and RN re: recommendations. Recommend NPO and ice chips PRN after oral care (max every 2-3 hours). ST will continue to follow pt for dsyphagia intervention.   SLP Visit Diagnosis: Dysphagia, unspecified (R13.10)    Aspiration Risk  Severe aspiration risk;Risk for inadequate nutrition/hydration    Diet Recommendation NPO;Ice chips PRN after oral care   Medication Administration: Via alternative means Supervision: Full supervision/cueing for compensatory strategies Postural Changes: Seated upright at 90 degrees    Other  Recommendations Oral Care Recommendations: Oral care QID   Follow up Recommendations Skilled Nursing facility      Frequency and Duration min 2x/week  2 weeks       Prognosis Prognosis for Safe Diet Advancement: Fair Barriers to Reach Goals: Severity of deficits      Swallow Study   General HPI: Pt is a 81 yo male with PMH of CAD, COPD, chronic bronchitis, GERD, DVT, HTN, MI, PNA, PE, systolic CHF, and tobacco abuse who presented to the hospital with the chief complaint of abdominal pain. Patient was found hypotensive and tachycardic. CT of the abdomen with pneumoperitoneum, suggestive perforation from ulcer at the jejunum at the site of prior gastrojejunostomy. Patient developed post operative respiratory failure, remained on invasive mechanical ventilation. Extubated on 04/09 to be re-intubated on 04/10. Positive right lower lobe pneumonia, atrial fibrilaltion, worsening renal failure with AKI. Extubated 04/12, transferred to step down unit on 04/14. Placed on bipap due to increased work of breathing. Type of Study: Bedside Swallow  Evaluation Diet Prior to this Study: NPO Temperature Spikes Noted: No Respiratory Status: Nasal cannula (HFNC) History of Recent Intubation: Yes Length of Intubations (days): 3 days (intubated x 2) Date extubated: 02/16/17 Behavior/Cognition: Alert;Cooperative;Pleasant mood;Requires cueing Oral Cavity Assessment: Dried secretions Oral Care Completed by SLP: Yes Oral Cavity - Dentition: Dentures, top;Dentures, bottom Vision: Functional for self-feeding Self-Feeding Abilities: Needs set up;Needs assist Patient Positioning: Upright in bed Baseline Vocal Quality: Low vocal intensity;Wet;Hoarse Volitional Cough: Congested Volitional Swallow: Able to elicit    Oral/Motor/Sensory Function Overall Oral Motor/Sensory Function: Generalized oral weakness   Ice Chips Ice chips: Impaired Presentation: Spoon Oral Phase Impairments: Reduced labial seal Oral Phase Functional Implications: Right anterior spillage;Left anterior spillage Pharyngeal Phase Impairments: Cough - Immediate;Cough - Delayed;Suspected delayed Swallow   Thin Liquid Thin Liquid: Impaired Presentation: Spoon Oral Phase Impairments: Reduced labial seal Oral Phase Functional Implications: Right anterior spillage;Left anterior spillage Pharyngeal  Phase Impairments: Cough - Immediate    Nectar Thick Nectar Thick Liquid: Not tested   Honey Thick Honey Thick Liquid: Not tested   Puree Puree: Not tested   Solid   GO   Solid: Not tested        Fransisca Kaufmann , Student-SLP 02/23/2017,3:25 PM

## 2017-02-23 NOTE — Progress Notes (Signed)
Pt been hypotensive, SBP in 80s, HR in 120-130s throughout shift. MD made aware throughout shift. Will continue to monitor.

## 2017-02-24 ENCOUNTER — Inpatient Hospital Stay (HOSPITAL_COMMUNITY): Payer: Medicare Other

## 2017-02-24 DIAGNOSIS — K668 Other specified disorders of peritoneum: Secondary | ICD-10-CM

## 2017-02-24 DIAGNOSIS — Z515 Encounter for palliative care: Secondary | ICD-10-CM

## 2017-02-24 DIAGNOSIS — J9620 Acute and chronic respiratory failure, unspecified whether with hypoxia or hypercapnia: Secondary | ICD-10-CM

## 2017-02-24 LAB — BASIC METABOLIC PANEL
Anion gap: 8 (ref 5–15)
BUN: 104 mg/dL — AB (ref 6–20)
CALCIUM: 8.5 mg/dL — AB (ref 8.9–10.3)
CHLORIDE: 116 mmol/L — AB (ref 101–111)
CO2: 24 mmol/L (ref 22–32)
Creatinine, Ser: 5.33 mg/dL — ABNORMAL HIGH (ref 0.61–1.24)
GFR calc Af Amer: 11 mL/min — ABNORMAL LOW (ref >60)
GFR calc non Af Amer: 9 mL/min — ABNORMAL LOW (ref >60)
GLUCOSE: 108 mg/dL — AB (ref 65–99)
Potassium: 4.4 mmol/L (ref 3.5–5.1)
Sodium: 148 mmol/L — ABNORMAL HIGH (ref 135–145)

## 2017-02-24 MED ORDER — ACETAMINOPHEN 650 MG RE SUPP: 650.0000 mg | Freq: Four times a day (QID) | RECTAL | Status: DC | PRN

## 2017-02-24 MED ORDER — MORPHINE SULFATE (PF) 2 MG/ML IV SOLN: 2.0000 mg | INTRAVENOUS | Status: DC | PRN

## 2017-02-24 MED ORDER — SODIUM CHLORIDE 0.9 % IV SOLN
2.0000 mg/h | INTRAVENOUS | Status: DC
  Administered 2017-02-24: 2 mg/h via INTRAVENOUS
  Filled 2017-02-24: qty 10

## 2017-02-24 MED ORDER — FUROSEMIDE 10 MG/ML IJ SOLN
80.0000 mg | Freq: Once | INTRAMUSCULAR | Status: AC
  Administered 2017-02-24: 80 mg via INTRAVENOUS
  Filled 2017-02-24: qty 8

## 2017-02-24 MED ORDER — HALOPERIDOL LACTATE 5 MG/ML IJ SOLN: 0.5000 mg | INTRAMUSCULAR | Status: DC | PRN

## 2017-02-24 MED ORDER — ONDANSETRON 4 MG PO TBDP
4.0000 mg | ORAL_TABLET | Freq: Four times a day (QID) | ORAL | Status: DC | PRN
  Filled 2017-02-24: qty 1

## 2017-02-24 MED ORDER — ACETAMINOPHEN 325 MG PO TABS: 650.0000 mg | ORAL_TABLET | Freq: Four times a day (QID) | ORAL | Status: DC | PRN

## 2017-02-24 MED ORDER — HALOPERIDOL 0.5 MG PO TABS
0.5000 mg | ORAL_TABLET | ORAL | Status: DC | PRN
  Filled 2017-02-24: qty 1

## 2017-02-24 MED ORDER — GLYCOPYRROLATE 1 MG PO TABS
1.0000 mg | ORAL_TABLET | ORAL | Status: DC | PRN
  Filled 2017-02-24: qty 1

## 2017-02-24 MED ORDER — HALOPERIDOL LACTATE 2 MG/ML PO CONC
0.5000 mg | ORAL | Status: DC | PRN
  Filled 2017-02-24: qty 0.3

## 2017-02-24 MED ORDER — MORPHINE BOLUS VIA INFUSION
2.0000 mg | INTRAVENOUS | Status: DC | PRN
Start: 2017-02-24 — End: 2017-02-24
  Filled 2017-02-24: qty 2

## 2017-02-24 MED ORDER — GLYCOPYRROLATE 0.2 MG/ML IJ SOLN: 0.2000 mg | INTRAMUSCULAR | Status: DC | PRN

## 2017-02-24 MED ORDER — FAT EMULSION 20 % IV EMUL
240.0000 mL | INTRAVENOUS | Status: DC
  Filled 2017-02-24: qty 250

## 2017-02-24 MED ORDER — POLYVINYL ALCOHOL 1.4 % OP SOLN: 1.0000 [drp] | Freq: Four times a day (QID) | OPHTHALMIC | Status: DC | PRN

## 2017-02-24 MED ORDER — MIDAZOLAM HCL 2 MG/2ML IJ SOLN
2.0000 mg | Freq: Once | INTRAMUSCULAR | Status: AC
  Administered 2017-02-24: 2 mg via INTRAVENOUS
  Filled 2017-02-24: qty 2

## 2017-02-24 MED ORDER — ONDANSETRON HCL 4 MG/2ML IJ SOLN: 4.0000 mg | Freq: Four times a day (QID) | INTRAMUSCULAR | Status: DC | PRN

## 2017-02-24 MED ORDER — MORPHINE SULFATE (PF) 2 MG/ML IV SOLN
2.0000 mg | INTRAVENOUS | Status: AC
  Administered 2017-02-24: 2 mg via INTRAVENOUS
  Filled 2017-02-24: qty 1

## 2017-02-24 MED ORDER — M.V.I. ADULT IV INJ
INJECTION | INTRAVENOUS | Status: DC
  Filled 2017-02-24: qty 1992

## 2017-02-24 MED ORDER — BIOTENE DRY MOUTH MT LIQD: 15.0000 mL | OROMUCOSAL | Status: DC | PRN

## 2017-02-27 DIAGNOSIS — Z515 Encounter for palliative care: Secondary | ICD-10-CM

## 2017-03-06 ENCOUNTER — Ambulatory Visit: Payer: Medicare Other | Admitting: Cardiology

## 2017-03-07 NOTE — Progress Notes (Signed)
12 Days Post-Op  Subjective: CC on BiPAP  Objective: Vital signs in last 24 hours: Temp:  [97.2 F (36.2 C)-98 F (36.7 C)] 98 F (36.7 C) (04/20 0813) Pulse Rate:  [26-123] 114 (04/20 0830) Resp:  [22-31] 29 (04/20 0830) BP: (61-101)/(47-88) 92/63 (04/20 0813) SpO2:  [89 %-100 %] 100 % (04/20 0830) FiO2 (%):  [50 %-60 %] 60 % (04/20 0830) Last BM Date: 18-Mar-2017  Intake/Output from previous day: 04/19 0701 - 04/20 0700 In: 2573.1 [I.V.:2323.1; IV Piggyback:250] Out: 0938 [Urine:1025; Drains:240] Intake/Output this shift: Total I/O In: 10 [I.V.:10] Out: -   GI: Wound very clean, I removed his JP drain  Lab Results:   Recent Labs  02/23/17 1030 02/23/17 1435  WBC 11.9* 13.3*  HGB 13.2 13.6  HCT 42.0 43.3  PLT 126* 134*   BMET  Recent Labs  02/23/17 0424 2017/03/18 0500  NA 149* 148*  K 4.2 4.4  CL 114* 116*  CO2 24 24  GLUCOSE 119* 108*  BUN 108* 104*  CREATININE 6.21* 5.33*  CALCIUM 8.1* 8.5*   PT/INR No results for input(s): LABPROT, INR in the last 72 hours. ABG No results for input(s): PHART, HCO3 in the last 72 hours.  Invalid input(s): PCO2, PO2  Studies/Results: Ct Abdomen Pelvis Wo Contrast  Result Date: 02/22/2017 CLINICAL DATA:  Leukocytosis. Prolonged colonic ileus. 9 days postop closure and Graham patch of perforated ulcer EXAM: CT ABDOMEN AND PELVIS WITHOUT CONTRAST TECHNIQUE: Multidetector CT imaging of the abdomen and pelvis was performed following the standard protocol without IV contrast. COMPARISON:  02/05/2017 FINDINGS: Lower chest: Lung bases shows small bilateral pleural effusion. There is worsening consolidation with some air bronchogram. There is patchy airspace opacification in right lower lobe. Findings are highly suspicious for asymmetric pneumonia. Atelectasis noted in left lower lobe posteriorly. Hepatobiliary: Again noted status post cholecystectomy. Unenhanced liver shows no biliary ductal dilatation. Low-density lesion in the  4 B segment of the liver Pancreas: Again noted atrophic partially replaced pancreas. No focal pancreatic abnormality. Spleen: Unenhanced spleen shows no focal abnormality. Adrenals/Urinary Tract: Stable left adrenal mass measures 2.5 x 2.7 cm There is mild left hydronephrosis. Minimal dilatation of the left ureter without frank hydroureter. Minimal dilatation of the right ureter without frank hydroureter. No right hydronephrosis. No calcified ureteral calculi. There is moderate distended urinary bladder without filling defects. Stomach/Bowel: Stable postsurgical changes post gastro jejunostomy. The gastrojejunostomy is patent. Contrast material noted within small bowel. No evidence of small bowel obstruction. There is moderate distension of the right colon with gas up to 6.5 cm. Some contrast air-fluid levels are noted within right colon. Moderate gaseous distension of the transverse colon. Findings highly suspicious for proximal colonic ileus. Splenic flexure of the colon is collapsed. Proximal and distal left colon is collapsed. Minimal gas noted in proximal sigmoid colon. Sigmoid stir mid sigmoid colon is empty collapsed. Some colonic gas and stool noted in distal sigmoid colon/rectum. Vascular/Lymphatic: Stable infrarenal abdominal aortic aneurysm measures 3.3 cm. No retroperitoneal or mesenteric adenopathy. Extensive atherosclerotic calcifications of abdominal aorta. Reproductive: There is mild enlarged prostate gland measures 7.4 by 5.9 cm with indentation of urinary bladder base. There is a Foley catheter with balloon inflated mid aspect of prostate gland. The Foley catheter should be advanced into the bladder. Other: There is anasarca infiltration of subcutaneous fat bilateral flank wall and pelvic wall. There is midline anterior abdominal scarring with incomplete closure of superficially open skin defect. Clinical correlation is necessary. Two postsurgical drains are noted in  lower abdomen. No evidence of  mesenteric fluid collection or mesenteric abscess. Musculoskeletal: No destructive bony lesions are noted. Sagittal images of the spine shows diffuse osteopenia. Degenerative changes lower thoracic spine. Degenerative changes lump lower lumbar spine at L5-S1 level. IMPRESSION: 1. There is bilateral small pleural effusion. Stable calcified pleural plaques in right lower lobe posteriorly. There is consolidation with air bronchogram in right lower lobe posteriorly. Additional patchy airspace opacification and interstitial prominence noted in right lower lobe. Findings highly suspicious for asymmetric pneumonia. Clinical correlation is necessary. There is atelectasis left lower lobe posteriorly. 2. Mild left hydronephrosis. Minimal distension of the left ureter without frank hydroureter. Minimal distension of the right ureter without frank hydroureter. No calcified ureteral calculi are noted. 3. Moderate distended urinary bladder. No bladder filling defects. There is a Foley catheter with balloon inflated mid aspect of the prostate gland. The balloon should be deflated and the catheter should be advanced into urinary bladder. 4. Stable postsurgical changes post gastro jejunostomy. No evidence of stricture. No evidence of small bowel obstruction. Contrast material noted in right colon. There is moderate distension of the right colon with some air contrast levels. The largest measures 6.5 cm in diameter. Findings highly suspicious for colonic ileus. Moderate gaseous distension of the transverse colon. The splenic flexure of the colon is decompressed collapsed. Partially collapsed descending colon. Majority of sigmoid colon empty collapsed. Minimal residual stool noted within rectum. 5. There is anasarca infiltration of subcutaneous fat abdominal and pelvic wall. 6. Postsurgical 2 drains are noted in lower abdomen. No mesenteric abscess or mesenteric fluid collection noted on this unenhanced scan. There is a midline  abdominal wall scarring with incomplete closure superficially with open skin defect. Please see sagittal image 65. Clinical correlation is necessary. No subcutaneous abscess or fluid collection. Electronically Signed   By: Lahoma Crocker M.D.   On: 02/22/2017 12:25    Anti-infectives: Anti-infectives    Start     Dose/Rate Route Frequency Ordered Stop   02/17/17 1300  cefTRIAXone (ROCEPHIN) 2 g in dextrose 5 % 50 mL IVPB     2 g 100 mL/hr over 30 Minutes Intravenous Every 24 hours 02/17/17 1029 02/19/17 1348   02/16/17 2330  vancomycin (VANCOCIN) 1,250 mg in sodium chloride 0.9 % 250 mL IVPB     1,250 mg 166.7 mL/hr over 90 Minutes Intravenous  Once 02/16/17 1100 02/17/17 0039   02/16/17 1000  vancomycin (VANCOCIN) IVPB 1000 mg/200 mL premix  Status:  Discontinued     1,000 mg 200 mL/hr over 60 Minutes Intravenous Every 48 hours 02/14/17 1159 02/15/17 1018   02/15/17 1700  anidulafungin (ERAXIS) 100 mg in sodium chloride 0.9 % 100 mL IVPB  Status:  Discontinued     100 mg over 90 Minutes Intravenous Every 24 hours 02/14/17 0943 02/17/17 1025   02/14/17 1000  vancomycin (VANCOCIN) 1,750 mg in sodium chloride 0.9 % 500 mL IVPB     1,750 mg 250 mL/hr over 120 Minutes Intravenous  Once 02/14/17 0948 02/14/17 1229   02/14/17 0945  anidulafungin (ERAXIS) 200 mg in sodium chloride 0.9 % 200 mL IVPB     200 mg over 180 Minutes Intravenous  Once 02/14/17 0943 02/14/17 2025   02/13/17 1330  fluconazole (DIFLUCAN) IVPB 200 mg  Status:  Discontinued     200 mg 100 mL/hr over 60 Minutes Intravenous Every 24 hours 02/13/17 0900 02/14/17 0943   02/08/2017 1400  piperacillin-tazobactam (ZOSYN) IVPB 3.375 g  Status:  Discontinued  3.375 g 12.5 mL/hr over 240 Minutes Intravenous Every 8 hours 02/09/2017 1329 02/17/17 1025   02/07/2017 1400  vancomycin (VANCOCIN) IVPB 750 mg/150 ml premix  Status:  Discontinued     750 mg 150 mL/hr over 60 Minutes Intravenous Every 12 hours 02/28/2017 1329 02/13/17 0900    02/26/2017 1330  fluconazole (DIFLUCAN) IVPB 400 mg  Status:  Discontinued     400 mg 100 mL/hr over 120 Minutes Intravenous Every 24 hours 02/06/2017 1254 02/13/17 0900   02/15/2017 1230  fluconazole (DIFLUCAN) IVPB 100 mg  Status:  Discontinued     100 mg 50 mL/hr over 60 Minutes Intravenous Every 24 hours 02/25/2017 1228 02/10/2017 1253   02/26/2017 0130  vancomycin (VANCOCIN) IVPB 1000 mg/200 mL premix     1,000 mg 200 mL/hr over 60 Minutes Intravenous  Once 02/18/2017 0118 03/02/2017 0235   02/19/2017 0115  piperacillin-tazobactam (ZOSYN) IVPB 3.375 g     3.375 g 100 mL/hr over 30 Minutes Intravenous  Once 03/04/2017 0108 03/05/2017 0134      Assessment/Plan: s/p Procedure(s): REPAIR OF PERFORATED ULCER (N/A) S/Pgraham patch perforation of marginal ulcer- 02/13/2017, Dr Brantley Stage  - post-op ileus: NPO, IVF, started TNA 4/17 - increasing leukocytosis; CT abd/pelv 4/18 - no surgical leak or abscess; colonic distention consistent with ileus - SMOG enema  Respiratory failure in the setting of COPD/RLL PNA - per primary service Lung mass? - "5.2 cm posterior left upper lobe lungmass, which abuts and distorts the left major fissure, highly suspicious for a primary bronchogenic carcinoma." CT  from 2017. Per primary service. CHF  A.fib - amiodarone gtt  AKI - Nephrology following; Creatinine worsening 6.21, not candidate for HD, advanced foley past prostate, correct hypernatremia, avoid nephrotoxic agents  Polycythemia ?  Tobacco use VTE: SubQ heparin  FEN: NPO, TPN, hypernatremia (149), albumin 1.7, prealbumin 6.6 Code status: DNR   Plan: continue NPO/TNA - speech eval   Further treatment of persistent PNA  and workup of lung mass per primary team  Ok to resume anticoagulation from a surgical standpoint; no sign of anastomotic leak, post-op infection, or acute need for further surgical intervention. H&H stable.  LOS: 12 days    Camylle Whicker E 03-07-17

## 2017-03-07 NOTE — Progress Notes (Signed)
Called [employer] to have ICD turned off.

## 2017-03-07 NOTE — Progress Notes (Signed)
Morphine gtt started per Palliative order.

## 2017-03-07 NOTE — Progress Notes (Signed)
Family request for bipap to be taken off. Bipap removed after versed administered per order.

## 2017-03-07 NOTE — Progress Notes (Signed)
[facility]  PROGRESS NOTE  Jesus Sanders TIW:[id number] DOB: 1936-07-16 DOA: 02/27/2017 PCP: Leonard Downing, MD   Brief HPI:    81 yo male who presented to the hospital with the chief complain of abdominal pain. Acute and severe pain, patient requested to be brought to the hospital. Pain localized at the LUQ. On the initial physical examination patient was found hypotensive 92/68, and tachycardic 101 bpm. Abdomen with significant tenderness. CT of the abdomen with pneumoperitoneum, suggestive perforation from ulcer at the jejunum at the site of prior gastrojejunostomy. Patient was intervened with laparotomy finding perforated marginal ulcer at pre-existing gastrojejunostomy. Patient developed post operative respiratory failure, remained on invasive mechanical ventilation. Extubated on 04/09 to be re-intubated on 04/10. Complicated course with hypotension requiring vasopressors. Positive right lower lobe pneumonia. Atrial fibrilaltion on amiodarone infusion. Worsenign renal failure with AKI. Extubated 04/12, transferred to step down unit on 04/14. Placed on bipap due to increased work of breathing. On fentanyl as needed. Noted worsening renal failure and started on IV fluids   Subjective   Patient seen and examined, Lethargic, on BiPAP. Amiodarone was discontinued yesterday. Blood pressure has improved.   Assessment/Plan:     1. Acute hypoxic respiratory failure-Patient continues to have dyspnea, currently on when necessary BiPAP. Patient is a DO NOT RESUSCITATE so no intubation. Patient is lethargic this morning. 2. Right lobar lobe  Pneumonia- patient has completed IV antibiotic regimen. Continue supportive care. No new antibiotics will be started, palliative care meeting pending. 3. Hypernatremia- sodium improving, today 148. 4. Atrial fibrillation- patient was started on amiodarone infusion for atrial fibrillation. Also has been having episodes of VT. Anti-coagulation currently  on hold due to recent surgery for perforated viscus. Continue scheduled metoprolol. Echo showed EF 40-45%.amiodarone has been discontinued. Surprisingly heart rate has remained stable around 100's. 5. Hypotension-resolved, patient continues to have hypotension, amiodarone was discontinued yesterday. Blood pressure has improved 6. Acute kidney injury- patient has had gradual worsening of creatinine, today creatinine is somewhat better, 5.33. Nephrology was consulted and patient is deemed not a  dialysis candidate. Patient was seen by Dr. Hollie Salk this morning, even though creatinine is improving, he has poor prognosis 7. Perforated ulcer at the prior gastrojejunostomy- general surgery following. No new recommendations.    DVT prophylaxis: Heparin  Code Status: DO NOT RESUSCITATE  Family Communication: Discussed with patient's daughters  regarding poor prognosis. They had agreed for palliative care consultation. Palliative care meeting scheduled for today.  Disposition Plan: Pending, will await  palliative care discussion.   Consultants:  General surgery  Procedures:  Exploratory laparotomy with closure of marginal ulcer, 4/08  Continuous infusions . TPN (CLINIMIX) Adult without lytes     And  . fat emulsion    . TPN (CLINIMIX) Adult without lytes 83 mL/hr at 02/23/17 1745      Antibiotics:   Anti-infectives    Start     Dose/Rate Route Frequency Ordered Stop   02/17/17 1300  cefTRIAXone (ROCEPHIN) 2 g in dextrose 5 % 50 mL IVPB     2 g 100 mL/hr over 30 Minutes Intravenous Every 24 hours 02/17/17 1029 02/19/17 1348   02/16/17 2330  vancomycin (VANCOCIN) 1,250 mg in sodium chloride 0.9 % 250 mL IVPB     1,250 mg 166.7 mL/hr over 90 Minutes Intravenous  Once 02/16/17 1100 02/17/17 0039   02/16/17 1000  vancomycin (VANCOCIN) IVPB 1000 mg/200 mL premix  Status:  Discontinued     1,000 mg 200 mL/hr over 60 Minutes  Intravenous Every 48 hours 02/14/17 1159 02/15/17 1018   02/15/17  1700  anidulafungin (ERAXIS) 100 mg in sodium chloride 0.9 % 100 mL IVPB  Status:  Discontinued     100 mg over 90 Minutes Intravenous Every 24 hours 02/14/17 0943 02/17/17 1025   02/14/17 1000  vancomycin (VANCOCIN) 1,750 mg in sodium chloride 0.9 % 500 mL IVPB     1,750 mg 250 mL/hr over 120 Minutes Intravenous  Once 02/14/17 0948 02/14/17 1229   02/14/17 0945  anidulafungin (ERAXIS) 200 mg in sodium chloride 0.9 % 200 mL IVPB     200 mg over 180 Minutes Intravenous  Once 02/14/17 0943 02/14/17 2025   02/13/17 1330  fluconazole (DIFLUCAN) IVPB 200 mg  Status:  Discontinued     200 mg 100 mL/hr over 60 Minutes Intravenous Every 24 hours 02/13/17 0900 02/14/17 0943   02/06/2017 1400  piperacillin-tazobactam (ZOSYN) IVPB 3.375 g  Status:  Discontinued     3.375 g 12.5 mL/hr over 240 Minutes Intravenous Every 8 hours 02/10/2017 1329 02/17/17 1025   02/28/2017 1400  vancomycin (VANCOCIN) IVPB 750 mg/150 ml premix  Status:  Discontinued     750 mg 150 mL/hr over 60 Minutes Intravenous Every 12 hours 02/07/2017 1329 02/13/17 0900   02/27/2017 1330  fluconazole (DIFLUCAN) IVPB 400 mg  Status:  Discontinued     400 mg 100 mL/hr over 120 Minutes Intravenous Every 24 hours 02/21/2017 1254 02/13/17 0900   02/07/2017 1230  fluconazole (DIFLUCAN) IVPB 100 mg  Status:  Discontinued     100 mg 50 mL/hr over 60 Minutes Intravenous Every 24 hours 02/08/2017 1228 02/27/2017 1253   02/25/2017 0130  vancomycin (VANCOCIN) IVPB 1000 mg/200 mL premix     1,000 mg 200 mL/hr over 60 Minutes Intravenous  Once 03/06/2017 0118 02/05/2017 0235   03/04/2017 0115  piperacillin-tazobactam (ZOSYN) IVPB 3.375 g     3.375 g 100 mL/hr over 30 Minutes Intravenous  Once 03/05/2017 0108 03/02/2017 0134       Objective   Vitals:   03-20-17 0813 2017-03-20 0830 2017/03/20 1100 2017-03-20 1133  BP: 92/63     Pulse: (!) 107 (!) 114 66 63  Resp: (!) 27 (!) 29 (!) 31 (!) 33  Temp: 98 F (36.7 C)     TempSrc: Axillary     SpO2: 99% 100% 94% 94%   Weight:      Height:        Intake/Output Summary (Last 24 hours) at 20-Mar-2017 1245 Last data filed at Mar 20, 2017 1127  Gross per 24 hour  Intake             2954 ml  Output              905 ml  Net             2049 ml   Filed Weights   02/20/2017 2001 02/15/17 0545 02/21/17 0757  Weight: 99.8 kg (220 lb) (!) 170.6 kg (376 lb) 109.2 kg (240 lb 11.9 oz)     Physical Examination:  Physical Exam: Eyes: No icterus, extraocular muscles intact  Mouth: Oral mucosa is moist, no lesions on palate,  Neck: Supple, no deformities, masses, or tenderness Lungs: Bilateral rhonchi Heart: Regular rate and rhythm, S1 and S2 normal, no murmurs, rubs auscultated Abdomen: BS normoactive,soft,nondistended,non-tender to palpation,no organomegaly Extremities: Bilateral lower extremities warm to touch, peripheral pulses palpable. Bilateral 1+ pitting edema Neuro : Lethargic, on BiPAP    Data Reviewed: I have personally  reviewed following labs and imaging studies  CBG: No results for input(s): GLUCAP in the last 168 hours.  CBC:  Recent Labs Lab 02/19/17 1348 02/20/17 0551 02/21/17 0433 02/22/17 0238 02/23/17 1030 02/23/17 1435  WBC 12.3* 13.3* 12.8* 15.7* 11.9* 13.3*  NEUTROABS 11.0* 12.1* 11.5* 14.2*  --  11.8*  HGB 14.8 14.5 13.9 14.1 13.2 13.6  HCT 45.8 44.8 43.6 44.9 42.0 43.3  MCV 85.9 84.5 85.0 86.2 85.2 85.9  PLT 138* PLATELET CLUMPS NOTED ON SMEAR, COUNT APPEARS DECREASED 136* 142* 126* 134*    Basic Metabolic Panel:  Recent Labs Lab 02/20/17 1600 02/21/17 0433 02/22/17 0238 02/23/17 0424 02/25/17 0500  NA 153* 154* 151* 149* 148*  K 4.4 4.2 4.3 4.2 4.4  CL 119* 115* 115* 114* 116*  CO2 19* 21* '24 24 24  '$ GLUCOSE 92 83 126* 119* 108*  BUN 90* 95* 105* 108* 104*  CREATININE 6.16* 6.64* 6.51* 6.21* 5.33*  CALCIUM 8.4* 8.5* 8.4* 8.1* 8.5*  MG  --   --  2.6* 2.3  --   PHOS  --   --  4.8* 4.3  --     Recent Results (from the past 240 hour(s))  Culture, respiratory  (NON-Expectorated)     Status: None   Collection Time: 02/15/17  6:42 AM  Result Value Ref Range Status   Specimen Description TRACHEAL ASPIRATE  Final   Special Requests NONE  Final   Gram Stain   Final    MODERATE WBC PRESENT,BOTH PMN AND MONONUCLEAR NO ORGANISMS SEEN    Culture RARE CANDIDA ALBICANS  Final   Report Status 02/19/2017 FINAL  Final     Liver Function Tests:  Recent Labs Lab 02/22/17 0238 02/23/17 0424  AST 17 16  ALT 13* 12*  ALKPHOS 60 55  BILITOT 0.7 0.6  PROT 5.1* 4.7*  ALBUMIN 1.9* 1.7*    BNP (last 3 results)  Recent Labs  08/30/16 1055 03/01/2017 2256  BNP 78.5 107.5*      Studies: No results found.  Scheduled Meds: . arformoterol  15 mcg Nebulization BID  . budesonide (PULMICORT) nebulizer solution  0.5 mg Nebulization BID  . heparin subcutaneous  5,000 Units Subcutaneous Q8H  . ipratropium-albuterol  3 mL Nebulization Q6H  . mouth rinse  15 mL Mouth Rinse BID  . metoprolol  2.5 mg Intravenous Q6H  . pantoprazole (PROTONIX) IV  40 mg Intravenous Q12H  . sodium chloride flush  10-40 mL Intracatheter Q12H  . sodium chloride flush  3 mL Intravenous Q12H      Time spent: 25 min  [facility] Pager [phone]. If 7PM-7AM, please contact night-coverage at [email or web address], Office  [phone]  password [email or web address] 02/25/2017, 12:45 PM  LOS: 12 days

## 2017-03-07 NOTE — Progress Notes (Signed)
[facility] Progress Note    Assessment/ Plan:   1.  Acute non-oliguric kidney injury: most likely precipitated by a hypoperfusion injury in the setting of critical illness.  Creatinine improved but really AKI is reflective of MSOF and grim prognosis.  I have explained that I think he is dying to both Blanch Media (SO) and Suanne Marker (dtr).  Ordering IV Lasix 80 once for comfort with breathing.  Palliative care consulted.  He is not a dialysis candidate and his family has expressed that he wouldn't want dialysis anyway nor do they think it would benefit him. Avoid nephrotoxic agents including NSAIDs, IV contrast, fleets enemas, mag citrate.  2.  Hypernatremia: slightly improved  3.  L LE coolness and mottling- it's much improved today, CK and lactate are WNL.    5.  Perforated ulcer s/p repair: now with postop ileus.  JP drains in.  He remains NPO and on TPN. If an enema is desired, would recommend soap suds rather than fleets.  Surgery following.  6.  Afib: on amio gtt  7.  ? Lung cancer:  Per CT scan 09/2016, he has a 5.2 cm spiculated mass in the LUL concerning for primary bronchogenic carcinoma. Per Blanch Media, he and his family know about this and he had previously expressed the desire to them that he wouldn't want a biopsy, chemo, or radiation.  8.  Dispo: pall care consulted.  Greatly appreciate assistance.    Subjective:    In respiratory distress today, unresponsive.  Have spoken to SO, Blanch Media, and daughter Suanne Marker about pt's downturn and MSOF.  Suanne Marker was actually at pt's funeral home when I called.    They both expressed understanding about his grave prognosis.  Pall care consulted.  Still hypotensive   Objective:   BP 92/63 (BP Location: Left Wrist)   Pulse 63   Temp 98 F (36.7 C) (Axillary)   Resp (!) 33   Ht 6' (1.829 m)   Wt 109.2 kg (240 lb 11.9 oz) Comment: Patient was 110.2kg on admission to step down unit  SpO2 94%   BMI 32.65 kg/m   Intake/Output Summary  (Last 24 hours) at 03-16-17 1212 Last data filed at 03/16/17 1127  Gross per 24 hour  Intake             2954 ml  Output              905 ml  Net             2049 ml   Weight change:   Physical Exam: GEN ill-appearing, on BiPaP, resp distress HEENT eyes closed NECK + JVD PULM tachypneic, diffuse rhonchi CV tachycardic, irregular ABD distended, tympanic to percussion, nontender, hypoactive bowel sounds, 2 JP drains in place, serous fluid EXT 2+ LE edema NEURO not responsive today Imaging: No results found.  Labs: BMET  Recent Labs Lab 02/19/17 1348 02/20/17 1600 02/21/17 0433 02/22/17 0238 02/23/17 0424 03/16/2017 0500  NA 147* 153* 154* 151* 149* 148*  K 5.5* 4.4 4.2 4.3 4.2 4.4  CL 112* 119* 115* 115* 114* 116*  CO2 21* 19* 21* '24 24 24  '$ GLUCOSE 88 92 83 126* 119* 108*  BUN 81* 90* 95* 105* 108* 104*  CREATININE 4.55* 6.16* 6.64* 6.51* 6.21* 5.33*  CALCIUM 8.1* 8.4* 8.5* 8.4* 8.1* 8.5*  PHOS  --   --   --  4.8* 4.3  --    CBC  Recent Labs Lab 02/20/17 0551 02/21/17 0433 02/22/17 0238 02/23/17  1030 02/23/17 1435  WBC 13.3* 12.8* 15.7* 11.9* 13.3*  NEUTROABS 12.1* 11.5* 14.2*  --  11.8*  HGB 14.5 13.9 14.1 13.2 13.6  HCT 44.8 43.6 44.9 42.0 43.3  MCV 84.5 85.0 86.2 85.2 85.9  PLT PLATELET CLUMPS NOTED ON SMEAR, COUNT APPEARS DECREASED 136* 142* 126* 134*    Medications:    . arformoterol  15 mcg Nebulization BID  . budesonide (PULMICORT) nebulizer solution  0.5 mg Nebulization BID  . heparin subcutaneous  5,000 Units Subcutaneous Q8H  . ipratropium-albuterol  3 mL Nebulization Q6H  . mouth rinse  15 mL Mouth Rinse BID  . metoprolol  2.5 mg Intravenous Q6H  . pantoprazole (PROTONIX) IV  40 mg Intravenous Q12H  . sodium chloride flush  10-40 mL Intracatheter Q12H  . sodium chloride flush  3 mL Intravenous Q12H      Madelon Lips, MD 03/19/17, 12:12 PM

## 2017-03-07 NOTE — Progress Notes (Signed)
Central tele called to notified this nurse that patient was asystole. This nurse and Ana A at patient bedside to pronounce. [employer] arrived to make sure ICD was off. This nurse/Ana had placed magnet on chest of ICD. Family at bedside.

## 2017-03-07 NOTE — Progress Notes (Signed)
   Followed-up w/ family.  Please page on-call chaplain or contact the spiritual care department, as needed.

## 2017-03-07 NOTE — Progress Notes (Signed)
PHARMACY - ADULT TOTAL PARENTERAL NUTRITION CONSULT NOTE   Pharmacy Consult:  TPN Indication: Prolonged ileus  Patient Measurements: Height: 6' (182.9 cm) Weight: 240 lb 11.9 oz (109.2 kg) (Patient was 110.2kg on admission to step down unit) IBW/kg (Calculated) : 77.6 TPN AdjBW (KG): 83.1 Body mass index is 32.65 kg/m.  Assessment:  65 YOM admitted with abdominal pain, found to have perforation of gastrojejunostomy from previous GI operation. He underwent ex-lap with closure of ulcer from pre-existing GJostomy on 03/02/2017.  Pharmacy consulted to manage TPN for post-op ileus.  GI: GERD on PPI IV - post-op ileus from perforated ulcer, O/P 210m.  Baseline prealbumin low at 6.6.  Speech rec NPO status. Endo: no hx DM - AM glucose WNL.  Not on SSI. Lytes: hypernatremia prior to TPN - Na/CL 148/116, others WNL *days without lytes in TPN: 4/18 >> 4/20 Renal: AKI - SCr down 5.33, BUN down 104 - UOP 0.4 ml/kg/hr, [facility] +15.3L since admit (not intravascularly overloaded per Renal).  Not a dialysis candidate. Pulm: COPD / tobacco, ?lung cancer - HFNC with BiPAP, Brovana, Pulmicort Cards: CAD / HTN / ICM / Afib / CHF - BP low-low normal, tachy - off amio gtt, IV metoprolol, digoxin x1 4/19 AC: hx PE/DVT / Afib, AC held d/t recent surgery - H/H WNL, plts 134.  ?LLE ischemia Hepatobil: LFTs / tbili WNL.  TG elevated at 256 Neuro: A&O, deconditioned  ID: s/p Vanc/Zosyn for PNA - afebrile, WBC 13.3 Best Practices: heparin SQ TPN Access: right IJ placed 03/04/2017 TPN start date: 02/21/17  Nutritional Goals:  2000-2200 kCal and 110-120gm protein per day  Current Nutrition:  TPN   Plan:  - Continue Clinimix 5/15 (no lytes) at 83 ml/hr and lipids at 20 ml/hr over 12 hrs.  TPN provides 1894 kCal and 100gm of protein per day, meeting 95% of minimal kCal and 91% of minimal protein need.  Will not advance TPN further in setting of Clinimix shortage and TPN meeting > 80% of patient's total  needs. - Daily multivitamin in TPN - Trace elements in TPN every other day d/t shortage, next due 4/20 - Repeat TG 4/21, watch BUN and the need to reduce protein provision - F/U GoC, AM glucose   Jonerik Sliker [name] PharmD, BCPS Pager:  [phone] 27, 2018 8:27 AM

## 2017-03-07 NOTE — Progress Notes (Signed)
Patient on comfort care on morphine gtt, family at bedside.versed IV was given prior to discontinuing bipap pt started desating to low 70's magnet was applied on top of the ICD,HR started dropping, asystole per CCMD chaplain at bedside. Two nurses pronounced at 79 MD was informed, comforting family at bedside.

## 2017-03-07 NOTE — Consult Note (Signed)
Consultation Note Date: Mar 25, 2017   Patient Name: Jesus Sanders  DOB: 07-04-1936  MRN: [id number]  Age / Sex: 81 y.o., male  PCP: Leonard Downing, MD Referring Physician: Oswald Hillock, MD  Reason for Consultation: Establishing goals of care, Non pain symptom management and Terminal Care  HPI/Patient Profile: 81 y.o. male  with past medical history of chronic systolic CHF (s/p ICD), COPD, PAD (s/p LLE stent), PE, polycythemia vera admitted on 02/19/2017 with perforation of ulcer at the jejunum at site of prior gastrojejunostomy- admitted for surgical repair. Had complicated recovery with respiratory failure requiring prolonged intubation and ventilation, was extubated on 4/9 and reintubated on 4/10- course further complicated by hypotension, pneumonia, afib (req amiodorone infusion), and AKI.  He was extubated again on 4/12, and has again developed respiratory distress, anasarca and has been on Bipap since last night. Nephrology consulted - not a candidate for dialysis and given patient's poor prognosis, palliative medicine consulted for [place].    Clinical Assessment and Goals of Care: Evaluated patient at bedside. Dyspneic on bipap, lethargic, does not answer questions. Diffuse edema.  Met with patient's two daughter's Tammy and [place]. Introduced palliative medicine and and answered their questions re: comfort measures and what the procedure would be to discontinue Bipap and other life prolonging measures. They note patient has had decreased level of functioning and has been struggling to breath at home for the past year. They understand that patient is at end of life and they feel patient is suffering. Patient would not want to be reintubated and he is uncomfortable on BiPap. Their priority is for him to be comfortable. Plan made to transition patient to comfort measures only. Discussed prognosis with family- Based  on patient's respiratory status, once transition to comfort measures is made and Bipap is stopped, if patient continues to breath without the bipap, he will lifetime will likely be limited to hours.  Primary Decision Maker NEXT OF KIN- patient's daughters    SUMMARY OF RECOMMENDATIONS -DNR -D/C ICD -Start continuous morphine infusion with prn boluses -Bolus versed just before discontinuing Bipap -Other comfort medications as ordered    Code Status/Advance Care Planning:  DNR    Palliative Prophylaxis:   Frequent Pain Assessment  Additional Recommendations (Limitations, Scope, Preferences):  Full Comfort Care  Prognosis:    Hours - Days  Discharge Planning: Anticipated Hospital Death  Primary Diagnoses: Present on Admission: . Bowel perforation ([place]) . Chronic systolic heart failure ([place]) . CAD (coronary artery disease) . Essential hypertension . CAP (community acquired pneumonia) . Lactic acidosis . Ischemic cardiomyopathy . Automatic implantable cardioverter-defibrillator- medtronic   I have reviewed the medical record, interviewed the patient and family, and examined the patient. The following aspects are pertinent.  Past Medical History:  Diagnosis Date  . AAA (abdominal aortic aneurysm) ([place])    a. CT 07/2015 - mild aneurysmal dilatation of the distal abdominal aorta measuring 3.2 x 3.4 cm.  Marland Kitchen CAD (coronary artery disease)    a. 07/2011 Anterior apical STEMI/Cath/PCI: LM nl, LAD  100p/m (2.5 x 55m Mini-Vision BMS), LCX 40p, RCA dominant, nl, EF 30%.  . Chronic bronchitis   . Chronic respiratory failure ([place]   . COPD (chronic obstructive pulmonary disease) ([place]   . Dilatation of thoracic aorta ([facility]    a. CT 07/2015 - aneurysmal dilatation of the descending thoracic aorta measuring 3.9 cm in greatest diameter.  . Duodenal perforation ([facility] June 2012  . GERD (gastroesophageal reflux disease)   . History of DVT (deep vein thrombosis)   . Hypertension   .  Ischemic cardiomyopathy    a. 01/2012 S/P MDT Protecta single lead ICD, ser # [id number]  . Myocardial infarction ([place]   . Peritonitis ([facility] June 2012  . Pneumonia April 2012  . Polycythemia vera(238.4)    a. Used to [facility]bot[facility]  will restart his phlebotomies from about Mar 15 2011  . Popliteal aneurysm ([place]   . Pulmonary embolism ([place]    a. Diagnosed 07/2015 - placed on Eliquis.  . Stomach ulcer   . Systolic CHF, chronic ([place]    a. 2March 25, 2013Echo: EF 25-30%, mid-dist antsept/inf, apical AK, Gr 1 DD, Triv AI, Mild MR.  . Tobacco abuse    a. cigars   Social History   Social History  . Marital status: Widowed    Spouse name: N/A  . Number of children: N/A  . Years of education: N/A   Social History Main Topics  . Smoking status: Former Smoker    Packs/day: 0.50    Years: 60.00    Types: Cigars    Quit date: 05/18/2013  . Smokeless tobacco: Current User    Types: Chew  . Alcohol use Yes     Comment: rarely   . Drug use: No  . Sexual activity: No   Other Topics Concern  . None   Social History Narrative   Lives with his granddaughter at home.    Daughter lives next door and is his healthcare power of attorney   Experiences dyspnea with minimal activity - sedentary.  Wife died about 2March 24, 2000and   Retired fAirline pilot   Family History  Problem Relation Age of Onset  . Lung disease Father     [name] worked at a cHarrah's EntertainmentMeds: . arformoterol  15 mcg Nebulization BID  . budesonide (PULMICORT) nebulizer solution  0.5 mg Nebulization BID  . furosemide  80 mg Intravenous Once  . heparin subcutaneous  5,000 Units Subcutaneous Q8H  . ipratropium-albuterol  3 mL Nebulization Q6H  . mouth rinse  15 mL Mouth Rinse BID  . metoprolol  2.5 mg Intravenous Q6H  . pantoprazole (PROTONIX) IV  40 mg Intravenous Q12H  . sodium chloride flush  10-40 mL Intracatheter Q12H  . sodium chloride flush  3 mL Intravenous  Q12H   Continuous Infusions: . TPN (CLINIMIX) Adult without lytes     And  . fat emulsion    . TPN (CLINIMIX) Adult without lytes 83 mL/hr at 02/23/17 1745   PRN Meds:.acetaminophen **OR** acetaminophen, albuterol, fentaNYL (SUBLIMAZE) injection, haloperidol lactate, sodium chloride flush Medications Prior to Admission:  Prior to Admission medications   Medication Sig Start Date End Date Taking? Authorizing Provider  aspirin EC 81 MG EC tablet Take 1 tablet (81 mg total) by mouth daily. 09/02/16  Yes DEloise Levels MD  BROVANA 15 MCG/2ML NEBU USE ONE VIAL IN NEBULIZER TWICE DAILY 09/20/16  Yes MTanda Rockers MD  budesonide (PULMICORT)  0.25 MG/2ML nebulizer solution USE ONE VIAL IN NEBULIZER TWICE DAILY 08/04/15  Yes Tanda Rockers, MD  furosemide (LASIX) 40 MG tablet Take 20 mg by mouth daily.    Yes Historical Provider, MD  lovastatin (MEVACOR) 20 MG tablet Take 20 mg by mouth at bedtime.   Yes Historical Provider, MD  metoprolol succinate (TOPROL-XL) 100 MG 24 hr tablet Take 1 tablet (100 mg total) by mouth 2 (two) times daily. 12/12/16  Yes Deboraha Sprang, MD  ranitidine (ZANTAC) 150 MG tablet Take 150 mg by mouth daily.   Yes Historical Provider, MD   No Known Allergies Review of Systems  Unable to perform ROS: Acuity of condition    Physical Exam  Constitutional: He appears well-developed.  Cardiovascular:  Tachycardic, irregular, diffuse edema  Pulmonary/Chest: He has wheezes.  tachypneic  Neurological:  Lethargic, opens eyes, does not answer questions  Skin:  Nose is purple    Vital Signs: BP 92/63 (BP Location: Left Wrist)   Pulse (!) 114   Temp 98 F (36.7 C) (Axillary)   Resp (!) 29   Ht 6' (1.829 m)   Wt 109.2 kg (240 lb 11.9 oz) Comment: Patient was 110.2kg on admission to step down unit  SpO2 100%   BMI 32.65 kg/m  Pain Assessment: No/denies pain POSS *See Group Information*: 1-Acceptable,Awake and alert Pain Score: 0-No pain   SpO2: SpO2: 100 % O2  Device:SpO2: 100 % O2 Flow Rate: .O2 Flow Rate (L/min): 6 L/min  IO: Intake/output summary:  Intake/Output Summary (Last 24 hours) at 2017-03-10 1123 Last data filed at Mar 10, 2017 0370  Gross per 24 hour  Intake             2539 ml  Output             1265 ml  Net             1274 ml    LBM: Last BM Date: 03/10/17 Baseline Weight: Weight: 99.8 kg (220 lb) Most recent weight: Weight: 109.2 kg (240 lb 11.9 oz) (Patient was 110.2kg on admission to step down unit)     Palliative Assessment/Data: PPS: 10%     Thank you for this consult. Palliative medicine will continue to follow and assist as needed.   Time In: 1330 Time Out: 1445 Time Total: 75 minutes Greater than 50%  of this time was spent counseling and coordinating care related to the above assessment and plan.  Signed by: Mariana Kaufman, AGNP-C Palliative Medicine    Please contact Palliative Medicine Team phone at [phone] for questions and concerns.  For individual provider: See Shea Evans

## 2017-03-07 DEATH — deceased

## 2017-03-10 ENCOUNTER — Encounter: Payer: Medicare Other | Admitting: Nurse Practitioner

## 2017-03-18 ENCOUNTER — Other Ambulatory Visit: Payer: Self-pay | Admitting: Nurse Practitioner

## 2017-03-23 ENCOUNTER — Encounter: Payer: Medicare Other | Admitting: Nurse Practitioner

## 2017-03-30 ENCOUNTER — Encounter (HOSPITAL_COMMUNITY): Payer: Self-pay | Admitting: Surgery

## 2017-04-07 NOTE — Discharge Summary (Addendum)
Death Summary  Jesus Sanders [id number] DOB: 05/17/1936 DOA: 2017/03/07  PCP: Leonard Downing, MD   Admit date: Mar 07, 2017 Date of Death: 09-Apr-2017  Final Diagnoses:  Principal Problem:   Bowel perforation (HCC) Active Problems:   CAD (coronary artery disease)   Chronic systolic heart failure (HCC)   Essential hypertension   Ischemic cardiomyopathy   Automatic implantable cardioverter-defibrillator- medtronic   CAP (community acquired pneumonia)   Lactic acidosis   Pressure injury of skin   Wide-complex tachycardia [facility])   Terminal care   Palliative care by specialist   History of present illness:  81 yo male who presented to the hospital with the chief complain of abdominal pain. Acute and severe pain, patient requested to be brought to the hospital. Pain localized at the LUQ. On the initial physical examination patient was found hypotensive 92/68, and tachycardic 101 bpm. Abdomen with significant tenderness. CT of the abdomen with pneumoperitoneum, suggestive perforation from ulcer at the jejunum at the site of prior gastrojejunostomy. Patient was intervened with laparotomy finding perforated marginal ulcer at pre-existing gastrojejunostomy. Patient developed post operative respiratory failure, remained on invasive mechanical ventilation. Extubated on 04/09 to be re-intubated on 04/10. Complicated course with hypotension requiring vasopressors. Positive right lower lobe pneumonia. Atrial fibrilaltion on amiodarone infusion. Worsenign renal failure with AKI. Extubated 04/12, transferred to step down unit on 04/14.   Hospital Course:  1. Acute hypoxic respiratory failure-secondary to pulmonary edema and pneumonia.Patient was started on  BiPAP. Patient was  DO NOT RESUSCITATE so no intubation wasperformed.  2. Right lobar lobe  Pneumonia- patient  completed IV antibiotic regimen. Only  supportive care was provided.No new antibiotics were started, continuous morphine and boluses  were started. 3. Atrial fibrillation- patient was started on amiodarone infusion for atrial fibrillation. Also has been having episodes of VT. Anti-coagulation currently on hold due to recent surgery for perforated viscus. Echo showed EF 40-45%.amiodarone was been discontinued due to persistent hypotension.  4. Acute kidney injury- patient has had gradual worsening of creatinine, with worsening pulmonary edema. Nephrology was consulted and patient was deemed not a  dialysis candidate. Patient was seen by Dr. Hollie Salk who agreed that patient has poor prognosis. 5. Perforated ulcer at the prior gastrojejunostomy- general surgery followed patient in the hospital.  Palliative care discussion- patients family met palliative care and confirmed comfort measures, including DNR, was started on continuous morphine and prn boluses.   Patient expired on 2017/03/20 at 1552    Signed:  [place] 04-09-17, 11:28 AM

## 2017-05-02 IMAGING — DX DG CHEST 2V
2 series · 2 of 2 positions shown · non-contrast
Comparison: Chest x-ray 11/22/2015.

CLINICAL DATA: 79-year-old male with history of shortness of breath
and sweating today.

EXAM:
CHEST  2 VIEW

[chest pa]
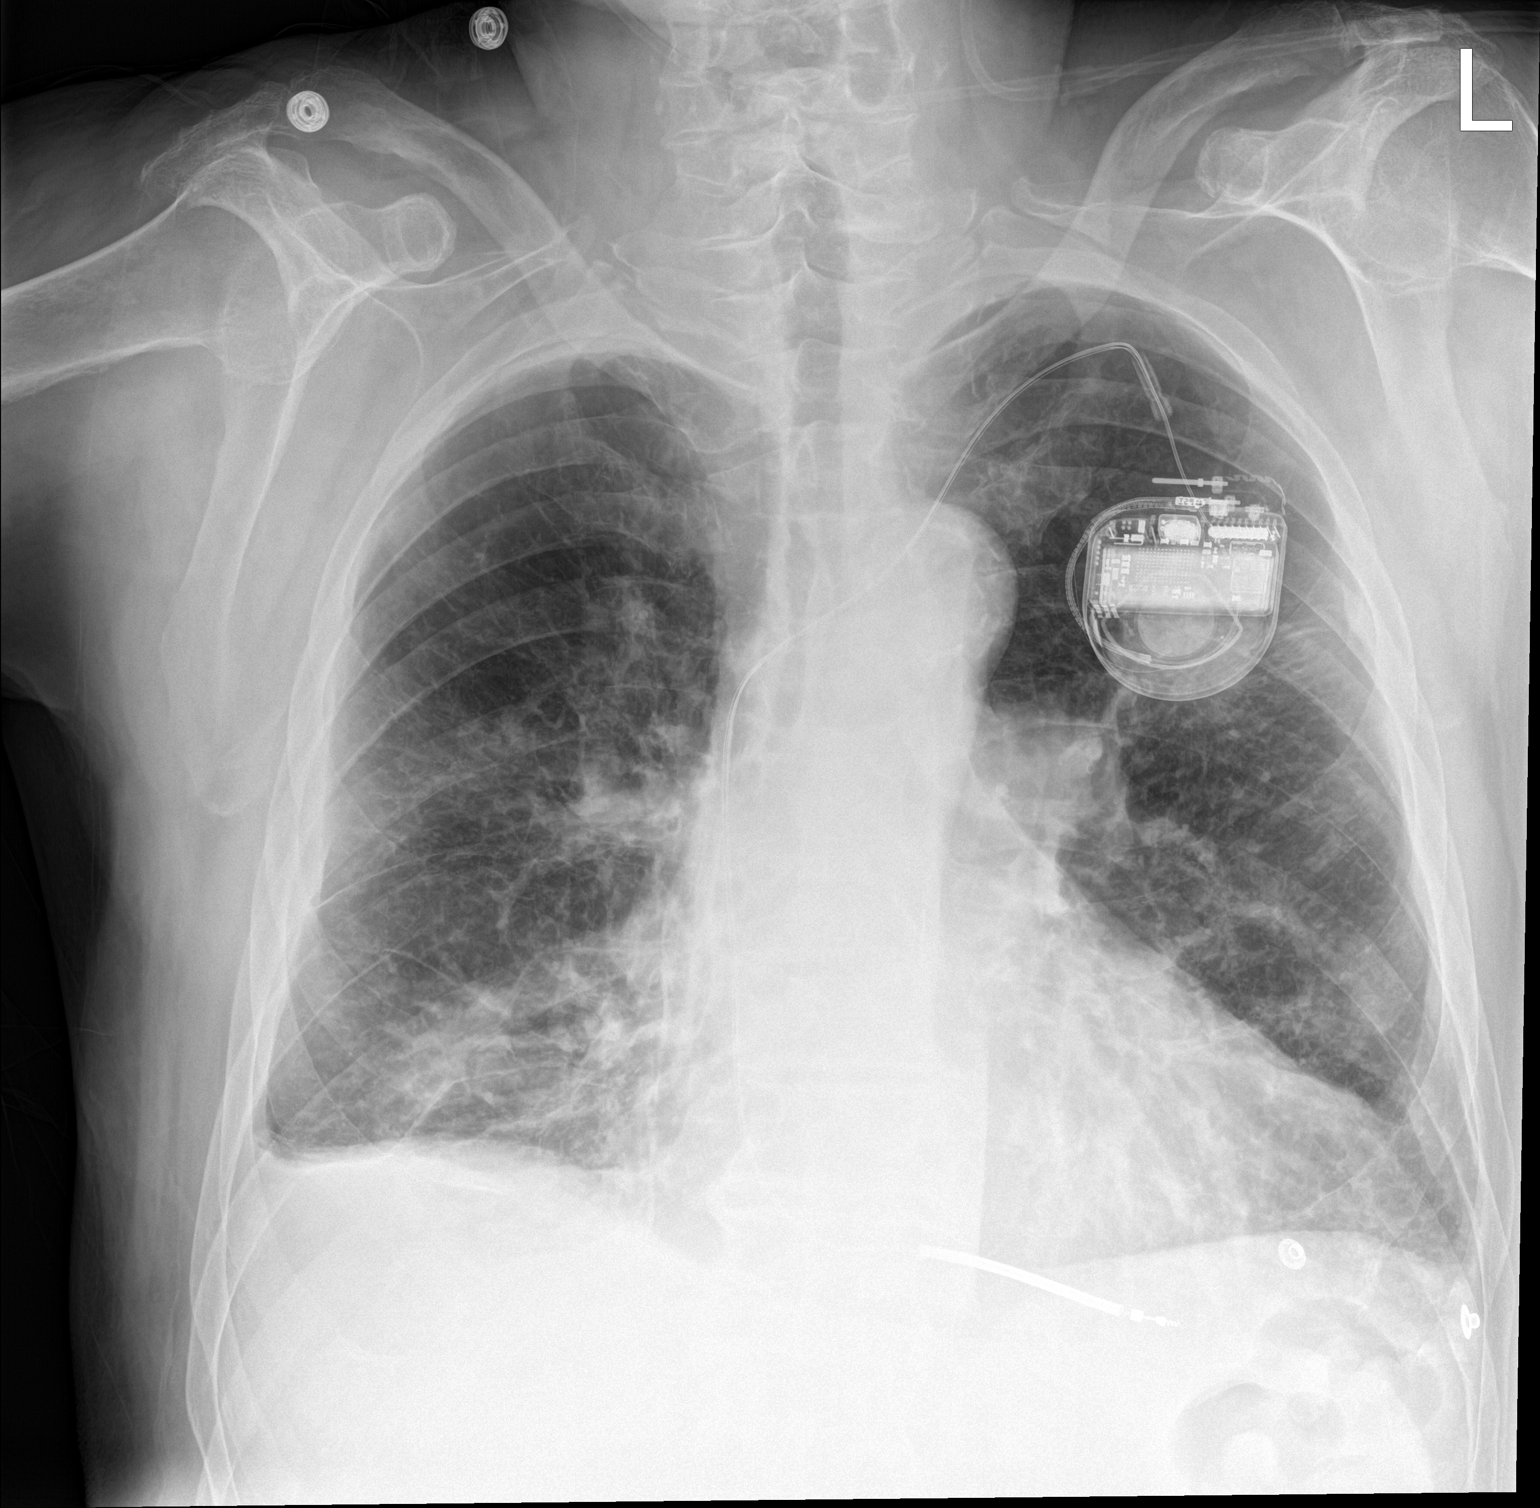

[chest lat]
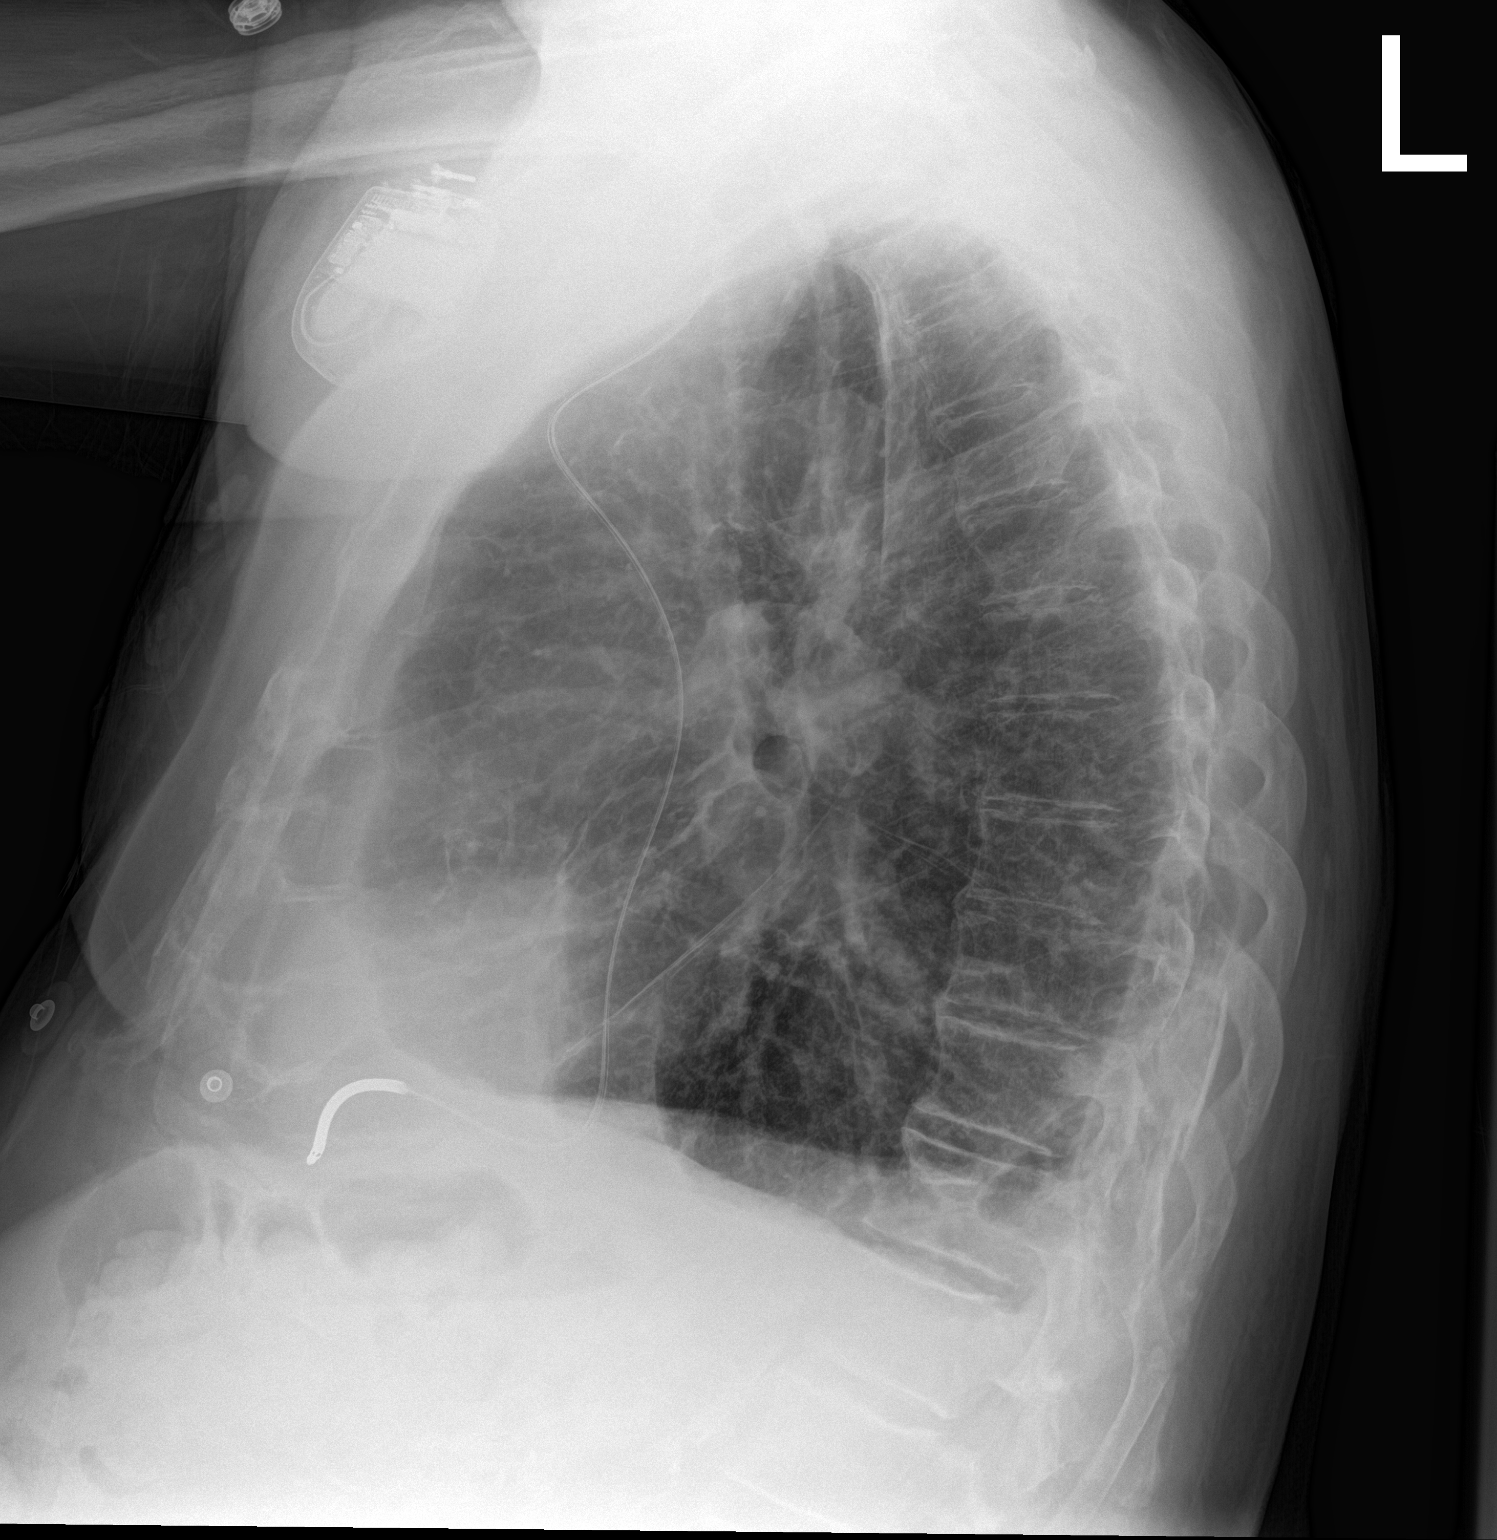

[2 of 2 positions shown; findings below may reference images not displayed]

FINDINGS: Diffuse peribronchial cuffing. Widespread interstitial prominence
throughout the mid to lower lungs bilaterally is similar to prior
examinations and appears to be chronic. Chronic scarring and volume
loss in the right lower lobe, as well as in the inferior segment of
the lingula, similar to prior examinations. No definite acute
consolidative airspace disease. Chronic blunting of the right
costophrenic sulcus appears very related to pleuroparenchymal
scarring. No definite pleural effusions. No evidence of pulmonary
edema. Heart size is within normal limits. Upper mediastinal
contours are normal. Atherosclerosis in the thoracic aorta.
Left-sided pacemaker/AICD with lead tip projecting over the expected
location of the right ventricular apex.
IMPRESSION: 1. Diffuse peribronchial cuffing, slightly more pronounced than
prior examinations, which could suggest an acute bronchitis.
2. Widespread bibasilar scarring, similar to prior examinations,
with chronic volume loss that is most pronounced in the right lower
lobe.
3. Atherosclerosis.

## 2017-05-19 NOTE — Addendum Note (Signed)
Addendum  created 05/19/17 1012 by Nolon Nations, MD   Sign clinical note

## 2017-05-19 NOTE — Anesthesia Postprocedure Evaluation (Signed)
Anesthesia Post Note  Patient: Jesus Sanders  Procedure(s) Performed: Procedure(s) (LRB): REPAIR OF PERFORATED ULCER (N/A)     Anesthesia Post Evaluation  Last Vitals:  Vitals:   2017/03/10 1100 Mar 10, 2017 1133  BP:    Pulse: 66 63  Resp: (!) 31 (!) 33  Temp:      Last Pain:  Vitals:   2017/03/10 1220  TempSrc:   PainSc: [place]

## 2018-05-26 IMAGING — CR DG CHEST 1V PORT
1 series · 1 of 1 positions shown · non-contrast
Comparison: 02/11/2017 CXR

CLINICAL DATA: Endotracheal and central line placement

EXAM:
PORTABLE CHEST 1 VIEW

[AP]
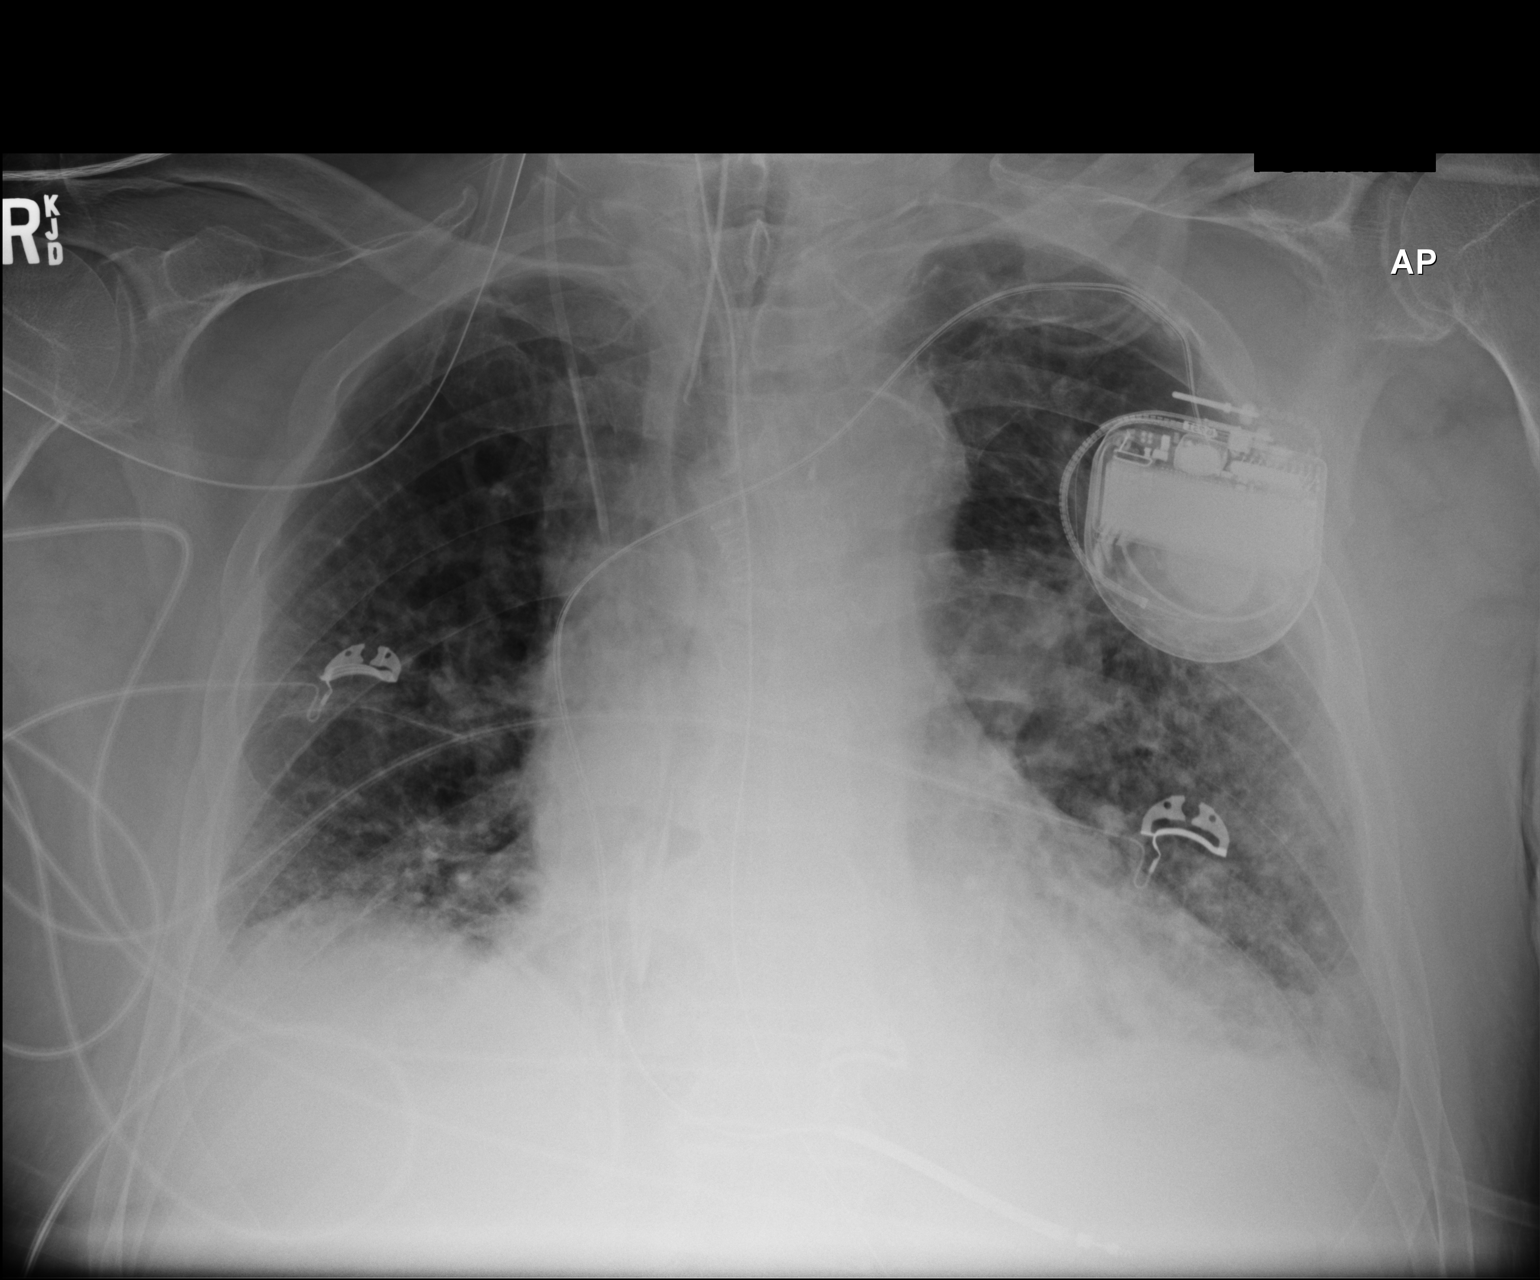

[1 of 1 positions shown; findings below may reference images not displayed]

FINDINGS: Endotracheal tube tip is 4.9 cm above the carina in satisfactory
position. Right IJ central line catheter terminates in the mid SVC
level. Left-sided defibrillator apparatus obscures the spiculated
left upper lobe mass seen previously. Leads project within the right
atrium and right ventricle off the ICD device. Stable cardiomegaly
with aortic atherosclerosis. Central vascular congestion is noted
without pneumothorax. Probable small bilateral pleural effusions.
Retrocardiac opacities likely representing areas of atelectasis of
CHF. Superimposed pneumonia would be difficult to entirely exclude.
IMPRESSION: 1. Satisfactory support line and tube positions. No pneumothorax
post IJ central line catheter placement.
2. Cardiomegaly with aortic atherosclerosis mild central vascular
congestion consistent with CHF. Retrocardiac atelectasis CHF noted.
Superimposed pneumonia would be difficult to entirely exclude. Small
bilateral pleural effusions.
3. Spiculated mass in the left upper lobe is obscured by the ICD
device.

## 2018-06-04 IMAGING — CR DG CHEST 1V PORT
1 series · 1 of 1 positions shown · non-contrast
Comparison: Portable chest x-ray February 19, 2017

CLINICAL DATA: Dyspnea, current smoker, history of CHF and ischemic
cardiomyopathy, COPD

EXAM:
PORTABLE CHEST 1 VIEW

[AP]
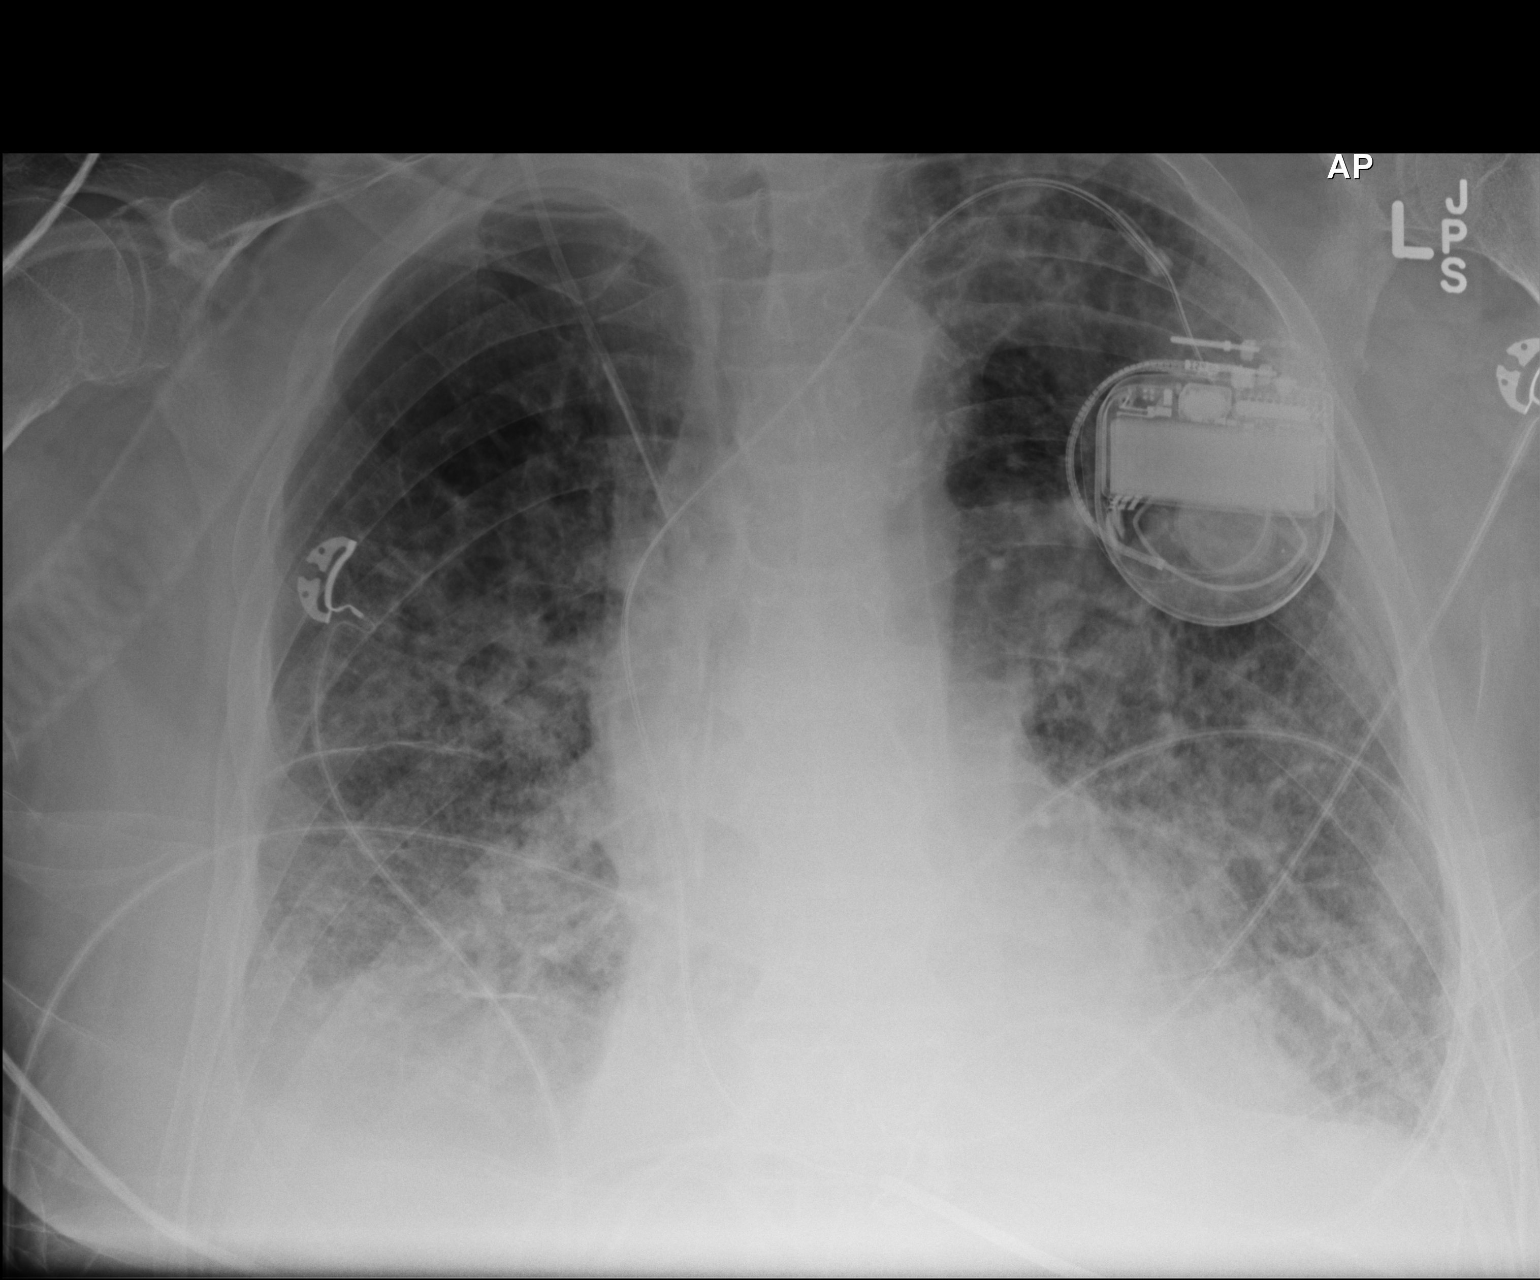

[1 of 1 positions shown; findings below may reference images not displayed]

FINDINGS: The lungs remain well-expanded. The confluent interstitial and
alveolar opacities persist. There small bilateral pleural effusions
greatest on the right. The heart is top-normal in size. The
pulmonary vascularity is indistinct. The ICD is in stable position.
There is calcification in the wall of the thoracic aorta. The right
internal jugular venous catheter tip projects over the proximal SVC.
IMPRESSION: Persistent patchy interstitial and alveolar opacities likely reflect
pneumonia. There may be superimposed CHF though the pulmonary
vascularity does not exhibit significant cephalization. Small
bilateral pleural effusions. There has not been significant interval
change since yesterday's study.

Thoracic aortic atherosclerosis.
